# Patient Record
Sex: Female | Born: 1940 | Race: White | Hispanic: No | Marital: Married | Smoking: Former smoker
Health system: Southern US, Community
[De-identification: ages and names within clinical notes are randomized; demographics above are authoritative.]

## PROBLEM LIST (undated history)

## (undated) DIAGNOSIS — R232 Flushing: Secondary | ICD-10-CM

## (undated) DIAGNOSIS — Z96659 Presence of unspecified artificial knee joint: Secondary | ICD-10-CM

## (undated) DIAGNOSIS — Z9889 Other specified postprocedural states: Secondary | ICD-10-CM

## (undated) DIAGNOSIS — J189 Pneumonia, unspecified organism: Secondary | ICD-10-CM

## (undated) DIAGNOSIS — D649 Anemia, unspecified: Secondary | ICD-10-CM

## (undated) DIAGNOSIS — E785 Hyperlipidemia, unspecified: Secondary | ICD-10-CM

## (undated) DIAGNOSIS — C3490 Malignant neoplasm of unspecified part of unspecified bronchus or lung: Secondary | ICD-10-CM

## (undated) DIAGNOSIS — Z96649 Presence of unspecified artificial hip joint: Secondary | ICD-10-CM

## (undated) DIAGNOSIS — F419 Anxiety disorder, unspecified: Secondary | ICD-10-CM

## (undated) DIAGNOSIS — J302 Other seasonal allergic rhinitis: Secondary | ICD-10-CM

## (undated) DIAGNOSIS — M1990 Unspecified osteoarthritis, unspecified site: Secondary | ICD-10-CM

## (undated) DIAGNOSIS — K8020 Calculus of gallbladder without cholecystitis without obstruction: Secondary | ICD-10-CM

## (undated) DIAGNOSIS — H269 Unspecified cataract: Secondary | ICD-10-CM

## (undated) DIAGNOSIS — D7589 Other specified diseases of blood and blood-forming organs: Secondary | ICD-10-CM

## (undated) DIAGNOSIS — F17200 Nicotine dependence, unspecified, uncomplicated: Secondary | ICD-10-CM

## (undated) DIAGNOSIS — M8580 Other specified disorders of bone density and structure, unspecified site: Secondary | ICD-10-CM

## (undated) DIAGNOSIS — Z8669 Personal history of other diseases of the nervous system and sense organs: Secondary | ICD-10-CM

## (undated) DIAGNOSIS — K589 Irritable bowel syndrome without diarrhea: Secondary | ICD-10-CM

## (undated) DIAGNOSIS — F329 Major depressive disorder, single episode, unspecified: Secondary | ICD-10-CM

## (undated) DIAGNOSIS — I10 Essential (primary) hypertension: Secondary | ICD-10-CM

## (undated) DIAGNOSIS — B019 Varicella without complication: Secondary | ICD-10-CM

## (undated) DIAGNOSIS — K7689 Other specified diseases of liver: Secondary | ICD-10-CM

## (undated) DIAGNOSIS — R112 Nausea with vomiting, unspecified: Secondary | ICD-10-CM

## (undated) DIAGNOSIS — J449 Chronic obstructive pulmonary disease, unspecified: Secondary | ICD-10-CM

## (undated) DIAGNOSIS — Z9289 Personal history of other medical treatment: Secondary | ICD-10-CM

## (undated) DIAGNOSIS — F32A Depression, unspecified: Secondary | ICD-10-CM

## (undated) HISTORY — DX: Hyperlipidemia, unspecified: E78.5

## (undated) HISTORY — DX: Nicotine dependence, unspecified, uncomplicated: F17.200

## (undated) HISTORY — DX: Depression, unspecified: F32.A

## (undated) HISTORY — PX: COLONOSCOPY: SHX174

## (undated) HISTORY — PX: DILATION AND CURETTAGE OF UTERUS: SHX78

## (undated) HISTORY — DX: Personal history of other medical treatment: Z92.89

## (undated) HISTORY — DX: Presence of unspecified artificial hip joint: Z96.649

## (undated) HISTORY — DX: Calculus of gallbladder without cholecystitis without obstruction: K80.20

## (undated) HISTORY — DX: Other seasonal allergic rhinitis: J30.2

## (undated) HISTORY — DX: Varicella without complication: B01.9

## (undated) HISTORY — DX: Malignant neoplasm of unspecified part of unspecified bronchus or lung: C34.90

## (undated) HISTORY — DX: Chronic obstructive pulmonary disease, unspecified: J44.9

## (undated) HISTORY — DX: Anxiety disorder, unspecified: F41.9

## (undated) HISTORY — DX: Presence of unspecified artificial knee joint: Z96.659

## (undated) HISTORY — DX: Unspecified osteoarthritis, unspecified site: M19.90

## (undated) HISTORY — PX: CARPAL TUNNEL RELEASE: SHX101

## (undated) HISTORY — PX: OTHER SURGICAL HISTORY: SHX169

## (undated) HISTORY — DX: Major depressive disorder, single episode, unspecified: F32.9

## (undated) HISTORY — DX: Anemia, unspecified: D64.9

## (undated) HISTORY — PX: TONSILLECTOMY: SUR1361

## (undated) HISTORY — DX: Other specified diseases of blood and blood-forming organs: D75.89

## (undated) HISTORY — DX: Essential (primary) hypertension: I10

---

## 1997-10-18 ENCOUNTER — Ambulatory Visit (HOSPITAL_COMMUNITY): Admission: RE | Admit: 1997-10-18 | Discharge: 1997-10-18 | Payer: Self-pay | Admitting: Family Medicine

## 1998-08-09 HISTORY — PX: INGUINAL HERNIA REPAIR: SHX194

## 1999-08-10 HISTORY — PX: ROTATOR CUFF REPAIR: SHX139

## 2001-01-19 ENCOUNTER — Encounter: Payer: Self-pay | Admitting: Geriatric Medicine

## 2001-01-19 ENCOUNTER — Encounter: Admission: RE | Admit: 2001-01-19 | Discharge: 2001-01-19 | Payer: Self-pay | Admitting: Geriatric Medicine

## 2001-07-03 ENCOUNTER — Encounter: Payer: Self-pay | Admitting: Obstetrics and Gynecology

## 2001-07-03 ENCOUNTER — Encounter: Admission: RE | Admit: 2001-07-03 | Discharge: 2001-07-03 | Payer: Self-pay | Admitting: Obstetrics and Gynecology

## 2001-07-05 ENCOUNTER — Encounter: Admission: RE | Admit: 2001-07-05 | Discharge: 2001-07-05 | Payer: Self-pay | Admitting: Obstetrics and Gynecology

## 2001-07-05 ENCOUNTER — Encounter: Payer: Self-pay | Admitting: Obstetrics and Gynecology

## 2001-08-09 HISTORY — PX: TOTAL ABDOMINAL HYSTERECTOMY: SHX209

## 2001-08-30 ENCOUNTER — Ambulatory Visit (HOSPITAL_COMMUNITY): Admission: RE | Admit: 2001-08-30 | Discharge: 2001-08-30 | Payer: Self-pay | Admitting: Obstetrics and Gynecology

## 2002-02-13 ENCOUNTER — Encounter: Admission: RE | Admit: 2002-02-13 | Discharge: 2002-02-13 | Payer: Self-pay | Admitting: Obstetrics and Gynecology

## 2002-02-13 ENCOUNTER — Encounter: Payer: Self-pay | Admitting: Obstetrics and Gynecology

## 2002-07-19 ENCOUNTER — Inpatient Hospital Stay (HOSPITAL_COMMUNITY): Admission: RE | Admit: 2002-07-19 | Discharge: 2002-07-21 | Payer: Self-pay | Admitting: Obstetrics and Gynecology

## 2002-07-19 ENCOUNTER — Encounter (INDEPENDENT_AMBULATORY_CARE_PROVIDER_SITE_OTHER): Payer: Self-pay | Admitting: Specialist

## 2003-04-04 ENCOUNTER — Ambulatory Visit (HOSPITAL_COMMUNITY): Admission: RE | Admit: 2003-04-04 | Discharge: 2003-04-04 | Payer: Self-pay | Admitting: Obstetrics and Gynecology

## 2003-04-04 ENCOUNTER — Encounter: Payer: Self-pay | Admitting: Obstetrics and Gynecology

## 2008-05-13 ENCOUNTER — Encounter: Admission: RE | Admit: 2008-05-13 | Discharge: 2008-05-13 | Payer: Self-pay | Admitting: Family Medicine

## 2008-05-30 ENCOUNTER — Ambulatory Visit (HOSPITAL_COMMUNITY): Admission: RE | Admit: 2008-05-30 | Discharge: 2008-05-30 | Payer: Self-pay | Admitting: Family Medicine

## 2009-01-09 ENCOUNTER — Encounter: Admission: RE | Admit: 2009-01-09 | Discharge: 2009-01-09 | Payer: Self-pay | Admitting: Gastroenterology

## 2009-02-13 ENCOUNTER — Encounter: Admission: RE | Admit: 2009-02-13 | Discharge: 2009-03-04 | Payer: Self-pay | Admitting: Sports Medicine

## 2009-06-09 ENCOUNTER — Encounter: Admission: RE | Admit: 2009-06-09 | Discharge: 2009-06-09 | Payer: Self-pay | Admitting: Surgery

## 2009-06-17 ENCOUNTER — Encounter: Admission: RE | Admit: 2009-06-17 | Discharge: 2009-06-17 | Payer: Self-pay | Admitting: Surgery

## 2009-07-29 ENCOUNTER — Encounter: Admission: RE | Admit: 2009-07-29 | Discharge: 2009-07-29 | Payer: Self-pay | Admitting: Sports Medicine

## 2009-08-09 HISTORY — PX: TOTAL HIP ARTHROPLASTY: SHX124

## 2009-09-09 ENCOUNTER — Inpatient Hospital Stay (HOSPITAL_COMMUNITY): Admission: RE | Admit: 2009-09-09 | Discharge: 2009-09-13 | Payer: Self-pay | Admitting: Orthopaedic Surgery

## 2009-09-22 ENCOUNTER — Ambulatory Visit: Payer: Self-pay | Admitting: Vascular Surgery

## 2009-09-22 ENCOUNTER — Ambulatory Visit: Admission: RE | Admit: 2009-09-22 | Discharge: 2009-09-22 | Payer: Self-pay | Admitting: Orthopaedic Surgery

## 2009-09-22 ENCOUNTER — Encounter (INDEPENDENT_AMBULATORY_CARE_PROVIDER_SITE_OTHER): Payer: Self-pay | Admitting: Orthopaedic Surgery

## 2009-10-13 ENCOUNTER — Encounter: Admission: RE | Admit: 2009-10-13 | Discharge: 2009-10-23 | Payer: Self-pay | Admitting: Orthopaedic Surgery

## 2010-02-06 ENCOUNTER — Encounter: Admission: RE | Admit: 2010-02-06 | Discharge: 2010-02-06 | Payer: Self-pay | Admitting: Surgery

## 2010-06-05 ENCOUNTER — Ambulatory Visit (HOSPITAL_COMMUNITY): Admission: RE | Admit: 2010-06-05 | Discharge: 2010-06-05 | Payer: Self-pay | Admitting: Family Medicine

## 2010-06-22 ENCOUNTER — Encounter: Admission: RE | Admit: 2010-06-22 | Discharge: 2010-06-22 | Payer: Self-pay | Admitting: Family Medicine

## 2010-08-30 ENCOUNTER — Encounter: Payer: Self-pay | Admitting: Surgery

## 2010-10-25 LAB — CROSSMATCH
ABO/RH(D): A POS
Antibody Screen: NEGATIVE

## 2010-10-25 LAB — DIFFERENTIAL
Basophils Relative: 1 % (ref 0–1)
Eosinophils Absolute: 0 10*3/uL (ref 0.0–0.7)
Lymphs Abs: 1.6 10*3/uL (ref 0.7–4.0)
Monocytes Relative: 7 % (ref 3–12)
Neutro Abs: 4.9 10*3/uL (ref 1.7–7.7)
Neutrophils Relative %: 69 % (ref 43–77)

## 2010-10-25 LAB — ABO/RH: ABO/RH(D): A POS

## 2010-10-25 LAB — URINALYSIS, ROUTINE W REFLEX MICROSCOPIC
Hgb urine dipstick: NEGATIVE
Nitrite: NEGATIVE
Protein, ur: 30 mg/dL — AB
Urobilinogen, UA: 1 mg/dL (ref 0.0–1.0)

## 2010-10-25 LAB — APTT: aPTT: 27 seconds (ref 24–37)

## 2010-10-25 LAB — COMPREHENSIVE METABOLIC PANEL
ALT: 39 U/L — ABNORMAL HIGH (ref 0–35)
BUN: 6 mg/dL (ref 6–23)
CO2: 25 mEq/L (ref 19–32)
Calcium: 8.8 mg/dL (ref 8.4–10.5)
GFR calc non Af Amer: >60 mL/min (ref >60)
Glucose, Bld: 107 mg/dL — ABNORMAL HIGH (ref 70–99)
Sodium: 131 mEq/L — ABNORMAL LOW (ref 135–145)
Total Protein: 6.8 g/dL (ref 6.0–8.3)

## 2010-10-25 LAB — CBC
HCT: 42.8 % (ref 36.0–46.0)
Hemoglobin: 15 g/dL (ref 12.0–15.0)
MCHC: 35.1 g/dL (ref 30.0–36.0)
MCV: 96.9 fL (ref 78.0–100.0)
RBC: 4.42 MIL/uL (ref 3.87–5.11)
RDW: 13.1 % (ref 11.5–15.5)

## 2010-10-25 LAB — PROTIME-INR
INR: 0.99 (ref 0.00–1.49)
Prothrombin Time: 13 seconds (ref 11.6–15.2)

## 2010-10-28 LAB — BASIC METABOLIC PANEL
BUN: 4 mg/dL — ABNORMAL LOW (ref 6–23)
CO2: 27 mEq/L (ref 19–32)
CO2: 27 mEq/L (ref 19–32)
Calcium: 7.8 mg/dL — ABNORMAL LOW (ref 8.4–10.5)
Chloride: 100 mEq/L (ref 96–112)
Chloride: 103 mEq/L (ref 96–112)
Chloride: 98 mEq/L (ref 96–112)
GFR calc Af Amer: >60 mL/min (ref >60)
GFR calc non Af Amer: >60 mL/min (ref >60)
Glucose, Bld: 100 mg/dL — ABNORMAL HIGH (ref 70–99)
Potassium: 3.8 mEq/L (ref 3.5–5.1)
Potassium: 4.1 mEq/L (ref 3.5–5.1)
Sodium: 131 mEq/L — ABNORMAL LOW (ref 135–145)
Sodium: 132 mEq/L — ABNORMAL LOW (ref 135–145)
Sodium: 134 mEq/L — ABNORMAL LOW (ref 135–145)

## 2010-10-28 LAB — CBC
HCT: 29.5 % — ABNORMAL LOW (ref 36.0–46.0)
HCT: 32.3 % — ABNORMAL LOW (ref 36.0–46.0)
Hemoglobin: 10.1 g/dL — ABNORMAL LOW (ref 12.0–15.0)
Hemoglobin: 10.7 g/dL — ABNORMAL LOW (ref 12.0–15.0)
Hemoglobin: 11.1 g/dL — ABNORMAL LOW (ref 12.0–15.0)
MCHC: 34.4 g/dL (ref 30.0–36.0)
MCHC: 34.5 g/dL (ref 30.0–36.0)
MCV: 97.2 fL (ref 78.0–100.0)
MCV: 97.8 fL (ref 78.0–100.0)
Platelets: 183 10*3/uL (ref 150–400)
RBC: 3.19 MIL/uL — ABNORMAL LOW (ref 3.87–5.11)
RDW: 12.8 % (ref 11.5–15.5)
WBC: 10.1 10*3/uL (ref 4.0–10.5)
WBC: 12.4 10*3/uL — ABNORMAL HIGH (ref 4.0–10.5)

## 2010-10-28 LAB — PROTIME-INR
INR: 1.54 — ABNORMAL HIGH (ref 0.00–1.49)
INR: 1.75 — ABNORMAL HIGH (ref 0.00–1.49)
Prothrombin Time: 20.3 seconds — ABNORMAL HIGH (ref 11.6–15.2)

## 2010-10-28 LAB — GLUCOSE, CAPILLARY

## 2010-12-24 ENCOUNTER — Ambulatory Visit
Admission: RE | Admit: 2010-12-24 | Discharge: 2010-12-24 | Disposition: A | Discharge status: Home / Self Care | Payer: Medicare Other | Source: Ambulatory Visit | Attending: Ophthalmology | Admitting: Ophthalmology

## 2010-12-24 ENCOUNTER — Other Ambulatory Visit: Payer: Self-pay | Admitting: Ophthalmology

## 2010-12-24 DIAGNOSIS — D869 Sarcoidosis, unspecified: Secondary | ICD-10-CM

## 2010-12-25 NOTE — Discharge Summary (Signed)
NAME:  [name], [name]                         ACCOUNT NO.:  [id number]   MEDICAL RECORD NO.:  [id number]                   PATIENT TYPE:  INP   LOCATION:  0457                                 FACILITY:  [facility]   PHYSICIAN:  Artist Pais, M.D.                 DATE OF BIRTH:  1940-10-05   DATE OF ADMISSION:  07/19/2002  DATE OF DISCHARGE:  07/21/2002                                 DISCHARGE SUMMARY   HISTORY OF PRESENT ILLNESS:  The patient is a 70 year old Caucasian female  who was admitted for total abdominal hysterectomy and bilateral salpingo-  oophorectomy for a known fibroid approximately 6 cm in diameter.  She  continued to have irregular bleeding which is post-menopausal bleeding which  has been worked up with a D&C hysteroscopy, and she was found to have an  endometrial polyp with subsequent resolution of her bleeding.  However,  subsequently she began to have post-menopausal bleeding again, and she  desired total abdominal hysterectomy and bilateral salpingo-oophorectomy due  to the large fibroid.  She was concerned that her ovary could not be  definitively identified, and she had been desiring removal of the fibroid  uterus for quite some time, but has been unable to undergo the surgery due  to constraints of her job.  For further details, please see the dictated  history and physical.   HOSPITAL COURSE:  The patient was admitted and subsequently underwent a  total abdominal hysterectomy and bilateral salpingo-oophorectomy.  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  She also was found to have a small liver nodule as well.  Postoperatively,  the patient was receiving excellent analgesia and had excellent urine  output.  She continued to do well, and subsequently was discharged home on  postoperative day #2.  She was found to have no CVA tenderness and no calf  tenderness.  Incision was noted to be clean, dry, and intact with some  slight bruising.  She was given extensive discharge instructions, and did  have return of her bowel function with positive flatus, and she was able to  tolerate a regular diet.  She was instructed that we would work up the liver  nodule later.  She was given a prescription for Tylox one or two q.3-4h.  p.r.n. pain, to take ibuprofen 400 to 600 mg q.6h. p.r.n. pain.  She was  urged to hold her Norvasc for now, and to call her blood pressures to [place] in  two days at the office to restart her antihypertensive agent.  She was also  urged to continue her Vivelle Dot 0.05 mg twice weekly, and was given a one  month supply with four refills.  She did well with her nicotine patch in the  hospital, and was given this.  She was urged to  return in a week for staple  removal, and to call for any problems.                                               Artist Pais, M.D.    DC/MEDQ  D:  08/12/2002  T:  08/12/2002  Job:  [id number]   cc:   Hal T. Stoneking, M.D.  [address] [place], [place] [address]  Fax: [phone]

## 2010-12-25 NOTE — Op Note (Signed)
NAMEMarland Kitchen  [name], [name]                         ACCOUNT NO.:  [id number]   MEDICAL RECORD NO.:  [id number]                   PATIENT TYPE:  INP   LOCATION:  X003                                 FACILITY:  [facility]   PHYSICIAN:  Artist Pais, M.D.                 DATE OF BIRTH:  1941-07-21   DATE OF PROCEDURE:  07/19/2002  DATE OF DISCHARGE:                                 OPERATIVE REPORT   PREOPERATIVE DIAGNOSES:  1. Left adnexal mass thought to be uterine fibroid.  2. Postmenopausal bleeding.   POSTOPERATIVE DIAGNOSES:  1. Postmenopausal bleeding.  2. 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   PROCEDURE:  Total abdominal hysterectomy, bilateral salpingo-oophorectomy.   SURGEON:  Artist Pais, M.D.   ASSISTANT:  Georgina Peer, M.D.   ANESTHESIA:  General endotracheal.   FLUIDS REPLACED:  1700 cc crystalloids.   ANTIBIOTICS:  Ciprofloxacin preoperative antibiotics.   DRAINS:  Foley.   COMPLICATIONS:  None.   ESTIMATED BLOOD LOSS:  150 cc.   FINDINGS:  No fibroid noted.  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   DESCRIPTION OF PROCEDURE:  The patient was brought to the operating room,  identified on the operating room table.  After induction of adequate general  endotracheal anesthesia, the patient was placed in the supine position and  prepped and draped in the usual sterile fashion.  A Foley catheter was  placed.  A small Pfannenstiel incision was made and carried down to the  fascia.  The fascia was scored in the midline, extended bilaterally using  the Mayo scissors.  This was then separated free from the underlying  muscles.  The muscles were separated in the midline down to the symphysis.  The patient was placed in Trendelenburg and the perineum was entered sharply  and carefully, taking care to avoid bowel or other abdominal contents.  The  washings were taken, and the abdomen was explored.  There  was noted to be a  solitary nodule over the right anterior surface of the liver, otherwise  totally negative.  There were no enlarged pelvic or para-aortic nodes.  There was noted to be a small uterus, and the adnexal mass was found to be a  firm, large ovarian mass.  The O'Connor-O'Sullivan retractor was placed, the  bladder blade was placed, and the bowel was packed out of the operative  field using three wet packs, and an abdominal wall retractor was placed.  Kelly clamps were placed at either angle of the uterus, and the left round  ligament was ligated with a figure-of-eight suture of 0 Vicryl, divided, and  the anterior leaf of the broad ligament was divided to the area overlying  the internal os.  A clear space was developed and the infundibulopelvic  ligament was clamped with a curved Heaney clamp, cut, tied with a  free tie,  then a suture ligature of 0 Vicryl.  A Kelly clamp was placed across the  proximal portion of the fallopian tube and the utero-ovarian ligament, which  was then cut, removed, and sent to pathology for frozen section, and this  was tied with a free tie of 0 Vicryl.  In a similar fashion the right round  ligament was ligated with a figure-of-eight suture of 0 Vicryl, divided with  cautery, and then taken down with Metzenbaum scissors.  The anterior leaf of  the broad ligament was taken down to the area overlying the internal os.  The clear space was developed and the infundibulopelvic ligament on the  right was clamped, cut, and tied with a free tie, then a suture ligature of  0 Vicryl.  The ureters were easily identified and noted to be well away from  all pedicles.  Subsequently the vessels were skeletonized on the right.  The  bladder was taken down with both sharp and blunt dissection.  A curved  Heaney was placed across the uterine vessels on the right.  An additional  clamp was placed for back bleeding.  This pedicle was then cut and suture  ligated with a  figure-of-eight suture of 0 Vicryl.  This was accomplished on  the left in a similar fashion.  Subsequent cardinal ligament-uterosacral  ligament bites were taken using straight Heaneys with the pedicles clamped,  cut, and suture ligated using sutures of 0 Vicryl.  The uterosacral ligament  on the left was clamped with a curved Heaney, cut, and the vagina was then  entered.  A figure-of-eight suture was placed of 0 Vicryl.  The cardinal  ligament complex was clamped on the right with a straight Heaney, cut, and  suture ligated using a suture of 0 Vicryl.  Subsequently a curved Heaney was  placed across the uterosacral ligament, which was then cut and suture  ligated using a figure-of-eight suture of 0 Vicryl as the vagina was then  entered.  Using Satinsky scissors, the cervix was separated from the vagina  and the entire specimen was sent to pathology for examination.  At that  point Dr. Almyra Free called to notify us that this was a Darnelle Bos cell tumor,  which is, fortunately, a benign tumor.  Subsequently figure-of-eight sutures  were placed of 0 Vicryl to close the vaginal cuff.  Using an Asepto syringe,  the pedicles were all irrigated with warm lactated Ringer's.  There was  noted to be an oozing area at the cardinal ligament complex on the right.  This was suture ligated with a superficial suture of 2-0 Vicryl on a GI  needle with excellent hemostasis noted.  The pelvis was then copiously  irrigated with warm lactated Ringer's and excellent hemostasis was again  noted at all cut surfaces and all pedicles.  At that point the patient was  taken out of Trendelenburg, the packs were removed, the O'Connor-O'Sullivan  retractor was removed as well as the bladder retractor and the abdominal  wall retractor, and it also should be noted that the appendix was noted to  be normal as well.  Kelly clamps were placed on the peritoneum.  The  peritoneum was closed using simple running suture of 2-0  Vicryl.  The subfascial areas were examined for evidence of hemostasis, excellent  hemostasis achieved using cautery.  The fascia was closed using two sutures  of 0 PDS with each suture anchored at either end of the incision and run to  the midline, and tied.  The subcutaneous tissue was then irrigated copiously  with warm  lactated Ringer's and excellent hemostasis achieved using cautery.  The skin  was closed with staples, and Steri-Strips were applied.  The patient  tolerated the procedure well without apparent complications, was transferred  to the recovery room in stable condition after all instrument, sponge, and  needle counts were correct.                                                 Artist Pais, M.D.    DC/MEDQ  D:  07/19/2002  T:  07/19/2002  Job:  [id number]   cc:   Hal T. Stoneking, M.D.  [address]  [place], [place] [address]  Fax: [phone]   Georgina Peer, M.D.  [phone]  [place]  [place] [address]  Fax: [phone]

## 2010-12-25 NOTE — H&P (Signed)
NAME:  [name], [name]                         ACCOUNT NO.:  [id number]   MEDICAL RECORD NO.:  [id number]                   PATIENT TYPE:  INP   LOCATION:  NA                                   [facility]ACILITY[facility]   PHYSICIAN:  Artist Pais, M.D.                 DATE OF BIRTH:  [birth year]   DATE OF ADMISSION:  07/19/2002  DATE OF DISCHARGE:                                HISTORY & PHYSICAL   CURRENT HISTORY:  The patient is a 70 year old Caucasian female initially  referred July 04, 2001 by Dr. Ann Maki T. Stoneking for postmenopausal  bleeding.  She became menopausal at age 35.  She is a gravida 2, para 1.  She had a uterine polyp removed in November of 2000 in [place] and began  having vaginal bleeding, June 11, 1999 to June 20, 1999.  She  subsequently underwent a D&C/hysteroscopy for an endometrial polyp with  subsequent resolution of her bleeding.  However, the patient has a known  fibroid, approximately 6 cm in diameter, with some continued irregular  bleeding.  It is thought that the irregular bleeding is likely due to the  fibroid uterus, as her pathology was benign, deemed to hysteroscopy.  She  desires total abdominal hysterectomy and bilateral salpingo-oophorectomy due  to the large fibroid.  She is concerned that her ovary cannot be  definitively identified and has been desiring removal of this fibroid uterus  for quite some time but has been unable to undergo the surgery due to the  constraints of her job.  She recently has been diagnosed with lactose  intolerance and found to have irritable bowel syndrome.  Risks of surgery  including anesthetic complication, hemorrhage or infection, damage to  adjacent structures including bladder, bowel, blood vessels or ureter were  discussed with the patient.  She was made aware that with her large fibroid,  damage to adjacent structures was much more likely and for that reason, she  has undergone a bowel prep.  Postoperative  risks were discussed with the  patient to include wound infection, urinary tract infection, atelectasis,  pneumonia, DVT which could result in stroke or pulmonary embolism.  The  patient is admitted for a TAH/BSO due to the patient's concern that the  large fibroid interferes with evaluation of her adnexa as this has been  found to be a left adnexal mass that is a probable uterine fibroid but  ovaries cannot be palpated.  In addition, she continues to have some  episodes of postmenopausal bleeding thought to be due to the fibroid.   PAST MEDICAL HISTORY:  Left retinal hemorrhage, history of endometrial  polyps, depression, vasomotor symptoms, hypertension, arthritis,  fibromyalgia, seasonal allergies and history of pneumonia, December of 1999,  hospitalized in [place], most recently diagnosed with irritable bowel  syndrome.   ALLERGIES:  PENICILLIN and SULFA cause rash or hives; the patient is also  allergic to  RED DYE.   CURRENT MEDICATIONS:  Norvasc, Tylenol, Allegra, Prozac, a muscle relaxant  and Lactaid.   PAST SURGICAL HISTORY:  Rotator cuff surgery, April of 2001, [facility], November  of 2000, hernia repair, May of 2000, and carpal tunnel release in the mid-  1990s.   FAMILY HISTORY:  There is no family history of colon, breast, ovarian or  prostate cancer.  The patient's mother died at 74.  Father died at 33 of  hypertension and heart disease.  One brother, age 78, with hypertension.  No  children.  There is a family history of Crohn's disease in a paternal cousin  and also a family history of diverticular disease.  The patient's father  also manifested diverticular disease.   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  She is married.  She smokes 1 pack of cigarettes per day for  the last 46 years, uses occasionally wine.  She is a native of [place]   REVIEW OF SYSTEMS:  Review of systems noncontributory except as noted  above.  Denies headaches, visual changes, chest pain, shortness of breath, abdominal  pain, unintentional weight loss, dysuria, urgency, frequency, vaginal  __________ or discharge, pain or bleeding with intercourse.  Recently, the  patient has had chronic diarrhea which has been completely worked up.   PHYSICAL EXAMINATION:  GENERAL APPEARANCE:  Well-developed Caucasian female.  VITAL SIGNS:  Blood pressure 110/70.  Weight 134.  HEENT:  Normal.  NECK:  Neck supple without thyromegaly, adenopathy or nodules.  LUNGS:  Lungs clear to auscultation.  CARDIAC:  Regular rate and rhythm.  ABDOMEN:  Abdomen soft, nontender.  No hepatosplenomegaly or masses.  EXTREMITIES:  No cyanosis, clubbing or edema.  NEUROLOGIC:  Oriented x3.  Grossly normal.  PELVIC:  Normal external female genitalia.  No vulvovaginal or cervical  lesions.  Pap smear performed a year ago showed benign reactive changes.  Bimanual examination reveals the uterus to be mobile and nontender with a  large fibroid palpated.  RECTAL:  Excellent sphincter tone.  Confirms pelvic exam.  No masses  palpated.   IMPRESSION AND PLAN:  The patient is a 70 year old Caucasian female with  episodes of postmenopausal bleeding completely worked up with benign  pathology, with a 6-cm left adnexal mass which is likely to be a uterine  fibroid.  This has not changed in measurements, but the patient desires to  have this performed as she is concerned that it interferes with evaluation  of the left adnexa.  In addition, this is thought to be submucosal, as she  continues to have episodes of postmenopausal bleeding.  Risks of the surgery  have been explained to the patient and she expresses understanding of and  acceptance of these risks and desires to proceed with surgery.                                               Artist Pais, M.D.   DC/MEDQ  D:  07/18/2002  T:  07/19/2002  Job:  [id number]

## 2011-04-09 ENCOUNTER — Other Ambulatory Visit: Payer: Self-pay | Admitting: Family Medicine

## 2011-04-09 DIAGNOSIS — K7689 Other specified diseases of liver: Secondary | ICD-10-CM

## 2011-04-14 ENCOUNTER — Ambulatory Visit
Admission: RE | Admit: 2011-04-14 | Discharge: 2011-04-14 | Disposition: A | Discharge status: Home / Self Care | Payer: Medicare Other | Source: Ambulatory Visit | Attending: Family Medicine | Admitting: Family Medicine

## 2011-04-14 DIAGNOSIS — K7689 Other specified diseases of liver: Secondary | ICD-10-CM

## 2011-05-25 ENCOUNTER — Other Ambulatory Visit (HOSPITAL_COMMUNITY): Payer: Self-pay | Admitting: Family Medicine

## 2011-05-25 DIAGNOSIS — Z1231 Encounter for screening mammogram for malignant neoplasm of breast: Secondary | ICD-10-CM

## 2011-06-03 ENCOUNTER — Other Ambulatory Visit (HOSPITAL_COMMUNITY): Payer: Self-pay | Admitting: Family Medicine

## 2011-06-10 ENCOUNTER — Ambulatory Visit (HOSPITAL_COMMUNITY)
Admission: RE | Admit: 2011-06-10 | Discharge: 2011-06-10 | Disposition: A | Discharge status: Home / Self Care | Payer: Medicare Other | Source: Ambulatory Visit | Attending: Family Medicine | Admitting: Family Medicine

## 2011-06-10 DIAGNOSIS — Z1231 Encounter for screening mammogram for malignant neoplasm of breast: Secondary | ICD-10-CM

## 2011-07-21 ENCOUNTER — Telehealth: Payer: Self-pay | Admitting: Internal Medicine

## 2011-07-21 NOTE — Telephone Encounter (Signed)
Called pt , left message to call us to confirm appt, waiting for a call back

## 2011-07-23 ENCOUNTER — Telehealth: Payer: Self-pay | Admitting: Internal Medicine

## 2011-07-23 NOTE — Telephone Encounter (Signed)
Talked to pt's husband, gave the address and telephone number, per pt rqst

## 2011-07-30 DIAGNOSIS — H3090 Unspecified chorioretinal inflammation, unspecified eye: Secondary | ICD-10-CM | POA: Insufficient documentation

## 2011-07-30 DIAGNOSIS — H44119 Panuveitis, unspecified eye: Secondary | ICD-10-CM | POA: Insufficient documentation

## 2011-08-04 ENCOUNTER — Ambulatory Visit: Payer: Medicare Other | Admitting: Internal Medicine

## 2011-08-10 ENCOUNTER — Other Ambulatory Visit: Payer: Self-pay | Admitting: Internal Medicine

## 2011-08-10 DIAGNOSIS — D649 Anemia, unspecified: Secondary | ICD-10-CM

## 2011-08-11 ENCOUNTER — Encounter: Payer: Self-pay | Admitting: Internal Medicine

## 2011-08-11 ENCOUNTER — Ambulatory Visit: Payer: Medicare Other

## 2011-08-11 ENCOUNTER — Ambulatory Visit (HOSPITAL_BASED_OUTPATIENT_CLINIC_OR_DEPARTMENT_OTHER): Payer: Medicare Other | Admitting: Internal Medicine

## 2011-08-11 ENCOUNTER — Other Ambulatory Visit (HOSPITAL_BASED_OUTPATIENT_CLINIC_OR_DEPARTMENT_OTHER): Payer: Medicare Other

## 2011-08-11 VITALS — BP 132/84 | HR 86 | Temp 97.0°F | Ht 62.0 in | Wt 129.2 lb

## 2011-08-11 DIAGNOSIS — K589 Irritable bowel syndrome without diarrhea: Secondary | ICD-10-CM

## 2011-08-11 DIAGNOSIS — I10 Essential (primary) hypertension: Secondary | ICD-10-CM

## 2011-08-11 DIAGNOSIS — D7589 Other specified diseases of blood and blood-forming organs: Secondary | ICD-10-CM

## 2011-08-11 DIAGNOSIS — J449 Chronic obstructive pulmonary disease, unspecified: Secondary | ICD-10-CM

## 2011-08-11 DIAGNOSIS — F17200 Nicotine dependence, unspecified, uncomplicated: Secondary | ICD-10-CM

## 2011-08-11 DIAGNOSIS — D649 Anemia, unspecified: Secondary | ICD-10-CM

## 2011-08-11 LAB — CBC WITH DIFFERENTIAL/PLATELET
Basophils Absolute: 0 10*3/uL (ref 0.0–0.1)
EOS%: 1.4 % (ref 0.0–7.0)
Eosinophils Absolute: 0.1 10*3/uL (ref 0.0–0.5)
HCT: 42.6 % (ref 34.8–46.6)
HGB: 14.8 g/dL (ref 11.6–15.9)
MONO#: 0.6 10*3/uL (ref 0.1–0.9)
NEUT#: 3.6 10*3/uL (ref 1.5–6.5)
NEUT%: 62.1 % (ref 38.4–76.8)
RDW: 12.3 % (ref 11.2–14.5)
WBC: 5.8 10*3/uL (ref 3.9–10.3)
lymph#: 1.5 10*3/uL (ref 0.9–3.3)

## 2011-08-11 LAB — COMPREHENSIVE METABOLIC PANEL
Albumin: 4.8 g/dL (ref 3.5–5.2)
Alkaline Phosphatase: 50 U/L (ref 39–117)
BUN: 7 mg/dL (ref 6–23)
Calcium: 10 mg/dL (ref 8.4–10.5)
Chloride: 99 mEq/L (ref 96–112)
Glucose, Bld: 90 mg/dL (ref 70–99)
Potassium: 4 mEq/L (ref 3.5–5.3)
Sodium: 137 mEq/L (ref 135–145)
Total Protein: 6.6 g/dL (ref 6.0–8.3)

## 2011-08-11 NOTE — Progress Notes (Signed)
REASON FOR CONSULTATION:  71 years old white female with macrocytosis.  HPI [name] is a 71 years old female past medical history significant for hypertension, degenerative arthritis, COPD, and irritable bowel syndrome. The patient was seen recently by her primary care physician Dr. Jackalyn Lombard. 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 Dr. Ludwig Clarks in ordered serum B12 as well as folic acid levels in addition to TSH. These were within in the normal range. She referred the patient to me today for further evaluation and recommendation regarding her macrocytosis. The patient is feeling fine she denied having any specific complaints. 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 The patient was also treated recently with Z-Pak for cold symptoms and bronchitis. Otherwise she is feeling fine. She continues to have mild cough and shortness of breath with exertion secondary to her bronchitis. No significant chest pain, no weight loss or night sweats. No bruises or bleeding issues, no palpable lymphadenopathy. She has repeat CBC earlier today at the cancer Center that showed normocytic erythrocytes was no evidence of anemia or other abnormalities. @SFHPI @  Past Medical History  Diagnosis Date  . Arthritis   . COPD (chronic obstructive pulmonary disease)   . Depression   . Anxiety   . Hypertension   . Gout   . Hepatic cyst   . Migraine   . OCD (obsessive compulsive disorder)   . Osteoporosis   . Tinea corporis     History reviewed. No pertinent past surgical history.  History reviewed. No pertinent family history.  Social History History  Substance Use Topics  . Smoking status: Current Everyday Smoker -- 0.5 packs/day for 50 years    Types: Cigarettes  . Smokeless tobacco: Not on file  . Alcohol Use: 1.8 oz/week    3 Cans of beer per week    Allergies  Allergen Reactions  . Clindamycin/Lincomycin Nausea And  Vomiting  . Penicillins   . Sulfa Antibiotics Rash    Current Outpatient Prescriptions  Medication Sig Dispense Refill  . acetaminophen (TYLENOL) 650 MG CR tablet Take 1,300 mg by mouth 2 (two) times daily.        . Albuterol Sulfate (PROAIR HFA IN) Inhale into the lungs as needed.        Marland Kitchen amLODipine (NORVASC) 5 MG tablet Take 5 mg by mouth daily.        . Calcium Carbonate-Vitamin D (CALCIUM + D PO) Take by mouth daily. 500+400       . diphenhydrAMINE (BENADRYL) 50 MG tablet Take 50 mg by mouth as needed.        . fish oil-omega-3 fatty acids 1000 MG capsule Take 1,200 mg by mouth daily.        Marland Kitchen FLUoxetine (PROZAC) 20 MG tablet Take 20 mg by mouth daily.        . Glucosamine 500 MG CAPS Take 2 capsules by mouth 2 (two) times daily.        . Lactobacillus (ACIDOPHILUS PO) Take by mouth as needed.        . Oxymetazoline HCl (NASAL SPRAY NA) Place into the nose. fluticazone       . triamcinolone cream (KENALOG) 0.1 % Apply topically as needed.          Review of Systems  A comprehensive review of systems was negative.  Physical Exam  NWG:NFAOZ, healthy, no distress, well nourished, well developed and anxious SKIN: skin color,  texture, turgor are normal HEAD: Normocephalic EYES: normal EARS: External ears normal OROPHARYNX:no exudate, no erythema and lips, buccal mucosa, and tongue normal  NECK: supple, no adenopathy LYMPH:  no palpable lymphadenopathy, no hepatosplenomegaly BREAST:not examined LUNGS: clear to auscultation  HEART: regular rate & rhythm, no murmurs and no gallops ABDOMEN:abdomen soft, non-tender, obese, normal bowel sounds and no masses or organomegaly EXTREMITIES:no joint deformities, effusion, or inflammation, no edema, no skin discoloration, no clubbing, no cyanosis  NEURO: alert & oriented x 3 with fluent speech, no focal motor/sensory deficits, gait normal   ASSESSMENT: This is a very pleasant 71 years old white female who presented today for evaluation  of macrocytosis, currently resolved. This was most likely secondary to walk nutritional deficiency associated with the patient is a history of irritable bowel syndrome versus viral or drug-induced especially with her recent treatment with Valtrex. Her CBC today was unremarkable for any significant abnormalities. I discussed the lab result with the patient today.  PLAN: I recommended for her continuous observation with her primary care physician. I don't see a need for further investigation for her condition but I would be happy to see her in the future if there is any other significant abnormalities. I gave the patient the time to ask questions and I answered them completely to her satisfaction.  The patient knows to call the clinic with any problems, questions or concerns. We can certainly see the patient much sooner if necessary.  Thank you so much for allowing me to participate in the care of [name]. I will continue to follow up the patient with you and assist in her care.  I spent 25 minutes counseling the patient face to face. The total time spent in the appointment was 50 minutes.   Cory Rama K. 08/11/2011, 11:10 AM

## 2011-09-08 ENCOUNTER — Encounter: Payer: Self-pay | Admitting: Internal Medicine

## 2011-09-09 ENCOUNTER — Other Ambulatory Visit (INDEPENDENT_AMBULATORY_CARE_PROVIDER_SITE_OTHER): Payer: Medicare Other

## 2011-09-09 ENCOUNTER — Ambulatory Visit (INDEPENDENT_AMBULATORY_CARE_PROVIDER_SITE_OTHER): Payer: Medicare Other | Admitting: Internal Medicine

## 2011-09-09 ENCOUNTER — Encounter: Payer: Self-pay | Admitting: Internal Medicine

## 2011-09-09 VITALS — BP 122/82 | HR 70 | Temp 98.1°F | Ht 62.0 in | Wt 131.6 lb

## 2011-09-09 DIAGNOSIS — J449 Chronic obstructive pulmonary disease, unspecified: Secondary | ICD-10-CM

## 2011-09-09 DIAGNOSIS — R911 Solitary pulmonary nodule: Secondary | ICD-10-CM

## 2011-09-09 DIAGNOSIS — F17200 Nicotine dependence, unspecified, uncomplicated: Secondary | ICD-10-CM

## 2011-09-09 LAB — BASIC METABOLIC PANEL
CO2: 31 mEq/L (ref 19–32)
Chloride: 101 mEq/L (ref 96–112)
Potassium: 4.2 mEq/L (ref 3.5–5.1)
Sodium: 139 mEq/L (ref 135–145)

## 2011-09-09 NOTE — Patient Instructions (Signed)
#  Rt lung nodule   - please have CT chest with contrast ASAP - I will call you with results #COPD  - this can explain your shortness of breath  - nurse will walk you for oxygen levels  - please have breathing test called full PFT #Smoking  - think about life without smoking and imagine if that is a life you might like #Followup  - after pft (need to have CT chest well before and asap and independent of pft date)

## 2011-09-09 NOTE — Progress Notes (Signed)
Subjective:    Patient ID: [name], female    DOB: 11-Nov-1940, 71 y.o.   MRN: [id number]  HPI IOV 09/09/2011  PMD is Dr Toula Moos. Known spring allergies, IBS, OA - knees and hip and does regular water exercises. Drinks 2 coors lite each night (CAGE -negative).  Reports that she has been smoking Cigarettes.  She has a 25 pack-year smoking history. She does not have any smokeless tobacco history on file. Says that between 1999 - 2001 and 2004-2009 traveled several countries with husband - [place], [place], [place], [place], [place].  But now back in [place] since 2009. At that time  prior PMD Dr Ursula Beath told her that she has COPD. Now PMD is Dr Ludwig Clarks since Jan 2012 and a month ago advised her to come to Pulmonary following an episode of bronchitis.   She herself feels well other than mild dyspnea with undue exertion that is relieved by rest. Walks god, yard work and cleaning home does not make her dyspneic. Walking uphill makes her dyspneic. Denies associated cough, weight loss, hemoptysis  Or CAD. However, in the office walking - she desaturated  Of note, there is SPN on imaging below but she denies knowledge of this     CT chest 09/11/2009 IMPRESSION:  1. No evidence of acute pulmonary embolus. 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  2. Emphysema. Right upper lobe pulmonary nodule is confirmed  measuring 7 x 10 mm. Recommend follow-up (outpatient) PET-CT  versus follow-up chest CTs at 3, 9and 24 months. (Guidelines for  Management of Small Pulmonary Nodules Detected on CT Scans: A  Statement from the Fleischner Society as published in Radiology  2005; 237:395-400.).  3. Stable low density lesions in the liver, favor benign etiology  such as a combination of cyst and hemangiomas. Attention directed  on follow-up.  CXR 12/24/10: 1.5cm RUL nodule   Past Medical History  Diagnosis Date  . Arthritis   . COPD (chronic  obstructive pulmonary disease)   . Depression   . Anxiety   . Hypertension   . Gout   . Hepatic cyst   . Migraine   . OCD (obsessive compulsive disorder)   . Osteoporosis   . Tinea corporis   . Acute bronchitis   . Allergic rhinitis   . Chronic diarrhea of unknown origin   . Gout   . Macrocytosis   . Skin rash   . Flushing   . Urinary system symptoms, other   . Need for prophylactic vaccination and inoculation against influenza   . Anemia   . Benign paroxysmal positional vertigo   . Hyperlipidemia      Family History  Problem Relation Age of Onset  . Alcohol abuse    . Depression    . Hearing loss    . Heart disease    . Hypertension       History   Social History  . Marital Status: Married    Spouse Name: N/A    Number of Children: N/A  . Years of Education: N/A   Occupational History  . retired    Social History Main Topics  . Smoking status: Current Everyday Smoker -- 0.5 packs/day for 50 years    Types: Cigarettes  . Smokeless tobacco: Not on file  . Alcohol Use: 1.8 oz/week    3 Cans of beer per week     social  . Drug Use: No  . Sexually Active:  Not on file   Other Topics Concern  . Not on file   Social History Narrative  . No narrative on file     Allergies  Allergen Reactions  . Clindamycin/Lincomycin Nausea And Vomiting  . Penicillins   . Sulfa Antibiotics Rash     Outpatient Prescriptions Prior to Visit  Medication Sig Dispense Refill  . acetaminophen (TYLENOL) 650 MG CR tablet Take 1,300 mg by mouth 2 (two) times daily.        . Albuterol Sulfate (PROAIR HFA IN) Inhale into the lungs as needed.        Marland Kitchen amLODipine (NORVASC) 5 MG tablet Take 5 mg by mouth daily.        . Calcium Carbonate-Vitamin D (CALCIUM + D PO) Take by mouth daily. 500+400       . diphenhydrAMINE (BENADRYL) 50 MG tablet Take 50 mg by mouth as needed.        . fish oil-omega-3 fatty acids 1000 MG capsule Take 1,200 mg by mouth daily.        Marland Kitchen FLUoxetine (PROZAC)  20 MG tablet Take 20 mg by mouth daily.        . Glucosamine 500 MG CAPS Take 2 capsules by mouth 2 (two) times daily.        . Lactobacillus (ACIDOPHILUS PO) Take by mouth as needed.        . Oxymetazoline HCl (NASAL SPRAY NA) Place into the nose. fluticazone       . triamcinolone cream (KENALOG) 0.1 % Apply topically as needed.            Review of Systems  Constitutional: Negative for fever and unexpected weight change.  HENT: Positive for congestion. Negative for ear pain, nosebleeds, sore throat, rhinorrhea, sneezing, trouble swallowing, dental problem, postnasal drip and sinus pressure.   Eyes: Negative for redness and itching.  Respiratory: Positive for shortness of breath. Negative for cough, chest tightness and wheezing.   Cardiovascular: Negative for palpitations and leg swelling.  Gastrointestinal: Negative for nausea and vomiting.  Genitourinary: Negative for dysuria.  Musculoskeletal: Negative for joint swelling.  Skin: Positive for rash.  Neurological: Negative for headaches.  Hematological: Does not bruise/bleed easily.  Psychiatric/Behavioral: Negative for dysphoric mood. The patient is not nervous/anxious.        Objective:   Physical Exam  Vitals reviewed. Constitutional: She is oriented to person, place, and time. She appears well-developed and well-nourished. No distress.  HENT:  Head: Normocephalic and atraumatic.  Right Ear: External ear normal.  Left Ear: External ear normal.  Mouth/Throat: Oropharynx is clear and moist. No oropharyngeal exudate.  Eyes: Conjunctivae and EOM are normal. Pupils are equal, round, and reactive to light. Right eye exhibits no discharge. Left eye exhibits no discharge. No scleral icterus.  Neck: Normal range of motion. Neck supple. No JVD present. No tracheal deviation present. No thyromegaly present.  Cardiovascular: Normal rate, regular rhythm, normal heart sounds and intact distal pulses.  Exam reveals no gallop and no friction  rub.   No murmur heard. Pulmonary/Chest: Effort normal and breath sounds normal. No respiratory distress. She has no wheezes. She has no rales. She exhibits no tenderness.  Abdominal: Soft. Bowel sounds are normal. She exhibits no distension and no mass. There is no tenderness. There is no rebound and no guarding.  Musculoskeletal: Normal range of motion. She exhibits no edema and no tenderness.  Lymphadenopathy:    She has no cervical adenopathy.  Neurological: She is alert and oriented  to person, place, and time. She has normal reflexes. No cranial nerve deficit. She exhibits normal muscle tone. Coordination normal.  Skin: Skin is warm and dry. No rash noted. She is not diaphoretic. No erythema. No pallor.  Psychiatric: She has a normal mood and affect. Her behavior is normal. Judgment and thought content normal.          Assessment & Plan:

## 2011-09-12 ENCOUNTER — Encounter: Payer: Self-pay | Admitting: Internal Medicine

## 2011-09-12 DIAGNOSIS — IMO0001 Reserved for inherently not codable concepts without codable children: Secondary | ICD-10-CM

## 2011-09-12 DIAGNOSIS — J449 Chronic obstructive pulmonary disease, unspecified: Secondary | ICD-10-CM | POA: Insufficient documentation

## 2011-09-12 DIAGNOSIS — F17200 Nicotine dependence, unspecified, uncomplicated: Secondary | ICD-10-CM

## 2011-09-12 HISTORY — DX: Nicotine dependence, unspecified, uncomplicated: F17.200

## 2011-09-12 HISTORY — DX: Reserved for inherently not codable concepts without codable children: IMO0001

## 2011-09-12 NOTE — Assessment & Plan Note (Signed)
She is unaware of lung nodule. I explained the risk of lung cancer. Will get CT chest and reassess

## 2011-09-12 NOTE — Assessment & Plan Note (Signed)
Clearly has smoking related emphysema on CT. Dyspnea is only class 1-2 but I would expect her lung function to be severe due to exertional desaturations. She has kepr herself fit so far and so able to adapt with less dyspnea perception. Will get PFTs and discuss Rx at followup

## 2011-09-12 NOTE — Assessment & Plan Note (Signed)
5 min counseling to quit. She says she loves her cigarettes and will not be able to quit though she knows she has to and understands the reason to quit

## 2011-09-13 ENCOUNTER — Ambulatory Visit (INDEPENDENT_AMBULATORY_CARE_PROVIDER_SITE_OTHER)
Admission: RE | Admit: 2011-09-13 | Discharge: 2011-09-13 | Disposition: A | Discharge status: Home / Self Care | Payer: Medicare Other | Source: Ambulatory Visit | Attending: Internal Medicine | Admitting: Internal Medicine

## 2011-09-13 ENCOUNTER — Telehealth: Payer: Self-pay | Admitting: *Deleted

## 2011-09-13 DIAGNOSIS — R911 Solitary pulmonary nodule: Secondary | ICD-10-CM

## 2011-09-13 MED ORDER — IOHEXOL 300 MG/ML  SOLN
80.0000 mL | Freq: Once | INTRAMUSCULAR | Status: AC | PRN
  Administered 2011-09-13: 80 mL via INTRAVENOUS

## 2011-09-13 NOTE — Telephone Encounter (Signed)
Addended by: [name] on: 09/13/2011 03:17 PM   Modules accepted: Orders

## 2011-09-13 NOTE — Telephone Encounter (Signed)
PFT and OV set for 09-21-11. [name], CMA

## 2011-09-13 NOTE — Telephone Encounter (Signed)
sppoke to patient. Gave Ct chest report showing enlarging mass and suspicion of lung cancer. Pleas schedule PET scan (ordered) but i need to see her sooner than 10/04/11 with full pft as well (not just spirometry). If needed, please do PFT at [facility] (PFT not ordered)

## 2011-09-13 NOTE — Telephone Encounter (Signed)
MR received call report for CT, same should be in your results box. Thanks!

## 2011-09-21 ENCOUNTER — Ambulatory Visit (INDEPENDENT_AMBULATORY_CARE_PROVIDER_SITE_OTHER): Payer: Medicare Other | Admitting: Internal Medicine

## 2011-09-21 ENCOUNTER — Encounter: Payer: Self-pay | Admitting: Internal Medicine

## 2011-09-21 ENCOUNTER — Telehealth: Payer: Self-pay | Admitting: Internal Medicine

## 2011-09-21 ENCOUNTER — Encounter (HOSPITAL_COMMUNITY)
Admission: RE | Admit: 2011-09-21 | Discharge: 2011-09-21 | Disposition: A | Discharge status: Home / Self Care | Payer: Medicare Other | Source: Ambulatory Visit | Attending: Internal Medicine | Admitting: Internal Medicine

## 2011-09-21 VITALS — BP 128/82 | HR 83 | Temp 98.0°F | Ht 62.0 in | Wt 131.0 lb

## 2011-09-21 DIAGNOSIS — F17200 Nicotine dependence, unspecified, uncomplicated: Secondary | ICD-10-CM

## 2011-09-21 DIAGNOSIS — R911 Solitary pulmonary nodule: Secondary | ICD-10-CM | POA: Insufficient documentation

## 2011-09-21 DIAGNOSIS — Q446 Cystic disease of liver: Secondary | ICD-10-CM | POA: Insufficient documentation

## 2011-09-21 DIAGNOSIS — K8020 Calculus of gallbladder without cholecystitis without obstruction: Secondary | ICD-10-CM | POA: Insufficient documentation

## 2011-09-21 DIAGNOSIS — J449 Chronic obstructive pulmonary disease, unspecified: Secondary | ICD-10-CM

## 2011-09-21 DIAGNOSIS — Z23 Encounter for immunization: Secondary | ICD-10-CM

## 2011-09-21 LAB — PULMONARY FUNCTION TEST

## 2011-09-21 LAB — GLUCOSE, CAPILLARY: Glucose-Capillary: 103 mg/dL — ABNORMAL HIGH (ref 70–99)

## 2011-09-21 MED ORDER — FLUDEOXYGLUCOSE F - 18 (FDG) INJECTION
17.3000 | Freq: Once | INTRAVENOUS | Status: AC | PRN
  Administered 2011-09-21: 17.3 via INTRAVENOUS

## 2011-09-21 NOTE — Telephone Encounter (Signed)
Per JC, okay to go ahead and send order to [facility], this should have been done today before the pt left.  Order was sent to [facility] and spoke with pt and notified that this was done. She verbalized understanding and states nothing further needed.

## 2011-09-21 NOTE — Progress Notes (Signed)
Subjective:    Patient ID: [name], female    DOB: August 31, 1940, 71 y.o.   MRN: [id number]  HPI  IOV 09/09/11  PMD is Dr Toula Moos. Known spring allergies, IBS, OA - knees and hip and does regular water exercises. Drinks 2 coors lite each night (CAGE -negative).  Reports that she has been smoking Cigarettes.  She has a 25 pack-year smoking history. She does not have any smokeless tobacco history on file. Says that between 1999 - 2001 and 2004-2009 traveled several countries with husband - [place], [place], [place], [place], [place].  But now back in [place] since 2009. At that time  prior PMD Dr Ursula Beath told her that she has COPD. Now PMD is Dr Ludwig Clarks since Jan 2012 and a month ago advised her to come to Pulmonary following an episode of bronchitis.   She herself feels well other than mild dyspnea with undue exertion that is relieved by rest. Walks god, yard work and cleaning home does not make her dyspneic. Walking uphill makes her dyspneic. Denies associated cough, weight loss, hemoptysis  Or CAD. However, in the office walking - she desaturated  Of note, there is SPN on imaging below but she denies knowledge of this   OV 09/21/11 Follwoup to discuss results  Of PFT and and CT chest. No new complaints  Nm Pet Image Initial (pi) Skull Base To Thigh  09/21/2011  *RADIOLOGY REPORT*  Clinical Data:  Initial treatment strategy for right lung nodule.  NUCLEAR MEDICINE PET CT INITIAL (PI) SKULL BASE TO THIGH  Technique:  17.3 mCi F-18 FDG was injected intravenously via the right antecubital fossa.  Full-ring PET imaging was performed from the skull base through the mid-thighs 60   minutes after injection.  CT data was obtained and used for attenuation correction and anatomic localization only.  (This was not acquired as a diagnostic CT examination.)  Fasting Blood Glucose:  103  Patient Weight:  130 pounds.  Comparison:  None.  Findings: No enlarged or hypermetabolic supraclavicular  lymph nodes.  There are no hypermetabolic axillary lymph nodes.  No enlarged mediastinal or hilar lymph nodes.  There are calcifications within the LAD, RCA, and left circumflex coronary arteries.  Lungs are emphysematous.  The pulmonary nodule within the right upper lobe measures 2 x 1.6 x 1.8 cm.  There is intense FDG uptake associated with this nodule with an SUV max equal to 12.6.  There are no additional hypermetabolic pulmonary parenchymal nodules or masses identified.  There are multiple stones within the lumen of the gallbladder.  No secondary signs of acute cholecystitis.  There is no biliary dilatation.  Changes of polycystic liver disease noted.  The pancreas appears within normal limits.  The spleen appears within normal limits.  No enlarged or hypermetabolic upper abdominal lymph nodes.  There is no pelvic or inguinal adenopathy.  IMPRESSION:  1.  Pulmonary nodule within the right upper lobe exhibits malignant range FDG uptake and is worrisome for primary lung neoplasm. 2.  There are no specific features to suggest metastatic disease. If this is biopsied and is a non small cell lung cancer then this would be considered a stagedT1aN0M0 3.  Gallstones.  Original Report Authenticated By: [name], M.D.   PFTs  fev1 1.1L/60% , DLCO 9.6/56%, TLC 5.6/128% (not on Rx),       Past Medical History  Diagnosis Date  . Arthritis   . COPD (chronic obstructive pulmonary disease)   . Depression   .  Anxiety   . Hypertension   . Gout   . Hepatic cyst   . Migraine   . OCD (obsessive compulsive disorder)   . Osteoporosis   . Tinea corporis   . Acute bronchitis   . Allergic rhinitis   . Chronic diarrhea of unknown origin   . Gout   . Macrocytosis   . Skin rash   . Flushing   . Urinary system symptoms, other   . Need for prophylactic vaccination and inoculation against influenza   . Anemia   . Benign paroxysmal positional vertigo   . Hyperlipidemia      Family History  Problem  Relation Age of Onset  . Alcohol abuse    . Depression    . Hearing loss    . Heart disease    . Hypertension       History   Social History  . Marital Status: Married    Spouse Name: N/A    Number of Children: N/A  . Years of Education: N/A   Occupational History  . retired    Social History Main Topics  . Smoking status: Current Everyday Smoker -- 0.5 packs/day for 50 years    Types: Cigarettes  . Smokeless tobacco: Not on file  . Alcohol Use: 1.8 oz/week    3 Cans of beer per week     social  . Drug Use: No  . Sexually Active: Not on file   Other Topics Concern  . Not on file   Social History Narrative  . No narrative on file     Allergies  Allergen Reactions  . Clindamycin/Lincomycin Nausea And Vomiting  . Penicillins   . Sulfa Antibiotics Rash     Outpatient Prescriptions Prior to Visit  Medication Sig Dispense Refill  . acetaminophen (TYLENOL) 650 MG CR tablet Take 1,300 mg by mouth 2 (two) times daily.        . Albuterol Sulfate (PROAIR HFA IN) Inhale into the lungs as needed.        Marland Kitchen amLODipine (NORVASC) 5 MG tablet Take 5 mg by mouth daily.        . Calcium Carbonate-Vitamin D (CALCIUM + D PO) Take by mouth daily. 500+400       . diphenhydrAMINE (BENADRYL) 50 MG tablet Take 50 mg by mouth as needed.        . fish oil-omega-3 fatty acids 1000 MG capsule Take 1,200 mg by mouth daily.        Marland Kitchen FLAXSEED, LINSEED, PO Take 1 tablet by mouth daily.      Marland Kitchen FLUoxetine (PROZAC) 20 MG tablet Take 20 mg by mouth daily.        Marland Kitchen FLUTICASONE PROPIONATE, NASAL, NA Place 2 sprays into the nose as needed.      . Glucosamine 500 MG CAPS Take 2 capsules by mouth 2 (two) times daily.        . vitamin C (ASCORBIC ACID) 500 MG tablet Take 500 mg by mouth daily.       Facility-Administered Medications Prior to Visit  Medication Dose Route Frequency Provider Last Rate Last Dose  . fludeoxyglucose F - 18 (FDG) injection 17.3 milli Curie  17.3 milli Curie Intravenous Once  PRN Medication Radiologist, MD   17.3 milli Curie at 09/21/11 0745      Review of Systems  Constitutional: Negative for fever and unexpected weight change.  HENT: Positive for congestion. Negative for ear pain, nosebleeds, sore throat, rhinorrhea, sneezing, trouble swallowing, dental  problem, postnasal drip and sinus pressure.   Eyes: Negative for redness and itching.  Respiratory: Positive for shortness of breath. Negative for cough, chest tightness and wheezing.   Cardiovascular: Negative for palpitations and leg swelling.  Gastrointestinal: Negative for nausea and vomiting.  Genitourinary: Negative for dysuria.  Musculoskeletal: Negative for joint swelling.  Skin: Positive for rash.  Neurological: Negative for headaches.  Hematological: Does not bruise/bleed easily.  Psychiatric/Behavioral: Negative for dysphoric mood. The patient is not nervous/anxious.        Objective:   Physical Exam  Vitals reviewed. Constitutional: She is oriented to person, place, and time. She appears well-developed and well-nourished. No distress.  HENT:  Head: Normocephalic and atraumatic.  Right Ear: External ear normal.  Left Ear: External ear normal.  Mouth/Throat: Oropharynx is clear and moist. No oropharyngeal exudate.  Eyes: Conjunctivae and EOM are normal. Pupils are equal, round, and reactive to light. Right eye exhibits no discharge. Left eye exhibits no discharge. No scleral icterus.  Neck: Normal range of motion. Neck supple. No JVD present. No tracheal deviation present. No thyromegaly present.  Cardiovascular: Normal rate, regular rhythm, normal heart sounds and intact distal pulses.  Exam reveals no gallop and no friction rub.   No murmur heard. Pulmonary/Chest: Effort normal and breath sounds normal. No respiratory distress. She has no wheezes. She has no rales. She exhibits no tenderness.  Abdominal: Soft. Bowel sounds are normal. She exhibits no distension and no mass. There is no  tenderness. There is no rebound and no guarding.  Musculoskeletal: Normal range of motion. She exhibits no edema and no tenderness.  Lymphadenopathy:    She has no cervical adenopathy.  Neurological: She is alert and oriented to person, place, and time. She has normal reflexes. No cranial nerve deficit. She exhibits normal muscle tone. Coordination normal.  Skin: Skin is warm and dry. No rash noted. She is not diaphoretic. No erythema. No pallor.  Psychiatric: She has a normal mood and affect. Her behavior is normal. Judgment and thought content normal.          Assessment & Plan:

## 2011-09-21 NOTE — Patient Instructions (Addendum)
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

## 2011-09-21 NOTE — Progress Notes (Signed)
PFT done today. 

## 2011-09-22 ENCOUNTER — Telehealth: Payer: Self-pay | Admitting: Internal Medicine

## 2011-09-22 ENCOUNTER — Encounter: Payer: Self-pay | Admitting: Internal Medicine

## 2011-09-22 NOTE — Assessment & Plan Note (Signed)
Advised to quit especially for surgical risk amelioration

## 2011-09-22 NOTE — Telephone Encounter (Signed)
forg ot to tell her but is important that she knows  Quitting smoking atleast through surgery will help risk for lung surgery. Please let her know

## 2011-09-22 NOTE — Assessment & Plan Note (Addendum)
Gold stage 2 copd  Plan Start spiriva and symbicort - will give sample and mdi technique taugh Pneumovax today Check alpha 1 next visit  Note: we discussed rationale for triple therapy. She is not particularly interested in spiriva due to lack of subjective improvement in past but have advised her to take both mdi's with hope to help improve lung function and asses as possible lobectomy candidate

## 2011-09-22 NOTE — Assessment & Plan Note (Addendum)
High clinical pretest for Stage 1A T0NOMO 2cm RUL lesion - PET HOT grown over 2 years. Dobut alternative etiology. She is borderline surgical candidate. Predicted postoperative Fev1 and dlco both work out to 0.88L/48% and 44.2% respectively with a multople ofr 2121. This should make her an accetopable lobectomy candidate v borderline. She is functional but desaturated with exertion on office. Have urged her to take mdi's to see if lung function can be optimized. Will refer to CVTS for lobectomy assessment. Can consider CPST if they feel so. IF not a surgical candidate, then needs bx to refer to radiation Rx  > 50% of this > 25 min visit spent in face to face counseling (15 min visit was extended)

## 2011-09-23 ENCOUNTER — Telehealth: Payer: Self-pay | Admitting: Internal Medicine

## 2011-09-23 ENCOUNTER — Encounter: Payer: Self-pay | Admitting: *Deleted

## 2011-09-23 ENCOUNTER — Telehealth: Payer: Self-pay | Admitting: *Deleted

## 2011-09-23 MED ORDER — BUDESONIDE-FORMOTEROL FUMARATE 160-4.5 MCG/ACT IN AERO: 2.0000 | INHALATION_SPRAY | Freq: Two times a day (BID) | RESPIRATORY_TRACT | Status: DC

## 2011-09-23 MED ORDER — TIOTROPIUM BROMIDE MONOHYDRATE 18 MCG IN CAPS: 18.0000 ug | ORAL_CAPSULE | Freq: Every day | RESPIRATORY_TRACT | Status: DC

## 2011-09-23 NOTE — Progress Notes (Signed)
Pt called.  Spoke to him regarding appt next Thurday 09/30/11 at 4:00.  Questions and concerns answered

## 2011-09-23 NOTE — Telephone Encounter (Signed)
I spoke with pt and advised her rx has been sent to [facility] per pt request. She voiced her understanding and needed nothing further

## 2011-09-23 NOTE — Telephone Encounter (Signed)
I informed pt of MR's findings and recommendations. Pt verbalized understanding. And pt stated "done".

## 2011-09-23 NOTE — Telephone Encounter (Signed)
Left message for pt. 09/30/11 at 4:00 MTOC

## 2011-09-28 ENCOUNTER — Encounter: Payer: Medicare Other | Admitting: Thoracic Surgery (Cardiothoracic Vascular Surgery)

## 2011-09-30 ENCOUNTER — Institutional Professional Consult (permissible substitution) (INDEPENDENT_AMBULATORY_CARE_PROVIDER_SITE_OTHER): Payer: Medicare Other | Admitting: Cardiothoracic Surgery

## 2011-09-30 ENCOUNTER — Ambulatory Visit: Payer: Medicare Other | Admitting: Radiation Oncology

## 2011-09-30 VITALS — BP 130/79 | HR 60 | Resp 20 | Ht 62.0 in | Wt 130.0 lb

## 2011-09-30 DIAGNOSIS — D7589 Other specified diseases of blood and blood-forming organs: Secondary | ICD-10-CM

## 2011-09-30 DIAGNOSIS — R911 Solitary pulmonary nodule: Secondary | ICD-10-CM

## 2011-10-01 ENCOUNTER — Other Ambulatory Visit: Payer: Self-pay | Admitting: Cardiothoracic Surgery

## 2011-10-01 DIAGNOSIS — D381 Neoplasm of uncertain behavior of trachea, bronchus and lung: Secondary | ICD-10-CM

## 2011-10-01 DIAGNOSIS — C7931 Secondary malignant neoplasm of brain: Secondary | ICD-10-CM

## 2011-10-04 ENCOUNTER — Ambulatory Visit: Payer: Medicare Other | Admitting: Internal Medicine

## 2011-10-05 ENCOUNTER — Encounter: Payer: Self-pay | Admitting: Internal Medicine

## 2011-10-05 NOTE — Patient Instructions (Signed)
Smoking Cessation This document explains the best ways for you to quit smoking and new treatments to help. It lists new medicines that can double or triple your chances of quitting and quitting for good. It also considers ways to avoid relapses and concerns you may have about quitting, including weight gain. NICOTINE: A POWERFUL ADDICTION If you have tried to quit smoking, you know how hard it can be. It is hard because nicotine is a very addictive drug. For some people, it can be as addictive as heroin or cocaine. Usually, people make 2 or 3 tries, or more, before finally being able to quit. Each time you try to quit, you can learn about what helps and what hurts. Quitting takes hard work and a lot of effort, but you can quit smoking. QUITTING SMOKING IS ONE OF THE MOST IMPORTANT THINGS YOU WILL EVER DO.  You will live longer, feel better, and live better.   The impact on your body of quitting smoking is felt almost immediately:   Within 20 minutes, blood pressure decreases. Pulse returns to its normal level.   After 8 hours, carbon monoxide levels in the blood return to normal. Oxygen level increases.   After 24 hours, chance of heart attack starts to decrease. Breath, hair, and body stop smelling like smoke.   After 48 hours, damaged nerve endings begin to recover. Sense of taste and smell improve.   After 72 hours, the body is virtually free of nicotine. Bronchial tubes relax and breathing becomes easier.   After 2 to 12 weeks, lungs can hold more air. Exercise becomes easier and circulation improves.   Quitting will reduce your risk of having a heart attack, stroke, cancer, or lung disease:   After 1 year, the risk of coronary heart disease is cut in half.   After 5 years, the risk of stroke falls to the same as a nonsmoker.   After 10 years, the risk of lung cancer is cut in half and the risk of other cancers decreases significantly.   After 15 years, the risk of coronary heart  disease drops, usually to the level of a nonsmoker.   If you are pregnant, quitting smoking will improve your chances of having a healthy baby.   The people you live with, especially your children, will be healthier.   You will have extra money to spend on things other than cigarettes.  FIVE KEYS TO QUITTING Studies have shown that these 5 steps will help you quit smoking and quit for good. You have the best chances of quitting if you use them together: 1. Get ready.  2. Get support and encouragement.  3. Learn new skills and behaviors.  4. Get medicine to reduce your nicotine addiction and use it correctly.  5. Be prepared for relapse or difficult situations. Be determined to continue trying to quit, even if you do not succeed at first.  1. GET READY  Set a quit date.   Change your environment.   Get rid of ALL cigarettes, ashtrays, matches, and lighters in your home, car, and place of work.   Do not let people smoke in your home.   Review your past attempts to quit. Think about what worked and what did not.   Once you quit, do not smoke. NOT EVEN A PUFF!  2. GET SUPPORT AND ENCOURAGEMENT Studies have shown that you have a better chance of being successful if you have help. You can get support in many ways.  Tell   your family, friends, and coworkers that you are going to quit and need their support. Ask them not to smoke around you.   Talk to your caregivers (doctor, dentist, nurse, pharmacist, psychologist, and/or smoking counselor).   Get individual, group, or telephone counseling and support. The more counseling you have, the better your chances are of quitting. Programs are available at local hospitals and health centers. Call your local health department for information about programs in your area.   Spiritual beliefs and practices may help some smokers quit.   Quit meters are small computer programs online or downloadable that keep track of quit statistics, such as amount  of "quit-time," cigarettes not smoked, and money saved.   Many smokers find one or more of the many self-help books available useful in helping them quit and stay off tobacco.  3. LEARN NEW SKILLS AND BEHAVIORS  Try to distract yourself from urges to smoke. Talk to someone, go for a walk, or occupy your time with a task.   When you first try to quit, change your routine. Take a different route to work. Drink tea instead of coffee. Eat breakfast in a different place.   Do something to reduce your stress. Take a hot bath, exercise, or read a book.   Plan something enjoyable to do every day. Reward yourself for not smoking.   Explore interactive web-based programs that specialize in helping you quit.  4. GET MEDICINE AND USE IT CORRECTLY Medicines can help you stop smoking and decrease the urge to smoke. Combining medicine with the above behavioral methods and support can quadruple your chances of successfully quitting smoking. The [place] Food and Drug Administration (FDA) has approved 7 medicines to help you quit smoking. These medicines fall into 3 categories.  Nicotine replacement therapy (delivers nicotine to your body without the negative effects and risks of smoking):   Nicotine gum: Available over-the-counter.   Nicotine lozenges: Available over-the-counter.   Nicotine inhaler: Available by prescription.   Nicotine nasal spray: Available by prescription.   Nicotine skin patches (transdermal): Available by prescription and over-the-counter.   Antidepressant medicine (helps people abstain from smoking, but how this works is unknown):   Bupropion sustained-release (SR) tablets: Available by prescription.   Nicotinic receptor partial agonist (simulates the effect of nicotine in your brain):   Varenicline tartrate tablets: Available by prescription.   Ask your caregiver for advice about which medicines to use and how to use them. Carefully read the information on the package.    Everyone who is trying to quit may benefit from using a medicine. If you are pregnant or trying to become pregnant, nursing an infant, you are under age 18, or you smoke fewer than 10 cigarettes per day, talk to your caregiver before taking any nicotine replacement medicines.   You should stop using a nicotine replacement product and call your caregiver if you experience nausea, dizziness, weakness, vomiting, fast or irregular heartbeat, mouth problems with the lozenge or gum, or redness or swelling of the skin around the patch that does not go away.   Do not use any other product containing nicotine while using a nicotine replacement product.   Talk to your caregiver before using these products if you have diabetes, heart disease, asthma, stomach ulcers, you had a recent heart attack, you have high blood pressure that is not controlled with medicine, a history of irregular heartbeat, or you have been prescribed medicine to help you quit smoking.  5. BE PREPARED FOR RELAPSE OR   DIFFICULT SITUATIONS  Most relapses occur within the first 3 months after quitting. Do not be discouraged if you start smoking again. Remember, most people try several times before they finally quit.   You may have symptoms of withdrawal because your body is used to nicotine. You may crave cigarettes, be irritable, feel very hungry, cough often, get headaches, or have difficulty concentrating.   The withdrawal symptoms are only temporary. They are strongest when you first quit, but they will go away within 10 to 14 days.  Here are some difficult situations to watch for:  Alcohol. Avoid drinking alcohol. Drinking lowers your chances of successfully quitting.   Caffeine. Try to reduce the amount of caffeine you consume. It also lowers your chances of successfully quitting.   Other smokers. Being around smoking can make you want to smoke. Avoid smokers.   Weight gain. Many smokers will gain weight when they quit, usually  less than 10 pounds. Eat a healthy diet and stay active. Do not let weight gain distract you from your main goal, quitting smoking. Some medicines that help you quit smoking may also help delay weight gain. You can always lose the weight gained after you quit.   Bad mood or depression. There are a lot of ways to improve your mood other than smoking.  If you are having problems with any of these situations, talk to your caregiver. SPECIAL SITUATIONS AND CONDITIONS Studies suggest that everyone can quit smoking. Your situation or condition can give you a special reason to quit.  Pregnant women/new mothers: By quitting, you protect your baby's health and your own.   Hospitalized patients: By quitting, you reduce health problems and help healing.   Heart attack patients: By quitting, you reduce your risk of a second heart attack.   Lung, head, and neck cancer patients: By quitting, you reduce your chance of a second cancer.   Parents of children and adolescents: By quitting, you protect your children from illnesses caused by secondhand smoke.  QUESTIONS TO THINK ABOUT Think about the following questions before you try to stop smoking. You may want to talk about your answers with your caregiver.  Why do you want to quit?   If you tried to quit in the past, what helped and what did not?   What will be the most difficult situations for you after you quit? How will you plan to handle them?   Who can help you through the tough times? Your family? Friends? Caregiver?   What pleasures do you get from smoking? What ways can you still get pleasure if you quit?  Here are some questions to ask your caregiver:  How can you help me to be successful at quitting?   What medicine do you think would be best for me and how should I take it?   What should I do if I need more help?   What is smoking withdrawal like? How can I get information on withdrawal?  Quitting takes hard work and a lot of effort,  but you can quit smoking. FOR MORE INFORMATION  Smokefree.gov (http://www.davis-sullivan.com/) provides free, accurate, evidence-based information and professional assistance to help support the immediate and long-term needs of people trying to quit smoking. Document Released: 07/20/2001 Document Revised: 04/07/2011 Document Reviewed: 05/12/2009 [facility] Patient Information 2012 [place], [place].Pulmonary Nodule A pulmonary (lung) nodule is small, round growth in the lung. The size of a pulmonary nodule can be as small as a pencil eraser (1/5 inch or 4 millmeters)  to a little bigger than your biggest toenail (1 inch or 25 millimeters). A pulmonary nodule is usually an unplanned finding. It may be found on a chest X-ray or a computed tomography (CT) scan when you have imaging tests of your lungs done. When a pulmonary nodule is found, tests will be done to determine if the nodule is benign (not cancerous) or malignant (cancerous). Follow-up treatment or testing is based on the size of the pulmonary nodule and your risk of getting lung cancer.  CAUSES Causes of pulmonary nodules can vary.  Benign pulmonary nodules  can be caused from different things. Some of these things include:  Infection. This can be a common cause of a benign pulmonary nodule. The infection may be active (a current infection) or an old infection that is no longer active. Three types of infections can cause a pulmonary nodule. These are:   Bacterial Infection.   Fungal infection.   Viral Infections.   Hematoma. This is a bruise in the lung. A hematoma can happen from an injury to your chest.   Some common diseases can lead to benign pulmonary nodules. For example, rheumatoid arthritis can be a cause of a pulmonary nodule.   Other unusual things can cause a benign pulmonary nodule. These can include:   Having had tuberculosis.   Rare diseases, such as a lung cyst.  Malignant pulmonary nodules.  These are cancerous growths. The  cancer may have:  Started in the lung. Some lung cancers first detected as a pulmonary nodule.   Spread to the lung from cancer somewhere else in the body. This is called metastatic cancer.   Certain risk factors make a cancerous pulmonary nodule more likely. They include:   Age. As people get older, a pulmonary nodule is more likely to be cancerous.   Cancer history. If one of your immediate family members has had cancer, you have a higher risk of developing cancer.   Smoking. This includes people who currently smoke and those who have quit.  DIAGNOSIS To diagnose whether a pulmonary nodule is benign or malignant, a variety of tests will be done. This includes things such as:  Health history. Questions regarding your current health, past health, and family health will be asked.   Blood tests. Results of blood work can show:   Tumor markers for cancer.   Any type of infection.   A skin test called a tuberculin (TB) test may be done. This test can tell if you have been exposed to the germ that causes tuberculosis.   Imaging tests. These take pictures of your lungs. Types of imaging tests include:   Chest X-ray. This can help in several ways. An X-ray gives a close-up look at the pulmonary nodule. A new X-ray can be compared with any X-rays you have had in the past.    Computed tomography  (CT) scan. This test shows smaller pulmonary nodules more clearly than an X-ray.   Positron emission tomography  (PET) scan. This is a test that uses a radioactive substance to identify a pulmonary nodule. A safe amount of radioactive substance is injected into the blood stream. Then, the scan takes a picture of the pulmonary nodule. A malignant pulmonary nodule will absorb the substance faster than a benign pulmonary nodule. The radioactive substance is eliminated from your body in your urine.   Biopsy.  This removes a tiny piece of the pulmonary nodule so it can be checked under a microscope.  Medicine will be given  to help keep you relaxed and pain free when a biopsy is done. Types of biopsies include:   Bronchoscopy . This is a surgical procedure. It can be used for pulmonary nodules that are close to the airways in the lung. It uses a scope (a thin tube) with a tiny camera and light on the end. The scope is put in the windpipe. Your caregiver can then see inside the lung. A tiny tool put through the scope is used to take a small sample of the pulmonary nodule tissue.   Transthoracic needle aspiration . This method is used if the pulmonary nodule is far away from the air passages in the lung. A long, thin needle is put through the chest into the lung nodule. A CT scan is done at the same time which can make it easier to locate the pulmonary nodule.   Surgical lung biopsy . This is a surgical procedure in which the pulmonary nodule is removed. This is usually recommended when the pulmonary nodule is most likely malignant or a biopsy cannot be obtained by either bronchoscopy or transthoracic needle aspiration.  PULMONARY NODULE FOLLOW-UP RECOMMENDATIONS The frequency of pulmonary nodule follow-up is based on your risk factors and size of the pulmonary nodule. If your caregiver suspects the pulmonary nodule is cancerous or the pulmonary nodule changes during any of the follow-up CT scans, additional testing or biopsies will be done.   If you have no or low risk of getting lung cancer (non-smoker, no personal cancer history), recommended follow-up is based on the following pulmonary nodule size:   A pulmonary nodule that is < 4 mm does not require any follow-up.   A pulmonary nodule that is 4 to 6 mm should be re-imaged by CT scan in 12 months.   A pulmonary nodule that is 6 to 8 mm should be re-imaged by CT scan at 6 to 12 months and then again at 18 to 24 months if no change in size.   A pulmonary nodule > 8 mm in size should be followed closely and re-imaged by CT scan at 3, 9, and 24  months.    If you are at risk of getting lung cancer (current or former smoker, family history of cancer), recommended follow-up is based on the following pulmonary nodule size:   A pulmonary nodule that is < 4 mm in size should be re-imaged by CT scan in 12 months.   A pulmonary nodule that is 4 to 6 mm in size should be re-imaged by CT scan at 6 to 12 months and again at 18 to 24 months.   A pulmonary nodule that is 6 to 8 mm in size should be re-imaged by CT scan at 3, 9, and 24 months.   A pulmonary nodule > 8 mm in size should be followed closely and re-imaged by CT scan at 3, 9, and 24 months.  SEEK MEDICAL CARE IF: While waiting for test results to determine what type of pulmonary nodule you have, be sure to contact your caregiver if you:  Have trouble breathing when you are active.   Feel sick or unusually tired.   Do not feel like eating.   Lose weight without trying to.   Develop chills or night sweats.   Mild or moderate fevers generally have no long-term effects and often do not require treatment. There are a few exceptions (see below).  SEEK IMMEDIATE MEDICAL CARE IF:  You cannot catch your breath or you begin  wheezing.   You cannot stop coughing.   You cough up blood.   You feel like you are going to pass out or become dizzy.   You have sudden chest pain.   You have a fever or persistent symptoms for more than 72 hours.   You have a fever and your symptoms suddenly get worse.  MAKE SURE YOU   Understand these instructions.   Will watch your condition.   Will get help right away if you are not doing well or get worse.  Document Released: 05/23/2009 Document Revised: 04/07/2011 Document Reviewed: 05/23/2009 [facility] Patient Information 2012 [place], [place].

## 2011-10-05 NOTE — Progress Notes (Signed)
[address]            [place] [address]          [phone]      [name] [facility] Medical Record #[id number] Date of Birth: February 27, 1941 Seen: 09-30-2011 Referring: [name], MD Primary Care: Henri Medal, MD, MD  Chief Complaint:    Chief Complaint  Patient presents with  . Lung Lesion    MTOC/ Referral from Dr Marchelle Gearing for eval on lung nodule seen on Chest CT 09/09/11, PET SCAN 09/13/11    History of Present Illness:    Patient with history of COPD referred to pulmonary consultation for COPD. She denies hemoptysis2, does have SOB with exertion but is functional and able to do yard work and climb stairs. On review of previous CT in 2011 after hip replacement  by  Dr Marchelle Gearing on visit 09/09/2011 demonstrated 7x 10 mm rt upper lung nodule. Patient ws unaware of this. Repeat CT scan has been done, and patient referred to Thoracic surgery    Current Activity/ Functional Status: Patient is independent with mobility/ambulation, transfers, ADL's, IADL's. independent with mobility/ambulation, transfers, ADL's, IADL's.   Past Medical History  Diagnosis Date  . Arthritis   . COPD (chronic obstructive pulmonary disease)   . Depression   . Anxiety   . Hypertension   . Gout   . Hepatic cyst   . Migraine   . OCD (obsessive compulsive disorder)   . Osteoporosis   . Tinea corporis   . Acute bronchitis   . Allergic rhinitis   . Chronic diarrhea of unknown origin   . Gout   . Macrocytosis   . Skin rash   . Flushing   . Urinary system symptoms, other   . Need for prophylactic vaccination and inoculation against influenza   . Anemia   . Benign paroxysmal positional vertigo   . Hyperlipidemia     Past Surgical History  Procedure Date  . Total abdominal hysterectomy   . Inguinal hernia repair   . Neuroplasty decompression median nerve at carpal tunnel   . Rotator cuff repair   . Tonsillectomy     Family History    Problem Relation Age of Onset  . Alcohol abuse    . Depression    . Hearing loss    . Heart disease    . Hypertension      History   Social History  . Marital Status: Married    Spouse Name: N/A    Number of Children: N/A  . Years of Education: N/A   Occupational History  . retired    Social History Main Topics  . Smoking status: Former Smoker -- 0.5 packs/day for 50 years    Types: Cigarettes    Quit date: 09/23/2011  . Smokeless tobacco: Never Used  . Alcohol Use: 1.8 oz/week    3 Cans of beer per week     social  . Drug Use: No  . Sexually Active: Not on file      History  Smoking status  . Former Smoker -- 0.5 packs/day for 50 years  . Types: Cigarettes  . Quit date: 09/23/2011  Smokeless tobacco  . Never Used    History  Alcohol Use  . 1.8 oz/week  . 3 Cans of beer per week    social     Allergies  Allergen Reactions  .  Clindamycin/Lincomycin Nausea And Vomiting  . Penicillins   . Sulfa Antibiotics Rash    Current Outpatient Prescriptions  Medication Sig Dispense Refill  . acetaminophen (TYLENOL) 650 MG CR tablet Take 1,300 mg by mouth 2 (two) times daily.        . Albuterol Sulfate (PROAIR HFA IN) Inhale into the lungs as needed.        Marland Kitchen amLODipine (NORVASC) 5 MG tablet Take 5 mg by mouth daily.        . budesonide-formoterol (SYMBICORT) 160-4.5 MCG/ACT inhaler Inhale 2 puffs into the lungs 2 (two) times daily.  1 Inhaler  12  . Calcium Carbonate-Vitamin D (CALCIUM + D PO) Take by mouth daily. 500+400       . diphenhydrAMINE (BENADRYL) 50 MG tablet Take 50 mg by mouth as needed.        . fish oil-omega-3 fatty acids 1000 MG capsule Take 1,200 mg by mouth daily.        Marland Kitchen FLAXSEED, LINSEED, PO Take 1 tablet by mouth daily.      Marland Kitchen FLUoxetine (PROZAC) 20 MG tablet Take 20 mg by mouth 2 (two) times daily.       Marland Kitchen FLUTICASONE PROPIONATE, NASAL, NA Place 2 sprays into the nose as needed.      . Glucosamine 500 MG CAPS Take 2 capsules by mouth 2  (two) times daily.        Marland Kitchen tiotropium (SPIRIVA HANDIHALER) 18 MCG inhalation capsule Place 1 capsule (18 mcg total) into inhaler and inhale daily.  30 capsule  12       Review of Systems:     Cardiac Review of Systems: Y or N  Chest Pain [  n  ]  Resting SOB [n   ] Exertional SOB  Cove.Etienne  ]  Orthopnea Milo.Brash  ]   Pedal Edema Milo.Brash   ]    Palpitations Milo.Brash  ] Syncope  Milo.Brash  ]   Presyncope Milo.Brash   ]  General Review of Systems: [Y] = yes [  ]=no Constitional: recent weight change [  ]; anorexia [  ]; fatigue [  ]; nausea [  ]; night sweats [ n ]; fever [n  ]; or chills [ n ];                                                                                                                                          Dental: poor dentition[n  ];   Eye : blurred vision [ y ]; diplopia Milo.Brash   ]; vision changes [ y ];  Amaurosis fugax[n  ]; Resp: cough [  ];  wheezing[  ];  hemoptysis[  ]; shortness of breath[  ]; paroxysmal nocturnal dyspnea[  ]; dyspnea on exertion[  ]; or orthopnea[  ];  GI:  gallstones[  ], vomiting[  ];  dysphagia[  ]; melena[  ];  hematochezia [  ]; heartburn[  ];   Hx of  Colonoscopy[  ]; GU: kidney stones [  ]; hematuria[n  ];   dysuria [  ];  nocturia[  ];  history of     obstruction [  ];             Skin: rash, swelling[  ];, hair loss[  ];  peripheral edema[n  ];  or itching[  ]; Musculosketetal: myalgias[n  ];  joint swelling[  ];  joint erythema[  ];  joint pain[  ];  back pain[  ];  Heme/Lymph: bruising[  ];  bleeding[  ];  anemia[  ];  Neuro: TIA[  ];  headaches[  ];  stroke[  ];  vertigo[n  ];  seizures[ n ];   paresthesias[  ];  difficulty walking[  ];  Psych:depression[  ]; anxiety[  ];  Endocrine: diabetes[  ];  thyroid dysfunction[n  ];  Immunizations: Flu Cove.Etienne  ]; Pneumococcal[ y ];  Other:  Physical Exam: BP 130/79  Pulse 60  Resp 20  Ht 5\' 2"  (1.575 m)  Wt 130 lb (58.968 kg)  BMI 23.78 kg/m2  SpO2 93% at rest on room air  General appearance: alert, cooperative, appears  older than stated age and no distress Neurologic: intact Heart: regular rate and rhythm, S1, S2 normal, no murmur, click, rub or gallop Lungs: clear to auscultation bilaterally Abdomen: soft, non-tender; bowel sounds normal; no masses,  no organomegaly Extremities: extremities normal, atraumatic, no cyanosis or edema No cervical or axillary lymphonapathy  Diagnostic Studies & Laboratory data:     Recent Radiology Findings:  Ct Chest W Contrast  09/13/2011  *RADIOLOGY REPORT*  Clinical Data: Follow-up lung nodule from February 2011.  No current chest complaints.  History of COPD.  CT CHEST WITH CONTRAST  Technique:  Multidetector CT imaging of the chest was performed following the standard protocol during bolus administration of intravenous contrast.  Contrast: 80mL OMNIPAQUE IOHEXOL 300 MG/ML IV SOLN  Comparison: Chest CT 09/11/2009.  Findings:  Mediastinum: Heart size is normal. There is atherosclerosis of the thoracic aorta, the great vessels of the mediastinum and the coronary arteries, including calcified atherosclerotic plaque in the left main, left anterior descending, left circumflex and right coronary arteries. There is no significant pericardial fluid, thickening or pericardial calcification. No pathologically enlarged mediastinal or hilar lymph nodes. Esophagus is unremarkable in appearance.  Lungs/Pleura: When compared to prior examination from 09/11/2009, the previously noted right upper lobe nodule has significantly increased in size, currently measuring 2.3 x 1.8 cm (image 23 of series 3), now demonstrating macrolobulated margins.  A background of mild diffuse bronchial wall thickening and moderate-severe centrilobular emphysema (most pronounced at the lung apices) is again noted.  Scarring in the inferior segment lingula and medial aspects of the lung bases bilaterally.  No consolidative airspace disease.  No pleural effusions.  Upper Abdomen: Again noted are numerous low attenuation  lesions scattered throughout the hepatic parenchyma which appears similar in size, number and distribution.  The largest of these is incompletely visualized within segment four and a (measuring at least 3.8 x 3.2 cm).  More cephalad within segment four there is a rim calcified lesion that also appears unchanged.  Remaining visualized portions of the upper abdomen are otherwise unremarkable.  Musculoskeletal: There are no aggressive appearing lytic or blastic lesions noted in the visualized portions of the skeleton.  IMPRESSION:  1.  Significant interval enlargement of a macrolobulated nodule in the right upper  lobe when compared to remote prior study.  This lesion currently measures 2.3 x 1.8 cm.  At this time, no definite mediastinal or hilar adenopathy is appreciated, and there are no other definite nodules identified.  This is suspicious for a primary bronchogenic neoplasm, and at this point appears to represent T1b, N0, Mx disease. Further evaluation with PET CT or CT- guided percutaneous needle biopsy is recommended. 2. Atherosclerosis, including left main and three-vessel coronary artery disease. Please note that although the presence of coronary artery calcium documents the presence of coronary artery disease, the severity of this disease and any potential stenosis cannot be assessed on this non-gated CT examination.  Assessment for potential risk factor modification, dietary therapy or pharmacologic therapy may be warranted, if clinically indicated. 3.  Moderate-severe central lobular emphysema redemonstrated. 4.  Multiple low-attenuation hepatic lesions appears similar to the prior examination.  The larger of these lesions appear consistent with cysts, while the smaller lesions are too small to definitively characterize (however, their stability over time would suggest benign lesions such as cysts or small biliary hamartomas).  These results will be called to the ordering clinician or representative by the  Radiologist Assistant, and communication documented in the PACS Dashboard.  Original Report Authenticated By: Florencia Reasons, M.D.   Nm Pet Image Initial (pi) Skull Base To Thigh  09/21/2011  *RADIOLOGY REPORT*  Clinical Data:  Initial treatment strategy for right lung nodule.  NUCLEAR MEDICINE PET CT INITIAL (PI) SKULL BASE TO THIGH  Technique:  17.3 mCi F-18 FDG was injected intravenously via the right antecubital fossa.  Full-ring PET imaging was performed from the skull base through the mid-thighs 60   minutes after injection.  CT data was obtained and used for attenuation correction and anatomic localization only.  (This was not acquired as a diagnostic CT examination.)  Fasting Blood Glucose:  103  Patient Weight:  130 pounds.  Comparison:  None.  Findings: No enlarged or hypermetabolic supraclavicular lymph nodes.  There are no hypermetabolic axillary lymph nodes.  No enlarged mediastinal or hilar lymph nodes.  There are calcifications within the LAD, RCA, and left circumflex coronary arteries.  Lungs are emphysematous.  The pulmonary nodule within the right upper lobe measures 2 x 1.6 x 1.8 cm.  There is intense FDG uptake associated with this nodule with an SUV max equal to 12.6.  There are no additional hypermetabolic pulmonary parenchymal nodules or masses identified.  There are multiple stones within the lumen of the gallbladder.  No secondary signs of acute cholecystitis.  There is no biliary dilatation.  Changes of polycystic liver disease noted.  The pancreas appears within normal limits.  The spleen appears within normal limits.  No enlarged or hypermetabolic upper abdominal lymph nodes.  There is no pelvic or inguinal adenopathy.  IMPRESSION:  1.  Pulmonary nodule within the right upper lobe exhibits malignant range FDG uptake and is worrisome for primary lung neoplasm. 2.  There are no specific features to suggest metastatic disease. If this is biopsied and is a non small cell lung cancer  then this would be considered a stagedT1aN0M0 3.  Gallstones.  Original Report Authenticated By: [name], M.D.    Recent Lab Findings: Lab Results  Component Value Date   WBC 5.8 08/11/2011   HGB 14.8 08/11/2011   HCT 42.6 08/11/2011   PLT 159 08/11/2011   GLUCOSE 93 09/09/2011   ALT 13 08/11/2011   AST 18 08/11/2011   NA 139 09/09/2011  K 4.2 09/09/2011   CL 101 09/09/2011   CREATININE 0.7 09/09/2011   BUN 8 09/09/2011   CO2 31 09/09/2011   INR 1.75* 09/13/2009   PFTS Done in [facility] [facility]: FVC 2.58 98% FEV1 1.13 60% DLCO  56%    Assessment / Plan:   1. Enlarging rt Upper lobe lung Nodule, elevated SUV on PET clinical T1N0  Lung cancer 2. Severe Obstructive Lung Disease with decrease diffusing capacity 3. Without symptoms or history CAD, but significant calcification of left main and Rt coronary artery   I have discussed likely diagnosis of carcinoma of lung with patient and husband, will further stage with brain MRI and have patient seen by cardiology in anticipation of surgical resection. Patient does have limited pulmonary reserve but remains functional. She has stopped smoking. After the above test are completed I will further discuss surgical resection.     [name] MD  Beeper [phone] Office [phone]

## 2011-10-06 ENCOUNTER — Ambulatory Visit
Admission: RE | Admit: 2011-10-06 | Discharge: 2011-10-06 | Disposition: A | Discharge status: Home / Self Care | Payer: Medicare Other | Source: Ambulatory Visit | Attending: Cardiothoracic Surgery | Admitting: Cardiothoracic Surgery

## 2011-10-06 DIAGNOSIS — C7931 Secondary malignant neoplasm of brain: Secondary | ICD-10-CM

## 2011-10-06 DIAGNOSIS — D381 Neoplasm of uncertain behavior of trachea, bronchus and lung: Secondary | ICD-10-CM

## 2011-10-06 MED ORDER — GADOBENATE DIMEGLUMINE 529 MG/ML IV SOLN
12.0000 mL | Freq: Once | INTRAVENOUS | Status: AC | PRN
  Administered 2011-10-06: 12 mL via INTRAVENOUS

## 2011-10-15 ENCOUNTER — Other Ambulatory Visit: Payer: Self-pay

## 2011-10-15 ENCOUNTER — Encounter: Payer: Self-pay | Admitting: Cardiothoracic Surgery

## 2011-10-15 ENCOUNTER — Ambulatory Visit (INDEPENDENT_AMBULATORY_CARE_PROVIDER_SITE_OTHER): Payer: Medicare Other | Admitting: Cardiothoracic Surgery

## 2011-10-15 ENCOUNTER — Encounter (HOSPITAL_COMMUNITY): Payer: Self-pay | Admitting: Pharmacy Technician

## 2011-10-15 VITALS — BP 110/76 | HR 77 | Resp 16 | Ht 64.0 in | Wt 130.0 lb

## 2011-10-15 DIAGNOSIS — R911 Solitary pulmonary nodule: Secondary | ICD-10-CM

## 2011-10-15 DIAGNOSIS — D7589 Other specified diseases of blood and blood-forming organs: Secondary | ICD-10-CM

## 2011-10-15 DIAGNOSIS — D381 Neoplasm of uncertain behavior of trachea, bronchus and lung: Secondary | ICD-10-CM

## 2011-10-18 ENCOUNTER — Encounter (HOSPITAL_COMMUNITY)
Admission: RE | Admit: 2011-10-18 | Discharge: 2011-10-18 | Disposition: A | Discharge status: Home / Self Care | Payer: Medicare Other | Source: Ambulatory Visit | Attending: Cardiothoracic Surgery | Admitting: Cardiothoracic Surgery

## 2011-10-18 ENCOUNTER — Encounter: Payer: Self-pay | Admitting: Cardiothoracic Surgery

## 2011-10-18 ENCOUNTER — Encounter (HOSPITAL_COMMUNITY): Payer: Self-pay

## 2011-10-18 ENCOUNTER — Encounter (HOSPITAL_COMMUNITY): Payer: Self-pay | Admitting: Pharmacy Technician

## 2011-10-18 ENCOUNTER — Ambulatory Visit (HOSPITAL_COMMUNITY)
Admission: RE | Admit: 2011-10-18 | Discharge: 2011-10-18 | Disposition: A | Discharge status: Home / Self Care | Payer: Medicare Other | Source: Ambulatory Visit | Attending: Cardiothoracic Surgery | Admitting: Cardiothoracic Surgery

## 2011-10-18 DIAGNOSIS — D381 Neoplasm of uncertain behavior of trachea, bronchus and lung: Secondary | ICD-10-CM

## 2011-10-18 DIAGNOSIS — J984 Other disorders of lung: Secondary | ICD-10-CM | POA: Insufficient documentation

## 2011-10-18 DIAGNOSIS — Z01818 Encounter for other preprocedural examination: Secondary | ICD-10-CM | POA: Insufficient documentation

## 2011-10-18 DIAGNOSIS — Z01810 Encounter for preprocedural cardiovascular examination: Secondary | ICD-10-CM | POA: Insufficient documentation

## 2011-10-18 DIAGNOSIS — Z01812 Encounter for preprocedural laboratory examination: Secondary | ICD-10-CM | POA: Insufficient documentation

## 2011-10-18 HISTORY — DX: Flushing: R23.2

## 2011-10-18 HISTORY — DX: Pneumonia, unspecified organism: J18.9

## 2011-10-18 HISTORY — DX: Personal history of other diseases of the nervous system and sense organs: Z86.69

## 2011-10-18 HISTORY — DX: Irritable bowel syndrome, unspecified: K58.9

## 2011-10-18 HISTORY — DX: Other specified disorders of bone density and structure, unspecified site: M85.80

## 2011-10-18 HISTORY — DX: Other specified postprocedural states: Z98.890

## 2011-10-18 HISTORY — DX: Nausea with vomiting, unspecified: R11.2

## 2011-10-18 HISTORY — DX: Unspecified cataract: H26.9

## 2011-10-18 HISTORY — DX: Other specified diseases of liver: K76.89

## 2011-10-18 LAB — COMPREHENSIVE METABOLIC PANEL
ALT: 13 U/L (ref 0–35)
AST: 16 U/L (ref 0–37)
Albumin: 3.9 g/dL (ref 3.5–5.2)
Alkaline Phosphatase: 52 U/L (ref 39–117)
BUN: 14 mg/dL (ref 6–23)
CO2: 22 mEq/L (ref 19–32)
Calcium: 9.6 mg/dL (ref 8.4–10.5)
Chloride: 100 mEq/L (ref 96–112)
Creatinine, Ser: 0.71 mg/dL (ref 0.50–1.10)
GFR calc Af Amer: >90 mL/min (ref >90)
GFR calc non Af Amer: 87 mL/min — ABNORMAL LOW (ref >90)
Glucose, Bld: 94 mg/dL (ref 70–99)
Potassium: 4.1 mEq/L (ref 3.5–5.1)
Sodium: 134 mEq/L — ABNORMAL LOW (ref 135–145)
Total Bilirubin: 0.5 mg/dL (ref 0.3–1.2)
Total Protein: 6.4 g/dL (ref 6.0–8.3)

## 2011-10-18 LAB — BLOOD GAS, ARTERIAL
Acid-Base Excess: 0.2 mmol/L (ref 0.0–2.0)
Bicarbonate: 23.6 mEq/L (ref 20.0–24.0)
Drawn by: 344381
FIO2: 0.21 %
O2 Saturation: 96.2 %
Patient temperature: 98.6
TCO2: 24.6 mmol/L (ref 0–100)
pCO2 arterial: 33.1 mmHg — ABNORMAL LOW (ref 35.0–45.0)
pH, Arterial: 7.466 — ABNORMAL HIGH (ref 7.350–7.400)
pO2, Arterial: 73 mmHg — ABNORMAL LOW (ref 80.0–100.0)

## 2011-10-18 LAB — URINALYSIS, ROUTINE W REFLEX MICROSCOPIC
Bilirubin Urine: NEGATIVE
Glucose, UA: NEGATIVE mg/dL
Hgb urine dipstick: NEGATIVE
Ketones, ur: NEGATIVE mg/dL
Leukocytes, UA: NEGATIVE
Nitrite: NEGATIVE
Protein, ur: NEGATIVE mg/dL
Specific Gravity, Urine: 1.026 (ref 1.005–1.030)
Urobilinogen, UA: 0.2 mg/dL (ref 0.0–1.0)
pH: 6 (ref 5.0–8.0)

## 2011-10-18 LAB — SURGICAL PCR SCREEN
MRSA, PCR: NEGATIVE
Staphylococcus aureus: NEGATIVE

## 2011-10-18 LAB — CBC
HCT: 38 % (ref 36.0–46.0)
Hemoglobin: 13.6 g/dL (ref 12.0–15.0)
MCH: 33.3 pg (ref 26.0–34.0)
MCHC: 35.8 g/dL (ref 30.0–36.0)
MCV: 92.9 fL (ref 78.0–100.0)
Platelets: 164 10*3/uL (ref 150–400)
RBC: 4.09 MIL/uL (ref 3.87–5.11)
RDW: 11.6 % (ref 11.5–15.5)
WBC: 7.3 10*3/uL (ref 4.0–10.5)

## 2011-10-18 LAB — TYPE AND SCREEN
ABO/RH(D): A POS
Antibody Screen: NEGATIVE

## 2011-10-18 LAB — PROTIME-INR
INR: 0.98 (ref 0.00–1.49)
Prothrombin Time: 13.2 seconds (ref 11.6–15.2)

## 2011-10-18 LAB — APTT: aPTT: 31 seconds (ref 24–37)

## 2011-10-18 NOTE — Pre-Procedure Instructions (Signed)
20 [name]  10/18/2011   Your procedure is scheduled on:  Wed, Mar 13 @ 0830  Report to [facility] at 0630 AM.  Call this number if you have problems the morning of surgery: [phone]   Remember:   Do not eat food:After Midnight.  May have clear liquids: up to 4 Hours before arrival.(until 2:30 am)  Clear liquids include soda, tea, black coffee, apple or grape juice, broth,water  Take these medicines the morning of surgery with A SIP OF WATER: Spiriva,Symbicort,Albuterol(if needed<Bring your inhaler with you> Amlodipine,Prozac,Flonase(if needed)   Do not wear jewelry, make-up or nail polish.  Do not wear lotions, powders, or perfumes.   Do not shave 48 hours prior to surgery.  Do not bring valuables to the hospital.  Contacts, dentures or bridgework may not be worn into surgery.  Leave suitcase in the car. After surgery it may be brought to your room.  For patients admitted to the hospital, checkout time is 11:00 AM the day of discharge.     Special Instructions: CHG Shower Use Special Wash: 1/2 bottle night before surgery and 1/2 bottle morning of surgery.   Please read over the following fact sheets that you were given: Pain Booklet, Coughing and Deep Breathing, Blood Transfusion Information, MRSA Information and Surgical Site Infection Prevention

## 2011-10-18 NOTE — Progress Notes (Signed)
Cardiologist is Dr.Berry and last office visit a week ago as well as stress test-to request records  Denies echo or heart cath

## 2011-10-18 NOTE — Progress Notes (Signed)
[address]            [place] [address]          [phone]        [name] [facility] Medical Record #[id number] Date of Birth: 1941-06-15 Seen: 09-30-2011 Referring: [name], MD Primary Care: Henri Medal, MD, MD  Chief Complaint:    Chief Complaint  Patient presents with  . Follow-up    to further discuss thoracic surgery after cardiac clearance from Dr. Nanetta Batty    History of Present Illness:    Patient with history of COPD referred to pulmonary consultation for COPD. She denies hemoptysis2, does have SOB with exertion but is functional and able to do yard work and climb stairs. On review of previous CT in 2011 after hip replacement  by  Dr Marchelle Gearing on visit 09/09/2011 demonstrated 7x 10 mm rt upper lung nodule. Patient ws unaware of this. Repeat CT scan has been done, and patient referred to Thoracic surgery I last saw her to discuss resection of the lung nodule. She is without cardiac symptoms but did have extensive coranary calcification on ct scan so was referred for cardiac clearence.   Current Activity/ Functional Status: Patient is independent with mobility/ambulation, transfers, ADL's, IADL's. independent with mobility/ambulation, transfers, ADL's, IADL's.   Past Medical History  Diagnosis Date  . COPD (chronic obstructive pulmonary disease)   . Depression   . Anxiety   . Gout     last time 6-42yrs ago   . Hepatic cyst   . Migraine   . OCD (obsessive compulsive disorder)   . Osteoporosis   . Tinea corporis   . Acute bronchitis   . Allergic rhinitis   . Chronic diarrhea of unknown origin   . Gout   . Macrocytosis   . Skin rash   . Flushing   . Need for prophylactic vaccination and inoculation against influenza   . Anemia   . Benign paroxysmal positional vertigo   . PONV (postoperative nausea and vomiting)     hard to wake  . Hypertension     takes Amlodipine nightly    . Hyperlipidemia     taking Flax Seed Oil and Fish Oil  . Shortness of breath     with exertion  . Emphysema   . Pneumonia     x 2 ;last time back in 1999  . Hx of migraines 90's    after menopause HA stopped  . Arthritis     all over  . Joint pain   . Joint swelling   . Osteopenia     takes Calcium and Vit D bid  . Back pain   . Dry skin   . Liver cyst   . IBS (irritable bowel syndrome)   . Diarrhea   . Blood transfusion     at age 29  . Bilateral cataracts   . Hot flashes     takes Prozac daily  . Lung nodule     right    Past Surgical History  Procedure Date  . Inguinal hernia repair 2000  . Neuroplasty decompression median nerve at carpal tunnel   . Rotator cuff repair 2001    right  . Tonsillectomy   . Tonsillectomy as a child  and adenoids  . Dilation and curettage of uterus     at age 65  . Cyst removed >60yrs ago    from left leg  . Carpal tunnel release 80's    right   . Total abdominal hysterectomy 2003  . Total hip arthroplasty 2011  . Colonoscopy     Family History  Problem Relation Age of Onset  . Alcohol abuse    . Depression    . Hearing loss    . Heart disease    . Hypertension    . Anesthesia problems Neg Hx   . Hypotension Neg Hx   . Malignant hyperthermia Neg Hx   . Pseudochol deficiency Neg Hx     History   Social History  . Marital Status: Married    Spouse Name: N/A    Number of Children: N/A  . Years of Education: N/A   Occupational History  . retired    Social History Main Topics  . Smoking status: Former Smoker -- 0.5 packs/day for 50 years    Types: Cigarettes    Quit date: 09/23/2011  . Smokeless tobacco: Never Used  . Alcohol Use: 1.8 oz/week    3 Cans of beer per week     social  . Drug Use: No  . Sexually Active: Not on file      History  Smoking status  . Former Smoker -- 0.5 packs/day for 50 years  . Types: Cigarettes  . Quit date: 09/23/2011  Smokeless tobacco  . Never Used  Comment:  quit smoking about a month ago    History  Alcohol Use  . 0.0 oz/week    2 beers every night     Allergies  Allergen Reactions  . Clindamycin/Lincomycin Nausea And Vomiting  . Penicillins Other (See Comments)    Unknown   . Sulfa Antibiotics Rash    Current Outpatient Prescriptions  Medication Sig Dispense Refill  . acetaminophen (TYLENOL) 650 MG CR tablet Take 1,300 mg by mouth 2 (two) times daily.        Marland Kitchen amLODipine (NORVASC) 5 MG tablet Take 5 mg by mouth daily.        . budesonide-formoterol (SYMBICORT) 160-4.5 MCG/ACT inhaler Inhale 2 puffs into the lungs 2 (two) times daily.  1 Inhaler  12  . FLUoxetine (PROZAC) 20 MG tablet Take 20 mg by mouth 2 (two) times daily.       . Glucosamine 500 MG CAPS Take 1 capsule by mouth 2 (two) times daily.       Marland Kitchen tiotropium (SPIRIVA HANDIHALER) 18 MCG inhalation capsule Place 1 capsule (18 mcg total) into inhaler and inhale daily.  30 capsule  12  . albuterol (PROVENTIL HFA;VENTOLIN HFA) 108 (90 BASE) MCG/ACT inhaler Inhale 2 puffs into the lungs every 6 (six) hours as needed. For wheezing      . calcium-vitamin D (OSCAL WITH D) 500-200 MG-UNIT per tablet Take 1 tablet by mouth 2 (two) times daily.      . diphenhydrAMINE (SOMINEX) 25 MG tablet Take 25 mg by mouth daily as needed. For allergies      . fluticasone (FLONASE) 50 MCG/ACT nasal spray Place 2 sprays into the nose daily as needed. For stuffy nose      . Multiple Vitamin (MULITIVITAMIN WITH MINERALS) TABS Take 1 tablet by mouth daily.           Review of Systems:     Cardiac Review of Systems: Y or N  Chest Pain [  n  ]  Resting SOB [n   ] Exertional SOB  Cove.Etienne  ]  Orthopnea Milo.Brash  ]   Pedal Edema Milo.Brash   ]    Palpitations Milo.Brash  ] Syncope  Milo.Brash  ]   Presyncope Milo.Brash   ]  General Review of Systems: [Y] = yes [  ]=no Constitional: recent weight change [  ]; anorexia [  ]; fatigue [  ]; nausea [  ]; night sweats [ n ]; fever [n  ]; or chills [ n ];                                                                                                                                           Dental: poor dentition[n  ];   Eye : blurred vision [ y ]; diplopia Milo.Brash   ]; vision changes [ y ];  Amaurosis fugax[n  ]; Resp: cough [  ];  wheezing[  ];  hemoptysis[  ]; shortness of breath[  ]; paroxysmal nocturnal dyspnea[  ]; dyspnea on exertion[  ]; or orthopnea[  ];  GI:  gallstones[  ], vomiting[  ];  dysphagia[  ]; melena[  ];  hematochezia [  ]; heartburn[  ];   Hx of  Colonoscopy[  ]; GU: kidney stones [  ]; hematuria[n  ];   dysuria [  ];  nocturia[  ];  history of     obstruction [  ];             Skin: rash, swelling[  ];, hair loss[  ];  peripheral edema[n  ];  or itching[  ]; Musculosketetal: myalgias[n  ];  joint swelling[  ];  joint erythema[  ];  joint pain[  ];  back pain[  ];  Heme/Lymph: bruising[  ];  bleeding[  ];  anemia[  ];  Neuro: TIA[  ];  headaches[  ];  stroke[  ];  vertigo[n  ];  seizures[ n ];   paresthesias[  ];  difficulty walking[  ];  Psych:depression[  ]; anxiety[  ];  Endocrine: diabetes[  ];  thyroid dysfunction[n  ];  Immunizations: Flu Cove.Etienne  ]; Pneumococcal[ y ];  Other:  Physical Exam: BP 110/76  Pulse 77  Resp 16  Ht 5\' 4"  (1.626 m)  Wt 130 lb (58.968 kg)  BMI 22.31 kg/m2  SpO2 96% at rest on room air  General appearance: alert, cooperative, appears older than stated age and no distress Neurologic: intact Heart: regular rate and rhythm, S1, S2 normal, no murmur, click, rub or gallop Lungs: clear to auscultation bilaterally Abdomen: soft, non-tender; bowel sounds normal; no masses,  no organomegaly Extremities: extremities normal, atraumatic, no cyanosis or edema No cervical or axillary lymphonapathy  Diagnostic Studies & Laboratory data:     Recent Radiology Findings:  Ct Chest W Contrast  09/13/2011  *RADIOLOGY REPORT*  Clinical Data: Follow-up  lung nodule from February 2011.  No current chest complaints.  History of COPD.  CT CHEST WITH CONTRAST   Technique:  Multidetector CT imaging of the chest was performed following the standard protocol during bolus administration of intravenous contrast.  Contrast: 80mL OMNIPAQUE IOHEXOL 300 MG/ML IV SOLN  Comparison: Chest CT 09/11/2009.  Findings:  Mediastinum: Heart size is normal. There is atherosclerosis of the thoracic aorta, the great vessels of the mediastinum and the coronary arteries, including calcified atherosclerotic plaque in the left main, left anterior descending, left circumflex and right coronary arteries. There is no significant pericardial fluid, thickening or pericardial calcification. No pathologically enlarged mediastinal or hilar lymph nodes. Esophagus is unremarkable in appearance.  Lungs/Pleura: When compared to prior examination from 09/11/2009, the previously noted right upper lobe nodule has significantly increased in size, currently measuring 2.3 x 1.8 cm (image 23 of series 3), now demonstrating macrolobulated margins.  A background of mild diffuse bronchial wall thickening and moderate-severe centrilobular emphysema (most pronounced at the lung apices) is again noted.  Scarring in the inferior segment lingula and medial aspects of the lung bases bilaterally.  No consolidative airspace disease.  No pleural effusions.  Upper Abdomen: Again noted are numerous low attenuation lesions scattered throughout the hepatic parenchyma which appears similar in size, number and distribution.  The largest of these is incompletely visualized within segment four and a (measuring at least 3.8 x 3.2 cm).  More cephalad within segment four there is a rim calcified lesion that also appears unchanged.  Remaining visualized portions of the upper abdomen are otherwise unremarkable.  Musculoskeletal: There are no aggressive appearing lytic or blastic lesions noted in the visualized portions of the skeleton.  IMPRESSION:  1.  Significant interval enlargement of a macrolobulated nodule in the right upper lobe when  compared to remote prior study.  This lesion currently measures 2.3 x 1.8 cm.  At this time, no definite mediastinal or hilar adenopathy is appreciated, and there are no other definite nodules identified.  This is suspicious for a primary bronchogenic neoplasm, and at this point appears to represent T1b, N0, Mx disease. Further evaluation with PET CT or CT- guided percutaneous needle biopsy is recommended. 2. Atherosclerosis, including left main and three-vessel coronary artery disease. Please note that although the presence of coronary artery calcium documents the presence of coronary artery disease, the severity of this disease and any potential stenosis cannot be assessed on this non-gated CT examination.  Assessment for potential risk factor modification, dietary therapy or pharmacologic therapy may be warranted, if clinically indicated. 3.  Moderate-severe central lobular emphysema redemonstrated. 4.  Multiple low-attenuation hepatic lesions appears similar to the prior examination.  The larger of these lesions appear consistent with cysts, while the smaller lesions are too small to definitively characterize (however, their stability over time would suggest benign lesions such as cysts or small biliary hamartomas).  These results will be called to the ordering clinician or representative by the Radiologist Assistant, and communication documented in the PACS Dashboard.  Original Report Authenticated By: Florencia Reasons, M.D.   Nm Pet Image Initial (pi) Skull Base To Thigh  09/21/2011  *RADIOLOGY REPORT*  Clinical Data:  Initial treatment strategy for right lung nodule.  NUCLEAR MEDICINE PET CT INITIAL (PI) SKULL BASE TO THIGH  Technique:  17.3 mCi F-18 FDG was injected intravenously via the right antecubital fossa.  Full-ring PET imaging was performed from the skull base through the mid-thighs 60   minutes after injection.  CT  data was obtained and used for attenuation correction and anatomic localization  only.  (This was not acquired as a diagnostic CT examination.)  Fasting Blood Glucose:  103  Patient Weight:  130 pounds.  Comparison:  None.  Findings: No enlarged or hypermetabolic supraclavicular lymph nodes.  There are no hypermetabolic axillary lymph nodes.  No enlarged mediastinal or hilar lymph nodes.  There are calcifications within the LAD, RCA, and left circumflex coronary arteries.  Lungs are emphysematous.  The pulmonary nodule within the right upper lobe measures 2 x 1.6 x 1.8 cm.  There is intense FDG uptake associated with this nodule with an SUV max equal to 12.6.  There are no additional hypermetabolic pulmonary parenchymal nodules or masses identified.  There are multiple stones within the lumen of the gallbladder.  No secondary signs of acute cholecystitis.  There is no biliary dilatation.  Changes of polycystic liver disease noted.  The pancreas appears within normal limits.  The spleen appears within normal limits.  No enlarged or hypermetabolic upper abdominal lymph nodes.  There is no pelvic or inguinal adenopathy.  IMPRESSION:  1.  Pulmonary nodule within the right upper lobe exhibits malignant range FDG uptake and is worrisome for primary lung neoplasm. 2.  There are no specific features to suggest metastatic disease. If this is biopsied and is a non small cell lung cancer then this would be considered a stagedT1aN0M0 3.  Gallstones.  Original Report Authenticated By: [name], M.D.    Dg Chest 2 View  10/18/2011  *RADIOLOGY REPORT*  Clinical Data: Preop for VATS, smoking history  CHEST - 2 VIEW  Comparison: CT chest of 09/13/2011  Findings: The oval nodule within the right upper lobe is again well visualized and most consistent with primary lung carcinoma.  The lungs are hyperaerated consistent with COPD.  Mediastinal contours are stable.  The heart is mildly enlarged.  There are degenerative changes in the mid to lower thoracic spine.  IMPRESSION:  Oval nodule in the inferior  right upper lobe is again noted worrisome for primary lung carcinoma.  COPD.  Original Report Authenticated By: Juline Patch, M.D.   Mr Laqueta Jean Wo Contrast  10/06/2011  *RADIOLOGY REPORT*  Clinical Data: New diagnosis of lung cancer.  Evaluate for metastatic disease.  MRI HEAD WITHOUT AND WITH CONTRAST  Technique:  Multiplanar, multiecho pulse sequences of the brain and surrounding structures were obtained according to standard protocol without and with intravenous contrast  Contrast: 12mL MULTIHANCE GADOBENATE DIMEGLUMINE 529 MG/ML IV SOLN  Comparison: None.  Findings: No intracranial enhancing mass or bony destructive lesion to suggest presence of intracranial metastatic disease.  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  Global atrophy.  Ventricular prominence felt to be related to atrophy rather than hydrocephalus.  No acute infarct.  Mild small vessel disease type changes.  Exophthalmos.  Mild mucosal thickening ethmoid sinus air cells.  IMPRESSION: No evidence of intracranial metastatic disease.  Please see above.  Original Report Authenticated By: Fuller Canada, M.D.   NRecent Lab Findings: Lab Results  Component Value Date   WBC 7.3 10/18/2011   HGB 13.6 10/18/2011   HCT 38.0 10/18/2011   PLT 164 10/18/2011   GLUCOSE 94 10/18/2011   ALT 13 10/18/2011   AST 16 10/18/2011   NA 134* 10/18/2011   K 4.1 10/18/2011   CL 100 10/18/2011   CREATININE 0.71 10/18/2011  BUN 14 10/18/2011   CO2 22 10/18/2011   INR 0.98 10/18/2011   PFTS Done in [place] Office: FVC 2.58 98% FEV1 1.13 60% DLCO  56%    Assessment / Plan:   1. Enlarging rt Upper lobe lung Nodule, elevated SUV on PET clinical T1N0  Lung cancer 2. Severe Obstructive Lung Disease with decrease diffusing capacity, but very functional  3. Low risk stress test by Dr Allyson Sabal done today  Plan to proceed with Bronchscopy, Rt Vats  and lung resection.  Risks and options discussed , patient knows she is at some increased risk secondary to pulmonary disease.She has stopped smoking. The goals risks and alternatives of the planned surgical procedure Lung resection on the right and bronchoscopy  have been discussed with the patient in detail. The risks of the procedure including death, infection, stroke, myocardial infarction, bleeding, blood transfusion, prolonged air leak have all been discussed specifically.  I have quoted [name] a 5 % of perioperative mortality and a complication rate as high as 20%. The patient's questions have been answered.[name] is willing  to proceed with the planned procedure.  Plan to Proceed 10/20/2011 Wednesday   [name] MD  Beeper [phone] Office [phone]

## 2011-10-19 ENCOUNTER — Other Ambulatory Visit (HOSPITAL_COMMUNITY): Payer: Medicare Other

## 2011-10-19 ENCOUNTER — Ambulatory Visit: Payer: Medicare Other | Admitting: Cardiovascular Disease

## 2011-10-19 MED ORDER — VANCOMYCIN HCL IN DEXTROSE 1-5 GM/200ML-% IV SOLN
1000.0000 mg | INTRAVENOUS | Status: AC
  Administered 2011-10-20: 1000 mg via INTRAVENOUS
  Filled 2011-10-19: qty 200

## 2011-10-20 ENCOUNTER — Encounter (HOSPITAL_COMMUNITY)
Admission: RE | Disposition: A | Discharge status: Home / Self Care | Payer: Self-pay | Source: Ambulatory Visit | Attending: Cardiothoracic Surgery

## 2011-10-20 ENCOUNTER — Ambulatory Visit: Payer: Medicare Other | Admitting: Internal Medicine

## 2011-10-20 ENCOUNTER — Encounter (HOSPITAL_COMMUNITY): Payer: Self-pay | Admitting: Anesthesiology

## 2011-10-20 ENCOUNTER — Ambulatory Visit (HOSPITAL_COMMUNITY): Payer: Medicare Other

## 2011-10-20 ENCOUNTER — Ambulatory Visit (HOSPITAL_COMMUNITY): Payer: Medicare Other | Admitting: Anesthesiology

## 2011-10-20 ENCOUNTER — Encounter (HOSPITAL_COMMUNITY): Payer: Self-pay | Admitting: Surgery

## 2011-10-20 ENCOUNTER — Inpatient Hospital Stay (HOSPITAL_COMMUNITY)
Admission: RE | Admit: 2011-10-20 | Discharge: 2011-10-24 | DRG: 164 | Disposition: A | Discharge status: Home / Self Care | Payer: Medicare Other | Source: Ambulatory Visit | Attending: Cardiothoracic Surgery | Admitting: Cardiothoracic Surgery

## 2011-10-20 ENCOUNTER — Other Ambulatory Visit: Payer: Self-pay

## 2011-10-20 DIAGNOSIS — T8182XA Emphysema (subcutaneous) resulting from a procedure, initial encounter: Secondary | ICD-10-CM | POA: Diagnosis not present

## 2011-10-20 DIAGNOSIS — E785 Hyperlipidemia, unspecified: Secondary | ICD-10-CM | POA: Diagnosis present

## 2011-10-20 DIAGNOSIS — J438 Other emphysema: Secondary | ICD-10-CM | POA: Diagnosis present

## 2011-10-20 DIAGNOSIS — Y9210 Unspecified residential institution as the place of occurrence of the external cause: Secondary | ICD-10-CM | POA: Diagnosis not present

## 2011-10-20 DIAGNOSIS — F341 Dysthymic disorder: Secondary | ICD-10-CM | POA: Diagnosis present

## 2011-10-20 DIAGNOSIS — D381 Neoplasm of uncertain behavior of trachea, bronchus and lung: Secondary | ICD-10-CM

## 2011-10-20 DIAGNOSIS — I4729 Other ventricular tachycardia: Secondary | ICD-10-CM | POA: Diagnosis not present

## 2011-10-20 DIAGNOSIS — I10 Essential (primary) hypertension: Secondary | ICD-10-CM | POA: Diagnosis present

## 2011-10-20 DIAGNOSIS — C3410 Malignant neoplasm of upper lobe, unspecified bronchus or lung: Principal | ICD-10-CM | POA: Diagnosis present

## 2011-10-20 DIAGNOSIS — F429 Obsessive-compulsive disorder, unspecified: Secondary | ICD-10-CM | POA: Diagnosis present

## 2011-10-20 DIAGNOSIS — M129 Arthropathy, unspecified: Secondary | ICD-10-CM | POA: Diagnosis present

## 2011-10-20 DIAGNOSIS — K589 Irritable bowel syndrome without diarrhea: Secondary | ICD-10-CM | POA: Diagnosis present

## 2011-10-20 DIAGNOSIS — E871 Hypo-osmolality and hyponatremia: Secondary | ICD-10-CM | POA: Diagnosis present

## 2011-10-20 DIAGNOSIS — Z87891 Personal history of nicotine dependence: Secondary | ICD-10-CM

## 2011-10-20 DIAGNOSIS — Z96649 Presence of unspecified artificial hip joint: Secondary | ICD-10-CM

## 2011-10-20 DIAGNOSIS — J309 Allergic rhinitis, unspecified: Secondary | ICD-10-CM | POA: Diagnosis present

## 2011-10-20 DIAGNOSIS — D7589 Other specified diseases of blood and blood-forming organs: Secondary | ICD-10-CM

## 2011-10-20 DIAGNOSIS — I472 Ventricular tachycardia, unspecified: Secondary | ICD-10-CM | POA: Diagnosis not present

## 2011-10-20 DIAGNOSIS — M810 Age-related osteoporosis without current pathological fracture: Secondary | ICD-10-CM | POA: Diagnosis present

## 2011-10-20 DIAGNOSIS — Y836 Removal of other organ (partial) (total) as the cause of abnormal reaction of the patient, or of later complication, without mention of misadventure at the time of the procedure: Secondary | ICD-10-CM | POA: Diagnosis not present

## 2011-10-20 DIAGNOSIS — Z79899 Other long term (current) drug therapy: Secondary | ICD-10-CM

## 2011-10-20 HISTORY — PX: VIDEO BRONCHOSCOPY: SHX5072

## 2011-10-20 HISTORY — PX: WEDGE RESECTION: SHX5070

## 2011-10-20 SURGERY — VIDEO ASSISTED THORACOSCOPY (VATS)/WEDGE RESECTION
Anesthesia: General | Site: Chest | Laterality: Right | Wound class: Clean Contaminated

## 2011-10-20 MED ORDER — TIOTROPIUM BROMIDE MONOHYDRATE 18 MCG IN CAPS
18.0000 ug | ORAL_CAPSULE | Freq: Every day | RESPIRATORY_TRACT | Status: DC
  Administered 2011-10-21 – 2011-10-24 (×4): 18 ug via RESPIRATORY_TRACT
  Filled 2011-10-20: qty 5

## 2011-10-20 MED ORDER — OXYCODONE HCL 5 MG PO TABS: 5.0000 mg | ORAL_TABLET | ORAL | Status: AC | PRN

## 2011-10-20 MED ORDER — HYDROMORPHONE HCL PF 1 MG/ML IJ SOLN
0.2500 mg | INTRAMUSCULAR | Status: DC | PRN
  Administered 2011-10-20 (×4): 0.5 mg via INTRAVENOUS

## 2011-10-20 MED ORDER — FLUOXETINE HCL 20 MG PO TABS: 20.0000 mg | ORAL_TABLET | Freq: Two times a day (BID) | ORAL | Status: DC

## 2011-10-20 MED ORDER — TRAMADOL HCL 50 MG PO TABS
50.0000 mg | ORAL_TABLET | Freq: Four times a day (QID) | ORAL | Status: DC | PRN
  Administered 2011-10-22 (×2): 100 mg via ORAL
  Administered 2011-10-23: 50 mg via ORAL
  Administered 2011-10-23 – 2011-10-24 (×4): 100 mg via ORAL
  Filled 2011-10-20 (×3): qty 2
  Filled 2011-10-20: qty 1
  Filled 2011-10-20 (×3): qty 2

## 2011-10-20 MED ORDER — NALOXONE HCL 0.4 MG/ML IJ SOLN: 0.4000 mg | INTRAMUSCULAR | Status: DC | PRN

## 2011-10-20 MED ORDER — BIOTENE DRY MOUTH MT LIQD
15.0000 mL | Freq: Two times a day (BID) | OROMUCOSAL | Status: DC
  Administered 2011-10-20 – 2011-10-22 (×4): 15 mL via OROMUCOSAL

## 2011-10-20 MED ORDER — BUPIVACAINE 0.5 % ON-Q PUMP SINGLE CATH 400 ML
400.0000 mL | INJECTION | Status: DC
  Filled 2011-10-20: qty 400

## 2011-10-20 MED ORDER — ALBUTEROL SULFATE HFA 108 (90 BASE) MCG/ACT IN AERS
2.0000 | INHALATION_SPRAY | Freq: Four times a day (QID) | RESPIRATORY_TRACT | Status: DC | PRN
  Filled 2011-10-20: qty 6.7

## 2011-10-20 MED ORDER — SODIUM CHLORIDE 0.9 % IJ SOLN
INTRAMUSCULAR | Status: AC
  Filled 2011-10-20: qty 10

## 2011-10-20 MED ORDER — FLUOXETINE HCL 20 MG PO CAPS
20.0000 mg | ORAL_CAPSULE | Freq: Two times a day (BID) | ORAL | Status: DC
  Administered 2011-10-20 – 2011-10-24 (×8): 20 mg via ORAL
  Filled 2011-10-20 (×9): qty 1

## 2011-10-20 MED ORDER — FENTANYL 10 MCG/ML IV SOLN
INTRAVENOUS | Status: DC
  Administered 2011-10-20: 30 ug via INTRAVENOUS
  Administered 2011-10-21: 20 ug via INTRAVENOUS
  Administered 2011-10-21: 10 ug via INTRAVENOUS
  Administered 2011-10-21: 20 ug via INTRAVENOUS
  Administered 2011-10-21: 30 ug via INTRAVENOUS
  Administered 2011-10-22: 10 ug via INTRAVENOUS
  Administered 2011-10-22: 30 ug via INTRAVENOUS
  Filled 2011-10-20: qty 50

## 2011-10-20 MED ORDER — SODIUM CHLORIDE 0.9 % IJ SOLN: 9.0000 mL | INTRAMUSCULAR | Status: DC | PRN

## 2011-10-20 MED ORDER — HYDROMORPHONE HCL PF 1 MG/ML IJ SOLN
INTRAMUSCULAR | Status: AC
  Filled 2011-10-20: qty 1

## 2011-10-20 MED ORDER — DIPHENHYDRAMINE HCL (SLEEP) 25 MG PO TABS: 25.0000 mg | ORAL_TABLET | Freq: Every day | ORAL | Status: DC | PRN

## 2011-10-20 MED ORDER — DIPHENHYDRAMINE HCL 25 MG PO CAPS: 25.0000 mg | ORAL_CAPSULE | Freq: Every day | ORAL | Status: DC | PRN

## 2011-10-20 MED ORDER — POTASSIUM CHLORIDE 10 MEQ/50ML IV SOLN
10.0000 meq | Freq: Every day | INTRAVENOUS | Status: DC | PRN
  Administered 2011-10-21 – 2011-10-22 (×4): 10 meq via INTRAVENOUS
  Filled 2011-10-20 (×4): qty 50

## 2011-10-20 MED ORDER — ONDANSETRON HCL 4 MG/2ML IJ SOLN: 4.0000 mg | Freq: Four times a day (QID) | INTRAMUSCULAR | Status: DC | PRN

## 2011-10-20 MED ORDER — KCL IN DEXTROSE-NACL 20-5-0.45 MEQ/L-%-% IV SOLN
INTRAVENOUS | Status: DC
  Administered 2011-10-20 (×2): via INTRAVENOUS
  Filled 2011-10-20 (×4): qty 1000

## 2011-10-20 MED ORDER — AMLODIPINE BESYLATE 5 MG PO TABS
5.0000 mg | ORAL_TABLET | Freq: Every day | ORAL | Status: DC
  Administered 2011-10-21 – 2011-10-24 (×4): 5 mg via ORAL
  Filled 2011-10-20 (×5): qty 1

## 2011-10-20 MED ORDER — DIPHENHYDRAMINE HCL 50 MG/ML IJ SOLN: 12.5000 mg | Freq: Four times a day (QID) | INTRAMUSCULAR | Status: DC | PRN

## 2011-10-20 MED ORDER — OXYCODONE-ACETAMINOPHEN 5-325 MG PO TABS: 1.0000 | ORAL_TABLET | ORAL | Status: DC | PRN

## 2011-10-20 MED ORDER — PROPOFOL 10 MG/ML IV EMUL
INTRAVENOUS | Status: DC | PRN
  Administered 2011-10-20: 200 mg via INTRAVENOUS

## 2011-10-20 MED ORDER — ONDANSETRON HCL 4 MG/2ML IJ SOLN: 4.0000 mg | Freq: Once | INTRAMUSCULAR | Status: DC | PRN

## 2011-10-20 MED ORDER — FENTANYL 10 MCG/ML IV SOLN
INTRAVENOUS | Status: DC
  Administered 2011-10-20: 12:00:00 via INTRAVENOUS
  Filled 2011-10-20 (×2): qty 50

## 2011-10-20 MED ORDER — LACTATED RINGERS IV SOLN
INTRAVENOUS | Status: DC | PRN
  Administered 2011-10-20: 08:00:00 via INTRAVENOUS

## 2011-10-20 MED ORDER — EPHEDRINE SULFATE 50 MG/ML IJ SOLN
INTRAMUSCULAR | Status: DC | PRN
  Administered 2011-10-20: 10 mg via INTRAVENOUS

## 2011-10-20 MED ORDER — LIDOCAINE HCL 4 % MT SOLN
OROMUCOSAL | Status: DC | PRN
  Administered 2011-10-20: 4 mL via TOPICAL

## 2011-10-20 MED ORDER — HYDROMORPHONE HCL PF 1 MG/ML IJ SOLN: 0.2500 mg | INTRAMUSCULAR | Status: DC | PRN

## 2011-10-20 MED ORDER — SENNOSIDES-DOCUSATE SODIUM 8.6-50 MG PO TABS
1.0000 | ORAL_TABLET | Freq: Every evening | ORAL | Status: DC | PRN
  Filled 2011-10-20: qty 1

## 2011-10-20 MED ORDER — DIPHENHYDRAMINE HCL 12.5 MG/5ML PO ELIX
12.5000 mg | ORAL_SOLUTION | Freq: Four times a day (QID) | ORAL | Status: DC | PRN
  Filled 2011-10-20: qty 5

## 2011-10-20 MED ORDER — BUDESONIDE-FORMOTEROL FUMARATE 160-4.5 MCG/ACT IN AERO
2.0000 | INHALATION_SPRAY | Freq: Two times a day (BID) | RESPIRATORY_TRACT | Status: DC
  Administered 2011-10-21 – 2011-10-24 (×7): 2 via RESPIRATORY_TRACT
  Filled 2011-10-20: qty 6

## 2011-10-20 MED ORDER — FLUTICASONE PROPIONATE 50 MCG/ACT NA SUSP
2.0000 | Freq: Every day | NASAL | Status: DC | PRN
  Filled 2011-10-20: qty 16

## 2011-10-20 MED ORDER — VANCOMYCIN HCL IN DEXTROSE 1-5 GM/200ML-% IV SOLN
1000.0000 mg | Freq: Two times a day (BID) | INTRAVENOUS | Status: AC
  Administered 2011-10-20: 1000 mg via INTRAVENOUS
  Filled 2011-10-20: qty 200

## 2011-10-20 MED ORDER — ROCURONIUM BROMIDE 100 MG/10ML IV SOLN
INTRAVENOUS | Status: DC | PRN
  Administered 2011-10-20 (×2): 25 mg via INTRAVENOUS

## 2011-10-20 MED ORDER — CALCIUM CARBONATE-VITAMIN D 500-200 MG-UNIT PO TABS
1.0000 | ORAL_TABLET | Freq: Two times a day (BID) | ORAL | Status: DC
  Administered 2011-10-20 – 2011-10-24 (×8): 1 via ORAL
  Filled 2011-10-20 (×10): qty 1

## 2011-10-20 MED ORDER — ACETAMINOPHEN 10 MG/ML IV SOLN
1000.0000 mg | Freq: Four times a day (QID) | INTRAVENOUS | Status: AC
  Administered 2011-10-20 – 2011-10-21 (×4): 1000 mg via INTRAVENOUS
  Filled 2011-10-20 (×4): qty 100

## 2011-10-20 MED ORDER — VECURONIUM BROMIDE 10 MG IV SOLR
INTRAVENOUS | Status: DC | PRN
  Administered 2011-10-20: 2 mg via INTRAVENOUS

## 2011-10-20 MED ORDER — BISACODYL 5 MG PO TBEC
10.0000 mg | DELAYED_RELEASE_TABLET | Freq: Every day | ORAL | Status: DC
  Administered 2011-10-22 – 2011-10-23 (×2): 10 mg via ORAL
  Filled 2011-10-20 (×4): qty 2

## 2011-10-20 MED ORDER — FENTANYL CITRATE 0.05 MG/ML IJ SOLN
INTRAMUSCULAR | Status: DC | PRN
  Administered 2011-10-20 (×5): 50 ug via INTRAVENOUS
  Administered 2011-10-20: 100 ug via INTRAVENOUS
  Administered 2011-10-20: 50 ug via INTRAVENOUS

## 2011-10-20 MED ORDER — NEOSTIGMINE METHYLSULFATE 1 MG/ML IJ SOLN
INTRAMUSCULAR | Status: DC | PRN
  Administered 2011-10-20: 3 mg via INTRAVENOUS

## 2011-10-20 MED ORDER — ADULT MULTIVITAMIN W/MINERALS CH
1.0000 | ORAL_TABLET | Freq: Every day | ORAL | Status: DC
  Administered 2011-10-21 – 2011-10-24 (×4): 1 via ORAL
  Filled 2011-10-20 (×5): qty 1

## 2011-10-20 MED ORDER — KCL IN DEXTROSE-NACL 20-5-0.45 MEQ/L-%-% IV SOLN
INTRAVENOUS | Status: AC
  Filled 2011-10-20: qty 1000

## 2011-10-20 MED ORDER — GLYCOPYRROLATE 0.2 MG/ML IJ SOLN
INTRAMUSCULAR | Status: DC | PRN
  Administered 2011-10-20: 0.4 mg via INTRAVENOUS

## 2011-10-20 MED ORDER — BUPIVACAINE ON-Q PAIN PUMP (FOR ORDER SET NO CHG)
INJECTION | Status: DC
  Filled 2011-10-20: qty 1

## 2011-10-20 MED ORDER — ONDANSETRON HCL 4 MG/2ML IJ SOLN
INTRAMUSCULAR | Status: DC | PRN
  Administered 2011-10-20: 4 mg via INTRAVENOUS

## 2011-10-20 SURGICAL SUPPLY — 79 items
APPLICATOR TIP COSEAL (VASCULAR PRODUCTS) IMPLANT
APPLICATOR TIP EXT COSEAL (VASCULAR PRODUCTS) IMPLANT
BLADE SURG 11 STRL SS (BLADE) ×3 IMPLANT
BRUSH CYTOL CELLEBRITY 1.5X140 (MISCELLANEOUS) IMPLANT
CANISTER SUCTION 2500CC (MISCELLANEOUS) ×3 IMPLANT
CATH KIT ON Q 5IN SLV (PAIN MANAGEMENT) ×3 IMPLANT
CATH THORACIC 28FR (CATHETERS) IMPLANT
CATH THORACIC 36FR (CATHETERS) IMPLANT
CATH THORACIC 36FR RT ANG (CATHETERS) IMPLANT
CLIP TI MEDIUM 6 (CLIP) ×3 IMPLANT
CLOTH BEACON ORANGE TIMEOUT ST (SAFETY) ×3 IMPLANT
CONT SPEC 4OZ CLIKSEAL STRL BL (MISCELLANEOUS) ×9 IMPLANT
COVER TABLE BACK 60X90 (DRAPES) IMPLANT
DERMABOND ADVANCED (GAUZE/BANDAGES/DRESSINGS) ×1
DERMABOND ADVANCED .7 DNX12 (GAUZE/BANDAGES/DRESSINGS) ×2 IMPLANT
DRAPE LAPAROSCOPIC ABDOMINAL (DRAPES) ×3 IMPLANT
DRAPE PROXIMA HALF (DRAPES) ×3 IMPLANT
DRAPE SLUSH MACHINE 52X66 (DRAPES) IMPLANT
DRAPE SLUSH/WARMER DISC (DRAPES) ×3 IMPLANT
DRILL BIT 7/64X5 (BIT) ×3 IMPLANT
ELECT REM PT RETURN 9FT ADLT (ELECTROSURGICAL) ×3
ELECTRODE REM PT RTRN 9FT ADLT (ELECTROSURGICAL) ×2 IMPLANT
FORCEPS BIOP RJ4 1.8 (CUTTING FORCEPS) IMPLANT
GLOVE BIO SURGEON STRL SZ 6 (GLOVE) ×6 IMPLANT
GLOVE BIO SURGEON STRL SZ 6.5 (GLOVE) ×15 IMPLANT
GLOVE BIOGEL PI IND STRL 7.0 (GLOVE) ×8 IMPLANT
GLOVE BIOGEL PI INDICATOR 7.0 (GLOVE) ×4
GLOVE SURG SIGNA 7.5 PF LTX (GLOVE) ×3 IMPLANT
GLOVE SURG SS PI 6.5 STRL IVOR (GLOVE) ×6 IMPLANT
GOWN BRE IMP PREV XXLGXLNG (GOWN DISPOSABLE) ×3 IMPLANT
GOWN STRL NON-REIN LRG LVL3 (GOWN DISPOSABLE) ×18 IMPLANT
HANDLE STAPLE ENDO GIA SHORT (STAPLE) ×1
KIT BASIN OR (CUSTOM PROCEDURE TRAY) ×3 IMPLANT
KIT ROOM TURNOVER OR (KITS) ×3 IMPLANT
KIT SUCTION CATH 14FR (SUCTIONS) ×3 IMPLANT
MARKER SKIN DUAL TIP RULER LAB (MISCELLANEOUS) IMPLANT
NEEDLE BIOPSY TRANSBRONCH 21G (NEEDLE) IMPLANT
NS IRRIG 1000ML POUR BTL (IV SOLUTION) ×9 IMPLANT
OIL SILICONE PENTAX (PARTS (SERVICE/REPAIRS)) IMPLANT
PACK CHEST (CUSTOM PROCEDURE TRAY) ×3 IMPLANT
PAD ARMBOARD 7.5X6 YLW CONV (MISCELLANEOUS) ×6 IMPLANT
RELOAD EGIA 45 MED/THCK PURPLE (STAPLE) ×15 IMPLANT
RELOAD EGIA 45 TAN VASC (STAPLE) ×3 IMPLANT
SEALANT PROGEL (MISCELLANEOUS) IMPLANT
SEALANT SURG COSEAL 4ML (VASCULAR PRODUCTS) IMPLANT
SEALANT SURG COSEAL 8ML (VASCULAR PRODUCTS) IMPLANT
SOLUTION ANTI FOG 6CC (MISCELLANEOUS) ×3 IMPLANT
SPONGE GAUZE 4X4 12PLY (GAUZE/BANDAGES/DRESSINGS) ×3 IMPLANT
STAPLER ENDO GIA 12MM SHORT (STAPLE) ×2 IMPLANT
STRIP PERI DRY VERITAS 45 (STAPLE) ×18 IMPLANT
SUT PROLENE 3 0 SH DA (SUTURE) IMPLANT
SUT PROLENE 4 0 RB 1 (SUTURE)
SUT PROLENE 4-0 RB1 .5 CRCL 36 (SUTURE) IMPLANT
SUT SILK  1 MH (SUTURE) ×2
SUT SILK 1 MH (SUTURE) ×4 IMPLANT
SUT SILK 2 0SH CR/8 30 (SUTURE) ×3 IMPLANT
SUT SILK 3 0 SH CR/8 (SUTURE) ×3 IMPLANT
SUT SILK 3 0SH CR/8 30 (SUTURE) IMPLANT
SUT VIC AB 1 CTX 18 (SUTURE) ×6 IMPLANT
SUT VIC AB 1 CTX 36 (SUTURE) ×1
SUT VIC AB 1 CTX36XBRD ANBCTR (SUTURE) ×2 IMPLANT
SUT VIC AB 2-0 CTX 36 (SUTURE) IMPLANT
SUT VIC AB 2-0 UR6 27 (SUTURE) IMPLANT
SUT VIC AB 3-0 SH 8-18 (SUTURE) ×3 IMPLANT
SUT VIC AB 3-0 X1 27 (SUTURE) ×6 IMPLANT
SUT VICRYL 2 TP 1 (SUTURE) ×3 IMPLANT
SWAB COLLECTION DEVICE MRSA (MISCELLANEOUS) IMPLANT
SYR 20ML ECCENTRIC (SYRINGE) ×3 IMPLANT
SYSTEM SAHARA CHEST DRAIN ATS (WOUND CARE) ×3 IMPLANT
TAPE CLOTH SURG 4X10 WHT LF (GAUZE/BANDAGES/DRESSINGS) ×3 IMPLANT
TIP APPLICATOR SPRAY EXTEND 16 (VASCULAR PRODUCTS) IMPLANT
TOWEL OR 17X24 6PK STRL BLUE (TOWEL DISPOSABLE) ×3 IMPLANT
TOWEL OR 17X26 10 PK STRL BLUE (TOWEL DISPOSABLE) ×9 IMPLANT
TRAP SPECIMEN MUCOUS 40CC (MISCELLANEOUS) ×3 IMPLANT
TRAY FOLEY CATH 14FRSI W/METER (CATHETERS) ×3 IMPLANT
TUBE ANAEROBIC SPECIMEN COL (MISCELLANEOUS) IMPLANT
TUBE CONNECTING 12X1/4 (SUCTIONS) ×3 IMPLANT
TUNNELER SHEATH ON-Q 11GX8 (MISCELLANEOUS) ×3 IMPLANT
WATER STERILE IRR 1000ML POUR (IV SOLUTION) ×6 IMPLANT

## 2011-10-20 NOTE — Transfer of Care (Signed)
Immediate Anesthesia Transfer of Care Note  Patient: [name]  Procedure(s) Performed: Procedure(s) (LRB): VIDEO ASSISTED THORACOSCOPY (VATS)/WEDGE RESECTION (Right) VIDEO BRONCHOSCOPY (N/A)  Patient Location: PACU  Anesthesia Type: General  Level of Consciousness: awake, alert  and oriented  Airway & Oxygen Therapy: Patient Spontanous Breathing and Patient connected to nasal cannula oxygen  Post-op Assessment: Report given to PACU RN, Post -op Vital signs reviewed and stable and Patient moving all extremities  Post vital signs: Reviewed and stable  Complications: No apparent anesthesia complications

## 2011-10-20 NOTE — Anesthesia Preprocedure Evaluation (Addendum)
Anesthesia Evaluation  Patient identified by MRN, date of birth, ID band Patient awake    Reviewed: Allergy & Precautions, H&P , NPO status , Patient's Chart, lab work & pertinent test results  Airway Mallampati: I TM Distance: >3 FB Neck ROM: full    Dental   Pulmonary shortness of breath, pneumonia , COPDCurrent Smoker,          Cardiovascular hypertension, Rhythm:regular Rate:Normal     Neuro/Psych  Headaches, PSYCHIATRIC DISORDERS    GI/Hepatic   Endo/Other    Renal/GU      Musculoskeletal   Abdominal   Peds  Hematology   Anesthesia Other Findings   Reproductive/Obstetrics                          Anesthesia Physical Anesthesia Plan  ASA: II  Anesthesia Plan: General ETT   Post-op Pain Management:    Induction: Intravenous  Airway Management Planned: Double Lumen EBT  Additional Equipment: Arterial line and CVP  Intra-op Plan:   Post-operative Plan:   Informed Consent: I have reviewed the patients History and Physical, chart, labs and discussed the procedure including the risks, benefits and alternatives for the proposed anesthesia with the patient or authorized representative who has indicated his/her understanding and acceptance.     Plan Discussed with: Anesthesiologist, CRNA and Surgeon  Anesthesia Plan Comments:         Anesthesia Quick Evaluation

## 2011-10-20 NOTE — H&P (Signed)
[address]            [place] [address]          [phone]       [name] [facility] Medical Record #[id number] Date of Birth: [birth year] Seen: 09-30-2011 Referring: Dr Lavinia Sharps Primary Care: Henri Medal, MD, MD  Chief Complaint:    Lung Nodule rt lung   History of Present Illness:    Patient with history of COPD referred to pulmonary consultation for COPD. She denies hemoptysis2, does have SOB with exertion but is functional and able to do yard work and climb stairs. On review of previous CT in 2011 after hip replacement  by  Dr Marchelle Gearing on visit 09/09/2011 demonstrated 7x 10 mm rt upper lung nodule. Patient ws unaware of this. Repeat CT scan has been done, and patient referred to Thoracic surgery I last saw her to discuss resection of the lung nodule. She is without cardiac symptoms but did have extensive coranary calcification on ct scan so was referred for cardiac clearence.   Current Activity/ Functional Status: Patient is independent with mobility/ambulation, transfers, ADL's, IADL's. independent with mobility/ambulation, transfers, ADL's, IADL's.   Past Medical History  Diagnosis Date  . COPD (chronic obstructive pulmonary disease)   . Depression   . Anxiety   . Gout     last time 6-73yrs ago   . Hepatic cyst   . Migraine   . OCD (obsessive compulsive disorder)   . Osteoporosis   . Tinea corporis   . Acute bronchitis   . Allergic rhinitis   . Chronic diarrhea of unknown origin   . Gout   . Macrocytosis   . Skin rash   . Flushing   . Need for prophylactic vaccination and inoculation against influenza   . Anemia   . Benign paroxysmal positional vertigo   . PONV (postoperative nausea and vomiting)     hard to wake  . Hypertension     takes Amlodipine nightly  . Hyperlipidemia     taking Flax Seed Oil and Fish Oil  . Shortness of breath     with exertion  . Emphysema   . Pneumonia     x 2  ;last time back in 1999  . Hx of migraines 90's    after menopause HA stopped  . Arthritis     all over  . Joint pain   . Joint swelling   . Osteopenia     takes Calcium and Vit D bid  . Back pain   . Dry skin   . Liver cyst   . IBS (irritable bowel syndrome)   . Diarrhea   . Blood transfusion     at age 71  . Bilateral cataracts   . Hot flashes     takes Prozac daily  . Lung nodule     right    Past Surgical History  Procedure Date  . Inguinal hernia repair 2000  . Neuroplasty decompression median nerve at carpal tunnel   . Rotator cuff repair 2001    right  . Tonsillectomy   . Tonsillectomy as a child    and adenoids  . Dilation and curettage of uterus     at age 71  . Cyst removed >70yrs ago    from  left leg  . Carpal tunnel release 80's    right   . Total abdominal hysterectomy 2003  . Total hip arthroplasty 2011  . Colonoscopy     Family History  Problem Relation Age of Onset  . Alcohol abuse    . Depression    . Hearing loss    . Heart disease    . Hypertension    . Anesthesia problems Neg Hx   . Hypotension Neg Hx   . Malignant hyperthermia Neg Hx   . Pseudochol deficiency Neg Hx     History   Social History  . Marital Status: Married    Spouse Name: N/A    Number of Children: N/A  . Years of Education: N/A   Occupational History  . retired    Social History Main Topics  . Smoking status: Former Smoker -- 0.5 packs/day for 50 years    Types: Cigarettes    Quit date: 09/23/2011  . Smokeless tobacco: Never Used  . Alcohol Use: 1.8 oz/week    3 Cans of beer per week     social  . Drug Use: No  . Sexually Active: Not on file      History  Smoking status  . Former Smoker -- 0.5 packs/day for 50 years  . Types: Cigarettes  . Quit date: 09/23/2011  Smokeless tobacco  . Never Used  Comment: quit smoking about a month ago    History  Alcohol Use  . 0.0 oz/week    2 beers every night     Allergies  Allergen Reactions  .  Clindamycin/Lincomycin Nausea And Vomiting  . Penicillins Other (See Comments)    Unknown   . Sulfa Antibiotics Rash    Current Facility-Administered Medications  Medication Dose Route Frequency Provider Last Rate Last Dose  . vancomycin (VANCOCIN) IVPB 1000 mg/200 mL premix  1,000 mg Intravenous 60 min Pre-Op [name], MD       Facility-Administered Medications Ordered in Other Encounters  Medication Dose Route Frequency Provider Last Rate Last Dose  . lactated ringers infusion    Continuous PRN Fuller Canada, CRNA       Prescriptions prior to admission  Medication Sig Dispense Refill  . acetaminophen (TYLENOL) 650 MG CR tablet Take 1,300 mg by mouth 2 (two) times daily.        Marland Kitchen albuterol (PROVENTIL HFA;VENTOLIN HFA) 108 (90 BASE) MCG/ACT inhaler Inhale 2 puffs into the lungs every 6 (six) hours as needed. For wheezing      . amLODipine (NORVASC) 5 MG tablet Take 5 mg by mouth daily.        . budesonide-formoterol (SYMBICORT) 160-4.5 MCG/ACT inhaler Inhale 2 puffs into the lungs 2 (two) times daily.  1 Inhaler  12  . calcium-vitamin D (OSCAL WITH D) 500-200 MG-UNIT per tablet Take 1 tablet by mouth 2 (two) times daily.      . diphenhydrAMINE (SOMINEX) 25 MG tablet Take 25 mg by mouth daily as needed. For allergies      . FLUoxetine (PROZAC) 20 MG tablet Take 20 mg by mouth 2 (two) times daily.       . fluticasone (FLONASE) 50 MCG/ACT nasal spray Place 2 sprays into the nose daily as needed. For stuffy nose      . Glucosamine 500 MG CAPS Take 1 capsule by mouth 2 (two) times daily.       . Multiple Vitamin (MULITIVITAMIN WITH MINERALS) TABS Take 1 tablet by mouth daily.      Marland Kitchen  tiotropium (SPIRIVA HANDIHALER) 18 MCG inhalation capsule Place 1 capsule (18 mcg total) into inhaler and inhale daily.  30 capsule  12      Review of Systems:     Cardiac Review of Systems: Y or N  Chest Pain [  n  ]  Resting SOB [n   ] Exertional SOB  Cove.Etienne  ]  Orthopnea Milo.Brash  ]   Pedal Edema Milo.Brash    ]    Palpitations Milo.Brash  ] Syncope  Milo.Brash  ]   Presyncope Milo.Brash   ]  General Review of Systems: [Y] = yes [  ]=no Constitional: recent weight change [  ]; anorexia [  ]; fatigue [  ]; nausea [  ]; night sweats [ n ]; fever [n  ]; or chills [ n ];                                                                                                                                          Dental: poor dentition[n  ];   Eye : blurred vision [ y ]; diplopia Milo.Brash   ]; vision changes [ y ];  Amaurosis fugax[n  ]; Resp: cough [  ];  wheezing[  ];  hemoptysis[  ]; shortness of breath[  ]; paroxysmal nocturnal dyspnea[  ]; dyspnea on exertion[  ]; or orthopnea[  ];  GI:  gallstones[  ], vomiting[  ];  dysphagia[  ]; melena[  ];  hematochezia [  ]; heartburn[  ];   Hx of  Colonoscopy[  ]; GU: kidney stones [  ]; hematuria[n  ];   dysuria [  ];  nocturia[  ];  history of     obstruction [  ];             Skin: rash, swelling[  ];, hair loss[  ];  peripheral edema[n  ];  or itching[  ]; Musculosketetal: myalgias[n  ];  joint swelling[  ];  joint erythema[  ];  joint pain[  ];  back pain[  ];  Heme/Lymph: bruising[  ];  bleeding[  ];  anemia[  ];  Neuro: TIA[  ];  headaches[  ];  stroke[  ];  vertigo[n  ];  seizures[ n ];   paresthesias[  ];  difficulty walking[  ];  Psych:depression[  ]; anxiety[  ];  Endocrine: diabetes[  ];  thyroid dysfunction[n  ];  Immunizations: Flu Cove.Etienne  ]; Pneumococcal[ y ];  Other:  Physical Exam: BP 128/79  Pulse 60  Temp(Src) 98 F (36.7 C) (Oral)  Resp 18  SpO2 93% at rest on room air  General appearance: alert, cooperative, appears older than stated age and no distress Neurologic: intact Heart: regular rate and rhythm, S1, S2 normal, no murmur, click, rub or gallop Lungs: clear to auscultation bilaterally Abdomen: soft, non-tender; bowel sounds normal; no masses,  no organomegaly Extremities: extremities  normal, atraumatic, no cyanosis or edema No cervical or axillary  lymphonapathy  Diagnostic Studies & Laboratory data:     Recent Radiology Findings:  Ct Chest W Contrast  09/13/2011  *RADIOLOGY REPORT*  Clinical Data: Follow-up lung nodule from February 2011.  No current chest complaints.  History of COPD.  CT CHEST WITH CONTRAST  Technique:  Multidetector CT imaging of the chest was performed following the standard protocol during bolus administration of intravenous contrast.  Contrast: 80mL OMNIPAQUE IOHEXOL 300 MG/ML IV SOLN  Comparison: Chest CT 09/11/2009.  Findings:  Mediastinum: Heart size is normal. There is atherosclerosis of the thoracic aorta, the great vessels of the mediastinum and the coronary arteries, including calcified atherosclerotic plaque in the left main, left anterior descending, left circumflex and right coronary arteries. There is no significant pericardial fluid, thickening or pericardial calcification. No pathologically enlarged mediastinal or hilar lymph nodes. Esophagus is unremarkable in appearance.  Lungs/Pleura: When compared to prior examination from 09/11/2009, the previously noted right upper lobe nodule has significantly increased in size, currently measuring 2.3 x 1.8 cm (image 23 of series 3), now demonstrating macrolobulated margins.  A background of mild diffuse bronchial wall thickening and moderate-severe centrilobular emphysema (most pronounced at the lung apices) is again noted.  Scarring in the inferior segment lingula and medial aspects of the lung bases bilaterally.  No consolidative airspace disease.  No pleural effusions.  Upper Abdomen: Again noted are numerous low attenuation lesions scattered throughout the hepatic parenchyma which appears similar in size, number and distribution.  The largest of these is incompletely visualized within segment four and a (measuring at least 3.8 x 3.2 cm).  More cephalad within segment four there is a rim calcified lesion that also appears unchanged.  Remaining visualized portions of the  upper abdomen are otherwise unremarkable.  Musculoskeletal: There are no aggressive appearing lytic or blastic lesions noted in the visualized portions of the skeleton.  IMPRESSION:  1.  Significant interval enlargement of a macrolobulated nodule in the right upper lobe when compared to remote prior study.  This lesion currently measures 2.3 x 1.8 cm.  At this time, no definite mediastinal or hilar adenopathy is appreciated, and there are no other definite nodules identified.  This is suspicious for a primary bronchogenic neoplasm, and at this point appears to represent T1b, N0, Mx disease. Further evaluation with PET CT or CT- guided percutaneous needle biopsy is recommended. 2. Atherosclerosis, including left main and three-vessel coronary artery disease. Please note that although the presence of coronary artery calcium documents the presence of coronary artery disease, the severity of this disease and any potential stenosis cannot be assessed on this non-gated CT examination.  Assessment for potential risk factor modification, dietary therapy or pharmacologic therapy may be warranted, if clinically indicated. 3.  Moderate-severe central lobular emphysema redemonstrated. 4.  Multiple low-attenuation hepatic lesions appears similar to the prior examination.  The larger of these lesions appear consistent with cysts, while the smaller lesions are too small to definitively characterize (however, their stability over time would suggest benign lesions such as cysts or small biliary hamartomas).  These results will be called to the ordering clinician or representative by the Radiologist Assistant, and communication documented in the PACS Dashboard.  Original Report Authenticated By: Florencia Reasons, M.D.   Nm Pet Image Initial (pi) Skull Base To Thigh  09/21/2011  *RADIOLOGY REPORT*  Clinical Data:  Initial treatment strategy for right lung nodule.  NUCLEAR MEDICINE PET CT INITIAL (PI) SKULL BASE  TO THIGH   Technique:  17.3 mCi F-18 FDG was injected intravenously via the right antecubital fossa.  Full-ring PET imaging was performed from the skull base through the mid-thighs 60   minutes after injection.  CT data was obtained and used for attenuation correction and anatomic localization only.  (This was not acquired as a diagnostic CT examination.)  Fasting Blood Glucose:  103  Patient Weight:  130 pounds.  Comparison:  None.  Findings: No enlarged or hypermetabolic supraclavicular lymph nodes.  There are no hypermetabolic axillary lymph nodes.  No enlarged mediastinal or hilar lymph nodes.  There are calcifications within the LAD, RCA, and left circumflex coronary arteries.  Lungs are emphysematous.  The pulmonary nodule within the right upper lobe measures 2 x 1.6 x 1.8 cm.  There is intense FDG uptake associated with this nodule with an SUV max equal to 12.6.  There are no additional hypermetabolic pulmonary parenchymal nodules or masses identified.  There are multiple stones within the lumen of the gallbladder.  No secondary signs of acute cholecystitis.  There is no biliary dilatation.  Changes of polycystic liver disease noted.  The pancreas appears within normal limits.  The spleen appears within normal limits.  No enlarged or hypermetabolic upper abdominal lymph nodes.  There is no pelvic or inguinal adenopathy.  IMPRESSION:  1.  Pulmonary nodule within the right upper lobe exhibits malignant range FDG uptake and is worrisome for primary lung neoplasm. 2.  There are no specific features to suggest metastatic disease. If this is biopsied and is a non small cell lung cancer then this would be considered a stagedT1aN0M0 3.  Gallstones.  Original Report Authenticated By: [name], M.D.    Dg Chest 2 View  10/18/2011  *RADIOLOGY REPORT*  Clinical Data: Preop for VATS, smoking history  CHEST - 2 VIEW  Comparison: CT chest of 09/13/2011  Findings: The oval nodule within the right upper lobe is again well  visualized and most consistent with primary lung carcinoma.  The lungs are hyperaerated consistent with COPD.  Mediastinal contours are stable.  The heart is mildly enlarged.  There are degenerative changes in the mid to lower thoracic spine.  IMPRESSION:  Oval nodule in the inferior right upper lobe is again noted worrisome for primary lung carcinoma.  COPD.  Original Report Authenticated By: Juline Patch, M.D.   Mr Laqueta Jean Wo Contrast  10/06/2011  *RADIOLOGY REPORT*  Clinical Data: New diagnosis of lung cancer.  Evaluate for metastatic disease.  MRI HEAD WITHOUT AND WITH CONTRAST  Technique:  Multiplanar, multiecho pulse sequences of the brain and surrounding structures were obtained according to standard protocol without and with intravenous contrast  Contrast: 12mL MULTIHANCE GADOBENATE DIMEGLUMINE 529 MG/ML IV SOLN  Comparison: None.  Findings: No intracranial enhancing mass or bony destructive lesion to suggest presence of intracranial metastatic disease.  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  Global atrophy.  Ventricular prominence felt to be related to atrophy rather than hydrocephalus.  No acute infarct.  Mild small vessel disease type changes.  Exophthalmos.  Mild mucosal thickening ethmoid sinus air cells.  IMPRESSION: No evidence of intracranial metastatic disease.  Please see above.  Original Report Authenticated By: Fuller Canada, M.D.   NRecent Lab Findings: Lab Results  Component Value Date   WBC 7.3 10/18/2011   HGB 13.6 10/18/2011   HCT 38.0 10/18/2011   PLT 164  10/18/2011   GLUCOSE 94 10/18/2011   ALT 13 10/18/2011   AST 16 10/18/2011   NA 134* 10/18/2011   K 4.1 10/18/2011   CL 100 10/18/2011   CREATININE 0.71 10/18/2011   BUN 14 10/18/2011   CO2 22 10/18/2011   INR 0.98 10/18/2011   PFTS Done in [facility]: FVC 2.58 98% FEV1 1.13 60% DLCO  56%    Assessment  / Plan:   1. Enlarging rt Upper lobe lung Nodule, elevated SUV on PET clinical T1N0  Lung cancer 2. Severe Obstructive Lung Disease with decrease diffusing capacity, but very functional  3. Low risk stress test by Dr Allyson Sabal done today  Plan to proceed with Bronchscopy, Rt Vats and lung resection.  Risks and options discussed , patient knows she is at some increased risk secondary to pulmonary disease.She has stopped smoking. The goals risks and alternatives of the planned surgical procedure Lung resection on the right and bronchoscopy  have been discussed with the patient in detail. The risks of the procedure including death, infection, stroke, myocardial infarction, bleeding, blood transfusion, prolonged air leak have all been discussed specifically.  I have quoted Violeta Gelinas a 5 % of perioperative mortality and a complication rate as high as 20%. The patient's questions have been answered.KAJAL SCALICI is willing  to proceed with the planned procedure.  Plan to Proceed today   [name] MD  Beeper [phone] Office [phone]

## 2011-10-20 NOTE — Brief Op Note (Addendum)
10/20/2011  10:40 AM  PATIENT:  [name]  71 y.o. 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

## 2011-10-20 NOTE — Progress Notes (Signed)
Primary care RN in pacu is Schuyler Amor, RN

## 2011-10-20 NOTE — Anesthesia Procedure Notes (Addendum)
Procedure Name: Intubation Date/Time: 10/20/2011 8:29 AM Performed by: Fuller Canada Pre-anesthesia Checklist: Patient identified, Timeout performed, Emergency Drugs available, Suction available and Patient being monitored Patient Re-evaluated:Patient Re-evaluated prior to inductionOxygen Delivery Method: Circle system utilized Preoxygenation: Pre-oxygenation with 100% oxygen Intubation Type: IV induction Ventilation: Mask ventilation without difficulty Laryngoscope Size: Mac and 3 Grade View: Grade I Tube type: Oral Tube size: 8.0 mm Number of attempts: 1 Airway Equipment and Method: Stylet Placement Confirmation: ETT inserted through vocal cords under direct vision,  breath sounds checked- equal and bilateral and positive ETCO2 Secured at: 21 cm Tube secured with: Tape Dental Injury: Teeth and Oropharynx as per pre-operative assessment    Date/Time: 10/20/2011 8:40 AM Performed by: Arlice Colt BRUNO Laryngoscope Size: Mac and 3 Grade View: Grade I Endobronchial tube: Left and 37 Fr Number of attempts: 1 Airway Equipment and Method: Stylet Placement Confirmation: ETT inserted through vocal cords under direct vision,  positive ETCO2 and breath sounds checked- equal and bilateral Tube secured with: Tape Dental Injury: Teeth and Oropharynx as per pre-operative assessment

## 2011-10-20 NOTE — Anesthesia Postprocedure Evaluation (Signed)
  Anesthesia Post-op Note  Patient: [name]  Procedure(s) Performed: Procedure(s) (LRB): VIDEO ASSISTED THORACOSCOPY (VATS)/WEDGE RESECTION (Right) VIDEO BRONCHOSCOPY (N/A)  Patient Location: PACU  Anesthesia Type: General  Level of Consciousness: awake, oriented and patient cooperative  Airway and Oxygen Therapy: Patient Spontanous Breathing and Patient connected to face mask oxygen  Post-op Pain: moderate  Post-op Assessment: Post-op Vital signs reviewed, Patient's Cardiovascular Status Stable, Respiratory Function Stable, Patent Airway, No signs of Nausea or vomiting and Pain level controlled  Post-op Vital Signs: stable  Complications: No apparent anesthesia complications

## 2011-10-20 NOTE — OR Nursing (Signed)
Bronchoscopy ended at [id number].  Vats incision was made at 0923.

## 2011-10-20 NOTE — Progress Notes (Signed)
UR Completed.  Keyler Hoge Jane [phone] 10/20/2011  

## 2011-10-20 NOTE — Progress Notes (Signed)
Providing lunch relief 

## 2011-10-21 ENCOUNTER — Inpatient Hospital Stay (HOSPITAL_COMMUNITY): Payer: Medicare Other

## 2011-10-21 LAB — BLOOD GAS, ARTERIAL
Drawn by: 31276
O2 Content: 2 L/min
O2 Saturation: 94 %
pO2, Arterial: 63.7 mmHg — ABNORMAL LOW (ref 80.0–100.0)

## 2011-10-21 LAB — CBC
Hemoglobin: 12.2 g/dL (ref 12.0–15.0)
MCHC: 35 g/dL (ref 30.0–36.0)
RBC: 3.67 MIL/uL — ABNORMAL LOW (ref 3.87–5.11)
WBC: 9.7 10*3/uL (ref 4.0–10.5)

## 2011-10-21 LAB — BASIC METABOLIC PANEL
Calcium: 8.4 mg/dL (ref 8.4–10.5)
Chloride: 96 mEq/L (ref 96–112)
Creatinine, Ser: 0.52 mg/dL (ref 0.50–1.10)
GFR calc Af Amer: >90 mL/min (ref >90)

## 2011-10-21 MED ORDER — SODIUM CHLORIDE 0.9 % IJ SOLN
10.0000 mL | INTRAMUSCULAR | Status: DC | PRN
  Administered 2011-10-22: 10 mL via INTRAVENOUS
  Filled 2011-10-21: qty 20
  Filled 2011-10-21: qty 10

## 2011-10-21 MED ORDER — ENOXAPARIN SODIUM 30 MG/0.3ML ~~LOC~~ SOLN
30.0000 mg | SUBCUTANEOUS | Status: DC
  Administered 2011-10-21 – 2011-10-24 (×4): 30 mg via SUBCUTANEOUS
  Filled 2011-10-21 (×5): qty 0.3

## 2011-10-21 MED ORDER — SODIUM CHLORIDE 0.9 % IJ SOLN
INTRAMUSCULAR | Status: AC
  Administered 2011-10-21: 10 mL via INTRAVENOUS
  Filled 2011-10-21: qty 10

## 2011-10-21 MED ORDER — KCL IN DEXTROSE-NACL 20-5-0.45 MEQ/L-%-% IV SOLN
INTRAVENOUS | Status: DC
  Administered 2011-10-21 (×2): 20 mL/h via INTRAVENOUS
  Filled 2011-10-21 (×2): qty 1000

## 2011-10-21 MED ORDER — SODIUM CHLORIDE 0.9 % IJ SOLN
10.0000 mL | Freq: Two times a day (BID) | INTRAMUSCULAR | Status: DC
  Administered 2011-10-21 – 2011-10-22 (×3): 10 mL via INTRAVENOUS
  Filled 2011-10-21: qty 10

## 2011-10-21 NOTE — Progress Notes (Addendum)
Patient ID: [name], female   DOB: 07-20-1941, 71 y.o.   MRN: [id number] TCTS DAILY PROGRESS NOTE                   [address]            [place] [address]          [phone]      1 Day Post-Op Procedure(s) (LRB): VIDEO ASSISTED THORACOSCOPY (VATS)/WEDGE RESECTION (Right) VIDEO BRONCHOSCOPY (N/A)  Total Length of Stay:  LOS: 1 day   Subjective: Good resp effort, no air leak  Objective: Vital signs in last 24 hours: Temp:  [97.2 F (36.2 C)-98.7 F (37.1 C)] 98.5 F (36.9 C) (03/14 0400) Pulse Rate:  [72-96] 76  (03/14 0400) Cardiac Rhythm:  [-] Normal sinus rhythm (03/14 0000) Resp:  [12-22] 13  (03/14 0740) BP: (98-141)/(60-86) 132/60 mmHg (03/14 0400) SpO2:  [96 %-100 %] 99 % (03/14 0740) Arterial Line BP: (143-150)/(62-73) 150/73 mmHg (03/14 0400) Weight:  [145 lb 4.5 oz (65.9 kg)] 145 lb 4.5 oz (65.9 kg) (03/13 1350)  Filed Weights   10/20/11 1350  Weight: 145 lb 4.5 oz (65.9 kg)    Weight change:    .    Intake/Output from previous day: 03/13 0701 - 03/14 0700 In: 2156.7 [I.V.:2156.7] Out: 1486 [Urine:1100; Emesis/NG output:1; Blood:100; Chest Tube:285]     Current Meds: Scheduled Meds:   . acetaminophen  1,000 mg Intravenous Q6H  . amLODipine  5 mg Oral Daily  . antiseptic oral rinse  15 mL Mouth Rinse BID  . bisacodyl  10 mg Oral Daily  . budesonide-formoterol  2 puff Inhalation BID  . calcium-vitamin D  1 tablet Oral BID  . fentaNYL   Intravenous Q4H  . fentaNYL   Intravenous To PACU  . FLUoxetine  20 mg Oral BID  . HYDROmorphone      . HYDROmorphone      . mulitivitamin with minerals  1 tablet Oral Daily  . sodium chloride      . tiotropium  18 mcg Inhalation Daily  . vancomycin  1,000 mg Intravenous 60 min Pre-Op  . vancomycin  1,000 mg Intravenous Q12H  . DISCONTD: bupivacaine 0.5 % ON-Q pump SINGLE CATH 400 mL  400 mL Other To OR  . DISCONTD: FLUoxetine  20 mg Oral BID   Continuous Infusions:   .  bupivacaine ON-Q pain pump    . dextrose 5 % and 0.45 % NaCl with KCl 20 mEq/L 100 mL/hr at 10/21/11 0700   PRN Meds:.albuterol, diphenhydrAMINE, diphenhydrAMINE, diphenhydrAMINE, fluticasone, naloxone, ondansetron (ZOFRAN) IV, oxyCODONE, oxyCODONE-acetaminophen, potassium chloride, senna-docusate, sodium chloride, traMADol, DISCONTD: diphenhydrAMINE, DISCONTD: HYDROmorphone, DISCONTD: HYDROmorphone, DISCONTD: ondansetron (ZOFRAN) IV, DISCONTD: ondansetron (ZOFRAN) IV, DISCONTD: ondansetron (ZOFRAN) IV  General appearance: alert, cooperative and no distress Neurologic: intact Heart: regular rate and rhythm, S1, S2 normal, no murmur, click, rub or gallop Lungs: clear to auscultation bilaterally Abdomen: soft, non-tender; bowel sounds normal; no masses,  no organomegaly Extremities: extremities normal, atraumatic, no cyanosis or edema and Homans sign is negative, no sign of DVT  Lab Results: CBC: Basename 10/21/11 0355 10/18/11 1347  WBC 9.7 7.3  HGB 12.2 13.6  HCT 34.9* 38.0  PLT 147* 164   BMET:  Basename 10/21/11 0355 10/18/11 1347  NA 127* 134*  K 3.7 4.1  CL 96 100  CO2 25 22  GLUCOSE 118* 94  BUN 7 14  CREATININE 0.52 0.71  CALCIUM 8.4 9.6  PT/INR:  Basename 10/18/11 1347  LABPROT 13.2  INR 0.98   Radiology: Dg Chest Port 1 View  10/21/2011  *RADIOLOGY REPORT*  Clinical Data: Postop; chest tube placement  PORTABLE CHEST - 1 VIEW  Comparison: October 20, 2011  Findings: The right chest tube is stable at the apex.  The right IJ central line tip is stable at the cavoatrial junction.  There is no discernible pneumothorax.  Right upper lung atelectasis is unchanged.  The left lung is clear.  The cardiac silhouette, mediastinum, pulmonary vasculature are within normal limits.  IMPRESSION: Stable chest x-ray with no evidence of pneumothorax.  Original Report Authenticated By: Brandon Melnick, M.D.   Dg Chest Portable 1 View  10/20/2011  *RADIOLOGY REPORT*  Clinical Data:  Postoperative studies status post right upper lobe wedge resection.Atherosclerotic calcifications in the arch of the aorta.  PORTABLE CHEST - 1 VIEW  Comparison: Chest x-ray 10/18/2011.  Findings: A right-sided chest tube is in place with tip at the apex and side port in the mid - upper right hemithorax.  No definite pneumothorax identified.  Right internal jugular central venous catheter with tip terminating in the mid superior vena cava.  Status post wedge resection of the right upper lobe pulmonary nodule.  There is a focal area of architectural distortion in the right upper lobe pulmonary parenchyma, likely represents resolving postoperative hemorrhage/edema.  Lungs are otherwise clear.  No definite pleural effusions.  Pulmonary vasculature is within normal limits.  Heart size is borderline enlarged. The patient is rotated to the right on today's exam, resulting in distortion of the mediastinal contours and reduced diagnostic sensitivity and specificity for mediastinal pathology.  IMPRESSION: 1.  Expected postoperative changes and support apparatus related to right upper lobe wedge resection, as above, without acute complicating features.  Original Report Authenticated By: Florencia Reasons, M.D.     Assessment/Plan: S/P Procedure(s) (LRB): VIDEO ASSISTED THORACOSCOPY (VATS)/WEDGE RESECTION (Right) VIDEO BRONCHOSCOPY (N/A) Mobilize chest tube no air leak to water seal Hyponatremia, will decrease Iv fluid and follow up labs    [name] MD  Beeper [phone] Office [phone] 10/21/2011 8:13 AM

## 2011-10-21 NOTE — Op Note (Signed)
NAMENIMRIT, KEHRES NO.:  [id number]  MEDICAL RECORD NO.:  [id number]  LOCATION:  3301                         FACILITY:  [facility]  PHYSICIAN:  [name], MD    DATE OF BIRTH:  1940-10-12  DATE OF PROCEDURE:  10/20/2011 DATE OF DISCHARGE:                              OPERATIVE REPORT   PREOPERATIVE DIAGNOSIS:  Right upper lobe 2-cm lung nodule, suspicious for carcinoma.  POSTOPERATIVE DIAGNOSIS:  Non-small-cell lung cancer.  PROCEDURE PERFORMED:  Video bronchoscopy, right video-assisted thoracoscopy with wedge and mini thoracotomy with wedge resection of right upper lobe lung nodule and mediastinal lymph node dissection.  SURGEON:  [name], MD  FIRST ASSISTANT:  Coral Ceo, PA  BRIEF HISTORY:  The patient is a 71 year old female, who radiographically over 2-year period had had increasing right upper lobe lung nodule.  The patient is a long-term smoker and with limited pulmonary reserve, FEV1 of 1, DLCO of 50%.  She had been referred to Dr. Marchelle Gearing for treatment of exacerbating COPD and his review of her old films, it became apparent that the lung nodule had been present for 2 years radiographically and had slowly increased in size, and she was referred for consideration of surgical resection and report was highly suspicious for lung cancer.  After the patient was counseled to stop smoking and did in mid February.  Because of significant calcification throughout her coronary artery tree, she underwent a preoperative cardiac evaluation.  The risks and options of surgical procedure, specially with her limited cardiopulmonary reserve was discussed with the patient and her husband in detail, and she was willing to proceed and signed informed consent.  DESCRIPTION OF THE PROCEDURE:  Appropriate time-out was performed.  With IV access established, the patient underwent general endotracheal anesthesia.  Through the endotracheal tube, a  fiberoptic bronchoscope was passed to the subsegmental level, both in the right and left bronchial tree, without evidence of endobronchial lesions.  The scope was removed.  The patient was turned in lateral decubitus position.  The right chest was prepped with Betadine and draped in sterile manner.  At approximately the 4th intercostal space in mid axillary line, a small incision was made, and a 30-degree video thoracoscope was passed into the chest and thorough examination of the pleural cavity was performed. There was no evidence of any pleural disease although the suspected lesion was not readily obvious on the surface of the lung.  This port incision was extended slightly and the lung was palpated, the 2-cm nodule was palpated in the inferior portion of the right upper lobe, just above the fissure but not crossing the fissure.  A second port site was placed anteriorly and a nodule was isolated, and with approximate 2- cm margin, the staples with reinforced strips were used to perform a wedge resection of the lung.  This was then submitted to pathology for frozen section, confirming non-small-cell lung cancer with negative margins.  The lung was then retracted anteriorly and 4R and 10R lymph nodes were dissected free and submitted separately to pathology for examination.  An On-Q catheter was placed subpleurally through a separate small site along the posterior axillary line and secured in place.  The anterior port incision was used to place a 28-chest tube which was secured in place.  The utilitarian incision was then closed with interrupted 0 Vicryl in the deep muscle layer, and interrupted 2-0 Vicryl in the subcutaneous tissue, 3-0 subcuticular stitch in skin edges.  Dermabond was applied.  Dry dressings were applied.  Sponge and needle counts were reported as correct at the completion of the procedure.  There was no air leak at the completion of procedure.  The patient had minimal  blood loss.  She was awakened and extubated in the operating room and transferred to the recovery room for further postoperative care.     [name], MD     EG/MEDQ  D:  10/21/2011  T:  10/21/2011  Job:  [id number]  cc:   [name], MD

## 2011-10-21 NOTE — Clinical Documentation Improvement (Signed)
GENERIC DOCUMENTATION CLARIFICATION QUERY  THIS DOCUMENT IS NOT A PERMANENT PART OF THE MEDICAL RECORD  TO RESPOND TO THE THIS QUERY, FOLLOW THE INSTRUCTIONS BELOW:  1. If needed, update documentation for the patient's encounter via the notes activity.  2. Access this query again and click edit on the In [employer].  3. After updating, or not, click F2 to complete all highlighted (required) fields concerning your review. Select "additional documentation in the medical record" OR "no additional documentation provided".  4. Click Sign note button.  5. The deficiency will fall out of your In Basket *Please let us know if you are not able to complete this workflow by phone or e-mail (listed below).  Please update your documentation within the medical record to reflect your response to this query.                                                                                        10/21/11   Dear Dr.Jessen Siegman / Associates,  In a better effort to capture your patient's severity of illness, reflect appropriate length of stay and utilization of resources, a review of the patient medical record has revealed the following indicators.    Based on your clinical judgment, please clarify and document in a progress note and/or discharge summary the clinical condition associated with the following supporting information:  In responding to this query please exercise your independent judgment.  The fact that a query is asked, does not imply that any particular answer is desired or expected.  Possible Clinical Conditions?  _______Hyponatremia  _______Other Condition  _______Cannot Clinically Determine   Supporting Information:  Diagnostics:  3/14:  Sodium=127 3/11:  Sodium=134  Treatment: 3/13: D5 1/2NS w/27meq kcl @ 164ml/hr  You may use possible, probable, or suspect with inpatient documentation. possible, probable, suspected diagnoses MUST be documented at the time of  discharge  Reviewed: additional documentation in the medical record  Thank You,  Marciano Sequin,  Clinical Documentation Specialist:  Pager: [phone]  Health Information Management [place]

## 2011-10-22 ENCOUNTER — Inpatient Hospital Stay (HOSPITAL_COMMUNITY): Payer: Medicare Other

## 2011-10-22 LAB — CBC
HCT: 35.9 % — ABNORMAL LOW (ref 36.0–46.0)
Hemoglobin: 12.5 g/dL (ref 12.0–15.0)
MCV: 94 fL (ref 78.0–100.0)
WBC: 7.6 10*3/uL (ref 4.0–10.5)

## 2011-10-22 LAB — COMPREHENSIVE METABOLIC PANEL
Alkaline Phosphatase: 44 U/L (ref 39–117)
BUN: 6 mg/dL (ref 6–23)
CO2: 29 mEq/L (ref 19–32)
Chloride: 97 mEq/L (ref 96–112)
GFR calc Af Amer: >90 mL/min (ref >90)
GFR calc non Af Amer: >90 mL/min (ref >90)
Glucose, Bld: 103 mg/dL — ABNORMAL HIGH (ref 70–99)
Potassium: 3.6 mEq/L (ref 3.5–5.1)
Total Bilirubin: 0.5 mg/dL (ref 0.3–1.2)

## 2011-10-22 MED ORDER — TRAMADOL HCL 50 MG PO TABS: 50.0000 mg | ORAL_TABLET | Freq: Four times a day (QID) | ORAL | Status: AC | PRN

## 2011-10-22 MED ORDER — SIMETHICONE 80 MG PO CHEW
80.0000 mg | CHEWABLE_TABLET | Freq: Four times a day (QID) | ORAL | Status: DC | PRN
  Administered 2011-10-22: 160 mg via ORAL
  Filled 2011-10-22: qty 2

## 2011-10-22 MED ORDER — POTASSIUM CHLORIDE CRYS ER 20 MEQ PO TBCR: 40.0000 meq | EXTENDED_RELEASE_TABLET | Freq: Once | ORAL | Status: DC

## 2011-10-22 MED ORDER — POTASSIUM CHLORIDE CRYS ER 20 MEQ PO TBCR
30.0000 meq | EXTENDED_RELEASE_TABLET | Freq: Once | ORAL | Status: AC
  Administered 2011-10-22: 30 meq via ORAL
  Filled 2011-10-22: qty 1

## 2011-10-22 NOTE — Progress Notes (Addendum)
2 Days Post-Op Procedure(s) (LRB): VIDEO ASSISTED THORACOSCOPY (VATS)/WEDGE RESECTION (Right) VIDEO BRONCHOSCOPY (N/A)  Subjective: Patient just finished breakfast. She has no complaints.  Objective: Vital signs in last 24 hours: Patient Vitals for the past 24 hrs:  BP Temp Temp src Pulse Resp SpO2  10/22/11 0700 - - - 90  23  90 %  10/22/11 0349 140/89 mmHg - Oral 86  20  95 %  10/21/11 2329 - - - - 16  95 %  10/21/11 2322 129/80 mmHg 98.7 F (37.1 C) Oral 85  16  94 %  10/21/11 2020 - - - - - 93 %  10/21/11 1925 116/73 mmHg - - 88  20  92 %  10/21/11 1919 - 98.4 F (36.9 C) Oral - - -  10/21/11 1914 - - - - 20  82 %  10/21/11 1640 - - - - 25  94 %  10/21/11 1600 136/71 mmHg 98.1 F (36.7 C) Oral 81  20  96 %  10/21/11 1200 140/77 mmHg 98.5 F (36.9 C) Oral 76  20  91 %  10/21/11 1119 - - - - 18  93 %    Current Weight  10/20/11 145 lb 4.5 oz (65.9 kg)      Intake/Output from previous day: 03/14 0701 - 03/15 0700 In: 2875.3 [P.O.:1080; I.V.:1595.3; IV Piggyback:200] Out: 3320 [Urine:3200; Chest Tube:120]   Physical Exam:  Cardiovascular: RRR, no murmurs, gallops, or rubs. Pulmonary:Diminished at bases; no rales, wheezes, or rhonchi. Abdomen: Soft, non tender, bowel sounds present. Extremities: No  lower extremity edema. Wounds: Clean and dry.  No erythema or signs of infection.  Lab Results: CBC: Basename 10/22/11 0335 10/21/11 0355  WBC 7.6 9.7  HGB 12.5 12.2  HCT 35.9* 34.9*  PLT 142* 147*   BMET:  Basename 10/22/11 0335 10/21/11 0355  NA 133* 127*  K 3.6 3.7  CL 97 96  CO2 29 25  GLUCOSE 103* 118*  BUN 6 7  CREATININE 0.61 0.52  CALCIUM 9.2 8.4    PT/INR: No results found for this basename: LABPROT,INR in the last 72 hours ABG:  INR: Will add last result for INR, ABG once components are confirmed Will add last 4 CBG results once components are confirmed  Assessment/Plan: 1.CV-had a brief run of NSVT this am. SR  now.Monitor. 2.Pulmonary-Chest tube had approximately 120 cc of output the last 24 hours.There is tidling in the chest tube with and without cough.CXR this am shows subcutaneous emphsema of the right chest wall, bibasilar atelectasis, questionable trace pneumothorax.Chest tube to remain to water seal for now.Check CXR am.Hope to remove chest tube in am. 3.Supplement Potassium. 4.Remove foley and On Q.  ZIMMERMAN,DONIELLE MPA-C 10/22/2011   Path discussed with patient  pT1b, pN0, M0    Stage 1 Plan dc ct sat poss home Sunday I have seen and examined [name] and agree with the above assessment  and plan.  [name] MD Beeper [phone] Office [phone] 10/22/2011 1:13 PM

## 2011-10-22 NOTE — Discharge Instructions (Signed)
ACTIVITY:  1.Increase activity slowly. 2.Walk daily and increase frequency and duration as tolerates. 3.May walk up steps. 4.No lifting more than ten pounds for two weeks. 5.No driving for two weeks. 6.Avoid straining. 7.STOP any activity that causes chest pain, shortness of breath, dizziness,sweating, or    excessive weakness. 8.Continue with breathing exercises daily.  DIET: Heart healthy  WOUND:  1.May shower. 2.Clean wounds with mild soap and water.  Call the office at [phone] if any problems arise.  Thoracotomy Care After Refer to this sheet in the next few weeks. These instructions provide you with information on caring for yourself after your procedure. Your caregiver may also give you more specific instructions. Your treatment has been planned according to current medical practices, but problems sometimes occur. Call your caregiver if you have any problems or questions after your procedure. HOME CARE INSTRUCTIONS  Remove the bandage (dressing) over your chest tube site as directed by your caregiver.   It is normal to be sore for a couple weeks following surgery. See your caregiver if this seems to be getting worse rather than better.   Only take over-the-counter or prescription medicines for pain, discomfort, or fever as directed by your caregiver. It is very important to take pain medicine when you need it so that you will cough and breathe deeply enough to clear mucus (phlegm) and expand your lungs. This helps prevent a lung infection (pneumonia).   If it hurts to cough, hold a pillow against your chest when you cough. This may help with the discomfort. In spite of the discomfort, cough frequently.   Taking deep breaths keeps lungs inflated and protects against pneumonia. Most patients will go home with a device called an incentive spirometer that encourages deep breathing.   You may resume a normal diet and activities as directed.   Use showers for bathing until you  see your caregiver, or as instructed.   Change dressings if necessary or as directed.   Avoid lifting or driving until you are instructed otherwise.   Make an appointment to see your caregiver for stitch (suture) or staple removal when instructed.   Do not travel by airplane for 2 weeks after the chest tube is removed.  SEEK MEDICAL CARE IF:  You are bleeding from your wounds.   Your heartbeat seems irregular.   You have redness, swelling, or increasing pain in the wounds.   There is pus coming from your wounds.   There is a bad smell coming from the wound or dressing.  SEEK IMMEDIATE MEDICAL CARE IF:  You have a fever.   You develop a rash.   You have difficulty breathing.   You develop any reaction or side effects to medicines given.   You develop lightheadedness or feel faint.   You develop shortness of breath or chest pain.  MAKE SURE YOU:  Understand these instructions.   Will watch your condition.   Will get help right away if you are not doing well or get worse.  Document Released: 01/08/2011 Document Revised: 07/15/2011 Document Reviewed: 01/08/2011 [facility] Patient Information 2012 [place], [place].

## 2011-10-22 NOTE — Progress Notes (Signed)
Pt PCA d/c per MD order. Witnessed by Rhae Hammock, RN

## 2011-10-22 NOTE — Discharge Summary (Signed)
Physician Discharge Summary  Patient ID: [name] MRN: [id number] DOB/AGE: 16-Aug-1940 71 y.o.  Admit date: 10/20/2011 Discharge date: 10/24/2011  Admission Diagnoses: 1.RUL lung nodule (suspcious for carcinoma) 2.History of COPD 3.History of hypertension 4.History of hyperlipidemia  Discharge Diagnoses:  1.NSCC of RUL 2.History of COPD 3.History of hypertension 4.History of hyperlipidemia  Procedure (s): Video bronchoscopy, right video-assisted  thoracoscopy with wedge and mini thoracotomy with wedge resection of  right upper lobe lung nodule and mediastinal lymph node dissection by Dr. Tyrone Sage on 10/21/2011.    History of Presenting Illness: This is a 71 year old Caucasian female with history of COPD who was found to have a right upper lobe nodule. She denies hemoptysis.She does have SOB with exertion, but is functional and able to do yard work and climb stairs. A review of her previous CT on visit 09/09/2011 demonstrated 7x 10 mm right upper lung nodule. Apparently, the patient was unaware of this. She had a repeat CT scan of the chest done on 09/13/2011 as well as a PET scan done 09/21/2011.CT scan of the chest showed significant interval enlargement of a macrlobulated nodule in the right upper lobe. 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 Tyrone Sage on 10/05/2011 regarding the right upper lobe nodule. An MRI of the head was obtained 10/06/11. Results showed no evidence of metastatic disease. She was then seen by Dr. Allyson Sabal from cardiology. He stress test was done and she was felt low risk. Dr. Tyrone Sage then discussed the necessitation for right vats and probable wedge resection of the right upper  lobe. Potential risks, benefits, and complications of the surgery discussed with the patient she agreed to proceed. He is admitted to [facility] on 10/21/2011 in order to undergo a video bronchoscope, right vats, right minithoracotomy, and wedge resection right upper lobe lung nodule, and  lymph node dissection by Dr. Tyrone Sage.    Brief Hospital Course:  Patient has remained afebrile her vital signs stable. She was initially not  found to have an air leak from her chest tube. As a result, her chest tube was placed to water seal.. Daily chest x-rays were obtained did some subcutaneous emphysema on the right lateral chest wall. There was a questionable trace basilar and possible apical pneumothorax. Her A-line was removed on postoperative #1. She had a small air leak on this day as well. As a result, her chest tube remained to water seal. Provided her chest x-ray remains stable and there is no air leak, her chest tube will be removed on 10/23/2011. [place] to remove removed on postoperative day #2. Her PCA will be removed following her chest tube removal. He has are then tolerating a diet passing flatus. Provided her chest x-ray remains stable and  she remains afebrile, she will be surgical stable for discharge on Sunday, 10/24/2011.  Filed Vitals:   10/22/11 1204  BP:   Pulse:   Temp: 98.4 F (36.9 C)  Resp:      Latest Vital Signs: Blood pressure 128/75, pulse 90, temperature 98.4 F (36.9 C), temperature source Oral, resp. rate 23, height 5\' 2"  (1.575 m), weight 145 lb 4.5 oz (65.9 kg), SpO2 90.00%.  Physical Exam: Cardiovascular: RRR, no murmurs, gallops, or rubs.  Pulmonary:Diminished at bases; no rales, wheezes, or rhonchi.  Abdomen: Soft, non tender, bowel sounds present.  Extremities: No lower extremity edema.  Wounds: Clean and dry. No erythema or signs of infection.   Discharge Condition:Stable  Recent laboratory studies:  Lab Results  Component Value Date   WBC 7.6  10/22/2011   HGB 12.5 10/22/2011   HCT 35.9* 10/22/2011   MCV 94.0 10/22/2011   PLT 142* 10/22/2011   Lab Results  Component Value Date   NA 133* 10/22/2011   K 3.6 10/22/2011   CL 97 10/22/2011   CO2 29 10/22/2011   CREATININE 0.61 10/22/2011   GLUCOSE 103* 10/22/2011      Diagnostic Studies: Dg Chest 2 View  10/22/2011  *RADIOLOGY REPORT*  Clinical Data: Evaluate chest tubes.  PORTABLE CHEST - 1 VIEW  Comparison: 1 day prior  Findings: Right IJ central line unchanged.  The right-sided chest tube is unchanged in position.  May be a small amount of pleural air identified lateral to the chest tube.  The right hemidiaphragm is also well visualized, a small amount of subpulmonic air cannot be excluded.  Similar moderate right-sided subcutaneous emphysema.  Normal heart size.  No pleural fluid.  Patchy areas of right greater than left lower lobe predominant atelectasis are slightly improved.  IMPRESSION:  1.  Right chest tube remaining in place.  Suspect minimal lateral and inferior pleural air. 2.  Otherwise improved patchy bilateral atelectasis.  Original Report Authenticated By: Consuello Bossier, M.D.    Discharge Orders    Future Appointments: Provider: Department: Dept Phone: Center:   10/29/2011 9:30 AM Jesus Genera Nurse [facility] [phone] [facility]   11/08/2011 2:00 PM [facility] [phone] [facility]      Discharge Medications: Medication List  As of 10/22/2011 12:28 PM   TAKE these medications         acetaminophen 650 MG CR tablet   Commonly known as: TYLENOL   Take 1,300 mg by mouth 2 (two) times daily.      albuterol 108 (90 BASE) MCG/ACT inhaler   Commonly known as: PROVENTIL HFA;VENTOLIN HFA   Inhale 2 puffs into the lungs every 6 (six) hours as needed. For wheezing      amLODipine 5 MG tablet   Commonly known as: NORVASC   Take 5 mg by mouth daily.      budesonide-formoterol 160-4.5 MCG/ACT inhaler   Commonly known as: SYMBICORT   Inhale 2 puffs into the  lungs 2 (two) times daily.      calcium-vitamin D 500-200 MG-UNIT per tablet   Commonly known as: OSCAL WITH D   Take 1 tablet by mouth 2 (two) times daily.      diphenhydrAMINE 25 MG tablet   Commonly known as: SOMINEX   Take 25 mg by mouth daily as needed. For allergies      FLUoxetine 20 MG tablet   Commonly known as: PROZAC   Take 20 mg by mouth 2 (two) times daily.      fluticasone 50 MCG/ACT nasal spray   Commonly known as: FLONASE   Place 2 sprays into the nose daily as needed. For stuffy nose      Glucosamine 500 MG Caps   Take 1 capsule by mouth 2 (two) times daily.      mulitivitamin with minerals Tabs   Take 1 tablet by mouth daily.      tiotropium 18 MCG inhalation capsule   Commonly known as: SPIRIVA  Place 1 capsule (18 mcg total) into inhaler and inhale daily.      traMADol 50 MG tablet   Commonly known as: ULTRAM   Take 1-2 tablets (50-100 mg total) by mouth every 6 (six) hours as needed.            Follow Up Appointments: Follow-up Information    Follow up with GERHARDT,EDWARD B, MD. (PA/LAT CXR to be taken on 11/08/2011 at 12:00 pm ;Appointment with physician assistant is on 11/08/2011  at 2:00 pm)    Contact information:   [address] [place] [address] [phone]       Follow up with Nurse at Dr. Dennie Maizes office. (Appointment to have chest tube suture removed on 10/29/2011 at 9:30 am)          Signed: Nhat Hearne MPA-C 10/22/2011, 12:28 PM

## 2011-10-23 ENCOUNTER — Inpatient Hospital Stay (HOSPITAL_COMMUNITY): Payer: Medicare Other

## 2011-10-23 LAB — CULTURE, RESPIRATORY W GRAM STAIN: Gram Stain: NONE SEEN

## 2011-10-23 MED ORDER — GUAIFENESIN ER 600 MG PO TB12
600.0000 mg | ORAL_TABLET | Freq: Two times a day (BID) | ORAL | Status: DC
  Administered 2011-10-23 – 2011-10-24 (×3): 600 mg via ORAL
  Filled 2011-10-23 (×4): qty 1

## 2011-10-23 NOTE — Progress Notes (Signed)
Pt premedicated with tramadol.  Chest tube d/c'd per order/per protocol.  Pt tolerated well.  PCXR ordered for post removal.    Roselie Awkward, RN

## 2011-10-23 NOTE — Progress Notes (Addendum)
3 Days Post-Op Procedure(s) (LRB): VIDEO ASSISTED THORACOSCOPY (VATS)/WEDGE RESECTION (Right) VIDEO BRONCHOSCOPY (N/A)  Subjective: Patient without complaints except cough.  Objective: Vital signs in last 24 hours: Patient Vitals for the past 24 hrs:  BP Temp Temp src Pulse Resp SpO2  10/23/11 0915 - - - 91  19  93 %  10/23/11 0800 108/69 mmHg 97.8 F (36.6 C) Oral 85  15  94 %  10/23/11 0315 123/84 mmHg 97.9 F (36.6 C) Oral 88  15  91 %  10/22/11 2335 139/94 mmHg 99 F (37.2 C) Oral 91  19  95 %  10/22/11 1925 123/82 mmHg 98.6 F (37 C) Oral 78  16  91 %  10/22/11 1204 126/90 mmHg 98.4 F (36.9 C) Oral 86  18  90 %    Current Weight  10/20/11 145 lb 4.5 oz (65.9 kg)      Intake/Output from previous day: 03/15 0701 - 03/16 0700 In: 480 [P.O.:480] Out: 2150 [Urine:2150]   Physical Exam:  Cardiovascular: RRR, no murmurs, gallops, or rubs. Pulmonary:Diminished at bases; no rales, wheezes, or rhonchi. Abdomen: Soft, non tender, bowel sounds present. Extremities: No lower extremity edema. Wounds: Clean and dry.  No erythema or signs of infection.  Lab Results: CBC:  Basename 10/22/11 0335 10/21/11 0355  WBC 7.6 9.7  HGB 12.5 12.2  HCT 35.9* 34.9*  PLT 142* 147*   BMET:   Basename 10/22/11 0335 10/21/11 0355  NA 133* 127*  K 3.6 3.7  CL 97 96  CO2 29 25  GLUCOSE 103* 118*  BUN 6 7  CREATININE 0.61 0.52  CALCIUM 9.2 8.4    PT/INR: No results found for this basename: LABPROT,INR in the last 72 hours ABG:  INR: Will add last result for INR, ABG once components are confirmed Will add last 4 CBG results once components are confirmed  Assessment/Plan: 1.CV-had a brief run of NSVT yesterday. SR since then. Monitor. 2.Pulmonary-Chest tube had scant output the last 24 hours.Chest tube still with some" tidling" and has an air leak with cough. CXR this am shows decreased subcutaneous emphsema of the right chest wall, bibasilar atelectasis, no obvious  pneumothroax, and improved aeration.Chest tube to remain to water seal for now.  3.Mucinex for cough.  ZIMMERMAN,DONIELLE MPA-C 10/23/2011    10/23/2011 9:22 AM    RN changed dressing this AM, there is NO air leak at present Will d/c CT

## 2011-10-24 ENCOUNTER — Inpatient Hospital Stay (HOSPITAL_COMMUNITY): Payer: Medicare Other

## 2011-10-24 MED ORDER — GUAIFENESIN ER 600 MG PO TB12: 600.0000 mg | ORAL_TABLET | Freq: Two times a day (BID) | ORAL | Status: DC

## 2011-10-24 NOTE — Progress Notes (Signed)
Discharge instructions given to patient and husband.  Both verbalized understanding with all questions answered.  Pt discharged home with husband.    Roselie Awkward, RN

## 2011-10-24 NOTE — Discharge Summary (Signed)
Her chest tube was removed on 10/23/2015. Her chest x-rays have remained stable. She's felt surgically stable for discharge today. The only changes to her previously dictated medications with the addition of Mucinex 600 mg by mouth 2 times daily for cough.

## 2011-10-24 NOTE — Progress Notes (Addendum)
4 Days Post-Op Procedure(s) (LRB): VIDEO ASSISTED THORACOSCOPY (VATS)/WEDGE RESECTION (Right) VIDEO BRONCHOSCOPY (N/A)  Subjective: Patient wants to go home today.  Objective: Vital signs in last 24 hours: Patient Vitals for the past 24 hrs:  BP Temp Temp src Pulse Resp SpO2  10/24/11 0300 129/83 mmHg 98.2 F (36.8 C) Oral 89  20  96 %  10/23/11 2300 134/80 mmHg 98.1 F (36.7 C) Oral 83  16  96 %  10/23/11 2002 - - - - - 94 %  10/23/11 1900 130/82 mmHg 98.5 F (36.9 C) Oral 93  25  94 %  10/23/11 1600 102/64 mmHg 98.3 F (36.8 C) Oral 96  21  92 %  10/23/11 1200 118/73 mmHg 98.2 F (36.8 C) Oral 89  19  96 %  10/23/11 0915 - - - 91  19  93 %  10/23/11 0800 108/69 mmHg 97.8 F (36.6 C) Oral 85  15  94 %    Current Weight  10/20/11 145 lb 4.5 oz (65.9 kg)      Intake/Output from previous day: 03/16 0701 - 03/17 0700 In: 240 [P.O.:240] Out: 350 [Urine:350]   Physical Exam:  Cardiovascular: RRR, no murmurs, gallops, or rubs. Pulmonary:Slightly diminished at bases; no rales, wheezes, or rhonchi. Abdomen: Soft, non tender, bowel sounds present. Extremities: No lower extremity edema. Wounds: Clean and dry.  No erythema or signs of infection.  Lab Results: CBC:  Basename 10/22/11 0335  WBC 7.6  HGB 12.5  HCT 35.9*  PLT 142*   BMET:   Basename 10/22/11 0335  NA 133*  K 3.6  CL 97  CO2 29  GLUCOSE 103*  BUN 6  CREATININE 0.61  CALCIUM 9.2    PT/INR: No results found for this basename: LABPROT,INR in the last 72 hours ABG:  INR: Will add last result for INR, ABG once components are confirmed Will add last 4 CBG results once components are confirmed  Assessment/Plan: Pulmonary-chest xray shows patient rotated to the right, stable subcutaneous emphysema right lateral chest wall.Will likely discharge home this am.  ZIMMERMAN,DONIELLE MPA-C 10/24/2011    10/24/2011 7:29 AM   Patient seen and examined. Agree with above. D/C home today

## 2011-10-25 ENCOUNTER — Encounter (HOSPITAL_COMMUNITY): Payer: Self-pay | Admitting: Cardiothoracic Surgery

## 2011-10-27 NOTE — Progress Notes (Signed)
UR Completed.  [name] [phone] 10/27/2011  

## 2011-10-29 ENCOUNTER — Ambulatory Visit (INDEPENDENT_AMBULATORY_CARE_PROVIDER_SITE_OTHER): Payer: Self-pay | Admitting: *Deleted

## 2011-10-29 DIAGNOSIS — C3410 Malignant neoplasm of upper lobe, unspecified bronchus or lung: Secondary | ICD-10-CM

## 2011-10-29 DIAGNOSIS — Z4802 Encounter for removal of sutures: Secondary | ICD-10-CM

## 2011-10-29 NOTE — Progress Notes (Unsigned)
Mrs. Moro returns for suture removal of 2 sutures from a previous ct tube site. This site as well as her right thoracotomy site is very well healed.  Diet and bowels normal.  She c/o trouble sleeping.  She feels as if she may have had a reaction to the Tramadol....numbness to one side of her tongue with sore throat.  She has been taking Aleve with sufficient relief.  She doesn't like Oxycodone because it makes her feel "crazy".  Lungs are CTA all fields.  She will rtc on 11/08/11 with cxr tor f/u.

## 2011-11-01 ENCOUNTER — Other Ambulatory Visit: Payer: Self-pay | Admitting: Cardiothoracic Surgery

## 2011-11-01 DIAGNOSIS — D381 Neoplasm of uncertain behavior of trachea, bronchus and lung: Secondary | ICD-10-CM

## 2011-11-04 ENCOUNTER — Other Ambulatory Visit: Payer: Self-pay | Admitting: Physician Assistant

## 2011-11-05 ENCOUNTER — Other Ambulatory Visit: Payer: Self-pay | Admitting: Physician Assistant

## 2011-11-08 ENCOUNTER — Telehealth: Payer: Self-pay | Admitting: Internal Medicine

## 2011-11-08 ENCOUNTER — Ambulatory Visit
Admission: RE | Admit: 2011-11-08 | Discharge: 2011-11-08 | Disposition: A | Discharge status: Home / Self Care | Payer: Medicare Other | Source: Ambulatory Visit | Attending: Cardiothoracic Surgery | Admitting: Cardiothoracic Surgery

## 2011-11-08 ENCOUNTER — Ambulatory Visit (INDEPENDENT_AMBULATORY_CARE_PROVIDER_SITE_OTHER): Payer: Self-pay | Admitting: Physician Assistant

## 2011-11-08 VITALS — BP 108/68 | HR 88 | Resp 20 | Ht 64.0 in | Wt 131.0 lb

## 2011-11-08 DIAGNOSIS — C3490 Malignant neoplasm of unspecified part of unspecified bronchus or lung: Secondary | ICD-10-CM

## 2011-11-08 DIAGNOSIS — D381 Neoplasm of uncertain behavior of trachea, bronchus and lung: Secondary | ICD-10-CM

## 2011-11-08 DIAGNOSIS — Z09 Encounter for follow-up examination after completed treatment for conditions other than malignant neoplasm: Secondary | ICD-10-CM

## 2011-11-08 MED ORDER — TRAMADOL HCL 50 MG PO TABS: 50.0000 mg | ORAL_TABLET | Freq: Four times a day (QID) | ORAL | Status: DC | PRN

## 2011-11-08 NOTE — Telephone Encounter (Signed)
pt there for f/u,appt made for f/u here post surg   aom

## 2011-11-08 NOTE — Progress Notes (Signed)
HPI:  [name] is a 71 yo female with known history of COPD.  She has been routinely followed by Dr. Garnet Koyanagi who found the patient to have a RUL nodule.  The patient had underwent PET scans which were hypermetabolic with increasing SUV from previous scans.  Based on these findings it was felt that this was most likely Lung Cancer.  The patient was then referred to Cardiothoracic surgery for possible surgical resection.  She was evaluated by Dr. Tyrone Sage on 09/30/2011 at which time Dr. Tyrone Sage reviewed the patient's films and agreed these findings were most likely cancer.  The risks and benefits of possible lobectomy were discussed with the patient and she wished to proceed with surgery.  On 10/20/2011 the patient underwent R VATS, with Thoracotomy and RUL wedge resection.  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  She presents today for follow-up.  Overall the patient is doing very well.  She denies pain and shortness of breath.  She does complain of some fatigue and was questioning if that is normal for this type of procedure.  I re-assured the patient that it is possible with the surgery and can be related to her lung cancer.   She has not been evaluated by Hematology Oncology.   Current Outpatient Prescriptions  Medication Sig Dispense Refill  . acetaminophen (TYLENOL) 650 MG CR tablet Take 1,300 mg by mouth 2 (two) times daily.        Marland Kitchen albuterol (PROVENTIL HFA;VENTOLIN HFA) 108 (90 BASE) MCG/ACT inhaler Inhale 2 puffs into the lungs every 6 (six) hours as needed. For wheezing      . amLODipine (NORVASC) 5 MG tablet Take 5 mg by mouth daily.        . budesonide-formoterol (SYMBICORT) 160-4.5 MCG/ACT inhaler Inhale 2 puffs into the lungs 2 (two) times daily.  1 Inhaler  12  . calcium-vitamin D (OSCAL WITH D) 500-200 MG-UNIT per tablet Take 1 tablet by mouth 2 (two) times daily.      . diphenhydrAMINE (SOMINEX) 25 MG  tablet Take 25 mg by mouth daily as needed. For allergies      . FLUoxetine (PROZAC) 20 MG tablet Take 20 mg by mouth 2 (two) times daily.       . fluticasone (FLONASE) 50 MCG/ACT nasal spray Place 2 sprays into the nose daily as needed. For stuffy nose      . Glucosamine 500 MG CAPS Take 1 capsule by mouth 2 (two) times daily.       . Multiple Vitamin (MULITIVITAMIN WITH MINERALS) TABS Take 1 tablet by mouth daily.      Marland Kitchen tiotropium (SPIRIVA HANDIHALER) 18 MCG inhalation capsule Place 1 capsule (18 mcg total) into inhaler and inhale daily.  30 capsule  12  . traMADol (ULTRAM) 50 MG tablet Take 1 tablet (50 mg total) by mouth every 6 (six) hours as needed for pain.  40 tablet  1  . DISCONTD: Calcium Carbonate-Vitamin D (CALCIUM + D PO) Take by mouth daily. 500+400       . DISCONTD: diphenhydrAMINE (BENADRYL) 50 MG tablet Take 50 mg by mouth as needed.          Physical Exam:  Gen:  No apparent distress, patient appears healthy Lungs:  CTA on left, diminished on right Heart: RRR Skin: incision well healed Neuro: grossly intact  Diagnostic Tests:  CXR: resolution of bilateral pleural effusions, no pneumothorax appreciated  Impression:  Patient  with squamous cell carcinoma of right lung, S/P R VATS, Thoracotomy with RUL Wedge Resection doing well   Plan:  1. Refer to Dr. Arbutus Ped with Heme/Onc to discuss further medical management 2. Refill Ultram 50mg  every 6 hours as needed for pain , disp 40 tablets 3. RTC in 4 weeks with CXR

## 2011-11-10 ENCOUNTER — Telehealth: Payer: Self-pay | Admitting: *Deleted

## 2011-11-10 NOTE — Telephone Encounter (Signed)
Spoke with pt regarding appt for mtoc 11/11/11.  She verbalized understanding of time and place

## 2011-11-11 ENCOUNTER — Ambulatory Visit (HOSPITAL_BASED_OUTPATIENT_CLINIC_OR_DEPARTMENT_OTHER): Payer: Medicare Other | Admitting: Internal Medicine

## 2011-11-11 ENCOUNTER — Encounter: Payer: Self-pay | Admitting: Internal Medicine

## 2011-11-11 ENCOUNTER — Ambulatory Visit: Payer: Medicare Other | Admitting: Internal Medicine

## 2011-11-11 VITALS — BP 127/85 | HR 92 | Temp 97.6°F | Resp 18 | Ht 62.0 in | Wt 130.0 lb

## 2011-11-11 DIAGNOSIS — C3410 Malignant neoplasm of upper lobe, unspecified bronchus or lung: Secondary | ICD-10-CM

## 2011-11-11 DIAGNOSIS — C3490 Malignant neoplasm of unspecified part of unspecified bronchus or lung: Secondary | ICD-10-CM

## 2011-11-13 NOTE — Progress Notes (Signed)
[facility] [phone]   [phone]  CONSULT NOTE  REASON FOR CONSULTATION:  71 years old white female diagnosed with lung cancer.  HPI [name] is a 71 y.o. female with past medical history significant for COPD, dyslipidemia, gout, hypertension, anemia and osteoporosis. She also has a long history of smoking. The patient was seen recently for evaluation of COPD by Dr. Marchelle Gearing and repeat CT scan of the chest on 09/13/2011 and it showed Significant interval enlargement of a macrolobulated nodule in the right upper lobe when compared to remote prior study. This lesion currently measures 2.3 x 1.8 cm. At this time, no definite mediastinal or hilar adenopathy is appreciated, and there are no other definite nodules identified. This is suspicious for a primary bronchogenic neoplasm, and at this point appears to represent T1b, N0, Mx disease. Further evaluation with PET CT or CT-guided percutaneous needle biopsy is recommended. A PET scan on 09/21/2011 showed pulmonary nodule within the right upper lobe exhibits malignant range FDG uptake and is worrisome for primary lung neoplasm. MRI of the brain on 10/06/2011 was negative for metastatic disease to the brain. On 10/20/2011 the patient underwent video bronchoscopy, right video-assisted thoracoscopy with wedge and mini thoracotomy with wedge resection of right upper lobe lung nodule. 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 Dr. Tyrone Sage kindly referred the patient to me today for evaluation and recommendation regarding adjuvant therapy. The patient was seen at the multidisciplinary thoracic oncology clinic [facility]). She is feeling fine today and recovering from her surgery fairly well. No new complaints she has soreness in the right side of the chest at the surgical scar. She takes tramadol  twice a day. No other significant complaints.   the patient is married and was accompanied by her husband [name]. She is currently retired and used to work at the lab at [employer]. She has a history of smoking 2 packs per day for around 50 years, quit recently.   @SFHPI @  Past Medical History  Diagnosis Date  . COPD (chronic obstructive pulmonary disease)   . Depression   . Anxiety   . Gout     last time 6-56yrs ago   . Hepatic cyst   . Migraine   . OCD (obsessive compulsive disorder)   . Osteoporosis   . Tinea corporis   . Acute bronchitis   . Allergic rhinitis   . Chronic diarrhea of unknown origin   . Gout   . Macrocytosis   . Skin rash   . Flushing   . Need for prophylactic vaccination and inoculation against influenza   . Anemia   . Benign paroxysmal positional vertigo   . PONV (postoperative nausea and vomiting)     hard to wake  . Hypertension     takes Amlodipine nightly  . Hyperlipidemia     taking Flax Seed Oil and Fish Oil  . Shortness of breath     with exertion  . Emphysema   . Pneumonia     x 2 ;last time back in 1999  . Hx of migraines 90's    after menopause HA stopped  . Arthritis     all over  . Joint pain   . Joint swelling   . Osteopenia     takes Calcium and Vit D bid  . Back pain   . Dry skin   . Liver  cyst   . IBS (irritable bowel syndrome)   . Diarrhea   . Blood transfusion     at age 48  . Bilateral cataracts   . Hot flashes     takes Prozac daily  . Lung nodule     right    Past Surgical History  Procedure Date  . Inguinal hernia repair 2000  . Neuroplasty decompression median nerve at carpal tunnel   . Rotator cuff repair 2001    right  . Tonsillectomy   . Tonsillectomy as a child    and adenoids  . Dilation and curettage of uterus     at age 60  . Cyst removed >54yrs ago    from left leg  . Carpal tunnel release 80's    right   . Total abdominal hysterectomy 2003  . Total hip arthroplasty 2011  . Colonoscopy     . Video bronchoscopy 10/20/2011    Procedure: VIDEO BRONCHOSCOPY;  Surgeon: [name], MD;  Location[facility];  Service: Thoracic;  Laterality: N/A;    Family History  Problem Relation Age of Onset  . Alcohol abuse    . Depression    . Hearing loss    . Heart disease    . Hypertension    . Anesthesia problems Neg Hx   . Hypotension Neg Hx   . Malignant hyperthermia Neg Hx   . Pseudochol deficiency Neg Hx     Social History History  Substance Use Topics  . Smoking status: Former Smoker -- 0.5 packs/day for 50 years    Types: Cigarettes    Quit date: 09/23/2011  . Smokeless tobacco: Never Used   Comment: quit smoking about a month ago  . Alcohol Use: 0.0 oz/week     2 beers every night    Allergies  Allergen Reactions  . Clindamycin/Lincomycin Nausea And Vomiting  . Penicillins Other (See Comments)    Unknown   . Sulfa Antibiotics Rash    Current Outpatient Prescriptions  Medication Sig Dispense Refill  . acetaminophen (TYLENOL) 650 MG CR tablet Take 1,300 mg by mouth 2 (two) times daily.        Marland Kitchen albuterol (PROVENTIL HFA;VENTOLIN HFA) 108 (90 BASE) MCG/ACT inhaler Inhale 2 puffs into the lungs every 6 (six) hours as needed. For wheezing      . amLODipine (NORVASC) 5 MG tablet Take 5 mg by mouth daily.        . budesonide-formoterol (SYMBICORT) 160-4.5 MCG/ACT inhaler Inhale 2 puffs into the lungs 2 (two) times daily.  1 Inhaler  12  . calcium-vitamin D (OSCAL WITH D) 500-200 MG-UNIT per tablet Take 1 tablet by mouth 2 (two) times daily.      . cetirizine (ZYRTEC) 10 MG tablet Take 10 mg by mouth daily.      Marland Kitchen FLUoxetine (PROZAC) 20 MG tablet Take 20 mg by mouth 2 (two) times daily.       . fluticasone (FLONASE) 50 MCG/ACT nasal spray Place 2 sprays into the nose daily as needed. For stuffy nose      . Glucosamine 500 MG CAPS Take 1 capsule by mouth 2 (two) times daily.       . Multiple Vitamin (MULITIVITAMIN WITH MINERALS) TABS Take 1 tablet by mouth daily.       Marland Kitchen tiotropium (SPIRIVA HANDIHALER) 18 MCG inhalation capsule Place 1 capsule (18 mcg total) into inhaler and inhale daily.  30 capsule  12  . traMADol (ULTRAM) 50 MG tablet Take  1 tablet (50 mg total) by mouth every 6 (six) hours as needed for pain.  40 tablet  1  . DISCONTD: Calcium Carbonate-Vitamin D (CALCIUM + D PO) Take by mouth daily. 500+400       . DISCONTD: diphenhydrAMINE (BENADRYL) 50 MG tablet Take 50 mg by mouth as needed.          Review of Systems  A comprehensive review of systems was negative except for: Respiratory: positive for pleurisy/chest pain  Physical Exam  WUJ:WJXBJ, healthy, no distress, well nourished and well developed SKIN: skin color, texture, turgor are normal HEAD: Normocephalic, No masses, lesions, tenderness or abnormalities EYES: normal EARS: External ears normal OROPHARYNX:no exudate and no erythema  NECK: supple, no adenopathy LYMPH:  no palpable lymphadenopathy, no hepatosplenomegaly BREAST:not examined LUNGS: clear to auscultation  HEART: regular rate & rhythm, no murmurs and no gallops ABDOMEN:abdomen soft, non-tender, normal bowel sounds and no masses or organomegaly EXTREMITIES:no joint deformities, effusion, or inflammation, no edema, no skin discoloration, no clubbing, no cyanosis  NEURO: alert & oriented x 3 with fluent speech, no focal motor/sensory deficits  PERFORMANCE STATUS: ECOG 1  Studies/Results: Dg Chest 2 View  11/08/2011  *RADIOLOGY REPORT*  Clinical Data: 71 year old female status post right lung surgery on 10/20/2011.  CHEST - 2 VIEW  Comparison: 10/24/2011 and earlier.  Findings: Subcutaneous gas along the right chest wall has resolved. Small bilateral pleural effusions have decreased or resolved. Postoperative changes in the right peripheral lung radiating to the hilum with mildly decreased opacity.  No pneumothorax or pulmonary edema.  No new pulmonary opacity.  Cardiac size and mediastinal contours are within normal limits.   Visualized tracheal air column is within normal limits.  Stable visualized osseous structures.  IMPRESSION: Decreased or resolved small pleural effusions.  Postoperative changes on the right.  No new cardiopulmonary abnormality.  Original Report Authenticated By: Harley Hallmark, M.D.   Dg Chest 2 View  10/24/2011  *RADIOLOGY REPORT*  Clinical Data: Chest pain, recent right chest tube removal  CHEST - 2 VIEW  Comparison: 10/23/2011  Findings: Stable postoperative changes in the peripheral right upper lobe where sutures are visible.  Trace right pleural effusion noted.  No significant pneumothorax.  Stable heart size and vascularity.  Left lung remains clear.  Right chest subcutaneous air evident.  IMPRESSION: Stable postoperative appearance of the right hemithorax.  No significant or enlarging pneumothorax.  Trace right pleural effusion  Original Report Authenticated By: Judie Petit. Ruel Favors, M.D.   Dg Chest 2 View  10/18/2011  *RADIOLOGY REPORT*  Clinical Data: Preop for VATS, smoking history  CHEST - 2 VIEW  Comparison: CT chest of 09/13/2011  Findings: The oval nodule within the right upper lobe is again well visualized and most consistent with primary lung carcinoma.  The lungs are hyperaerated consistent with COPD.  Mediastinal contours are stable.  The heart is mildly enlarged.  There are degenerative changes in the mid to lower thoracic spine.  IMPRESSION:  Oval nodule in the inferior right upper lobe is again noted worrisome for primary lung carcinoma.  COPD.  Original Report Authenticated By: Juline Patch, M.D.   Dg Chest Port 1 View  10/23/2011  *RADIOLOGY REPORT*  Clinical Data: Chest tube removal  PORTABLE CHEST - 1 VIEW  Comparison: 10/23/2011  Findings: Interval right chest tube removal.  Stable postop findings in the right upper lobe peripherally.  No enlarging pneumothorax or effusion evident.  Stable heart size and vascularity.  Increased basilar atelectasis.  Trachea is midline.  IMPRESSION:  Right chest tube removal.  No significant pneumothorax.  Basilar atelectasis  Original Report Authenticated By: Judie Petit. Ruel Favors, M.D.   Dg Chest Port 1 View  10/23/2011  *RADIOLOGY REPORT*  Clinical Data: Right that is procedure.  Right pneumothorax.  PORTABLE CHEST - 1 VIEW  Comparison: 10/22/2011  Findings: The small focal pneumothorax laterally in the right midzone has almost completely resolved.  Small amount of subcutaneous emphysema has diminished.  Chest tube appears in good position.  Central line has been removed.  Heart size and vascularity are normal.  Minimal atelectasis of the left base has resolved.  IMPRESSION: Improving aeration bilaterally.  Very tiny remnant of the right lateral pneumothorax.  Original Report Authenticated By: Gwynn Burly, M.D.   Dg Chest Port 1 View  10/22/2011  *RADIOLOGY REPORT*  Clinical Data: Evaluate chest tubes.  PORTABLE CHEST - 1 VIEW  Comparison: 1 day prior  Findings: Right IJ central line unchanged.  The right-sided chest tube is unchanged in position.  May be a small amount of pleural air identified lateral to the chest tube.  The right hemidiaphragm is also well visualized, a small amount of subpulmonic air cannot be excluded.  Similar moderate right-sided subcutaneous emphysema.  Normal heart size.  No pleural fluid.  Patchy areas of right greater than left lower lobe predominant atelectasis are slightly improved.  IMPRESSION:  1.  Right chest tube remaining in place.  Suspect minimal lateral and inferior pleural air. 2.  Otherwise improved patchy bilateral atelectasis.  Original Report Authenticated By: Consuello Bossier, M.D.   Dg Chest Port 1 View  10/21/2011  *RADIOLOGY REPORT*  Clinical Data: Postop; chest tube placement  PORTABLE CHEST - 1 VIEW  Comparison: October 20, 2011  Findings: The right chest tube is stable at the apex.  The right IJ central line tip is stable at the cavoatrial junction.  There is no discernible pneumothorax.  Right upper lung  atelectasis is unchanged.  The left lung is clear.  The cardiac silhouette, mediastinum, pulmonary vasculature are within normal limits.  IMPRESSION: Stable chest x-ray with no evidence of pneumothorax.  Original Report Authenticated By: Brandon Melnick, M.D.   Dg Chest Portable 1 View  10/20/2011  *RADIOLOGY REPORT*  Clinical Data: Postoperative studies status post right upper lobe wedge resection.Atherosclerotic calcifications in the arch of the aorta.  PORTABLE CHEST - 1 VIEW  Comparison: Chest x-ray 10/18/2011.  Findings: A right-sided chest tube is in place with tip at the apex and side port in the mid - upper right hemithorax.  No definite pneumothorax identified.  Right internal jugular central venous catheter with tip terminating in the mid superior vena cava.  Status post wedge resection of the right upper lobe pulmonary nodule.  There is a focal area of architectural distortion in the right upper lobe pulmonary parenchyma, likely represents resolving postoperative hemorrhage/edema.  Lungs are otherwise clear.  No definite pleural effusions.  Pulmonary vasculature is within normal limits.  Heart size is borderline enlarged. The patient is rotated to the right on today's exam, resulting in distortion of the mediastinal contours and reduced diagnostic sensitivity and specificity for mediastinal pathology.  IMPRESSION: 1.  Expected postoperative changes and support apparatus related to right upper lobe wedge resection, as above, without acute complicating features.  Original Report Authenticated By: Florencia Reasons, M.D.     ASSESSMENT: This is a very pleasant 71 years old white female recently diagnosed with a stage IA (T1b,  N0, M0) non-small cell lung cancer consistent with invasive squamous cell carcinoma. She is status post wedge resection of the right upper lobe.   PLAN: I have a lengthy discussion with the patient and her husband today about her disease stage, prognosis and treatment options.  I indicated to the patient that there is no survival benefit for adjuvant chemotherapy for a stage IA non-small cell lung cancer and the current standard of care is observation. I would see the patient back for followup visit in 6 months with repeat CT scan of the chest. She was advised to call me immediately if she has any concerning symptoms in the interval. I gave the patient and her husband the time to ask questions and I answered them completely to their satisfactions.  All questions were answered. The patient knows to call the clinic with any problems, questions or concerns. We can certainly see the patient much sooner if necessary.  Thank you so much for allowing me to participate in the care of [name]. I will continue to follow up the patient with you and assist in her care.  I spent 25 minutes counseling the patient face to face. The total time spent in the appointment was 50 minutes.   Avanna Sowder K. 11/13/2011, 4:08 PM

## 2011-11-15 ENCOUNTER — Telehealth: Payer: Self-pay | Admitting: Internal Medicine

## 2011-11-15 NOTE — Telephone Encounter (Signed)
[name] with appts  for october lab ct and md visit,advised pt to call if this sch will not work for him  aom

## 2011-11-16 LAB — FUNGUS CULTURE W SMEAR: Fungal Smear: NONE SEEN

## 2011-11-24 ENCOUNTER — Encounter: Payer: Self-pay | Admitting: *Deleted

## 2011-12-04 LAB — AFB CULTURE WITH SMEAR (NOT AT ARMC): Acid Fast Smear: NONE SEEN

## 2011-12-07 ENCOUNTER — Other Ambulatory Visit: Payer: Self-pay | Admitting: Cardiothoracic Surgery

## 2011-12-07 DIAGNOSIS — C3490 Malignant neoplasm of unspecified part of unspecified bronchus or lung: Secondary | ICD-10-CM

## 2011-12-09 ENCOUNTER — Ambulatory Visit
Admission: RE | Admit: 2011-12-09 | Discharge: 2011-12-09 | Disposition: A | Discharge status: Home / Self Care | Payer: Medicare Other | Source: Ambulatory Visit | Attending: Cardiothoracic Surgery | Admitting: Cardiothoracic Surgery

## 2011-12-09 ENCOUNTER — Ambulatory Visit (INDEPENDENT_AMBULATORY_CARE_PROVIDER_SITE_OTHER): Payer: Self-pay | Admitting: Cardiothoracic Surgery

## 2011-12-09 ENCOUNTER — Encounter: Payer: Self-pay | Admitting: Cardiothoracic Surgery

## 2011-12-09 VITALS — BP 125/84 | HR 72 | Resp 16 | Ht 62.0 in | Wt 130.0 lb

## 2011-12-09 DIAGNOSIS — C3490 Malignant neoplasm of unspecified part of unspecified bronchus or lung: Secondary | ICD-10-CM

## 2011-12-09 DIAGNOSIS — C3410 Malignant neoplasm of upper lobe, unspecified bronchus or lung: Secondary | ICD-10-CM

## 2011-12-09 DIAGNOSIS — Z09 Encounter for follow-up examination after completed treatment for conditions other than malignant neoplasm: Secondary | ICD-10-CM

## 2011-12-09 NOTE — Assessment & Plan Note (Signed)
2.8 well differentiated Squamous Cell CA of Lung pT1b pN0 resected 10/20/2011

## 2011-12-09 NOTE — Patient Instructions (Signed)
Doing well post op Chest xray looks good Return in 6 months after follow up ct of chest

## 2011-12-09 NOTE — Progress Notes (Signed)
[address]            [place] [address]          [phone]       [name] [facility] Medical Record #[id number] Date of Birth: 1941-07-15  Angela Shan, MD Henri Medal, MD, MD  Chief Complaint:   PostOp Follow Up Visit PROCEDURE PERFORMED: Video bronchoscopy, right video-assisted  thoracoscopy with wedge and mini thoracotomy with wedge resection of  right upper lobe lung nodule and mediastinal lymph node dissection.    History of Present Illness:     Patient doing well postoperatively, she has not noticed much change in her pulmonary reserve. Some minor cough early in the morning. She is returned to near normal activities          History  Smoking status  . Former Smoker -- 0.5 packs/day for 50 years  . Types: Cigarettes  . Quit date: 09/23/2011  Smokeless tobacco  . Never Used  Comment: quit smoking about a month ago       Allergies  Allergen Reactions  . Clindamycin/Lincomycin Nausea And Vomiting  . Penicillins Other (See Comments)    Unknown   . Sulfa Antibiotics Rash    Current Outpatient Prescriptions  Medication Sig Dispense Refill  . diphenhydrAMINE (SOMINEX) 25 MG tablet Take 25 mg by mouth as needed.      Marland Kitchen acetaminophen (TYLENOL) 650 MG CR tablet Take 1,300 mg by mouth 2 (two) times daily.        Marland Kitchen albuterol (PROVENTIL HFA;VENTOLIN HFA) 108 (90 BASE) MCG/ACT inhaler Inhale 2 puffs into the lungs every 6 (six) hours as needed. For wheezing      . amLODipine (NORVASC) 5 MG tablet Take 5 mg by mouth daily.        . budesonide-formoterol (SYMBICORT) 160-4.5 MCG/ACT inhaler Inhale 2 puffs into the lungs 2 (two) times daily.  1 Inhaler  12  . calcium-vitamin D (OSCAL WITH D) 500-200 MG-UNIT per tablet Take 1 tablet by mouth 2 (two) times daily.      . cetirizine (ZYRTEC) 10 MG tablet Take 10 mg by mouth daily.      Marland Kitchen FLUoxetine (PROZAC) 20 MG tablet Take 20 mg by mouth 2 (two) times daily.        . fluticasone (FLONASE) 50 MCG/ACT nasal spray Place 2 sprays into the nose daily as needed. For stuffy nose      . Glucosamine 500 MG CAPS Take 1 capsule by mouth 2 (two) times daily.       . Multiple Vitamin (MULITIVITAMIN WITH MINERALS) TABS Take 1 tablet by mouth daily.      Marland Kitchen tiotropium (SPIRIVA HANDIHALER) 18 MCG inhalation capsule Place 1 capsule (18 mcg total) into inhaler and inhale daily.  30 capsule  12  . traMADol (ULTRAM) 50 MG tablet Take 1 tablet (50 mg total) by mouth every 6 (six) hours as needed for pain.  40 tablet  1  . DISCONTD: Calcium Carbonate-Vitamin D (CALCIUM + D PO) Take by mouth daily. 500+400       . DISCONTD: diphenhydrAMINE (BENADRYL) 50 MG tablet Take 50 mg by mouth as needed.             Physical Exam: BP 125/84  Pulse 72  Resp 16  Ht 5\' 2"  (1.575 m)  Wt 130 lb (58.968 kg)  BMI 23.78 kg/m2  SpO2 92%  General appearance: alert and cooperative Heart: regular rate and rhythm, S1, S2 normal, no murmur, click, rub or gallop and normal apical impulse Lungs: clear to auscultation bilaterally and normal percussion bilaterally Wound: The chest tube site and small access incision on the right are well-healed  Diagnostic Studies & Laboratory data:         Recent Radiology Findings: Dg Chest 2 View  12/09/2011  *RADIOLOGY REPORT*  Clinical Data: Right lung surgery in March 2013, follow-up  CHEST - 2 VIEW  Comparison: Chest x-ray of 11/08/2011  Findings: Postoperative opacity in the periphery of the right upper lung field is stable.  No active infiltrate or effusion is seen. The heart is stable in size.  No bony abnormality is noted.  IMPRESSION: Stable postoperative change on the right.  Original Report Authenticated By: Juline Patch, M.D.      Recent Labs: Lab Results  Component Value Date   WBC 7.6 10/22/2011   HGB 12.5 10/22/2011   HCT 35.9* 10/22/2011   PLT 142* 10/22/2011   GLUCOSE 103* 10/22/2011   ALT 14 10/22/2011   AST 20 10/22/2011   NA 133*  10/22/2011   K 3.6 10/22/2011   CL 97 10/22/2011   CREATININE 0.61 10/22/2011   BUN 6 10/22/2011   CO2 29 10/22/2011   INR 0.98 10/18/2011      Assessment / Plan:      Patient returns today after resection of a 2.8 cm well differentiated squamous cell carcinoma of the right lung pT1b, pN0  Stage 1 She's made good progress postoperatively She will return in 6 months after a followup CT scan is performed part he ordered by Dr. Arbutus Ped.      [name] MD 12/09/2011 1:39 PM

## 2011-12-27 ENCOUNTER — Encounter: Payer: Self-pay | Admitting: Internal Medicine

## 2011-12-27 ENCOUNTER — Ambulatory Visit (INDEPENDENT_AMBULATORY_CARE_PROVIDER_SITE_OTHER): Payer: Medicare Other | Admitting: Internal Medicine

## 2011-12-27 VITALS — BP 120/82 | HR 71 | Temp 98.0°F | Ht 62.5 in | Wt 129.6 lb

## 2011-12-27 DIAGNOSIS — G8918 Other acute postprocedural pain: Secondary | ICD-10-CM

## 2011-12-27 DIAGNOSIS — G8912 Acute post-thoracotomy pain: Secondary | ICD-10-CM

## 2011-12-27 DIAGNOSIS — C3490 Malignant neoplasm of unspecified part of unspecified bronchus or lung: Secondary | ICD-10-CM

## 2011-12-27 DIAGNOSIS — R0602 Shortness of breath: Secondary | ICD-10-CM

## 2011-12-27 DIAGNOSIS — J449 Chronic obstructive pulmonary disease, unspecified: Secondary | ICD-10-CM

## 2011-12-27 DIAGNOSIS — F17200 Nicotine dependence, unspecified, uncomplicated: Secondary | ICD-10-CM

## 2011-12-27 DIAGNOSIS — IMO0001 Reserved for inherently not codable concepts without codable children: Secondary | ICD-10-CM

## 2011-12-27 NOTE — Progress Notes (Signed)
Subjective:    Patient ID: [name], female    DOB: 1940-10-29, 71 y.o.   MRN: [id number]  HPI IOV 09/09/11  PMD is Dr Toula Moos. Known spring allergies, IBS, OA - knees and hip and does regular water exercises. Drinks 2 coors lite each night (CAGE -negative).  Reports that she has been smoking Cigarettes.  She has a 25 pack-year smoking history. She does not have any smokeless tobacco history on file. Says that between 1999 - 2001 and 2004-2009 traveled several countries with husband - [place], [place], [place], [place], [place].  But now back in [place] since 2009. At that time  prior PMD Dr Ursula Beath told her that she has COPD. Now PMD is Dr Ludwig Clarks since Jan 2012 and a month ago advised her to come to Pulmonary following an episode of bronchitis.   She herself feels well other than mild dyspnea with undue exertion that is relieved by rest. Walks god, yard work and cleaning home does not make her dyspneic. Walking uphill makes her dyspneic. Denies associated cough, weight loss, hemoptysis  Or CAD. However, in the office walking - she desaturated  Of note, there is SPN on imaging below but she denies knowledge of this   OV 09/21/11 [place] to discuss results  Of PFT and and CT chest. No new complaints  Nm Pet Image Initial (pi) Skull Base To Thigh  09/21/2011  *RADIOLOGY REPORT*  Clinical Data:  Initial treatment strategy for right lung nodule.  NUCLEAR MEDICINE PET CT INITIAL (PI) SKULL BASE TO THIGH  Technique:  17.3 mCi F-18 FDG was injected intravenously via the right antecubital fossa.  Full-ring PET imaging was performed from the skull base through the mid-thighs 60   minutes after injection.  CT data was obtained and used for attenuation correction and anatomic localization only.  (This was not acquired as a diagnostic CT examination.)  Fasting Blood Glucose:  103  Patient Weight:  130 pounds.  Comparison:  None.  Findings: No enlarged or hypermetabolic supraclavicular  lymph nodes.  There are no hypermetabolic axillary lymph nodes.  No enlarged mediastinal or hilar lymph nodes.  There are calcifications within the LAD, RCA, and left circumflex coronary arteries.  Lungs are emphysematous.  The pulmonary nodule within the right upper lobe measures 2 x 1.6 x 1.8 cm.  There is intense FDG uptake associated with this nodule with an SUV max equal to 12.6.  There are no additional hypermetabolic pulmonary parenchymal nodules or masses identified.  There are multiple stones within the lumen of the gallbladder.  No secondary signs of acute cholecystitis.  There is no biliary dilatation.  Changes of polycystic liver disease noted.  The pancreas appears within normal limits.  The spleen appears within normal limits.  No enlarged or hypermetabolic upper abdominal lymph nodes.  There is no pelvic or inguinal adenopathy.  IMPRESSION:  1.  Pulmonary nodule within the right upper lobe exhibits malignant range FDG uptake and is worrisome for primary lung neoplasm. 2.  There are no specific features to suggest metastatic disease. If this is biopsied and is a non small cell lung cancer then this would be considered a stagedT1aN0M0 3.  Gallstones.  Original Report Authenticated By: [name], M.D.   PFTs  fev1 1.1L/60% , DLCO 9.6/56%, TLC 5.6/128% (not on Rx),      #COPD  - Please start spiriva 1 puff daily - take sample, script and show technique  - Please start symbicort 160/4.5 2 puff  twice daily - take sample, script and show technique  - nurse will ensure flu shot and pneumonia shot in place  - will check alpha 1 next visit  #Lung nodule  - this looks like early stage lung cancer in right lung  - please see Dr Dorris Fetch, or Dr Tyrone Sage or Dr Morton Peters for surgical option  #Smoking  - advised to quit smoking to mimize surgical risk  #Followup  - 3 weeks with spirometry and walk test in office  - can continue with arthritis in classes in water but be gentle  - we can  decide on walking or cycle exercises at rehab at followup    OV 12/27/2011 Followuo  - STage 1 lung cancer  - COPD  - smoking  Today Spirometry - fev1: 1.28L/60%, ratio 49 - post lobectomy. Dong well. ECOG 0. ABle to do ADLs.  Does not feel a difference. Did not tolerate spiriva. Doing symbicort only. REfused rehab. Doing water aerobics. Walk test today 185 feet x 3 laps: did not desaturate. Quit smoking but still has cravings but feels she is strong willed enough.   She has a new problem: Has some post lobectomy RT infra-axillay area chest soreness; taking tylenol prn. It is mild, constant, non-radiating, improving significantly and no aggravating factors   Past, Family, Social reviewed: s/p lobectomy. Husband had retinal detachment    Current outpatient prescriptions:acetaminophen (TYLENOL) 650 MG CR tablet, Take 1,300 mg by mouth 2 (two) times daily.  , Disp: , Rfl: ;  albuterol (PROVENTIL HFA;VENTOLIN HFA) 108 (90 BASE) MCG/ACT inhaler, Inhale 2 puffs into the lungs every 6 (six) hours as needed. For wheezing, Disp: , Rfl: ;  amLODipine (NORVASC) 5 MG tablet, Take 5 mg by mouth daily.  , Disp: , Rfl:  budesonide-formoterol (SYMBICORT) 160-4.5 MCG/ACT inhaler, Inhale 2 puffs into the lungs 2 (two) times daily., Disp: 1 Inhaler, Rfl: 12;  calcium-vitamin D (OSCAL WITH D) 500-200 MG-UNIT per tablet, Take 1 tablet by mouth 2 (two) times daily., Disp: , Rfl: ;  diphenhydrAMINE (SOMINEX) 25 MG tablet, Take 25 mg by mouth as needed., Disp: , Rfl: ;  FLUoxetine (PROZAC) 20 MG tablet, Take 20 mg by mouth 2 (two) times daily. , Disp: , Rfl:  fluticasone (FLONASE) 50 MCG/ACT nasal spray, Place 2 sprays into the nose daily as needed. For stuffy nose, Disp: , Rfl: ;  Glucosamine 500 MG CAPS, Take 1 capsule by mouth 2 (two) times daily. , Disp: , Rfl: ;  Multiple Vitamin (MULITIVITAMIN WITH MINERALS) TABS, Take 1 tablet by mouth daily., Disp: , Rfl: ;  DISCONTD: Calcium Carbonate-Vitamin D (CALCIUM + D  PO), Take by mouth daily. 500+400 , Disp: , Rfl:  DISCONTD: diphenhydrAMINE (BENADRYL) 50 MG tablet, Take 50 mg by mouth as needed.  , Disp: , Rfl:    Review of Systems  Constitutional: Negative for fever and unexpected weight change.  HENT: Negative for ear pain, nosebleeds, congestion, sore throat, rhinorrhea, sneezing, trouble swallowing, dental problem, postnasal drip and sinus pressure.   Eyes: Negative for redness and itching.  Respiratory: Negative for cough, chest tightness, shortness of breath and wheezing.   Cardiovascular: Negative for palpitations and leg swelling.  Gastrointestinal: Negative for nausea and vomiting.  Genitourinary: Positive for dysuria.  Musculoskeletal: Negative for joint swelling.  Skin: Negative for rash.  Neurological: Negative for headaches.  Hematological: Does not bruise/bleed easily.  Psychiatric/Behavioral: Negative for dysphoric mood. The patient is not nervous/anxious.        Objective:  Physical Exam Vitals reviewed. Constitutional: She is oriented to person, place, and time. She appears well-developed and well-nourished. No distress.  HENT:  Head: Normocephalic and atraumatic.  Right Ear: External ear normal.  Left Ear: External ear normal.  Mouth/Throat: Oropharynx is clear and moist. No oropharyngeal exudate.  Eyes: Conjunctivae and EOM are normal. Pupils are equal, round, and reactive to light. Right eye exhibits no discharge. Left eye exhibits no discharge. No scleral icterus.  Neck: Normal range of motion. Neck supple. No JVD present. No tracheal deviation present. No thyromegaly present.  Cardiovascular: Normal rate, regular rhythm, normal heart sounds and intact distal pulses.  Exam reveals no gallop and no friction rub.   No murmur heard. Pulmonary/Chest: Effort normal and breath sounds normal. No respiratory distress. She has no wheezes. She has no rales. She exhibits no tenderness.  - Rt chest droop +  - RT chest scar  + Abdominal: Soft. Bowel sounds are normal. She exhibits no distension and no mass. There is no tenderness. There is no rebound and no guarding.  Musculoskeletal: Normal range of motion. She exhibits no edema and no tenderness.  Lymphadenopathy:    She has no cervical adenopathy.  Neurological: She is alert and oriented to person, place, and time. She has normal reflexes. No cranial nerve deficit. She exhibits normal muscle tone. Coordination normal.  Skin: Skin is warm and dry. No rash noted. She is not diaphoretic. No erythema. No pallor.  Psychiatric: She has a normal mood and affect. Her behavior is normal. Judgment and thought content normal.            Assessment & Plan:

## 2011-12-27 NOTE — Patient Instructions (Addendum)
#  COPD - ok not to use spiriva due to intolerance  - Please continue symbicort 160/4.5 2 puff twice daily - nurse will ensure refills -  will check alpha 1 at a future visit  #Lung cancer  - I will check with DR Arbutus Ped if he is planning chemo and if not if cancer surveillance can be done at my office  - for now keep appt with Dr Arbutus Ped  -  #Smoking  -  Congratulations on quitting smoking  #Chest wall pain  - this is post surgical  - glad is better   - continue tylenol as needed  #Followup  -6 months with CAT score at followup  - hae flu shot in fall

## 2012-01-03 NOTE — Assessment & Plan Note (Signed)
Congratulated on quitting. Still has cravings but refusing chanttix; thinks she can hold off

## 2012-01-03 NOTE — Assessment & Plan Note (Signed)
Post lobectomy 12/27/11 - FEv1 1.28L/60%. Walk test 185 feet x 3 laps: no desaturation. Did not tolerate spiriva. Maintained on symbicort. STable disease. Advised to cointinue symbicort. Will check alpha 1 sometime infuture

## 2012-01-03 NOTE — Assessment & Plan Note (Signed)
This is new and related to lobectomy. She feels this is improving and prefers to watch this as opposed to take medications. Will monitor

## 2012-01-03 NOTE — Assessment & Plan Note (Signed)
Stage 1 A NSCLC. Post lobectomy. Will check with DR Arbutus Ped who will do post lobectomys surveillance; for now advised to keep appt with him

## 2012-02-28 ENCOUNTER — Encounter (HOSPITAL_COMMUNITY): Payer: Self-pay | Admitting: Pharmacy Technician

## 2012-02-28 NOTE — Patient Instructions (Addendum)
20 [name]  02/28/2012   Your procedure is scheduled on:  03-07-12 AT 8:45 AM  Report to SHORT STAY DEPT  at 6:15 AM.  Call this number if you have problems the morning of surgery: [phone]   Remember:   NO FOOD OF LIQUIDS AFTER MIDNIGHT    Take these medicines the morning of surgery with A SIP OF WATER: PROZAC / ZYRTEC / TRAMADOL IF NEEDED / USE SYMBICORT INHALER    Do not wear jewelry, make-up or nail polish.  Do not wear lotions, powders, or perfumes.   Do not shave legs or underarms 48 hrs. before surgery (men may shave face)  Do not bring valuables to the hospital.  Contacts, dentures or bridgework may not be worn into surgery.  Leave suitcase in the car. After surgery it may be brought to your room.  For patients admitted to the hospital, checkout time is 11:00 AM the day of discharge.   Patients discharged the day of surgery will not be allowed to drive home.    Special Instructions:   Please read over the following fact sheets that you were given: MRSA  Information / Incentive Spirometer               SHOWER WITH BETASEPT THE NIGHT BEFORE SURGERY AND THE MORNING OF SURGERY

## 2012-02-29 ENCOUNTER — Encounter (HOSPITAL_COMMUNITY): Payer: Self-pay

## 2012-02-29 ENCOUNTER — Encounter (HOSPITAL_COMMUNITY)
Admission: RE | Admit: 2012-02-29 | Discharge: 2012-02-29 | Disposition: A | Discharge status: Home / Self Care | Payer: Medicare Other | Source: Ambulatory Visit | Attending: Orthopedic Surgery | Admitting: Orthopedic Surgery

## 2012-02-29 HISTORY — DX: Malignant neoplasm of unspecified part of unspecified bronchus or lung: C34.90

## 2012-02-29 LAB — BASIC METABOLIC PANEL
BUN: 13 mg/dL (ref 6–23)
Calcium: 9.5 mg/dL (ref 8.4–10.5)
Creatinine, Ser: 0.68 mg/dL (ref 0.50–1.10)
GFR calc Af Amer: >90 mL/min (ref >90)
GFR calc non Af Amer: 88 mL/min — ABNORMAL LOW (ref >90)

## 2012-02-29 LAB — CBC
MCHC: 35 g/dL (ref 30.0–36.0)
Platelets: 186 10*3/uL (ref 150–400)
RDW: 12.8 % (ref 11.5–15.5)
WBC: 6.2 10*3/uL (ref 4.0–10.5)

## 2012-02-29 LAB — URINALYSIS, ROUTINE W REFLEX MICROSCOPIC
Bilirubin Urine: NEGATIVE
Ketones, ur: NEGATIVE mg/dL
Nitrite: NEGATIVE
Protein, ur: NEGATIVE mg/dL
Urobilinogen, UA: 0.2 mg/dL (ref 0.0–1.0)
pH: 5.5 (ref 5.0–8.0)

## 2012-02-29 LAB — APTT: aPTT: 33 seconds (ref 24–37)

## 2012-02-29 LAB — SURGICAL PCR SCREEN
MRSA, PCR: POSITIVE — AB
Staphylococcus aureus: POSITIVE — AB

## 2012-02-29 LAB — PROTIME-INR
INR: 0.96 (ref 0.00–1.49)
Prothrombin Time: 13 seconds (ref 11.6–15.2)

## 2012-02-29 MED ORDER — CHLORHEXIDINE GLUCONATE 4 % EX LIQD
60.0000 mL | Freq: Once | CUTANEOUS | Status: DC
  Filled 2012-02-29: qty 60

## 2012-03-01 NOTE — Pre-Procedure Instructions (Signed)
UA faxed to Dr. Olin - confirmation recieved 

## 2012-03-01 NOTE — Progress Notes (Signed)
H&P performed 03/01/12 Dictation # [id number]

## 2012-03-02 NOTE — H&P (Signed)
[name], [name]               ACCOUNT NO.:  [id number]  MEDICAL RECORD NO.:  [id number]  LOCATION:  [facility]                         FACILITY:  [facility]  PHYSICIAN:  Jaquelyn Bitter. Lizzeth Meder, P.A.DATE OF BIRTH:  07-Mar-1941  DATE OF ADMISSION:  02/29/2012 DATE OF DISCHARGE:  02/29/2012                             HISTORY & PHYSICAL   DATE OF SURGERY:  March 07, 2012.  ADMITTING DIAGNOSIS:  End-stage osteoarthritis, right hip.  HISTORY OF PRESENT ILLNESS:  This is a 71 year old lady with a history of end-stage osteoarthritis of her right hip that has failed conservative treatment.  After discussion of treatments, benefits, risks, and options, the patient is now scheduled for total hip arthroplasty by anterior approach.  The patient's medical doctor is Dr. Allen Norris, her cardiologist is Dr. Allyson Sabal, and her thoracic surgeon Dr. Tyrone Sage.  Note that she is not a candidate for trans-Cinnamic acid or dexamethasone, and we will not receive either at surgery.  She is planning on going home after surgery.  She is given her home medicines of aspirin, Robaxin, iron, MiraLAX, and Colace to take postoperatively.  PAST MEDICAL HISTORY:  Drug allergy to SULFA with a rash, PENICILLIN with a rash, and CLINDAMYCIN with nausea.  CURRENT MEDICATIONS:  Zyrtec 1 twice a day, Prozac 20 mg twice a day, Norvasc 5 mg once a day, Symbicort 2 puffs twice a day, and tramadol one every 8-12 hours p.r.n. for pain.  PREVIOUS SURGERIES:  Herniorrhaphy, rotator cuff repair, hysterectomy, left total hip arthroplasty, partial resection of right lung secondary to non-small cell lung cancer, tonsils, adenoids, and carpal tunnel release.  SERIOUS MEDICAL ILLNESSES:  Allergies, depression, hypertension, COPD, irritable bowel syndrome, history of lung cancer, gout, and arthritis.  FAMILY HISTORY:  Positive for arthritis.  SOCIAL HISTORY:  The patient is married.  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  She used to smoke, but does not any further and has 2 drinks per day, and again plans on going home after surgery.  REVIEW OF SYSTEMS:  CENTRAL NERVOUS SYSTEM:  Positive for history of migraine, headaches, but no more since menopause, positive for insomnia and tinnitus.  PULMONARY:  Positive for shortness of breath on exertion with history of lung cancer with partial resection of right lung. CARDIOVASCULAR:  Negative for chest pain or palpitation.  GI:  Positive for diarrhea with irritable bowel syndrome, negative for ulcers or hepatitis.  GU:  Negative for urinary tract difficulty. MUSCULOSKELETAL:  Positive as in HPI.  PHYSICAL EXAMINATION:  VITAL SIGNS:  BP 135/88, pulse 72 and regular, respirations 14. HEENT:  Head normocephalic.  Nose patent.  Ears patent.  Pupils equal, round, and reactive to light.  Throat without injection. NECK:  Supple without adenopathy.  Carotids 2+ without bruit. CHEST:  Clear to auscultation.  No rales or rhonchi.  Respirations are 14.  Note that there are slightly decreased sounds in the mid posterior right chest. HEART:  Regular rate and rhythm at 72 beats per minute without murmur. ABDOMEN:  Soft with active bowel sounds.  No masses or organomegaly. NEUROLOGIC:  The patient is alert and oriented to time, place, and person.  Cranial nerves II through XII grossly intact. EXTREMITIES:  Shows the right hip with 3 degrees from full extension, further flexion to 110, external rotation of 35, internal rotation of 15.  Neurovascular status intact.  IMPRESSION:  End-stage osteoarthritis, right hip.  PLAN:  Total hip arthroplasty, right hip.     Jaquelyn Bitter. Ernestene Kiel.     SJC/MEDQ  D:  03/01/2012  T:  03/02/2012  Job:  [id number]

## 2012-03-03 NOTE — Pre-Procedure Instructions (Signed)
Notified patient of time change for surgery- instructed to be at Short Stay at 0515 and surgery is 0715. Instructed nothing to eat or drink after midnight

## 2012-03-07 ENCOUNTER — Inpatient Hospital Stay (HOSPITAL_COMMUNITY)
Admission: RE | Admit: 2012-03-07 | Discharge: 2012-03-09 | DRG: 470 | Disposition: A | Discharge status: Home Health | Payer: Medicare Other | Source: Ambulatory Visit | Attending: Orthopedic Surgery | Admitting: Orthopedic Surgery

## 2012-03-07 ENCOUNTER — Encounter (HOSPITAL_COMMUNITY): Payer: Self-pay | Admitting: Anesthesiology

## 2012-03-07 ENCOUNTER — Encounter (HOSPITAL_COMMUNITY): Payer: Self-pay | Admitting: *Deleted

## 2012-03-07 ENCOUNTER — Ambulatory Visit (HOSPITAL_COMMUNITY): Payer: Medicare Other | Admitting: Anesthesiology

## 2012-03-07 ENCOUNTER — Ambulatory Visit (HOSPITAL_COMMUNITY): Payer: Medicare Other

## 2012-03-07 ENCOUNTER — Encounter (HOSPITAL_COMMUNITY): Payer: Self-pay | Admitting: Orthopedic Surgery

## 2012-03-07 ENCOUNTER — Encounter (HOSPITAL_COMMUNITY)
Admission: RE | Disposition: A | Discharge status: Home Health | Payer: Self-pay | Source: Ambulatory Visit | Attending: Orthopedic Surgery

## 2012-03-07 DIAGNOSIS — J449 Chronic obstructive pulmonary disease, unspecified: Secondary | ICD-10-CM | POA: Diagnosis present

## 2012-03-07 DIAGNOSIS — J4489 Other specified chronic obstructive pulmonary disease: Secondary | ICD-10-CM | POA: Diagnosis present

## 2012-03-07 DIAGNOSIS — M1610 Unilateral primary osteoarthritis, unspecified hip: Principal | ICD-10-CM | POA: Diagnosis present

## 2012-03-07 DIAGNOSIS — Z96649 Presence of unspecified artificial hip joint: Secondary | ICD-10-CM

## 2012-03-07 DIAGNOSIS — F3289 Other specified depressive episodes: Secondary | ICD-10-CM | POA: Diagnosis present

## 2012-03-07 DIAGNOSIS — Z85118 Personal history of other malignant neoplasm of bronchus and lung: Secondary | ICD-10-CM

## 2012-03-07 DIAGNOSIS — D7589 Other specified diseases of blood and blood-forming organs: Secondary | ICD-10-CM

## 2012-03-07 DIAGNOSIS — I10 Essential (primary) hypertension: Secondary | ICD-10-CM | POA: Diagnosis present

## 2012-03-07 DIAGNOSIS — M169 Osteoarthritis of hip, unspecified: Principal | ICD-10-CM | POA: Diagnosis present

## 2012-03-07 DIAGNOSIS — F329 Major depressive disorder, single episode, unspecified: Secondary | ICD-10-CM | POA: Diagnosis present

## 2012-03-07 DIAGNOSIS — Z01812 Encounter for preprocedural laboratory examination: Secondary | ICD-10-CM

## 2012-03-07 HISTORY — DX: Presence of unspecified artificial hip joint: Z96.649

## 2012-03-07 HISTORY — PX: TOTAL HIP ARTHROPLASTY: SHX124

## 2012-03-07 LAB — TYPE AND SCREEN
ABO/RH(D): A POS
Antibody Screen: NEGATIVE

## 2012-03-07 LAB — ABO/RH: ABO/RH(D): A POS

## 2012-03-07 SURGERY — ARTHROPLASTY, HIP, TOTAL, ANTERIOR APPROACH
Anesthesia: Spinal | Site: Hip | Laterality: Right | Wound class: Clean

## 2012-03-07 MED ORDER — MIDAZOLAM HCL 5 MG/5ML IJ SOLN
INTRAMUSCULAR | Status: DC | PRN
  Administered 2012-03-07: 2 mg via INTRAVENOUS

## 2012-03-07 MED ORDER — PHENOL 1.4 % MT LIQD: 1.0000 | OROMUCOSAL | Status: DC | PRN

## 2012-03-07 MED ORDER — DIPHENHYDRAMINE HCL 12.5 MG/5ML PO ELIX: 25.0000 mg | ORAL_SOLUTION | Freq: Four times a day (QID) | ORAL | Status: DC | PRN

## 2012-03-07 MED ORDER — ALBUTEROL SULFATE HFA 108 (90 BASE) MCG/ACT IN AERS
2.0000 | INHALATION_SPRAY | Freq: Four times a day (QID) | RESPIRATORY_TRACT | Status: DC | PRN
  Filled 2012-03-07: qty 6.7

## 2012-03-07 MED ORDER — ZOLPIDEM TARTRATE 5 MG PO TABS
5.0000 mg | ORAL_TABLET | Freq: Every evening | ORAL | Status: DC | PRN
  Administered 2012-03-08 (×2): 5 mg via ORAL
  Filled 2012-03-07 (×2): qty 1

## 2012-03-07 MED ORDER — DEXTROSE 5 % IV SOLN
500.0000 mg | Freq: Four times a day (QID) | INTRAVENOUS | Status: DC | PRN
  Administered 2012-03-07 (×2): 500 mg via INTRAVENOUS
  Filled 2012-03-07 (×2): qty 5

## 2012-03-07 MED ORDER — RIVAROXABAN 10 MG PO TABS
10.0000 mg | ORAL_TABLET | Freq: Every day | ORAL | Status: DC
  Administered 2012-03-08 – 2012-03-09 (×2): 10 mg via ORAL
  Filled 2012-03-07 (×3): qty 1

## 2012-03-07 MED ORDER — MEPERIDINE HCL 50 MG/ML IJ SOLN: 6.2500 mg | INTRAMUSCULAR | Status: DC | PRN

## 2012-03-07 MED ORDER — PROMETHAZINE HCL 25 MG/ML IJ SOLN: 6.2500 mg | INTRAMUSCULAR | Status: DC | PRN

## 2012-03-07 MED ORDER — PROPOFOL 10 MG/ML IV EMUL
INTRAVENOUS | Status: DC | PRN
  Administered 2012-03-07: 50 ug/kg/min via INTRAVENOUS

## 2012-03-07 MED ORDER — CETIRIZINE HCL 10 MG PO TABS
10.0000 mg | ORAL_TABLET | Freq: Every day | ORAL | Status: DC
  Filled 2012-03-07: qty 1

## 2012-03-07 MED ORDER — HYDROCODONE-ACETAMINOPHEN 7.5-325 MG PO TABS
1.0000 | ORAL_TABLET | ORAL | Status: DC
  Administered 2012-03-07: 2 via ORAL
  Administered 2012-03-07 (×2): 1 via ORAL
  Administered 2012-03-08 – 2012-03-09 (×7): 2 via ORAL
  Filled 2012-03-07 (×10): qty 2
  Filled 2012-03-07: qty 1

## 2012-03-07 MED ORDER — BUPIVACAINE HCL (PF) 0.5 % IJ SOLN
INTRAMUSCULAR | Status: AC
  Filled 2012-03-07: qty 30

## 2012-03-07 MED ORDER — PHENYLEPHRINE HCL 10 MG/ML IJ SOLN
INTRAMUSCULAR | Status: DC | PRN
  Administered 2012-03-07: 40 ug via INTRAVENOUS
  Administered 2012-03-07: 80 ug via INTRAVENOUS
  Administered 2012-03-07: 40 ug via INTRAVENOUS

## 2012-03-07 MED ORDER — MENTHOL 3 MG MT LOZG: 1.0000 | LOZENGE | OROMUCOSAL | Status: DC | PRN

## 2012-03-07 MED ORDER — FLUOXETINE HCL 20 MG PO TABS
20.0000 mg | ORAL_TABLET | Freq: Two times a day (BID) | ORAL | Status: DC
  Administered 2012-03-07 – 2012-03-09 (×4): 20 mg via ORAL
  Filled 2012-03-07 (×6): qty 1

## 2012-03-07 MED ORDER — HYDROMORPHONE HCL PF 1 MG/ML IJ SOLN
0.2000 mg | INTRAMUSCULAR | Status: DC | PRN
  Administered 2012-03-07: 0.6 mg via INTRAVENOUS
  Filled 2012-03-07: qty 1

## 2012-03-07 MED ORDER — EPHEDRINE SULFATE 50 MG/ML IJ SOLN
INTRAMUSCULAR | Status: DC | PRN
  Administered 2012-03-07 (×2): 5 mg via INTRAVENOUS

## 2012-03-07 MED ORDER — LACTATED RINGERS IV SOLN: INTRAVENOUS | Status: DC

## 2012-03-07 MED ORDER — ONDANSETRON HCL 4 MG/2ML IJ SOLN
INTRAMUSCULAR | Status: DC | PRN
  Administered 2012-03-07: 4 mg via INTRAVENOUS

## 2012-03-07 MED ORDER — ALUM & MAG HYDROXIDE-SIMETH 200-200-20 MG/5ML PO SUSP: 30.0000 mL | ORAL | Status: DC | PRN

## 2012-03-07 MED ORDER — ONDANSETRON HCL 4 MG PO TABS
4.0000 mg | ORAL_TABLET | Freq: Four times a day (QID) | ORAL | Status: DC | PRN
  Administered 2012-03-09: 4 mg via ORAL
  Filled 2012-03-07: qty 1

## 2012-03-07 MED ORDER — ACETAMINOPHEN 10 MG/ML IV SOLN
INTRAVENOUS | Status: AC
  Filled 2012-03-07: qty 100

## 2012-03-07 MED ORDER — METHOCARBAMOL 500 MG PO TABS
500.0000 mg | ORAL_TABLET | Freq: Four times a day (QID) | ORAL | Status: DC | PRN
  Administered 2012-03-08 – 2012-03-09 (×3): 500 mg via ORAL
  Filled 2012-03-07 (×3): qty 1

## 2012-03-07 MED ORDER — METOCLOPRAMIDE HCL 5 MG/ML IJ SOLN: 5.0000 mg | Freq: Three times a day (TID) | INTRAMUSCULAR | Status: DC | PRN

## 2012-03-07 MED ORDER — HYDROMORPHONE HCL PF 1 MG/ML IJ SOLN: 0.2500 mg | INTRAMUSCULAR | Status: DC | PRN

## 2012-03-07 MED ORDER — DEXAMETHASONE SODIUM PHOSPHATE 10 MG/ML IJ SOLN
INTRAMUSCULAR | Status: DC | PRN
  Administered 2012-03-07: 10 mg via INTRAVENOUS

## 2012-03-07 MED ORDER — BUDESONIDE-FORMOTEROL FUMARATE 160-4.5 MCG/ACT IN AERO
2.0000 | INHALATION_SPRAY | Freq: Two times a day (BID) | RESPIRATORY_TRACT | Status: DC
  Administered 2012-03-07 – 2012-03-09 (×4): 2 via RESPIRATORY_TRACT
  Filled 2012-03-07: qty 6

## 2012-03-07 MED ORDER — FERROUS SULFATE 325 (65 FE) MG PO TABS
325.0000 mg | ORAL_TABLET | Freq: Three times a day (TID) | ORAL | Status: DC
  Administered 2012-03-07 – 2012-03-09 (×6): 325 mg via ORAL
  Filled 2012-03-07 (×9): qty 1

## 2012-03-07 MED ORDER — CLINDAMYCIN PHOSPHATE 600 MG/50ML IV SOLN
600.0000 mg | Freq: Four times a day (QID) | INTRAVENOUS | Status: AC
  Administered 2012-03-07 (×2): 600 mg via INTRAVENOUS
  Filled 2012-03-07 (×2): qty 50

## 2012-03-07 MED ORDER — ACETAMINOPHEN 10 MG/ML IV SOLN
INTRAVENOUS | Status: DC | PRN
  Administered 2012-03-07: 1000 mg via INTRAVENOUS

## 2012-03-07 MED ORDER — 0.9 % SODIUM CHLORIDE (POUR BTL) OPTIME
TOPICAL | Status: DC | PRN
  Administered 2012-03-07: 1000 mL

## 2012-03-07 MED ORDER — CLINDAMYCIN PHOSPHATE 900 MG/50ML IV SOLN
INTRAVENOUS | Status: AC
  Filled 2012-03-07: qty 50

## 2012-03-07 MED ORDER — NON FORMULARY: 10.0000 mg | Freq: Every morning | Status: DC

## 2012-03-07 MED ORDER — CETIRIZINE HCL 10 MG PO TABS
10.0000 mg | ORAL_TABLET | Freq: Every day | ORAL | Status: DC
  Administered 2012-03-08 – 2012-03-09 (×2): 10 mg via ORAL
  Filled 2012-03-07 (×2): qty 1

## 2012-03-07 MED ORDER — AMLODIPINE BESYLATE 5 MG PO TABS
5.0000 mg | ORAL_TABLET | Freq: Every day | ORAL | Status: DC
  Administered 2012-03-08: 5 mg via ORAL
  Filled 2012-03-07 (×3): qty 1

## 2012-03-07 MED ORDER — METOCLOPRAMIDE HCL 10 MG PO TABS: 5.0000 mg | ORAL_TABLET | Freq: Three times a day (TID) | ORAL | Status: DC | PRN

## 2012-03-07 MED ORDER — SODIUM CHLORIDE 0.9 % IV SOLN
INTRAVENOUS | Status: AC
  Filled 2012-03-07 (×9): qty 1000

## 2012-03-07 MED ORDER — ONDANSETRON HCL 4 MG/2ML IJ SOLN: 4.0000 mg | Freq: Four times a day (QID) | INTRAMUSCULAR | Status: DC | PRN

## 2012-03-07 MED ORDER — LACTATED RINGERS IV SOLN
INTRAVENOUS | Status: DC | PRN
  Administered 2012-03-07 (×3): via INTRAVENOUS

## 2012-03-07 MED ORDER — FENTANYL CITRATE 0.05 MG/ML IJ SOLN
INTRAMUSCULAR | Status: DC | PRN
  Administered 2012-03-07 (×2): 50 ug via INTRAVENOUS

## 2012-03-07 MED ORDER — CLINDAMYCIN PHOSPHATE 900 MG/50ML IV SOLN
900.0000 mg | INTRAVENOUS | Status: AC
  Administered 2012-03-07: 900 mg via INTRAVENOUS

## 2012-03-07 MED ORDER — FLUTICASONE PROPIONATE 50 MCG/ACT NA SUSP
2.0000 | Freq: Every day | NASAL | Status: DC | PRN
  Filled 2012-03-07: qty 16

## 2012-03-07 MED ORDER — BUPIVACAINE HCL (PF) 0.5 % IJ SOLN
INTRAMUSCULAR | Status: DC | PRN
  Administered 2012-03-07: 15 mg

## 2012-03-07 SURGICAL SUPPLY — 41 items
BAG ZIPLOCK 12X15 (MISCELLANEOUS) ×4 IMPLANT
BLADE SAW SGTL 18X1.27X75 (BLADE) ×2 IMPLANT
CLOTH BEACON ORANGE TIMEOUT ST (SAFETY) ×2 IMPLANT
DERMABOND ADVANCED (GAUZE/BANDAGES/DRESSINGS) ×1
DERMABOND ADVANCED .7 DNX12 (GAUZE/BANDAGES/DRESSINGS) ×1 IMPLANT
DRAPE C-ARM 42X72 X-RAY (DRAPES) ×2 IMPLANT
DRAPE STERI IOBAN 125X83 (DRAPES) ×2 IMPLANT
DRAPE U-SHAPE 47X51 STRL (DRAPES) ×6 IMPLANT
DRSG AQUACEL AG ADV 3.5X10 (GAUZE/BANDAGES/DRESSINGS) ×2 IMPLANT
DRSG TEGADERM 4X4.75 (GAUZE/BANDAGES/DRESSINGS) ×2 IMPLANT
DURAPREP 26ML APPLICATOR (WOUND CARE) ×2 IMPLANT
ELECT BLADE TIP CTD 4 INCH (ELECTRODE) ×2 IMPLANT
ELECT REM PT RETURN 9FT ADLT (ELECTROSURGICAL) ×2
ELECTRODE REM PT RTRN 9FT ADLT (ELECTROSURGICAL) ×1 IMPLANT
EVACUATOR 1/8 PVC DRAIN (DRAIN) IMPLANT
FACESHIELD LNG OPTICON STERILE (SAFETY) ×8 IMPLANT
GAUZE SPONGE 2X2 8PLY STRL LF (GAUZE/BANDAGES/DRESSINGS) ×1 IMPLANT
GLOVE BIOGEL PI IND STRL 6.5 (GLOVE) ×1 IMPLANT
GLOVE BIOGEL PI IND STRL 7.5 (GLOVE) ×1 IMPLANT
GLOVE BIOGEL PI IND STRL 8 (GLOVE) ×1 IMPLANT
GLOVE BIOGEL PI INDICATOR 6.5 (GLOVE) ×1
GLOVE BIOGEL PI INDICATOR 7.5 (GLOVE) ×1
GLOVE BIOGEL PI INDICATOR 8 (GLOVE) ×1
GLOVE ECLIPSE 8.0 STRL XLNG CF (GLOVE) ×2 IMPLANT
GLOVE ORTHO TXT STRL SZ7.5 (GLOVE) ×6 IMPLANT
GLOVE SURG SS PI 6.5 STRL IVOR (GLOVE) ×2 IMPLANT
GLOVE SURG SS PI 7.5 STRL IVOR (GLOVE) ×8 IMPLANT
GOWN BRE IMP PREV XXLGXLNG (GOWN DISPOSABLE) ×4 IMPLANT
GOWN STRL NON-REIN LRG LVL3 (GOWN DISPOSABLE) ×4 IMPLANT
KIT BASIN OR (CUSTOM PROCEDURE TRAY) ×2 IMPLANT
PACK TOTAL JOINT (CUSTOM PROCEDURE TRAY) ×2 IMPLANT
PADDING CAST COTTON 6X4 STRL (CAST SUPPLIES) ×2 IMPLANT
SPONGE GAUZE 2X2 STER 10/PKG (GAUZE/BANDAGES/DRESSINGS) ×1
SUCTION FRAZIER 12FR DISP (SUCTIONS) ×2 IMPLANT
SUT MNCRL AB 4-0 PS2 18 (SUTURE) ×2 IMPLANT
SUT VIC AB 1 CT1 36 (SUTURE) ×8 IMPLANT
SUT VIC AB 2-0 CT1 27 (SUTURE) ×2
SUT VIC AB 2-0 CT1 TAPERPNT 27 (SUTURE) ×2 IMPLANT
SUT VLOC 180 0 24IN GS25 (SUTURE) ×2 IMPLANT
TOWEL OR 17X26 10 PK STRL BLUE (TOWEL DISPOSABLE) ×4 IMPLANT
TRAY FOLEY CATH 14FRSI W/METER (CATHETERS) ×2 IMPLANT

## 2012-03-07 NOTE — Anesthesia Procedure Notes (Signed)
Spinal  Patient location during procedure: OR End time: 03/07/2012 7:49 AM Staffing CRNA/Resident: [name] Performed by: resident/CRNA  Preanesthetic Checklist Completed: patient identified, site marked, surgical consent, pre-op evaluation, timeout performed, IV checked, risks and benefits discussed and monitors and equipment checked Spinal Block Patient position: sitting Prep: Betadine Patient monitoring: heart rate, continuous pulse ox and blood pressure Approach: midline Location: L2-3 Injection technique: single-shot Needle Needle type: Sprotte  Needle gauge: 24 G Needle length: 5 cm Assessment Sensory level: T4 Additional Notes Expiration date of kit checked and confirmed. Patient tolerated procedure well, without complications.

## 2012-03-07 NOTE — Interval H&P Note (Signed)
History and Physical Interval Note:  03/07/2012 7:34 AM  [name]  has presented today for surgery, with the diagnosis of right hip osteoarthritis  The various methods of treatment have been discussed with the patient and family. After consideration of risks, benefits and other options for treatment, the patient has consented to  Procedure(s) (LRB): RIGHT TOTAL HIP ARTHROPLASTY ANTERIOR APPROACH (Right) as a surgical intervention .  The patient's history has been reviewed, patient examined, no change in status, stable for surgery.  I have reviewed the patient's chart and labs.  Questions were answered to the patient's satisfaction.     [name]

## 2012-03-07 NOTE — OR Nursing (Signed)
Late entries to operative record per Milana Kidney, RN

## 2012-03-07 NOTE — Anesthesia Preprocedure Evaluation (Addendum)
Anesthesia Evaluation    History of Anesthesia Complications (+) PONV  Airway Mallampati: I TM Distance: >3 FB Neck ROM: Full    Dental No notable dental hx. (+) Teeth Intact and Dental Advisory Given   Pulmonary neg pneumonia -, COPDformer smoker,  breath sounds clear to auscultation  Pulmonary exam normal       Cardiovascular hypertension, Pt. on medications Rhythm:Regular Rate:Normal     Neuro/Psych Anxiety Depression OCD   GI/Hepatic   Endo/Other    Renal/GU      Musculoskeletal   Abdominal   Peds  Hematology   Anesthesia Other Findings   Reproductive/Obstetrics                         Anesthesia Physical Anesthesia Plan  ASA: III  Anesthesia Plan: Spinal   Post-op Pain Management:    Induction:   Airway Management Planned: Simple Face Mask  Additional Equipment:   Intra-op Plan:   Post-operative Plan:   Informed Consent: I have reviewed the patients History and Physical, chart, labs and discussed the procedure including the risks, benefits and alternatives for the proposed anesthesia with the patient or authorized representative who has indicated his/her understanding and acceptance.   Dental advisory given  Plan Discussed with: CRNA  Anesthesia Plan Comments:        Anesthesia Quick Evaluation                                   Anesthesia Evaluation  Patient identified by MRN, date of birth, ID band Patient awake    Reviewed: Allergy & Precautions, H&P , NPO status , Patient's Chart, lab work & pertinent test results  Airway Mallampati: I TM Distance: >3 FB Neck ROM: full    Dental   Pulmonary shortness of breath, pneumonia , COPDCurrent Smoker,          Cardiovascular hypertension, Rhythm:regular Rate:Normal     Neuro/Psych  Headaches, PSYCHIATRIC DISORDERS    GI/Hepatic   Endo/Other    Renal/GU      Musculoskeletal   Abdominal     Peds  Hematology   Anesthesia Other Findings   Reproductive/Obstetrics                          Anesthesia Physical Anesthesia Plan  ASA: II  Anesthesia Plan: General ETT   Post-op Pain Management:    Induction: Intravenous  Airway Management Planned: Double Lumen EBT  Additional Equipment: Arterial line and CVP  Intra-op Plan:   Post-operative Plan:   Informed Consent: I have reviewed the patients History and Physical, chart, labs and discussed the procedure including the risks, benefits and alternatives for the proposed anesthesia with the patient or authorized representative who has indicated his/her understanding and acceptance.     Plan Discussed with: Anesthesiologist, CRNA and Surgeon  Anesthesia Plan Comments:         Anesthesia Quick Evaluation                                   Anesthesia Evaluation  Patient identified by MRN, date of birth, ID band Patient awake    Reviewed: Allergy & Precautions, H&P , NPO status , Patient's Chart, lab work & pertinent test results  Airway Mallampati: I TM Distance: >3 FB Neck ROM: full  Dental   Pulmonary shortness of breath, pneumonia , COPDCurrent Smoker,          Cardiovascular hypertension, Rhythm:regular Rate:Normal     Neuro/Psych  Headaches, PSYCHIATRIC DISORDERS    GI/Hepatic   Endo/Other    Renal/GU      Musculoskeletal   Abdominal   Peds  Hematology   Anesthesia Other Findings   Reproductive/Obstetrics                          Anesthesia Physical Anesthesia Plan  ASA: II  Anesthesia Plan: General ETT   Post-op Pain Management:    Induction: Intravenous  Airway Management Planned: Double Lumen EBT  Additional Equipment: Arterial line and CVP  Intra-op Plan:   Post-operative Plan:   Informed Consent: I have reviewed the patients History and Physical, chart, labs and discussed the procedure including the  risks, benefits and alternatives for the proposed anesthesia with the patient or authorized representative who has indicated his/her understanding and acceptance.     Plan Discussed with: Anesthesiologist, CRNA and Surgeon  Anesthesia Plan Comments:         Anesthesia Quick Evaluation

## 2012-03-07 NOTE — Progress Notes (Signed)
Utilization review completed.  

## 2012-03-07 NOTE — Op Note (Signed)
NAME:  [name]                ACCOUNT NO.: [id number]      MEDICAL RECORD NO.: [id number]      FACILITY:  [facility]      PHYSICIAN:  Durene Romans D  DATE OF BIRTH:  May 14, 1941     DATE OF PROCEDURE:  03/07/2012                                 OPERATIVE REPORT         PREOPERATIVE DIAGNOSIS: Right  hip osteoarthritis.      POSTOPERATIVE DIAGNOSIS:  Right hip osteoarthritis.      PROCEDURE:  Right total hip replacement through an anterior approach   utilizing DePuy THR system, component size 52mm pinnacle cup, a size 36+4 neutral   Altrex liner, a size 2 standard Tri Lock stem with a 36-15mm metal ball.      SURGEON:  Madlyn Frankel. Charlann Boxer, M.D.      ASSISTANT:  Leilani Able, PA      ANESTHESIA:  Spinal.      SPECIMENS:  None.      COMPLICATIONS:  None.      BLOOD LOSS:  400 cc     DRAINS:  One Hemovac.      INDICATION OF THE PROCEDURE:  [name] is a 71 y.o. female who had   presented to office for evaluation of right hip pain.  Radiographs revealed   progressive degenerative changes with bone-on-bone   articulation to the  hip joint.  The patient had painful limited range of   motion significantly affecting their overall quality of life.  The patient was failing to    respond to conservative measures, and at this point was ready   to proceed with more definitive measures.  The patient has noted progressive   degenerative changes in his hip, progressive problems and dysfunction   with regarding the hip prior to surgery.  Consent was obtained for   benefit of pain relief.  Specific risk of infection, DVT, component   failure, dislocation, need for revision surgery, as well discussion of   the anterior versus posterior approach were reviewed.  Consent was   obtained for benefit of anterior pain relief through an anterior   approach.      PROCEDURE IN DETAIL:  The patient was brought to operative theater.   Once adequate anesthesia,  preoperative antibiotics, 900mg  Clindamycin administered.   The patient was positioned supine on the OSI Hanna table.  Once adequate   padding of boney process was carried out, we had predraped out the hip, and  used fluoroscopy to confirm orientation of the pelvis and position.      The right hip was then prepped and draped from proximal iliac crest to   mid thigh with shower curtain technique.      Time-out was performed identifying the patient, planned procedure, and   extremity.     An incision was then made 2 cm distal and lateral to the   anterior superior iliac spine extending over the orientation of the   tensor fascia lata muscle and sharp dissection was carried down to the   fascia of the muscle and protractor placed in the soft tissues.      The fascia was then incised.  The muscle belly was identified and swept  laterally and retractor placed along the superior neck.  Following   cauterization of the circumflex vessels and removing some pericapsular   fat, a second cobra retractor was placed on the inferior neck.  A third   retractor was placed on the anterior acetabulum after elevating the   anterior rectus.  A L-capsulotomy was along the line of the   superior neck to the trochanteric fossa, then extended proximally and   distally.  Tag sutures were placed and the retractors were then placed   intracapsular.  We then identified the trochanteric fossa and   orientation of my neck cut, confirmed this radiographically   and then made a neck osteotomy with the femur on traction.  The femoral   head was removed without difficulty or complication.  Traction was let   off and retractors were placed posterior and anterior around the   acetabulum.      The labrum and foveal tissue were debrided.  I began reaming with a 45mm   reamer and reamed up to 51mm reamer with good bony bed preparation and a 52   cup was chosen.  The final 52mm Pinnacle cup was then impacted under  fluoroscopy  to confirm the depth of penetration and orientation with respect to   abduction.  A screw was placed followed by the hole eliminator.  The final   36+4 neutral Altrex liner was impacted with good visualized rim fit.  The cup was positioned anatomically within the acetabular portion of the pelvis.      At this point, the femur was rolled at 80 degrees.  Further capsule was   released off the inferior aspect of the femoral neck.  I then   released the superior capsule proximally.  The hook was placed laterally   along the femur and elevated manually and held in position with the bed   hook.  The leg was then extended and adducted with the leg rolled to 100   degrees of external rotation.  Once the proximal femur was fully   exposed, I used a box osteotome to set orientation.  I then began   broaching with the starting chili pepper broach and passed this by hand and then broached up to 2.  With the 2 broach in place I chose a standard neck and did a trial reduction.  The offset was appropriate, leg lengths   appeared to be equal, confirmed radiographically.   Given these findings, I went ahead and dislocated the hip, repositioned all   retractors and positioned the right hip in the extended and abducted position.  The final 2 standard Tri Lock stem was   chosen and it was impacted down to the level of neck cut.  Based on this   and the trial reduction, a 36-2 metal ball was chosen to better match leg length and offset and   impacted onto a clean and dry trunnion, and the hip was reduced.  The   hip had been irrigated throughout the case again at this point.  I did   reapproximate the superior capsular leaflet to the anterior leaflet   using #1 Vicryl, placed a medium Hemovac drain deep.  The fascia of the   tensor fascia lata muscle was then reapproximated using #1 Vicryl.  The   remaining wound was closed with 2-0 Vicryl and running 4-0 Monocryl.   The hip was cleaned, dried, and  dressed sterilely using Dermabond and   Aquacel dressing.  Drain site  dressed separately.  She was then brought   to recovery room in stable condition tolerating the procedure well.    Leilani Able, PA-C was present for the entirety of the case involved from   preoperative positioning, perioperative retractor management, general   facilitation of the case, as well as primary wound closure as assistant.            Madlyn Frankel Charlann Boxer, M.D.            MDO/MEDQ  D:  06/01/2011  T:  06/01/2011  Job:  [id number]      Electronically Signed by Durene Romans M.D. on 06/07/2011 09:15:38 AM

## 2012-03-07 NOTE — Anesthesia Postprocedure Evaluation (Signed)
  Anesthesia Post-op Note  Patient: [name]  Procedure(s) Performed: Procedure(s) (LRB): TOTAL HIP ARTHROPLASTY ANTERIOR APPROACH (Right)  Patient Location: PACU  Anesthesia Type: General  Level of Consciousness: awake and alert   Airway and Oxygen Therapy: Patient Spontanous Breathing  Post-op Pain: mild  Post-op Assessment: Post-op Vital signs reviewed, Patient's Cardiovascular Status Stable, Respiratory Function Stable, Patent Airway and No signs of Nausea or vomiting  Post-op Vital Signs: stable  Complications: No apparent anesthesia complications

## 2012-03-07 NOTE — H&P (View-Only) (Signed)
NAME:  [name], [name]               ACCOUNT NO.:  [id number]  MEDICAL RECORD NO.:  [id number]  LOCATION:  [facility]                         FACILITY:  [facility]  PHYSICIAN:  Laporsche Hoeger J. Jonovan Boedecker, P.A.DATE OF BIRTH:  05/28/1941  DATE OF ADMISSION:  02/29/2012 DATE OF DISCHARGE:  02/29/2012                             HISTORY & PHYSICAL   DATE OF SURGERY:  March 07, 2012.  ADMITTING DIAGNOSIS:  End-stage osteoarthritis, right hip.  HISTORY OF PRESENT ILLNESS:  This is a 70-year-old lady with a history of end-stage osteoarthritis of her right hip that has failed conservative treatment.  After discussion of treatments, benefits, risks, and options, the patient is now scheduled for total hip arthroplasty by anterior approach.  The patient's medical doctor is Dr. Brandt Burnett, her cardiologist is Dr. Berry, and her thoracic surgeon Dr. Gerhardt.  Note that she is not a candidate for trans-Cinnamic acid or dexamethasone, and we will not receive either at surgery.  She is planning on going home after surgery.  She is given her home medicines of aspirin, Robaxin, iron, MiraLAX, and Colace to take postoperatively.  PAST MEDICAL HISTORY:  Drug allergy to SULFA with a rash, PENICILLIN with a rash, and CLINDAMYCIN with nausea.  CURRENT MEDICATIONS:  Zyrtec 1 twice a day, Prozac 20 mg twice a day, Norvasc 5 mg once a day, Symbicort 2 puffs twice a day, and tramadol one every 8-12 hours p.r.n. for pain.  PREVIOUS SURGERIES:  Herniorrhaphy, rotator cuff repair, hysterectomy, left total hip arthroplasty, partial resection of right lung secondary to non-small cell lung cancer, tonsils, adenoids, and carpal tunnel release.  SERIOUS MEDICAL ILLNESSES:  Allergies, depression, hypertension, COPD, irritable bowel syndrome, history of lung cancer, gout, and arthritis.  FAMILY HISTORY:  Positive for arthritis.  SOCIAL HISTORY:  The patient is married.  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  She used to smoke, but does not any further and has 2 drinks per day, and again plans on going home after surgery.  REVIEW OF SYSTEMS:  CENTRAL NERVOUS SYSTEM:  Positive for history of migraine, headaches, but no more since menopause, positive for insomnia and tinnitus.  PULMONARY:  Positive for shortness of breath on exertion with history of lung cancer with partial resection of right lung. CARDIOVASCULAR:  Negative for chest pain or palpitation.  GI:  Positive for diarrhea with irritable bowel syndrome, negative for ulcers or hepatitis.  GU:  Negative for urinary tract difficulty. MUSCULOSKELETAL:  Positive as in HPI.  PHYSICAL EXAMINATION:  VITAL SIGNS:  BP 135/88, pulse 72 and regular, respirations 14. HEENT:  Head normocephalic.  Nose patent.  Ears patent.  Pupils equal, round, and reactive to light.  Throat without injection. NECK:  Supple without adenopathy.  Carotids 2+ without bruit. CHEST:  Clear to auscultation.  No rales or rhonchi.  Respirations are 14.  Note that there are slightly decreased sounds in the mid posterior right chest. HEART:  Regular rate and rhythm at 72 beats per minute without murmur. ABDOMEN:  Soft with active bowel sounds.  No masses or organomegaly. NEUROLOGIC:  The patient is alert and oriented to time, place, and person.    Cranial nerves II through XII grossly intact. EXTREMITIES:  Shows the right hip with 3 degrees from full extension, further flexion to 110, external rotation of 35, internal rotation of 15.  Neurovascular status intact.  IMPRESSION:  End-stage osteoarthritis, right hip.  PLAN:  Total hip arthroplasty, right hip.     Darragh Nay J. Brenn Deziel, P.A.     SJC/MEDQ  D:  03/01/2012  T:  03/02/2012  Job:  [id number] 

## 2012-03-07 NOTE — Transfer of Care (Signed)
Immediate Anesthesia Transfer of Care Note  Patient: [name]  Procedure(s) Performed: Procedure(s) (LRB): TOTAL HIP ARTHROPLASTY ANTERIOR APPROACH (Right)  Patient Location: PACU  Anesthesia Type: Regional  Level of Consciousness: awake, alert  and oriented  Airway & Oxygen Therapy: Patient Spontanous Breathing and Patient connected to face mask oxygen  Post-op Assessment: Report given to PACU RN and Post -op Vital signs reviewed and stable  Post vital signs: Reviewed and stable  Complications: No apparent anesthesia complications

## 2012-03-08 ENCOUNTER — Encounter (HOSPITAL_COMMUNITY): Payer: Self-pay | Admitting: Orthopedic Surgery

## 2012-03-08 LAB — CBC
HCT: 28.7 % — ABNORMAL LOW (ref 36.0–46.0)
Hemoglobin: 10.2 g/dL — ABNORMAL LOW (ref 12.0–15.0)
MCHC: 35.5 g/dL (ref 30.0–36.0)
RBC: 3.15 MIL/uL — ABNORMAL LOW (ref 3.87–5.11)
WBC: 9.1 10*3/uL (ref 4.0–10.5)

## 2012-03-08 LAB — BASIC METABOLIC PANEL
BUN: 10 mg/dL (ref 6–23)
CO2: 25 mEq/L (ref 19–32)
Chloride: 101 mEq/L (ref 96–112)
GFR calc non Af Amer: 90 mL/min — ABNORMAL LOW (ref >90)
Glucose, Bld: 116 mg/dL — ABNORMAL HIGH (ref 70–99)
Potassium: 3.8 mEq/L (ref 3.5–5.1)
Sodium: 133 mEq/L — ABNORMAL LOW (ref 135–145)

## 2012-03-08 MED ORDER — DSS 100 MG PO CAPS: 100.0000 mg | ORAL_CAPSULE | Freq: Two times a day (BID) | ORAL | Status: AC

## 2012-03-08 MED ORDER — FERROUS SULFATE 325 (65 FE) MG PO TABS: 325.0000 mg | ORAL_TABLET | Freq: Three times a day (TID) | ORAL | Status: DC

## 2012-03-08 MED ORDER — TRAMADOL HCL 50 MG PO TABS
50.0000 mg | ORAL_TABLET | Freq: Four times a day (QID) | ORAL | Status: DC
  Administered 2012-03-08 – 2012-03-09 (×3): 100 mg via ORAL
  Filled 2012-03-08 (×7): qty 2

## 2012-03-08 MED ORDER — METHOCARBAMOL 500 MG PO TABS: 500.0000 mg | ORAL_TABLET | Freq: Four times a day (QID) | ORAL | Status: AC | PRN

## 2012-03-08 MED ORDER — ASPIRIN EC 325 MG PO TBEC: 325.0000 mg | DELAYED_RELEASE_TABLET | Freq: Two times a day (BID) | ORAL | Status: AC

## 2012-03-08 MED ORDER — DOCUSATE SODIUM 100 MG PO CAPS
100.0000 mg | ORAL_CAPSULE | Freq: Two times a day (BID) | ORAL | Status: DC
  Administered 2012-03-08 – 2012-03-09 (×3): 100 mg via ORAL

## 2012-03-08 NOTE — Clinical Documentation Improvement (Signed)
Anemia Blood Loss Clarification  THIS DOCUMENT IS NOT A PERMANENT PART OF THE MEDICAL RECORD  RESPOND TO THE THIS QUERY, FOLLOW THE INSTRUCTIONS BELOW:  1. If needed, update documentation for the patient's encounter via the notes activity.  2. Access this query again and click edit on the In [employer].  3. After updating, or not, click F2 to complete all highlighted (required) fields concerning your review. Select "additional documentation in the medical record" OR "no additional documentation provided".  4. Click Sign note button.  5. The deficiency will fall out of your In Basket *Please let us know if you are not able to complete this workflow by phone or e-mail (listed below).        03/08/12  DearStephen Charlette Caffey PA or Associates  In an effort to better capture your patient's severity of illness, reflect appropriate length of stay and utilization of resources, a review of the patient medical record has revealed the following indicators.    Based on your clinical judgment, please clarify and document in a progress note and/or discharge summary the clinical condition associated with the following supporting information:  In responding to this query please exercise your independent judgment.  The fact that a query is asked, does not imply that any particular answer is desired or expected.  Pt with R THR. Marland Kitchen According to lab pt's post op  H/H-10.2/28.7.   Please clarify if post op H/H necessitating treatment of ferrous sulfate can be further specified as one of the diagnoses listed below and document in pn or d/c summary.  Thank you for your exceptional documentation!    Possible Clinical Conditions?   "  XXX Expected Acute Blood Loss Anemia  " Acute Blood Loss Anemia  " Acute on chronic blood loss anemia  " Other Condition________________  " Cannot Clinically Determine    Risk Factors: End stage OA, HTN, COPD, IBS, Lung ca, gout  Supporting  Information:  Signs and Symptoms Abnormal H/H  Diagnostics: EBL:400 Component     Latest Ref Rng 03/08/2012          Hemoglobin     12.0 - 15.0 g/dL 08.6 (L)  HCT     57.8 - 46.0 % 28.7 (L)     Treatments: IV fluids   sodium chloride 0.9 % 1,000 mL with potassium chloride 10 mEq infusion     Lactated ringers Serial H&H monitoring Medications  ferrous sulfate tablet 325 mg    Reviewed:  no additional documentation provided  Thank You,  Enis Slipper RN, BSN, MSN/Inf, CCDS Clinical Documentation Specialist [facility] Dept Pager: [phone] / E-mail: [email or web address]  Health Information Management [place]

## 2012-03-08 NOTE — Care Management Note (Signed)
    Page 1 of 2   03/09/2012     3:13:35 PM   CARE MANAGEMENT NOTE 03/09/2012  Patient:  [name], [name]   Account Number:  [id number]  Date Initiated:  03/08/2012  Documentation initiated by:  Colleen Can  Subjective/Objective Assessment:   dx rt hip osteoarthritis; hip arthroplasty-anterior approach     Action/Plan:   CM spoke with patient. Plans are for her to return to her home in [place] where spouse will be caregiver. States she already has RW, cane and potty chair. Wants HH agency that is in network.   Anticipated DC Date:  03/08/2012   Anticipated DC Plan:  HOME W HOME HEALTH SERVICES  In-house referral  Clinical Social Worker      DC Planning Services  CM consult      [facility] Choice  HOME HEALTH   Choice offered to / List presented to:  C-1 Patient   DME arranged  NA      DME agency  NA     HH arranged  HH-2 PT      [facility] agency  [facility] Care   Status of service:  Completed, signed off Medicare Important Message given?  NA - LOS <3 / Initial given by admissions (If response is "NO", the following Medicare IM given date fields will be blank) Date Medicare IM given:   Date Additional Medicare IM given:    Discharge Disposition:  HOME W HOME HEALTH SERVICES  Per UR Regulation:    If discussed at Long Length of Stay Meetings, dates discussed:    Comments:  03/09/2012 Raynelle Bring BSN CCM [phone] HHpt orders faxed to intake at [facility] with confirmation. HHpt services to start tomorrow 08/0-09/2011 .   03/08/2012 Raynelle Bring BSN CCM [phone] Spoke with Okey Regal at [facility] intake; faxed op note, face sheet, h&P to [phone]. confirmation received. HHPT orders will be faxed when orders written.CM will follow

## 2012-03-08 NOTE — Discharge Summary (Signed)
Physician Discharge Summary  Patient ID: [name] MRN: [id number] DOB/AGE: 05-01-1941 71 y.o.  Admit date: 03/07/2012 Discharge date:  03/09/2012   Procedures:  Procedure(s) (LRB): TOTAL HIP ARTHROPLASTY ANTERIOR APPROACH (Right)  Attending Physician:  Dr. Durene Romans   Admission Diagnoses:  End-stage osteoarthritis, right hip  Discharge Diagnoses:  Principal Problem:  *S/P right THA, AA Allergies Depression Hypertension COPD,  IBS History of lung cancer Gout Arthritis  HPI:  This is a 71 year old lady with a history of end-stage osteoarthritis of her right hip that has failed conservative treatment. After discussion of treatments, benefits, risks, and options, the patient is now scheduled for total hip  arthroplasty by anterior approach. The patient's medical doctor is Dr. Allen Norris, her cardiologist is Dr. Allyson Sabal, and her thoracic surgeon Dr. Tyrone Sage. Note that she is not a candidate for trans-Cinnamic acid or dexamethasone, and we will not receive either at surgery. She is planning on going home after surgery. She is given her home medicines of aspirin, Robaxin, iron, MiraLAX, and Colace to take postoperatively.  PCP: Henri Medal, MD   Discharged Condition: good  Hospital Course:  Patient underwent the above stated procedure on 03/07/2012. Patient tolerated the procedure well and brought to the recovery room in good condition and subsequently to the floor.  POD #1 BP: 121/76 ; Pulse: 86 ; Temp: 98.5 F (36.9 C) ; Resp: 14  Pt's foley was removed, as well as the hemovac drain removed. IV was changed to a saline lock. Patient reports pain as mild, pain well controlled. No events throughout the night. Little dizzy this morning after getting up for the first time, but doing okay after getting up.  Neurovascular intact, dorsiflexion/plantar flexion intact, incision: dressing C/D/I, no cellulitis present and compartment soft.  LABS  Basename  03/08/12 0353     HGB  10.2  HCT  28.7    POD #2  BP: 118/75 ; Pulse: 80 ; Temp: 98.2 F (36.8 C) ; Resp: 20  Patient reports pain as mild, pain well controlled. No events throughout the night. Ready to be discharged home. Neurovascular intact, dorsiflexion/plantar flexion intact, incision: dressing C/D/I, no cellulitis present and compartment soft.   LABS  Basename  03/09/12 0333   HGB  10.1  HCT  29.2     Discharge Exam: General appearance: alert, cooperative and no distress Extremities: Homans sign is negative, no sign of DVT, no edema, redness or tenderness in the calves or thighs and no ulcers, gangrene or trophic changes  Disposition:  Home or Self Care with follow up in 2 weeks   Follow-up Information    Follow up with [name], MD in 2 weeks.   Contact information:   [facility] [address], [address] [place] [address] [phone]          Discharge Orders    Future Appointments: Provider: Department: Dept Phone: Center:   05/10/2012 10:00 AM Windell Hummingbird [facility] [phone] None   05/10/2012 10:30 AM [facility] [phone] [place]   05/15/2012 11:00 AM Si Gaul, MD [facility] [phone] None   05/25/2012 11:00 AM [name], MD [facility] [place] [phone] [facility]     Future Orders Please Complete By Expires   Diet - low sodium heart healthy      Call MD / Call 911      Comments:   If you experience chest pain or shortness of breath, CALL 911 and be transported to the  hospital emergency room.  If you develope a fever above 101 F, pus (white drainage) or increased drainage or redness at the wound, or calf pain, call your surgeon's office.   Discharge instructions      Comments:   Maintain surgical dressing for 8 days, then replace with gauze and tape. Keep the area dry and clean until follow up. Follow up in 2 weeks at [facility]. Call with any questions or concerns.   Constipation  Prevention      Comments:   Drink plenty of fluids.  Prune juice may be helpful.  You may use a stool softener, such as Colace (over the counter) 100 mg twice a day.  Use MiraLax (over the counter) for constipation as needed.   Increase activity slowly as tolerated      Driving restrictions      Comments:   No driving for 4 weeks   Change dressing      Comments:   Maintain surgical dressing for 8 days, then replace with 4x4 guaze and tape. Keep the area dry and clean.   TED hose      Comments:   Use stockings (TED hose) for 2 weeks on both leg(s).  You may remove them at night for sleeping.       Current Discharge Medication List    START taking these medications   Details  aspirin EC 325 MG tablet Take 1 tablet (325 mg total) by mouth 2 (two) times daily. X 4 weeks Qty: 60 tablet, Refills: 0    docusate sodium 100 MG CAPS Take 100 mg by mouth 2 (two) times daily. Qty: 10 capsule    ferrous sulfate 325 (65 FE) MG tablet Take 1 tablet (325 mg total) by mouth 3 (three) times daily after meals.    HYDROcodone-acetaminophen (NORCO) 7.5-325 MG per tablet Take 1-2 tablets by mouth every 4 (four) hours. Qty: 120 tablet, Refills: 0    methocarbamol (ROBAXIN) 500 MG tablet Take 1 tablet (500 mg total) by mouth every 6 (six) hours as needed (muscle spasms).      CONTINUE these medications which have NOT CHANGED   Details  albuterol (PROVENTIL HFA;VENTOLIN HFA) 108 (90 BASE) MCG/ACT inhaler Inhale 2 puffs into the lungs every 6 (six) hours as needed. For wheezing    amLODipine (NORVASC) 5 MG tablet Take 5 mg by mouth at bedtime.    Associated Diagnoses: Macrocytosis without anemia    budesonide-formoterol (SYMBICORT) 160-4.5 MCG/ACT inhaler Inhale 2 puffs into the lungs 2 (two) times daily. Qty: 1 Inhaler, Refills: 12    cetirizine (ZYRTEC) 10 MG tablet Take 10 mg by mouth every morning.    FLUoxetine (PROZAC) 20 MG tablet Take 20 mg by mouth 2 (two) times daily.    Associated  Diagnoses: Macrocytosis without anemia    fluticasone (FLONASE) 50 MCG/ACT nasal spray Place 2 sprays into the nose daily as needed. For stuffy nose    calcium-vitamin D (OSCAL WITH D) 500-200 MG-UNIT per tablet Take 1 tablet by mouth 2 (two) times daily.    Glucosamine 500 MG CAPS Take 1 capsule by mouth 2 (two) times daily.    Associated Diagnoses: Macrocytosis without anemia    Multiple Vitamin (MULITIVITAMIN WITH MINERALS) TABS Take 1 tablet by mouth daily.      STOP taking these medications     traMADol (ULTRAM) 50 MG tablet Comments:  Reason for Stopping:       acetaminophen (TYLENOL) 650 MG CR tablet Comments:  Reason for  Stopping:          Signed:  Anastasio Auerbach. Cherryl Babin   PAC  03/09/2012, 7:40 AM

## 2012-03-08 NOTE — Progress Notes (Addendum)
  Subjective: 1 Day Post-Op Procedure(s) (LRB): TOTAL HIP ARTHROPLASTY ANTERIOR APPROACH (Right)   Patient reports pain as mild, pain well controlled. No events throughout the night. Little dizzy this morning after getting up for the first time, but doing okay after getting up.   Objective:   VITALS:   Filed Vitals:   03/08/12 0449  BP: 121/76  Pulse: 86  Temp: 98.5 F (36.9 C)  Resp: 14    Neurovascular intact Dorsiflexion/Plantar flexion intact Incision: dressing C/D/I No cellulitis present Compartment soft  LABS  Basename 03/08/12 0353  HGB 10.2*  HCT 28.7*  WBC 9.1  PLT 152     Basename 03/08/12 0353  NA 133*  K 3.8  BUN 10  CREATININE 0.64  GLUCOSE 116*     Assessment/Plan: 1 Day Post-Op Procedure(s) (LRB): TOTAL HIP ARTHROPLASTY ANTERIOR APPROACH (Right)   HV drain d/c'ed Foley cath d/c'ed Advance diet Up with therapy D/C IV fluids Would like to try Tramadol for pain management. Will order, but if not able to control her pain, may change back to the Norco. Colace ordered to prevent any potential constipation. Discharge home with home health tomorrow if continues to do well   Anastasio Auerbach. Cono Gebhard   PAC  03/08/2012, 10:48 AM

## 2012-03-08 NOTE — Evaluation (Signed)
Physical Therapy Evaluation Patient Details Name: [name] MRN: [id number] DOB: 1941-07-27 Today's Date: 03/08/2012 Time: 4098-1191 PT Time Calculation (min): 32 min  PT Assessment / Plan / Recommendation Clinical Impression  Pt with R THR presents with decreased R LE strength/ROM and limitations in functional mobililty    PT Assessment  Patient needs continued PT services    Follow Up Recommendations  Home health PT    Barriers to Discharge        Equipment Recommendations  None recommended by PT    Recommendations for Other Services OT consult   Frequency 7X/week    Precautions / Restrictions Precautions Precautions: None Restrictions Weight Bearing Restrictions: No Other Position/Activity Restrictions: WBAT   Pertinent Vitals/Pain 4/10; premedicated; ice pack provided     Mobility  Bed Mobility Bed Mobility: Supine to Sit Supine to Sit: 4: Min guard Details for Bed Mobility Assistance: cues to self assist with L LE Transfers Transfers: Sit to Stand;Stand to Sit Sit to Stand: 4: Min assist Stand to Sit: 4: Min assist Details for Transfer Assistance: cues for LE management and use of UEs to self assist Ambulation/Gait Ambulation/Gait Assistance: 4: Min assist;3: Mod assist Ambulation Distance (Feet): 5 Feet Assistive device: Rolling walker Ambulation/Gait Assistance Details: cues for sequence and position from RW, ltd by pt c/o increasing dizziness Gait Pattern: Step-to pattern    Exercises Total Joint Exercises Ankle Circles/Pumps: AROM;10 reps;Both;Supine Quad Sets: AROM;10 reps;Supine;Both Heel Slides: AAROM;Supine;15 reps;Right Hip ABduction/ADduction: AAROM;15 reps;Right;Supine   PT Diagnosis: Difficulty walking  PT Problem List: Decreased strength;Decreased range of motion;Decreased activity tolerance;Decreased balance;Decreased mobility;Decreased knowledge of use of DME;Pain PT Treatment Interventions: DME instruction;Gait training;Stair  training;Functional mobility training;Therapeutic activities;Therapeutic exercise;Patient/family education   PT Goals Acute Rehab PT Goals PT Goal Formulation: With patient Time For Goal Achievement: 03/14/12 Potential to Achieve Goals: Good Pt will go Supine/Side to Sit: with supervision PT Goal: Supine/Side to Sit - Progress: Goal set today Pt will go Sit to Supine/Side: with supervision PT Goal: Sit to Supine/Side - Progress: Goal set today Pt will go Sit to Stand: with supervision PT Goal: Sit to Stand - Progress: Goal set today Pt will go Stand to Sit: with supervision PT Goal: Stand to Sit - Progress: Goal set today Pt will Ambulate: 51 - 150 feet;with supervision;with rolling walker PT Goal: Ambulate - Progress: Goal set today Pt will Go Up / Down Stairs: 3-5 stairs;with min assist;with least restrictive assistive device PT Goal: Up/Down Stairs - Progress: Goal set today  Visit Information  Last PT Received On: 03/08/12 Assistance Needed: +1    Subjective Data  Subjective: I had my other hip done the other way and it took a long time to get better Patient Stated Goal: Home asap   Prior Functioning  Home Living Lives With: Spouse Available Help at Discharge: Family Type of Home: House Home Access: Stairs to enter Secretary/administrator of Steps: 3 Entrance Stairs-Rails: Left Home Layout: One level Home Adaptive Equipment: Walker - rolling Prior Function Level of Independence: Independent with assistive device(s) Able to Take Stairs?: Yes Driving: Yes Communication Communication: No difficulties    Cognition  Overall Cognitive Status: Appears within functional limits for tasks assessed/performed Arousal/Alertness: Awake/alert Orientation Level: Appears intact for tasks assessed Behavior During Session: [facility] for tasks performed    Extremity/Trunk Assessment Right Upper Extremity Assessment RUE ROM/Strength/Tone: [facility] for tasks assessed Left Upper Extremity  Assessment LUE ROM/Strength/Tone: [facility] for tasks assessed Right Lower Extremity Assessment RLE ROM/Strength/Tone: Deficits RLE ROM/Strength/Tone Deficits:  hip strength 2+/5 with hip flex to 90 and abd to 20 AAROM Left Lower Extremity Assessment LLE ROM/Strength/Tone: [facility] for tasks assessed   Balance    End of Session PT - End of Session Activity Tolerance: Other (comment) (ltd by c/o increasing dizziness) Patient left: in chair;with call bell/phone within reach Nurse Communication: Mobility status  GP     [name] 03/08/2012, 12:26 PM

## 2012-03-08 NOTE — Progress Notes (Signed)
Physical Therapy Treatment Patient Details Name: [name] MRN: [id number] DOB: 1940-08-27 Today's Date: 03/08/2012 Time: 8119-1478 PT Time Calculation (min): 17 min  PT Assessment / Plan / Recommendation Comments on Treatment Session       Follow Up Recommendations  Home health PT    Barriers to Discharge        Equipment Recommendations  None recommended by PT    Recommendations for Other Services OT consult  Frequency 7X/week   Plan Discharge plan remains appropriate    Precautions / Restrictions Precautions Precautions: None Restrictions Weight Bearing Restrictions: No Other Position/Activity Restrictions: WBAT   Pertinent Vitals/Pain     Mobility  Bed Mobility Bed Mobility: Supine to Sit;Sit to Supine Supine to Sit: 4: Min guard Sit to Supine: 4: Min guard Details for Bed Mobility Assistance: cues to self assist with L LE Transfers Transfers: Sit to Stand;Stand to Sit Sit to Stand: 4: Min assist Stand to Sit: 4: Min assist Details for Transfer Assistance: cues for LE management and use of UEs to self assist Ambulation/Gait Ambulation/Gait Assistance: 4: Min assist Ambulation Distance (Feet): 68 Feet Assistive device: Rolling walker Ambulation/Gait Assistance Details: cues for posture and position from RW; ltd by c/o increased dizziness Gait Pattern: Step-to pattern    Exercises     PT Diagnosis:    PT Problem List:   PT Treatment Interventions:     PT Goals Acute Rehab PT Goals PT Goal Formulation: With patient Time For Goal Achievement: 03/14/12 Potential to Achieve Goals: Good Pt will go Supine/Side to Sit: with supervision PT Goal: Supine/Side to Sit - Progress: Progressing toward goal Pt will go Sit to Supine/Side: with supervision PT Goal: Sit to Supine/Side - Progress: Progressing toward goal Pt will go Sit to Stand: with supervision PT Goal: Sit to Stand - Progress: Progressing toward goal Pt will go Stand to Sit: with  supervision PT Goal: Stand to Sit - Progress: Progressing toward goal Pt will Ambulate: 51 - 150 feet;with supervision;with rolling walker PT Goal: Ambulate - Progress: Progressing toward goal Pt will Go Up / Down Stairs: 3-5 stairs;with min assist;with least restrictive assistive device PT Goal: Up/Down Stairs - Progress: Goal set today  Visit Information  Last PT Received On: 03/08/12 Assistance Needed: +1    Subjective Data  Subjective: I'm feeling much better than this morning Patient Stated Goal: Home asap   Cognition  Overall Cognitive Status: Appears within functional limits for tasks assessed/performed Arousal/Alertness: Awake/alert Orientation Level: Appears intact for tasks assessed Behavior During Session: [facility] for tasks performed    Balance     End of Session PT - End of Session Activity Tolerance: Patient tolerated treatment well Patient left: in bed;with call bell/phone within reach Nurse Communication: Mobility status   GP     [name] 03/08/2012, 3:10 PM

## 2012-03-09 LAB — CBC
Hemoglobin: 10.1 g/dL — ABNORMAL LOW (ref 12.0–15.0)
Platelets: 152 10*3/uL (ref 150–400)
RBC: 3.15 MIL/uL — ABNORMAL LOW (ref 3.87–5.11)

## 2012-03-09 LAB — BASIC METABOLIC PANEL
CO2: 28 mEq/L (ref 19–32)
Calcium: 8.6 mg/dL (ref 8.4–10.5)
GFR calc non Af Amer: 88 mL/min — ABNORMAL LOW (ref >90)
Glucose, Bld: 103 mg/dL — ABNORMAL HIGH (ref 70–99)
Potassium: 4 mEq/L (ref 3.5–5.1)
Sodium: 132 mEq/L — ABNORMAL LOW (ref 135–145)

## 2012-03-09 MED ORDER — HYDROCODONE-ACETAMINOPHEN 7.5-325 MG PO TABS
1.0000 | ORAL_TABLET | ORAL | Status: AC
Start: 2012-03-09 — End: 2012-03-19

## 2012-03-09 NOTE — Progress Notes (Signed)
Discharged to family auto via w/c. Assessment unchanged from am. 

## 2012-03-09 NOTE — Evaluation (Signed)
Occupational Therapy Evaluation Patient Details Name: [name] MRN: [id number] DOB: 03/01/1941 Today's Date: 03/09/2012 Time: 4098-1191 OT Time Calculation (min): 21 min  OT Assessment / Plan / Recommendation Clinical Impression  This 71 year old female was admitted for Anterior direct approach R THA.  She has DME at home and all education was completed.  She will not need further OT at this time.      OT Assessment  Patient does not need any further OT services    Follow Up Recommendations       Barriers to Discharge      Equipment Recommendations  None recommended by OT;None recommended by PT. Pt has 3:1 and high commode    Recommendations for Other Services    Frequency       Precautions / Restrictions Precautions Precautions: None Restrictions Weight Bearing Restrictions: No Other Position/Activity Restrictions: WBAT   Pertinent Vitals/Pain 2 in bed; 5 standing    ADL  Grooming: Performed;Supervision/safety;Teeth care Where Assessed - Grooming: Supported standing Upper Body Bathing: Simulated;Set up Where Assessed - Upper Body Bathing: Unsupported sitting Lower Body Bathing: Simulated;Minimal assistance Where Assessed - Lower Body Bathing: Supported sit to stand Upper Body Dressing: Simulated;Set up Where Assessed - Upper Body Dressing: Unsupported sitting Lower Body Dressing: Simulated;Minimal assistance Where Assessed - Lower Body Dressing: Supported sit to stand Toilet Transfer: Performed;Min guard Statistician Method: Sit to Barista: Comfort height toilet Toileting - Architect and Hygiene: Supervision/safety;Performed Where Assessed - Toileting Clothing Manipulation and Hygiene: Sit to stand from 3-in-1 or toilet Tub/Shower Transfer: Performed;Min guard Tub/Shower Transfer Method: Science writer: Walk in shower Equipment Used: Rolling walker Transfers/Ambulation Related to ADLs: min  guard ambulating ADL Comments: has long sponge/brush at home.  Will have help for adls    OT Diagnosis:    OT Problem List:   OT Treatment Interventions:     OT Goals    Visit Information  Last OT Received On: 03/09/12 Assistance Needed: +1    Subjective Data  Subjective: "I'm hot.  I can stand to brush my teeth" Patient Stated Goal: none stated.  Agreeable to OT   Prior Functioning  Vision/Perception  Home Living Lives With: Spouse Available Help at Discharge: Family Bathroom Shower/Tub: Walk-in shower Bathroom Toilet: Handicapped height Home Adaptive Equipment: Walker - rolling;Shower chair without back Prior Function Level of Independence: Independent with assistive device(s) Communication Communication: No difficulties Dominant Hand: Right      Cognition  Overall Cognitive Status: Appears within functional limits for tasks assessed/performed Arousal/Alertness: Awake/alert Behavior During Session: [facility] for tasks performed    Extremity/Trunk Assessment Right Upper Extremity Assessment RUE ROM/Strength/Tone: [facility] for tasks assessed Left Upper Extremity Assessment LUE ROM/Strength/Tone: WFL for tasks assessed   Mobility Bed Mobility Supine to Sit: 5: Supervision Transfers Sit to Stand: 5: Supervision   Exercise    Balance    End of Session OT - End of Session Activity Tolerance: Other (comment) (nausea/dry heaves) Patient left: in chair;with call bell/phone within reach Nurse Communication: Other (comment) (medication)  GO     Lashandra Arauz 03/09/2012, 11:11 AM Marica Otter, OTR/L [phone] 03/09/2012

## 2012-03-09 NOTE — Progress Notes (Signed)
  Subjective: 2 Days Post-Op Procedure(s) (LRB): TOTAL HIP ARTHROPLASTY ANTERIOR APPROACH (Right)   Patient reports pain as mild, pain well controlled. No events throughout the night. Ready to be discharged home.  Objective:   VITALS:   Filed Vitals:   03/09/12 0554  BP: 118/75  Pulse: 80  Temp: 98.2 F (36.8 C)  Resp: 20    Neurovascular intact Dorsiflexion/Plantar flexion intact Incision: dressing C/D/I No cellulitis present Compartment soft  LABS  Basename 03/09/12 0333 03/08/12 0353  HGB 10.1* 10.2*  HCT 29.2* 28.7*  WBC 9.2 9.1  PLT 152 152     Basename 03/09/12 0333 03/08/12 0353  NA 132* 133*  K 4.0 3.8  BUN 8 10  CREATININE 0.67 0.64  GLUCOSE 103* 116*     Assessment/Plan: 2 Days Post-Op Procedure(s) (LRB): TOTAL HIP ARTHROPLASTY ANTERIOR APPROACH (Right)   Up with therapy Discharge home with home health Follow up in 2 weeks at [facility].  Follow-up Information    Follow up with OLIN,Kiyanna Biegler D in 2 weeks.   Contact information:   [facility] [address] [place] [address] [phone]           Anastasio Auerbach. Drexel Ivey   PAC  03/09/2012, 7:38 AM

## 2012-03-09 NOTE — Progress Notes (Signed)
CSW consulted for SNF placement. PN reviewed. Pt plans to return home with [facility] services. RNCM will assist with d/c planning needs.  Cori Razor LCSW [phone]

## 2012-03-09 NOTE — Progress Notes (Signed)
Physical Therapy Treatment Patient Details Name: TAMMALA WEIDER MRN: [id number] DOB: 08/09/1941 Today's Date: 03/09/2012 Time: 1191-4782 PT Time Calculation (min): 34 min  PT Assessment / Plan / Recommendation Comments on Treatment Session       Follow Up Recommendations  Home health PT    Barriers to Discharge        Equipment Recommendations  None recommended by OT;None recommended by PT    Recommendations for Other Services OT consult  Frequency 7X/week   Plan Discharge plan remains appropriate    Precautions / Restrictions Precautions Precautions: None Restrictions Weight Bearing Restrictions: No Other Position/Activity Restrictions: WBAT   Pertinent Vitals/Pain 3/10; premedicated, ice pack provided    Mobility  Bed Mobility Supine to Sit: 5: Supervision Transfers Transfers: Sit to Stand;Stand to Sit Sit to Stand: 5: Supervision Stand to Sit: 4: Min guard Details for Transfer Assistance: cues for LE management and use of UEs to self assist Ambulation/Gait Ambulation/Gait Assistance: 4: Min guard Ambulation Distance (Feet): 150 Feet Assistive device: Rolling walker Ambulation/Gait Assistance Details: cues for posture and position from RW Gait Pattern: Step-to pattern;Step-through pattern Stairs: Yes Stairs Assistance: 4: Min assist Stairs Assistance Details (indicate cue type and reason): cues for sequence and foot placement Stair Management Technique: One rail Left;Step to pattern;Forwards Number of Stairs: 3  (x2)    Exercises Total Joint Exercises Ankle Circles/Pumps: AROM;20 reps;Both;Supine Quad Sets: AROM;15 reps;Supine;Both Heel Slides: AAROM;Supine;15 reps;Right Hip ABduction/ADduction: AAROM;15 reps;Right;Supine   PT Diagnosis:    PT Problem List:   PT Treatment Interventions:     PT Goals Acute Rehab PT Goals PT Goal Formulation: With patient Time For Goal Achievement: 03/14/12 Potential to Achieve Goals: Good Pt will go Supine/Side to  Sit: with supervision PT Goal: Supine/Side to Sit - Progress: Progressing toward goal Pt will go Sit to Supine/Side: with supervision PT Goal: Sit to Supine/Side - Progress: Progressing toward goal Pt will go Sit to Stand: with supervision PT Goal: Sit to Stand - Progress: Progressing toward goal Pt will go Stand to Sit: with supervision PT Goal: Stand to Sit - Progress: Progressing toward goal Pt will Ambulate: 51 - 150 feet;with supervision;with rolling walker PT Goal: Ambulate - Progress: Progressing toward goal Pt will Go Up / Down Stairs: 3-5 stairs;with min assist;with least restrictive assistive device PT Goal: Up/Down Stairs - Progress: Met  Visit Information  Last PT Received On: 03/09/12 Assistance Needed: +1    Subjective Data  Subjective: No new complaints` Patient Stated Goal: Home asap   Cognition  Overall Cognitive Status: Appears within functional limits for tasks assessed/performed Arousal/Alertness: Awake/alert Orientation Level: Appears intact for tasks assessed Behavior During Session: [facility] for tasks performed    Balance     End of Session PT - End of Session Activity Tolerance: Patient tolerated treatment well Patient left: in chair;with call bell/phone within reach Nurse Communication: Mobility status   GP     Mckynlie Vanderslice 03/09/2012, 12:40 PM

## 2012-05-10 ENCOUNTER — Other Ambulatory Visit (HOSPITAL_BASED_OUTPATIENT_CLINIC_OR_DEPARTMENT_OTHER): Payer: Medicare Other | Admitting: Lab

## 2012-05-10 ENCOUNTER — Ambulatory Visit (HOSPITAL_COMMUNITY)
Admission: RE | Admit: 2012-05-10 | Discharge: 2012-05-10 | Disposition: A | Discharge status: Home / Self Care | Payer: Medicare Other | Source: Ambulatory Visit | Attending: Internal Medicine | Admitting: Internal Medicine

## 2012-05-10 DIAGNOSIS — C3490 Malignant neoplasm of unspecified part of unspecified bronchus or lung: Secondary | ICD-10-CM

## 2012-05-10 DIAGNOSIS — I2510 Atherosclerotic heart disease of native coronary artery without angina pectoris: Secondary | ICD-10-CM | POA: Insufficient documentation

## 2012-05-10 DIAGNOSIS — J438 Other emphysema: Secondary | ICD-10-CM | POA: Insufficient documentation

## 2012-05-10 LAB — CBC WITH DIFFERENTIAL/PLATELET
BASO%: 1.9 % (ref 0.0–2.0)
EOS%: 2.3 % (ref 0.0–7.0)
HGB: 14.2 g/dL (ref 11.6–15.9)
MCH: 32.8 pg (ref 25.1–34.0)
MCHC: 34.3 g/dL (ref 31.5–36.0)
MCV: 95.7 fL (ref 79.5–101.0)
MONO%: 7.6 % (ref 0.0–14.0)
RBC: 4.32 10*6/uL (ref 3.70–5.45)
RDW: 13 % (ref 11.2–14.5)
lymph#: 1.3 10*3/uL (ref 0.9–3.3)

## 2012-05-10 LAB — COMPREHENSIVE METABOLIC PANEL (CC13)
AST: 19 U/L (ref 5–34)
Albumin: 4 g/dL (ref 3.5–5.0)
Alkaline Phosphatase: 60 U/L (ref 40–150)
BUN: 10 mg/dL (ref 7.0–26.0)
Calcium: 9.3 mg/dL (ref 8.4–10.4)
Chloride: 102 mEq/L (ref 98–107)
Glucose: 87 mg/dl (ref 70–99)
Potassium: 4.2 mEq/L (ref 3.5–5.1)
Sodium: 137 mEq/L (ref 136–145)
Total Protein: 6.9 g/dL (ref 6.4–8.3)

## 2012-05-10 MED ORDER — IOHEXOL 300 MG/ML  SOLN
80.0000 mL | Freq: Once | INTRAMUSCULAR | Status: AC | PRN
  Administered 2012-05-10: 80 mL via INTRAVENOUS

## 2012-05-15 ENCOUNTER — Telehealth: Payer: Self-pay | Admitting: Internal Medicine

## 2012-05-15 ENCOUNTER — Ambulatory Visit (HOSPITAL_BASED_OUTPATIENT_CLINIC_OR_DEPARTMENT_OTHER): Payer: Medicare Other | Admitting: Internal Medicine

## 2012-05-15 VITALS — BP 138/83 | HR 68 | Temp 96.9°F | Resp 20 | Ht 62.0 in | Wt 136.1 lb

## 2012-05-15 DIAGNOSIS — C3490 Malignant neoplasm of unspecified part of unspecified bronchus or lung: Secondary | ICD-10-CM

## 2012-05-15 DIAGNOSIS — C3410 Malignant neoplasm of upper lobe, unspecified bronchus or lung: Secondary | ICD-10-CM

## 2012-05-15 NOTE — Telephone Encounter (Signed)
appts made and printed for pt aom °

## 2012-05-15 NOTE — Progress Notes (Signed)
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  PRIOR THERAPY: Status post wedge resection of the right upper lobe lung nodule under the care of Dr. Dorris Fetch on 10/20/2011.  CURRENT THERAPY: Observation.  INTERVAL HISTORY: [name] 70 y.o. female returns to the clinic today for routine six-month followup visit. The patient is feeling fine with no specific complaints except for dry cough. She denied having any significant chest pain, shortness breath, or hemoptysis. She has no significant weight loss or night sweats. The patient denied having any significant nausea or vomiting. She has repeat CT scan of the chest performed recently and she is here today for evaluation and discussion of her scan results.  MEDICAL HISTORY: Past Medical History  Diagnosis Date  . COPD (chronic obstructive pulmonary disease)   . Depression   . Anxiety   . Gout     last time 6-72yrs ago   . Migraine   . OCD (obsessive compulsive disorder)   . Tinea corporis   . Acute bronchitis   . Allergic rhinitis   . Gout   . Macrocytosis   . Skin rash   . Anemia   . PONV (postoperative nausea and vomiting)     hard to wake  . Hypertension     takes Amlodipine nightly  . Hyperlipidemia     taking Flax Seed Oil and Fish Oil  . Shortness of breath     with exertion  . Emphysema   . Pneumonia     x 2 ;last time back in 1999  . Hx of migraines 90's    after menopause HA stopped  . Arthritis     all over  . Joint pain   . Joint swelling   . Osteopenia     takes Calcium and Vit D bid  . Back pain   . Dry skin   . Liver cyst   . IBS (irritable bowel syndrome)   . Diarrhea   . Blood transfusion     at age 9  .  Bilateral cataracts   . Hot flashes     takes Prozac daily  . Lung nodule     right  . Lung cancer   . Bruises easily   . Gallstones     ALLERGIES:  is allergic to clindamycin/lincomycin; penicillins; and sulfa antibiotics.  MEDICATIONS:  Current Outpatient Prescriptions  Medication Sig Dispense Refill  . albuterol (PROVENTIL HFA;VENTOLIN HFA) 108 (90 BASE) MCG/ACT inhaler Inhale 2 puffs into the lungs every 6 (six) hours as needed. For wheezing      . amLODipine (NORVASC) 5 MG tablet Take 5 mg by mouth at bedtime.       . budesonide-formoterol (SYMBICORT) 160-4.5 MCG/ACT inhaler Inhale 2 puffs into the lungs 2 (two) times daily.  1 Inhaler  12  . calcium-vitamin D (OSCAL WITH D) 500-200 MG-UNIT per tablet Take 1 tablet by mouth 2 (two) times daily.      . cetirizine (ZYRTEC) 10 MG tablet Take 10 mg by mouth every morning.      . ferrous sulfate 325 (65 FE) MG tablet Take 1 tablet (325 mg total) by mouth 3 (three) times daily after meals.      Marland Kitchen FLUoxetine (PROZAC)  20 MG tablet Take 20 mg by mouth 2 (two) times daily.       . fluticasone (FLONASE) 50 MCG/ACT nasal spray Place 2 sprays into the nose daily as needed. For stuffy nose      . Glucosamine 500 MG CAPS Take 1 capsule by mouth 2 (two) times daily.       . Multiple Vitamin (MULITIVITAMIN WITH MINERALS) TABS Take 1 tablet by mouth daily.      Marland Kitchen DISCONTD: Calcium Carbonate-Vitamin D (CALCIUM + D PO) Take by mouth daily. 500+400       . DISCONTD: diphenhydrAMINE (BENADRYL) 50 MG tablet Take 50 mg by mouth as needed.          SURGICAL HISTORY:  Past Surgical History  Procedure Date  . Inguinal hernia repair 2000  . Neuroplasty decompression median nerve at carpal tunnel   . Rotator cuff repair 2001    right  . Tonsillectomy   . Tonsillectomy as a child    and adenoids  . Dilation and curettage of uterus     at age 55  . Cyst removed >34yrs ago    from left leg  . Carpal tunnel release 80's    right   . Total abdominal  hysterectomy 2003  . Total hip arthroplasty 2011    left  . Colonoscopy   . Video bronchoscopy 10/20/2011    Procedure: VIDEO BRONCHOSCOPY;  Surgeon: [name], MD;  Location: [facility] OR;  Service: Thoracic;  Laterality: N/A;  . Wedge resection 10-20-2011    rt lung  - for lung cancer  . Total hip arthroplasty 03/07/2012    Procedure: TOTAL HIP ARTHROPLASTY ANTERIOR APPROACH;  Surgeon: [name], MD;  Location: [facility] ORS;  Service: Orthopedics;  Laterality: Right;    REVIEW OF SYSTEMS:  A comprehensive review of systems was negative except for: Respiratory: positive for cough and dyspnea on exertion   PHYSICAL EXAMINATION: General appearance: alert, cooperative and no distress Head: Normocephalic, without obvious abnormality, atraumatic Neck: no adenopathy Lymph nodes: Cervical, supraclavicular, and axillary nodes normal. Resp: clear to auscultation bilaterally Cardio: regular rate and rhythm, S1, S2 normal, no murmur, click, rub or gallop GI: soft, non-tender; bowel sounds normal; no masses,  no organomegaly Extremities: extremities normal, atraumatic, no cyanosis or edema  ECOG PERFORMANCE STATUS: 1 - Symptomatic but completely ambulatory  There were no vitals taken for this visit.  LABORATORY DATA: Lab Results  Component Value Date   WBC 5.0 05/10/2012   HGB 14.2 05/10/2012   HCT 41.3 05/10/2012   MCV 95.7 05/10/2012   PLT 178 05/10/2012      Chemistry      Component Value Date/Time   NA 137 05/10/2012 1015   NA 132* 03/09/2012 0333   K 4.2 05/10/2012 1015   K 4.0 03/09/2012 0333   CL 102 05/10/2012 1015   CL 99 03/09/2012 0333   CO2 25 05/10/2012 1015   CO2 28 03/09/2012 0333   BUN 10.0 05/10/2012 1015   BUN 8 03/09/2012 0333   CREATININE 0.8 05/10/2012 1015   CREATININE 0.67 03/09/2012 0333      Component Value Date/Time   CALCIUM 9.3 05/10/2012 1015   CALCIUM 8.6 03/09/2012 0333   ALKPHOS 60 05/10/2012 1015   ALKPHOS 44 10/22/2011 0335   AST 19 05/10/2012 1015   AST 20 10/22/2011  0335   ALT 16 05/10/2012 1015   ALT 14 10/22/2011 0335   BILITOT 0.70 05/10/2012 1015   BILITOT 0.5  10/22/2011 0335       RADIOGRAPHIC STUDIES: Ct Chest W Contrast  05/10/2012  *RADIOLOGY REPORT*  Clinical Data: Lung cancer.  CT CHEST WITH CONTRAST  Technique:  Multidetector CT imaging of the chest was performed following the standard protocol during bolus administration of intravenous contrast.  Contrast: 80mL OMNIPAQUE IOHEXOL 300 MG/ML  SOLN  Comparison: 09/13/2011  Findings:  Mediastinum:  No mediastinal or hilar adenopathy noted.  No pericardial or pleural effusion identified.  Prominent calcifications within the LAD and left circumflex coronary arteries noted. No pericardial effusion identified.  Lungs/Pleura: No pericardial effusion.  Moderate to marked changes of centrilobular emphysema identified.  Postop change within the right upper lobe is identified compatible with a wedge resection of the right upper lobe lung cancer.  No specific features identified to suggest residual or recurrence of tumor.  Upper Abdomen:  Limited imaging through the upper abdomen shows a large stone within the lumen of the gallbladder which measures 2 cm, image 69.  Changes are very polycystic liver are identified. Rim calcified lesion within the segment four of the liver is stable from previous exam.  Musculoskeletal:  Review of the visualized bony structures is significant for thoracic spondylosis.  Prior healed right lateral rib fractures are likely postsurgical, image 23 and 28.  IMPRESSION:  1.  Postop change from wedge resection of the right upper lobe lesion.  There are no specific features to suggest residual or recurrence of tumor. 2.  Centrilobular emphysema 3.  Coronary artery calcifications.   Original Report Authenticated By: [name], M.D.     ASSESSMENT: This is a very pleasant 71 years old white female with history of stage IA non-small cell lung cancer status post wedge resection of the right  upper lobe lung nodule in March of 2013 and has been observation with no evidence for disease recurrence.  PLAN: I discussed the scan results with the patient. I recommended for her to continue on observation for now with repeat CT scan of the chest with contrast in 6 months.  The patient was advised to call me immediately if she has any concerning symptoms in the interval.  All questions were answered. The patient knows to call the clinic with any problems, questions or concerns. We can certainly see the patient much sooner if necessary.

## 2012-05-15 NOTE — Patient Instructions (Signed)
CT scan of the chest showed no evidence for disease recurrence with the following impression: IMPRESSION:  1. Postop change from wedge resection of the right upper lobe  lesion. There are no specific features to suggest residual or  recurrence of tumor.  2. Centrilobular emphysema  3. Coronary artery calcifications.  Followup in 6 months with repeat CT scan of the chest.

## 2012-05-25 ENCOUNTER — Encounter: Payer: Self-pay | Admitting: Cardiothoracic Surgery

## 2012-05-25 ENCOUNTER — Ambulatory Visit (INDEPENDENT_AMBULATORY_CARE_PROVIDER_SITE_OTHER): Payer: Medicare Other | Admitting: Cardiothoracic Surgery

## 2012-05-25 VITALS — BP 128/89 | HR 76 | Resp 16 | Ht 62.0 in | Wt 136.0 lb

## 2012-05-25 DIAGNOSIS — C3410 Malignant neoplasm of upper lobe, unspecified bronchus or lung: Secondary | ICD-10-CM

## 2012-05-25 DIAGNOSIS — Z09 Encounter for follow-up examination after completed treatment for conditions other than malignant neoplasm: Secondary | ICD-10-CM

## 2012-05-25 NOTE — Patient Instructions (Signed)
Doing well post op Return in 6 months

## 2012-05-25 NOTE — Progress Notes (Signed)
[address]            [place] [address]          [phone]        [name] [facility] Medical Record #[id number] Date of Birth: 1940-11-20  Angela Shan, MD [name], MD   Chief Complaint:   PostOp Follow Up Visit 10/20/2011    OPERATIVE REPORT  PREOPERATIVE DIAGNOSIS: Right upper lobe 2-cm lung nodule, suspicious  for carcinoma.  POSTOPERATIVE DIAGNOSIS: Non-small-cell lung cancer.  PROCEDURE PERFORMED: Video bronchoscopy, right video-assisted  thoracoscopy with wedge and mini thoracotomy with wedge resection of  right upper lobe lung nodule and mediastinal lymph node dissection.  SURGEON: [name], MD     History of Present Illness:     Patient doing well postoperatively, she has not noticed much change in her pulmonary reserve. Some minor cough early in the morning. She is returned to near normal activities. Has had some cough, will discuss reflux with primary md  Recovered well from right hip replacement since lung resection         History  Smoking status  . Former Smoker -- 0.5 packs/day for 50 years  . Types: Cigarettes  . Quit date: 09/23/2011  Smokeless tobacco  . Never Used  Comment: quit smoking about a month ago       Allergies  Allergen Reactions  . Clindamycin/Lincomycin Nausea And Vomiting  . Penicillins Rash       . Sulfa Antibiotics Rash    Current Outpatient Prescriptions  Medication Sig Dispense Refill  . acetaminophen (TYLENOL) 325 MG tablet Take 650 mg by mouth every 12 (twelve) hours as needed. Takes 2 tabs every 12 hours      . albuterol (PROVENTIL HFA;VENTOLIN HFA) 108 (90 BASE) MCG/ACT inhaler Inhale 2 puffs into the lungs every 6 (six) hours as needed. For wheezing       . amLODipine (NORVASC) 5 MG tablet Take 2.5 mg by mouth at bedtime.       . budesonide-formoterol (SYMBICORT) 160-4.5 MCG/ACT inhaler Inhale 2 puffs into the lungs 2  (two) times daily.  1 Inhaler  12  . calcium-vitamin D (OSCAL WITH D) 500-200 MG-UNIT per tablet Take 1 tablet by mouth daily.       . diphenhydrAMINE (BENADRYL) 25 MG tablet Take 25 mg by mouth every 6 (six) hours as needed.      Marland Kitchen FLUoxetine (PROZAC) 20 MG tablet Take 20 mg by mouth 2 (two) times daily.       . fluticasone (FLONASE) 50 MCG/ACT nasal spray Place 2 sprays into the nose daily as needed. For stuffy nose       . Multiple Vitamin (MULITIVITAMIN WITH MINERALS) TABS Take 1 tablet by mouth daily.      Marland Kitchen DISCONTD: Calcium Carbonate-Vitamin D (CALCIUM + D PO) Take by mouth daily. 500+400            Physical Exam: BP 128/89  Pulse 76  Resp 16  Ht 5\' 2"  (1.575 m)  Wt 136 lb (61.689 kg)  BMI 24.87 kg/m2  SpO2 97%  General appearance: alert and cooperative Heart: regular rate and rhythm, S1, S2 normal, no murmur, click, rub or gallop and normal apical impulse Lungs: clear to auscultation bilaterally and normal  percussion bilaterally Wound: The chest tube site and small access incision on the right are well-healed  Diagnostic Studies & Laboratory data:         Recent Radiology Findings: Ct Chest W Contrast  05/10/2012  *RADIOLOGY REPORT*  Clinical Data: Lung cancer.  CT CHEST WITH CONTRAST  Technique:  Multidetector CT imaging of the chest was performed following the standard protocol during bolus administration of intravenous contrast.  Contrast: 80mL OMNIPAQUE IOHEXOL 300 MG/ML  SOLN  Comparison: 09/13/2011  Findings:  Mediastinum:  No mediastinal or hilar adenopathy noted.  No pericardial or pleural effusion identified.  Prominent calcifications within the LAD and left circumflex coronary arteries noted. No pericardial effusion identified.  Lungs/Pleura: No pericardial effusion.  Moderate to marked changes of centrilobular emphysema identified.  Postop change within the right upper lobe is identified compatible with a wedge resection of the right upper lobe lung cancer.  No  specific features identified to suggest residual or recurrence of tumor.  Upper Abdomen:  Limited imaging through the upper abdomen shows a large stone within the lumen of the gallbladder which measures 2 cm, image 69.  Changes are very polycystic liver are identified. Rim calcified lesion within the segment four of the liver is stable from previous exam.  Musculoskeletal:  Review of the visualized bony structures is significant for thoracic spondylosis.  Prior healed right lateral rib fractures are likely postsurgical, image 23 and 28.  IMPRESSION:  1.  Postop change from wedge resection of the right upper lobe lesion.  There are no specific features to suggest residual or recurrence of tumor. 2.  Centrilobular emphysema 3.  Coronary artery calcifications.   Original Report Authenticated By: [name], M.D.      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 Arbutus Ped.      [name] MD 05/25/2012 11:56 AM

## 2012-06-09 ENCOUNTER — Other Ambulatory Visit (HOSPITAL_COMMUNITY): Payer: Self-pay | Admitting: Family Medicine

## 2012-06-09 DIAGNOSIS — Z1231 Encounter for screening mammogram for malignant neoplasm of breast: Secondary | ICD-10-CM

## 2012-06-22 ENCOUNTER — Encounter: Payer: Self-pay | Admitting: Family Medicine

## 2012-06-22 ENCOUNTER — Ambulatory Visit (INDEPENDENT_AMBULATORY_CARE_PROVIDER_SITE_OTHER): Payer: Medicare Other | Admitting: Family Medicine

## 2012-06-22 VITALS — BP 124/84 | HR 79 | Temp 98.1°F | Ht 62.0 in | Wt 140.0 lb

## 2012-06-22 DIAGNOSIS — Z23 Encounter for immunization: Secondary | ICD-10-CM

## 2012-06-22 DIAGNOSIS — R7309 Other abnormal glucose: Secondary | ICD-10-CM

## 2012-06-22 DIAGNOSIS — F32A Depression, unspecified: Secondary | ICD-10-CM | POA: Insufficient documentation

## 2012-06-22 DIAGNOSIS — R21 Rash and other nonspecific skin eruption: Secondary | ICD-10-CM

## 2012-06-22 DIAGNOSIS — M899 Disorder of bone, unspecified: Secondary | ICD-10-CM

## 2012-06-22 DIAGNOSIS — F329 Major depressive disorder, single episode, unspecified: Secondary | ICD-10-CM

## 2012-06-22 DIAGNOSIS — M8580 Other specified disorders of bone density and structure, unspecified site: Secondary | ICD-10-CM

## 2012-06-22 DIAGNOSIS — E785 Hyperlipidemia, unspecified: Secondary | ICD-10-CM

## 2012-06-22 DIAGNOSIS — M949 Disorder of cartilage, unspecified: Secondary | ICD-10-CM

## 2012-06-22 DIAGNOSIS — R739 Hyperglycemia, unspecified: Secondary | ICD-10-CM

## 2012-06-22 DIAGNOSIS — I10 Essential (primary) hypertension: Secondary | ICD-10-CM

## 2012-06-22 DIAGNOSIS — F3289 Other specified depressive episodes: Secondary | ICD-10-CM

## 2012-06-22 LAB — LDL CHOLESTEROL, DIRECT: Direct LDL: 126.2 mg/dL

## 2012-06-22 LAB — LIPID PANEL: Cholesterol: 222 mg/dL — ABNORMAL HIGH (ref 0–200)

## 2012-06-22 NOTE — Patient Instructions (Addendum)
-  We have ordered labs or studies at this visit. It can take up to 1-2 weeks for results and processing. We will contact you with instructions IF your results are abnormal. Normal results will be released to your [facility]. If you have not heard from [place] or can not find your results in [facility] in 2 weeks please contact our office.  -on rash on leg - use eucerin or cerave cream daily and use your steroid cream daily for 2 weeks.  -PLEASE SIGN UP FOR MYCHART TODAY   We recommend the following healthy lifestyle measures: - eat a healthy diet consisting of lots of vegetables, fruits, beans, nuts, seeds, healthy meats such as white chicken and fish and whole grains.  - avoid fried foods, fast food, processed foods, sodas, red meet and other fattening foods.  - get a least 150 minutes of aerobic exercise per week.   Follow up in: 1-2 months

## 2012-06-22 NOTE — Progress Notes (Signed)
Chief Complaint  Patient presents with  . Establish Care    Medicare    HPI: [name] is here to establish care. Prior PCP left country. Had physcial exam with last office on Nov 6th, 2013. Getting mammogram. Hx of hysterectomy. Had colonoscopy 4 years ago and told needs next colonoscopy in 10 years. Had bone density 2009 and normal but hx of osteopenia. Told needed another in 2 years. No labs at recent physical. Wants labs today and wants to check thyroid because she has always been cold.  Has the following chronic continuing problems and concerns today:  HTN: -stable on amlodipine -swims twice per week, walks the dog  Rash LLE: -x1 year -itchy -has seen derm twice, given steroid - but did not use due to concern for adverse effects  Hx. Lung cancer s/p resection: -see below  Smoking: -resolved, pt quit smoking -Feb 2013  Depression/Anxiety/OCD/Hot flashes: -stable on prozac -chronic mild hyponatremia  Other Providers: -Dr. Tyrone Sage: Cardiothoracic Surgery - s/p wedge resection RUL nodule 10/2011 -Dr. Arbutus Ped: oncology - Stage 1A (Tb, N0, M0) NSCLC dx in 10/2011, last OV 05/2012, q64m follow up -Dr. Charlann Boxer: ortho, s/p THA 02/2012 -Dr. Marchelle Gearing: pulm, COPD, lung cancer, SOB, tobacco use. Last OV 12/2011, q3m f/u  Vaccines: had flu this year, had pneumovax, had shingles vaccine, had tetanus in 2007. Has not had tdap and wants this.  ROS: See pertinent positives and negatives per HPI.  Past Medical History  Diagnosis Date  . COPD (chronic obstructive pulmonary disease)   . Depression   . Anxiety   . Gout     last time 6-19yrs ago   . Migraine   . OCD (obsessive compulsive disorder)   . Tinea corporis   . Acute bronchitis   . Allergic rhinitis   . Gout   . Macrocytosis   . Skin rash   . PONV (postoperative nausea and vomiting)     hard to wake  . Hypertension     takes Amlodipine nightly  . Hyperlipidemia     taking Flax Seed Oil and Fish Oil  . Shortness of  breath     with exertion  . Emphysema   . Pneumonia     x 2 ;last time back in 1999  . Hx of migraines 90's    after menopause HA stopped  . Arthritis     all over  . Joint pain   . Joint swelling   . Osteopenia     takes Calcium and Vit D bid  . Back pain   . Dry skin   . Liver cyst   . IBS (irritable bowel syndrome)   . Diarrhea   . Blood transfusion     at age 75  . Bilateral cataracts   . Hot flashes     takes Prozac daily  . Lung nodule     right  . Lung cancer   . Bruises easily   . Gallstones   . Anemia     Family History  Problem Relation Age of Onset  . Alcohol abuse    . Depression    . Hearing loss    . Heart disease    . Hypertension    . Anesthesia problems Neg Hx   . Hypotension Neg Hx   . Malignant hyperthermia Neg Hx   . Pseudochol deficiency Neg Hx   . Hypertension Brother     History   Social History  . Marital Status: Married  Spouse Name: N/A    Number of Children: N/A  . Years of Education: N/A   Occupational History  . retired    Social History Main Topics  . Smoking status: Former Smoker -- 0.5 packs/day for 50 years    Types: Cigarettes    Quit date: 09/23/2011  . Smokeless tobacco: Never Used     Comment: quit smoking about a month ago  . Alcohol Use: 0.0 oz/week     Comment: 2 beers every night  . Drug Use: No  . Sexually Active: No   Other Topics Concern  . None   Social History Narrative  . None    Current outpatient prescriptions:acetaminophen (TYLENOL) 325 MG tablet, Take 650 mg by mouth every 12 (twelve) hours as needed. Takes 2 tabs every 12 hours, Disp: , Rfl: ;  albuterol (PROVENTIL HFA;VENTOLIN HFA) 108 (90 BASE) MCG/ACT inhaler, Inhale 2 puffs into the lungs every 6 (six) hours as needed. For wheezing , Disp: , Rfl: ;  amLODipine (NORVASC) 5 MG tablet, Take 5 mg by mouth at bedtime. , Disp: , Rfl:  budesonide-formoterol (SYMBICORT) 160-4.5 MCG/ACT inhaler, Inhale 2 puffs into the lungs 2 (two) times  daily., Disp: 1 Inhaler, Rfl: 12;  calcium-vitamin D (OSCAL WITH D) 500-200 MG-UNIT per tablet, Take 1 tablet by mouth daily. , Disp: , Rfl: ;  diphenhydrAMINE (BENADRYL) 25 MG tablet, Take 25 mg by mouth every 6 (six) hours as needed., Disp: , Rfl: ;  FLUoxetine (PROZAC) 20 MG tablet, Take 20 mg by mouth 2 (two) times daily. , Disp: , Rfl:  fluticasone (FLONASE) 50 MCG/ACT nasal spray, Place 2 sprays into the nose daily as needed. For stuffy nose , Disp: , Rfl: ;  Magnesium 250 MG TABS, Take 1 tablet by mouth daily., Disp: , Rfl: ;  Multiple Vitamin (MULITIVITAMIN WITH MINERALS) TABS, Take 1 tablet by mouth daily., Disp: , Rfl: ;  [DISCONTINUED] Calcium Carbonate-Vitamin D (CALCIUM + D PO), Take by mouth daily. 500+400 , Disp: , Rfl:   EXAM:  Filed Vitals:   06/22/12 1259  BP: 124/84  Pulse: 79  Temp: 98.1 F (36.7 C)    Body mass index is 25.61 kg/(m^2).  GENERAL: vitals reviewed and listed above, alert, oriented, appears well hydrated and in no acute distress  HEENT: atraumatic, conjunttiva clear, no obvious abnormalities on inspection of external nose and ears  NECK: no obvious masses on inspection  LUNGS: clear to auscultation bilaterally, no wheezes, rales or rhonchi, good air movement  CV: HRRR, no peripheral edema  LEGS: dry skin and scaly erythematous rash L LE  MS: moves all extremities without noticeable abnormality  PSYCH: pleasant and cooperative, no obvious depression or anxiety  ASSESSMENT AND PLAN:  Discussed the following assessment and plan:  1. Depression  TSH, will continue prozac  2. Hypertension  Cont amlodipine and recs to increase exercise and eat a healthy diet  3. Hyperglycemia screening Hemoglobin A1c  4. Hyperlipidemia screening Lipid Panel  5. Skin rash, LLE, appears to be eczema Steroid Cr, emollient, follow up 2 months  6. Osteopenia     DEXA ordered, cont calcium and vitamin D -We reviewed the PMH, PSH, FH, SH, Meds and Allergies. -We provided  refills for any medications we will prescribe as needed. -We addressed current concerns per orders and patient instructions. -We have asked for records for pertinent exams, studies, vaccines and notes from previous providers. -We have advised patient to follow up per instructions below. -tdap vaccine given today  -  Patient advised to return or notify a doctor immediately if symptoms worsen or persist or new concerns arise.  Patient Instructions  -We have ordered labs or studies at this visit. It can take up to 1-2 weeks for results and processing. We will contact you with instructions IF your results are abnormal. Normal results will be released to your [facility]. If you have not heard from [place] or can not find your results in [facility] in 2 weeks please contact our office.  -on rash on leg - use eucerin or cerave cream daily and use your steroid cream daily for 2 weeks. If does not resolve please let me know.   -PLEASE SIGN UP FOR MYCHART TODAY   We recommend the following healthy lifestyle measures: - eat a healthy diet consisting of lots of vegetables, fruits, beans, nuts, seeds, healthy meats such as white chicken and fish and whole grains.  - avoid fried foods, fast food, processed foods, sodas, red meet and other fattening foods.  - get a least 150 minutes of aerobic exercise per week.   Follow up in: 1-2 months      Tykera Skates R.

## 2012-06-22 NOTE — Addendum Note (Signed)
Addended by: Raj Janus T on: 06/22/2012 01:47 PM   Modules accepted: Orders

## 2012-06-23 ENCOUNTER — Telehealth: Payer: Self-pay | Admitting: Family Medicine

## 2012-06-23 NOTE — Telephone Encounter (Signed)
Please let patient know,   -cholesterol is a bit high  The best treatment to hopefully reverse these findings and prevent adverse health outcomes is a healthy diet and regular exercise.  Schedule follow up in about 3-4 months if not already scheduled to do so.

## 2012-06-27 NOTE — Telephone Encounter (Signed)
Called and spoke with pt and pt is aware.  

## 2012-06-28 ENCOUNTER — Ambulatory Visit (HOSPITAL_COMMUNITY)
Admission: RE | Admit: 2012-06-28 | Discharge: 2012-06-28 | Disposition: A | Discharge status: Home / Self Care | Payer: Medicare Other | Source: Ambulatory Visit | Attending: Family Medicine | Admitting: Family Medicine

## 2012-06-28 DIAGNOSIS — Z1231 Encounter for screening mammogram for malignant neoplasm of breast: Secondary | ICD-10-CM | POA: Insufficient documentation

## 2012-07-17 ENCOUNTER — Encounter: Payer: Self-pay | Admitting: Internal Medicine

## 2012-07-17 ENCOUNTER — Ambulatory Visit (INDEPENDENT_AMBULATORY_CARE_PROVIDER_SITE_OTHER): Payer: Medicare Other | Admitting: Internal Medicine

## 2012-07-17 VITALS — BP 118/74 | HR 78 | Temp 97.9°F | Ht 62.0 in | Wt 139.0 lb

## 2012-07-17 DIAGNOSIS — J209 Acute bronchitis, unspecified: Secondary | ICD-10-CM

## 2012-07-17 DIAGNOSIS — J449 Chronic obstructive pulmonary disease, unspecified: Secondary | ICD-10-CM

## 2012-07-17 DIAGNOSIS — C3490 Malignant neoplasm of unspecified part of unspecified bronchus or lung: Secondary | ICD-10-CM

## 2012-07-17 DIAGNOSIS — F17200 Nicotine dependence, unspecified, uncomplicated: Secondary | ICD-10-CM

## 2012-07-17 MED ORDER — LEVOFLOXACIN 500 MG PO TABS: 500.0000 mg | ORAL_TABLET | Freq: Every day | ORAL | Status: DC

## 2012-07-17 NOTE — Patient Instructions (Addendum)
#  acute sinus/bronchitis  -  take levaquin 500mg  once daily  X 6 days - if this gets worse, call us and we need to start prednisone  #COPD  - stable. No oxygen drop with walking  - continue symbicort - alpha 1 at followup - will discuss tudorza replacement for symbicort at followup    #tremors  - if gets worse, talk to your PMD  #lung cancer surveillance  - through Dr Arbutus Ped  #followup  - 9 months with CAT score; cancel full PFT with Jerolyn Shin

## 2012-07-17 NOTE — Progress Notes (Signed)
Subjective:    Patient ID: [name], female    DOB: 05-08-1941, 71 y.o.   MRN: [id number]  HPI I  PMD is Dr Toula Moos.   #Known spring allergies,   #IBS, OA - knees and hip and does regular water exercises.   #Drinks 2 coors lite each night   -CAGE -negative early 2013.    #Ex-Smoker  - 25 pack-year smoking history. Quit 2013 after diagnosis of lung cancer.  #COPD - 2013: Pre RUL lobectomy and Pre Rx: fev1 1.1L/60% , DLCO 9.6/56%, TLC 5.6/128%  - 2013 May: Post lobectomy and post Rx: fev1: 1.28L/60%, ratio 49 . No desat walk test - Rx spiriva, symbicort since spring 2013  # STage 1 lung cancer: pT1b, pN0 Stage 08 October 2011  - s/p lobectomy by Dr Tyrone Sage  - surveillance by Dr Shirline Frees   OV 07/17/2012  FU COPD, ex- smoker, lung cancer - s/p lobectomy   -COPD - CAT score 6. OVerall well but does have dyspnea on exertion for 1 flight of stairs that is stable and old.  COmpliant with symbicort. Has had flu shot. Walking desaturation test on 07/17/2012 185 feet x 3 laps:  did NOT desaturate. Rest pulse ox was 95%, final pulse ox was 90%. HR response 77/min at rest to 93/min at peak exertion.    - Smoking: still in remission  - Lung cancer: having surveillance through Dr Arbutus Ped. No new issues   - new problem: last several days to a week: increased sinus congestion and post nasal drip associated with increased cough. Sick contact + (husband). No associated fever.   CAT COPD Symptom & Quality of Life Score (GSK trademark) 0 is no burden. 5 is highest burden 07/17/2012   Never Cough -> Cough all the time 2  No phlegm in chest -> Chest is full of phlegm 0  No chest tightness -> Chest feels very tight 0  No dyspnea for 1 flight stairs/hill -> Very dyspneic for 1 flight of stairs 2  No limitations for ADL at home -> Very limited with ADL at home 0  Confident leaving home -> Not at all confident leaving home 0  Sleep soundly -> Do not sleep soundly because of lung  condition 0  Lots of Energy -> No energy at all 2  TOTAL Score (max 40)  6       Review of Systems  Constitutional: Negative for fever and unexpected weight change.  HENT: Negative for ear pain, nosebleeds, congestion, sore throat, rhinorrhea, sneezing, trouble swallowing, dental problem, postnasal drip and sinus pressure.   Eyes: Negative for redness and itching.  Respiratory: Negative for cough, chest tightness, shortness of breath and wheezing.   Cardiovascular: Negative for palpitations and leg swelling.  Gastrointestinal: Negative for nausea and vomiting.  Genitourinary: Negative for dysuria.  Musculoskeletal: Negative for joint swelling.  Skin: Negative for rash.  Neurological: Negative for headaches.  Hematological: Does not bruise/bleed easily.  Psychiatric/Behavioral: Negative for dysphoric mood. The patient is not nervous/anxious.        Objective:   Physical Exam Vitals reviewed. Constitutional: She is oriented to person, place, and time. She appears well-developed and well-nourished. No distress.  HENT:  Head: Normocephalic and atraumatic. POST NASAL DRIP + Right Ear: External ear normal.  Left Ear: External ear normal.  Mouth/Throat: Oropharynx is clear and moist. No oropharyngeal exudate.  Eyes: Conjunctivae and EOM are normal. Pupils are equal, round, and reactive to light. Right eye exhibits no discharge. Left eye  exhibits no discharge. No scleral icterus.  Neck: Normal range of motion. Neck supple. No JVD present. No tracheal deviation present. No thyromegaly present.  Cardiovascular: Normal rate, regular rhythm, normal heart sounds and intact distal pulses.  Exam reveals no gallop and no friction rub.   No murmur heard. Pulmonary/Chest: Effort normal and breath sounds normal. No respiratory distress. She has no wheezes. She has no rales. She exhibits no tenderness.  - Rt chest droop +  - RT chest scar + Abdominal: Soft. Bowel sounds are normal. She exhibits  no distension and no mass. There is no tenderness. There is no rebound and no guarding.  Musculoskeletal: Normal range of motion. She exhibits no edema and no tenderness.  Lymphadenopathy:    She has no cervical adenopathy.  Neurological: She is alert and oriented to person, place, and time. She has normal reflexes. No cranial nerve deficit. She exhibits normal muscle tone. Coordination normal.  Skin: Skin is warm and dry. No rash noted. She is not diaphoretic. No erythema. No pallor.  Psychiatric: She has a normal mood and affect. Her behavior is normal. Judgment and thought content normal.         Assessment & Plan:

## 2012-07-19 ENCOUNTER — Telehealth: Payer: Self-pay | Admitting: Internal Medicine

## 2012-07-19 NOTE — Assessment & Plan Note (Signed)
Surveillance through DR [facility]. If she is interested in consolidating that here in pulmonary she is  Welcome to do that

## 2012-07-19 NOTE — Assessment & Plan Note (Signed)
Stable disease. Doing well on symbicort. Did not desaturate dec 2013 this visit. Will check if she is interesed in tudorza at next visit (she was intolerant to spiriva). Will check alpha 1 at next visit

## 2012-07-19 NOTE — Assessment & Plan Note (Signed)
Short course levaquin. If worse she is to call and will do pred burst

## 2012-07-19 NOTE — Telephone Encounter (Signed)
Candise Bowens  Let her know I found a spirometry on her post lobectomy. So no need for pft test at return in 9 mponths. I have canceled that order. Instead we will do alpha 1 at fu  Thanks MR

## 2012-07-19 NOTE — Assessment & Plan Note (Signed)
Still in remission. Congratulated

## 2012-07-21 NOTE — Telephone Encounter (Signed)
Called spoke with patient, she is aware of no need for PFT and that MR would like to do an A1AT at next ov.  Pt okay with this and nothing further needed.

## 2012-07-21 NOTE — Telephone Encounter (Signed)
Returning call.  [phone]

## 2012-07-21 NOTE — Telephone Encounter (Signed)
[email or web address] Peeters, CMA  

## 2012-10-04 ENCOUNTER — Other Ambulatory Visit: Payer: Self-pay | Admitting: Internal Medicine

## 2012-11-13 ENCOUNTER — Other Ambulatory Visit (HOSPITAL_BASED_OUTPATIENT_CLINIC_OR_DEPARTMENT_OTHER): Payer: Medicare Other | Admitting: Lab

## 2012-11-13 ENCOUNTER — Ambulatory Visit (HOSPITAL_COMMUNITY)
Admission: RE | Admit: 2012-11-13 | Discharge: 2012-11-13 | Disposition: A | Discharge status: Home / Self Care | Payer: Medicare Other | Source: Ambulatory Visit | Attending: Internal Medicine | Admitting: Internal Medicine

## 2012-11-13 ENCOUNTER — Encounter (HOSPITAL_COMMUNITY): Payer: Self-pay

## 2012-11-13 DIAGNOSIS — K449 Diaphragmatic hernia without obstruction or gangrene: Secondary | ICD-10-CM | POA: Insufficient documentation

## 2012-11-13 DIAGNOSIS — I700 Atherosclerosis of aorta: Secondary | ICD-10-CM | POA: Insufficient documentation

## 2012-11-13 DIAGNOSIS — C3490 Malignant neoplasm of unspecified part of unspecified bronchus or lung: Secondary | ICD-10-CM

## 2012-11-13 DIAGNOSIS — I2510 Atherosclerotic heart disease of native coronary artery without angina pectoris: Secondary | ICD-10-CM | POA: Insufficient documentation

## 2012-11-13 DIAGNOSIS — K7689 Other specified diseases of liver: Secondary | ICD-10-CM | POA: Insufficient documentation

## 2012-11-13 DIAGNOSIS — Z902 Acquired absence of lung [part of]: Secondary | ICD-10-CM | POA: Insufficient documentation

## 2012-11-13 DIAGNOSIS — C3410 Malignant neoplasm of upper lobe, unspecified bronchus or lung: Secondary | ICD-10-CM

## 2012-11-13 LAB — CBC WITH DIFFERENTIAL/PLATELET
Basophils Absolute: 0.1 10*3/uL (ref 0.0–0.1)
EOS%: 2.1 % (ref 0.0–7.0)
HCT: 42.5 % (ref 34.8–46.6)
HGB: 14.7 g/dL (ref 11.6–15.9)
LYMPH%: 26.1 % (ref 14.0–49.7)
MCH: 32.2 pg (ref 25.1–34.0)
MCHC: 34.6 g/dL (ref 31.5–36.0)
MONO#: 0.3 10*3/uL (ref 0.1–0.9)
NEUT%: 62.9 % (ref 38.4–76.8)
Platelets: 197 10*3/uL (ref 145–400)
lymph#: 1.2 10*3/uL (ref 0.9–3.3)

## 2012-11-13 LAB — COMPREHENSIVE METABOLIC PANEL (CC13)
ALT: 15 U/L (ref 0–55)
AST: 16 U/L (ref 5–34)
Alkaline Phosphatase: 78 U/L (ref 40–150)
BUN: 11.3 mg/dL (ref 7.0–26.0)
Calcium: 9.3 mg/dL (ref 8.4–10.4)
Chloride: 103 mEq/L (ref 98–107)
Creatinine: 0.9 mg/dL (ref 0.6–1.1)
Total Bilirubin: 0.83 mg/dL (ref 0.20–1.20)

## 2012-11-13 MED ORDER — IOHEXOL 300 MG/ML  SOLN
80.0000 mL | Freq: Once | INTRAMUSCULAR | Status: AC | PRN
  Administered 2012-11-13: 80 mL via INTRAVENOUS

## 2012-11-15 ENCOUNTER — Ambulatory Visit (HOSPITAL_BASED_OUTPATIENT_CLINIC_OR_DEPARTMENT_OTHER): Payer: Medicare Other | Admitting: Internal Medicine

## 2012-11-15 ENCOUNTER — Telehealth: Payer: Self-pay | Admitting: Internal Medicine

## 2012-11-15 ENCOUNTER — Encounter: Payer: Self-pay | Admitting: Internal Medicine

## 2012-11-15 VITALS — BP 122/81 | HR 71 | Temp 97.2°F | Resp 20 | Ht 62.0 in | Wt 143.6 lb

## 2012-11-15 DIAGNOSIS — C3491 Malignant neoplasm of unspecified part of right bronchus or lung: Secondary | ICD-10-CM

## 2012-11-15 DIAGNOSIS — C3410 Malignant neoplasm of upper lobe, unspecified bronchus or lung: Secondary | ICD-10-CM

## 2012-11-15 NOTE — Progress Notes (Signed)
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   PRIOR THERAPY: Status post wedge resection of the right upper lobe lung nodule under the care of Dr. Dorris Fetch on 10/20/2011.   CURRENT THERAPY: Observation.  INTERVAL HISTORY: [name] 72 y.o. female returns to the clinic today for six-month followup visit. The patient is feeling fine today with no specific complaints. She denied having any significant chest pain, shortness breath, cough or hemoptysis. She denied having any weight loss or night sweats. She had repeat CT scan of the chest performed recently and she is here for evaluation and discussion of her scan results.  MEDICAL HISTORY: Past Medical History  Diagnosis Date  . COPD (chronic obstructive pulmonary disease)   . Depression   . Anxiety   . Gout     last time 6-63yrs ago   . Migraine   . OCD (obsessive compulsive disorder)   . Tinea corporis   . Acute bronchitis   . Allergic rhinitis   . Gout   . Macrocytosis   . Skin rash   . PONV (postoperative nausea and vomiting)     hard to wake  . Hypertension     takes Amlodipine nightly  . Hyperlipidemia     taking Flax Seed Oil and Fish Oil  . Shortness of breath     with exertion  . Emphysema   . Pneumonia     x 2 ;last time back in 1999  . Hx of migraines 90's    after menopause HA stopped  . Arthritis     all over  . Joint pain   . Joint swelling   . Osteopenia     takes Calcium and Vit D bid  . Back pain   . Dry skin   . Liver cyst   . IBS (irritable bowel syndrome)   . Diarrhea   . Blood transfusion     at age 59  . Bilateral cataracts   . Hot flashes     takes Prozac daily  . Lung nodule     right  . Lung cancer   . Bruises easily    . Gallstones   . Anemia   . Arthritis   . Cancer   . Chicken pox   . Headache     frequent  . Seasonal allergies   . Hypertension   . History of blood transfusion     ALLERGIES:  is allergic to clindamycin/lincomycin; penicillins; spiriva; and sulfa antibiotics.  MEDICATIONS:  Current Outpatient Prescriptions  Medication Sig Dispense Refill  . albuterol (PROVENTIL HFA;VENTOLIN HFA) 108 (90 BASE) MCG/ACT inhaler Inhale 2 puffs into the lungs every 6 (six) hours as needed. For wheezing       . amLODipine (NORVASC) 5 MG tablet Take 5 mg by mouth at bedtime.       . diphenhydrAMINE (BENADRYL) 25 MG tablet Take 25 mg by mouth every 6 (six) hours as needed.      Marland Kitchen FLUoxetine (PROZAC) 20 MG tablet Take 20 mg by mouth 2 (two) times daily.       . fluticasone (FLONASE) 50 MCG/ACT nasal spray Place 2 sprays into the nose daily as needed. For stuffy nose       .  SYMBICORT 160-4.5 MCG/ACT inhaler INHALE 2 PUFFS INTO THE LUNGS 2 TIMES DAILY  1 Inhaler  6  . [DISCONTINUED] Calcium Carbonate-Vitamin D (CALCIUM + D PO) Take by mouth daily. 500+400        No current facility-administered medications for this visit.    SURGICAL HISTORY:  Past Surgical History  Procedure Laterality Date  . Inguinal hernia repair  2000  . Neuroplasty decompression median nerve at carpal tunnel    . Rotator cuff repair  2001    right  . Tonsillectomy    . Tonsillectomy  as a child    and adenoids  . Dilation and curettage of uterus      at age 32  . Cyst removed  >37yrs ago    from left leg  . Carpal tunnel release  80's    right   . Total abdominal hysterectomy  2003  . Total hip arthroplasty  2011    left  . Colonoscopy    . Video bronchoscopy  10/20/2011    Procedure: VIDEO BRONCHOSCOPY;  Surgeon: [name], MD;  Location: [facility] OR;  Service: Thoracic;  Laterality: N/A;  . Wedge resection  10-20-2011    rt lung  - for lung cancer  . Total hip arthroplasty  03/07/2012    Procedure: TOTAL HIP  ARTHROPLASTY ANTERIOR APPROACH;  Surgeon: [name], MD;  Location: [facility] ORS;  Service: Orthopedics;  Laterality: Right;    REVIEW OF SYSTEMS:  A comprehensive review of systems was negative.   PHYSICAL EXAMINATION: General appearance: alert, cooperative and no distress Head: Normocephalic, without obvious abnormality, atraumatic Neck: no adenopathy Resp: clear to auscultation bilaterally Cardio: regular rate and rhythm, S1, S2 normal, no murmur, click, rub or gallop GI: soft, non-tender; bowel sounds normal; no masses,  no organomegaly Extremities: extremities normal, atraumatic, no cyanosis or edema  ECOG PERFORMANCE STATUS: 0 - Asymptomatic  Blood pressure 122/81, pulse 71, temperature 97.2 F (36.2 C), temperature source Oral, resp. rate 20, height 5\' 2"  (1.575 m), weight 143 lb 9.6 oz (65.137 kg).  LABORATORY DATA: Lab Results  Component Value Date   WBC 4.7 11/13/2012   HGB 14.7 11/13/2012   HCT 42.5 11/13/2012   MCV 93.2 11/13/2012   PLT 197 11/13/2012      Chemistry      Component Value Date/Time   NA 138 11/13/2012 0939   NA 132* 03/09/2012 0333   K 4.2 11/13/2012 0939   K 4.0 03/09/2012 0333   CL 103 11/13/2012 0939   CL 99 03/09/2012 0333   CO2 26 11/13/2012 0939   CO2 28 03/09/2012 0333   BUN 11.3 11/13/2012 0939   BUN 8 03/09/2012 0333   CREATININE 0.9 11/13/2012 0939   CREATININE 0.67 03/09/2012 0333      Component Value Date/Time   CALCIUM 9.3 11/13/2012 0939   CALCIUM 8.6 03/09/2012 0333   ALKPHOS 78 11/13/2012 0939   ALKPHOS 44 10/22/2011 0335   AST 16 11/13/2012 0939   AST 20 10/22/2011 0335   ALT 15 11/13/2012 0939   ALT 14 10/22/2011 0335   BILITOT 0.83 11/13/2012 0939   BILITOT 0.5 10/22/2011 0335       RADIOGRAPHIC STUDIES: Ct Chest W Contrast  11/13/2012  *RADIOLOGY REPORT*  Clinical Data: Lung cancer diagnosed March 2013, restaging. Previous partial right upper lobectomy.  Asymptomatic.  CT CHEST WITH CONTRAST  Technique:  Multidetector CT imaging of the chest was performed  following the standard protocol during bolus administration of  intravenous contrast.  Contrast: 80mL OMNIPAQUE IOHEXOL 300 MG/ML  SOLN  Comparison: CT 05/10/2012  Findings: Mild to moderate atherosclerotic aortic and coronary arterial calcification.  No pericardial or pleural effusion.  Trace superior pericardial recess fluid is stable.  No lymphadenopathy. Small left apex vascular collaterals are incidentally re- identified.  Evidence of partial right upper lobectomy without mass lesion or other abnormality at the suture site.  Severe centrilobular emphysematous changes are re-identified.  Central airways are patent.  Small bilateral fat containing Bochdalek hernias are re- identified. No new pulmonary nodule, mass, or consolidation.  Incomplete imaging of the upper abdomen demonstrates innumerable low density hepatic cysts.  No lytic or sclerotic osseous lesion. Thoracic spine disc degenerative change again noted.  IMPRESSION: No CT evidence for disease recurrence/local metastasis post right upper lobe wedge resection.   Original Report Authenticated By: Christiana Pellant, M.D.     ASSESSMENT: This is a very pleasant 72 years old white female with history of stage IA non-small cell lung cancer status post wedge resection of the right upper lobe and currently on observation. She has no evidence for disease recurrence on his recent scan.  PLAN: I discussed the scan results with the patient. I recommended for her to continue on observation with repeat CT scan of the chest in 6 months. She was advised to call immediately if she has any concerning symptoms in the interval.  All questions were answered. The patient knows to call the clinic with any problems, questions or concerns. We can certainly see the patient much sooner if necessary.

## 2012-11-15 NOTE — Telephone Encounter (Signed)
gv and printed appt schedule for pt for OCT...pt aware cs will call with d/t of ct

## 2012-11-15 NOTE — Patient Instructions (Signed)
No evidence for disease recurrence on the recent scan.  Followup visit in 6 months with repeat CT scan of the chest. 

## 2012-11-23 ENCOUNTER — Encounter: Payer: Medicare Other | Admitting: Cardiothoracic Surgery

## 2012-12-14 ENCOUNTER — Encounter: Payer: Medicare Other | Admitting: Cardiothoracic Surgery

## 2012-12-15 ENCOUNTER — Encounter (HOSPITAL_COMMUNITY): Payer: Self-pay | Admitting: Pharmacy Technician

## 2012-12-20 ENCOUNTER — Ambulatory Visit (INDEPENDENT_AMBULATORY_CARE_PROVIDER_SITE_OTHER): Payer: Medicare Other | Admitting: Family Medicine

## 2012-12-20 ENCOUNTER — Encounter: Payer: Self-pay | Admitting: Family Medicine

## 2012-12-20 VITALS — BP 124/82 | Temp 98.6°F | Wt 143.0 lb

## 2012-12-20 DIAGNOSIS — R21 Rash and other nonspecific skin eruption: Secondary | ICD-10-CM

## 2012-12-20 DIAGNOSIS — Z01818 Encounter for other preprocedural examination: Secondary | ICD-10-CM

## 2012-12-20 NOTE — Progress Notes (Signed)
Chief Complaint  Patient presents with  . surgical clearance    HPI:  Here for exam to optimize medical care prior to knee surgery: -has COPD, non small cell lung ca s/p wedge resection of upper lobe lung nodule - recently saw her pulmonologist and oncologist for these issues - doing well with no increased SOB, no o2 use, no DOE or orthopnea and has had hip surgery since lung resection and did fine -CV disease: well controlled HTN -denies hx CAD, MI, arrhythmia, stroke, DM or renal disease -exercise capacity: > 4 METs, walks up flight of stairs fine, walks 1-2 miles with no problems, can do heavy housework with no issues -has had multiple prior surgeries and tolerated them well -had labs last month with CBC and CMP good -she is having EKG and labs at the hospital  Skin Rash: -ongoing, better now but has flares from time to time -she is followed by Dr. Terri Piedra in derm  ROS: See pertinent positives and negatives per HPI.  Past Medical History  Diagnosis Date  . COPD (chronic obstructive pulmonary disease)   . Depression   . Anxiety   . Gout     last time 6-40yrs ago   . Migraine   . OCD (obsessive compulsive disorder)   . Tinea corporis   . Acute bronchitis   . Allergic rhinitis   . Gout   . Macrocytosis   . Skin rash   . PONV (postoperative nausea and vomiting)     hard to wake  . Hypertension     takes Amlodipine nightly  . Hyperlipidemia     taking Flax Seed Oil and Fish Oil  . Shortness of breath     with exertion  . Emphysema   . Pneumonia     x 2 ;last time back in 1999  . Hx of migraines 90's    after menopause HA stopped  . Arthritis     all over  . Joint pain   . Joint swelling   . Osteopenia     takes Calcium and Vit D bid  . Back pain   . Dry skin   . Liver cyst   . IBS (irritable bowel syndrome)   . Diarrhea   . Blood transfusion     at age 7  . Bilateral cataracts   . Hot flashes     takes Prozac daily  . Lung nodule     right  . Lung  cancer   . Bruises easily   . Gallstones   . Anemia   . Arthritis   . Cancer   . Chicken pox   . Headache     frequent  . Seasonal allergies   . Hypertension   . History of blood transfusion     Family History  Problem Relation Age of Onset  . Alcohol abuse    . Depression    . Hearing loss    . Heart disease    . Hypertension    . Anesthesia problems Neg Hx   . Hypotension Neg Hx   . Malignant hyperthermia Neg Hx   . Pseudochol deficiency Neg Hx   . Hypertension Brother     History   Social History  . Marital Status: Married    Spouse Name: N/A    Number of Children: N/A  . Years of Education: N/A   Occupational History  . retired    Social History Main Topics  . Smoking status: Former Smoker --  0.50 packs/day for 50 years    Types: Cigarettes    Quit date: 09/23/2011  . Smokeless tobacco: Never Used     Comment: quit smoking about a month ago  . Alcohol Use: 0.0 oz/week     Comment: 2 beers every night  . Drug Use: No  . Sexually Active: No   Other Topics Concern  . None   Social History Narrative  . None    Current outpatient prescriptions:acetaminophen (TYLENOL) 650 MG CR tablet, Take 1,300 mg by mouth 2 (two) times daily., Disp: , Rfl: ;  acidophilus (RISAQUAD) CAPS, Take 1 capsule by mouth daily., Disp: , Rfl: ;  albuterol (PROVENTIL HFA;VENTOLIN HFA) 108 (90 BASE) MCG/ACT inhaler, Inhale 2 puffs into the lungs every 6 (six) hours as needed. For wheezing , Disp: , Rfl:  amLODipine (NORVASC) 5 MG tablet, Take 5 mg by mouth at bedtime. , Disp: , Rfl: ;  budesonide-formoterol (SYMBICORT) 160-4.5 MCG/ACT inhaler, Inhale 2 puffs into the lungs 2 (two) times daily., Disp: , Rfl: ;  cetirizine (ZYRTEC) 10 MG tablet, Take 10 mg by mouth daily., Disp: , Rfl: ;  diphenhydrAMINE (BENADRYL) 25 MG tablet, Take 25 mg by mouth every 6 (six) hours as needed for allergies. , Disp: , Rfl:  FLUoxetine (PROZAC) 20 MG tablet, Take 20 mg by mouth 2 (two) times daily. ,  Disp: , Rfl: ;  fluticasone (FLONASE) 50 MCG/ACT nasal spray, Place 2 sprays into the nose daily as needed. For stuffy nose , Disp: , Rfl: ;  GLUCOSAMINE HCL PO, Take 1,000 mg by mouth 2 (two) times daily., Disp: , Rfl: ;  ibuprofen (ADVIL,MOTRIN) 200 MG tablet, Take 200 mg by mouth every 6 (six) hours as needed for pain., Disp: , Rfl:  Magnesium 250 MG TABS, Take 250 mg by mouth daily., Disp: , Rfl: ;  [DISCONTINUED] Calcium Carbonate-Vitamin D (CALCIUM + D PO), Take by mouth daily. 500+400 , Disp: , Rfl:   EXAM:  Filed Vitals:   12/20/12 1006  BP: 124/82  Temp: 98.6 F (37 C)    Body mass index is 26.15 kg/(m^2).  GENERAL: vitals reviewed and listed above, alert, oriented, appears well hydrated and in no acute distress  HEENT: atraumatic, conjunttiva clear, no obvious abnormalities on inspection of external nose and ears  NECK: no obvious masses on inspection  LUNGS: clear to auscultation bilaterally, no wheezes, rales or rhonchi, good air movement  CV: HRRR, no peripheral edema  MS: moves all extremities without noticeable abnormality  PSYCH: pleasant and cooperative, no obvious depression or anxiety  SKIN: few erythematous papules R shin  ASSESSMENT AND PLAN:  Discussed the following assessment and plan:  Preop examination The following was discussed with the patient and was faxed to her surgeon's office: - pt is low risk for intermediate risk surgery in terms of her cardiovascular risks -reports has labs and EKG scheduled for Monday which I agree with -she has good functional capacity -she has stable lung disease and is doing well with this - would advise she discuss anesthesia related risks with anesthesiology and her pulmonologist -she should discuss risks in terms of surgery with her surgeon -will defer to her surgeon for wound infection prophylaxis and venous thromboembolism prophylaxis -reviewed recent CBC and CMP - no anemia and normal Cr  Skin rash -advised she  schedule appt with dermatologist, use clobetasol sparingly and emmollient such as cerave daily, avoid scratching   -Patient advised to return or notify a doctor immediately if symptoms worsen  or persist or new concerns arise.  There are no Patient Instructions on file for this visit.   Colin Benton R.

## 2012-12-22 ENCOUNTER — Other Ambulatory Visit (HOSPITAL_COMMUNITY): Payer: Self-pay | Admitting: *Deleted

## 2012-12-22 NOTE — Patient Instructions (Addendum)
20 [name]  12/22/2012   Your procedure is scheduled on 01-02-2013:   Report to [facility] at 910 AM.  Call this number if you have problems the morning of surgery [phone]   Remember:   Do not eat food or drink liquids :After Midnight.   o  Take these medicines the morning of surgery with A SIP OF WATER: symbicort, fluoxetine, certrizine, albuterol inhaler if needed, tylenol if needed                                SEE [name] PREPARING FOR SURGERY SHEET   Do not wear jewelry, make-up or nail polish.  Do not wear lotions, powders, or perfumes. You may wear deodorant.   Men may shave face and neck.  Do not bring valuables to the hospital.  Contacts, dentures or bridgework may not be worn into surgery.  Leave suitcase in the car. After surgery it may be brought to your room.  For patients admitted to the hospital, checkout time is 11:00 AM the day of discharge.   Patients discharged the day of surgery will not be allowed to drive home.  Name and phone number of your driver:  Special Instructions: N/A   Please read over the following fact sheets that you were given: MRSA Information, blood fact sheet, incentive spirometer fact sheet  Call Cain Sieve RN pre op nurse if needed [phone]    FAILURE TO FOLLOW THESE INSTRUCTIONS MAY RESULT IN THE CANCELLATION OF YOUR SURGERY. PATIENT SIGNATURE___________________________________________

## 2012-12-25 ENCOUNTER — Encounter (HOSPITAL_COMMUNITY)
Admission: RE | Admit: 2012-12-25 | Discharge: 2012-12-25 | Disposition: A | Discharge status: Home / Self Care | Payer: Medicare Other | Source: Ambulatory Visit | Attending: Orthopedic Surgery | Admitting: Orthopedic Surgery

## 2012-12-25 ENCOUNTER — Encounter (HOSPITAL_COMMUNITY): Payer: Self-pay

## 2012-12-25 DIAGNOSIS — Z01810 Encounter for preprocedural cardiovascular examination: Secondary | ICD-10-CM | POA: Insufficient documentation

## 2012-12-25 DIAGNOSIS — R9431 Abnormal electrocardiogram [ECG] [EKG]: Secondary | ICD-10-CM | POA: Insufficient documentation

## 2012-12-25 DIAGNOSIS — Z01812 Encounter for preprocedural laboratory examination: Secondary | ICD-10-CM | POA: Insufficient documentation

## 2012-12-25 LAB — URINALYSIS, ROUTINE W REFLEX MICROSCOPIC
Glucose, UA: NEGATIVE mg/dL
Protein, ur: NEGATIVE mg/dL
Specific Gravity, Urine: 1.025 (ref 1.005–1.030)

## 2012-12-25 LAB — URINE MICROSCOPIC-ADD ON

## 2012-12-25 LAB — CBC
HCT: 41.6 % (ref 36.0–46.0)
Hemoglobin: 14.4 g/dL (ref 12.0–15.0)
MCV: 92.7 fL (ref 78.0–100.0)
Platelets: 199 10*3/uL (ref 150–400)
RBC: 4.49 MIL/uL (ref 3.87–5.11)
WBC: 5.6 10*3/uL (ref 4.0–10.5)

## 2012-12-25 LAB — APTT: aPTT: 31 seconds (ref 24–37)

## 2012-12-25 LAB — SURGICAL PCR SCREEN
MRSA, PCR: POSITIVE — AB
Staphylococcus aureus: POSITIVE — AB

## 2012-12-25 LAB — BASIC METABOLIC PANEL
CO2: 30 mEq/L (ref 19–32)
Calcium: 9.8 mg/dL (ref 8.4–10.5)
Chloride: 100 mEq/L (ref 96–112)
Glucose, Bld: 98 mg/dL (ref 70–99)
Potassium: 4.1 mEq/L (ref 3.5–5.1)
Sodium: 139 mEq/L (ref 135–145)

## 2012-12-25 NOTE — Progress Notes (Signed)
+   mrsa, [employer] [place] results faxed to dr Charlann Boxer by epic

## 2012-12-25 NOTE — Progress Notes (Signed)
Medical clearance note dr Selena Batten on chart

## 2012-12-26 NOTE — H&P (Signed)
TOTAL KNEE ADMISSION H&P  Patient is being admitted for right total knee arthroplasty.  Subjective:  Chief Complaint:  Right knee OA / pain.  HPI: [name], 72 y.o. female, has a history of pain and functional disability in the right knee due to arthritis and has failed non-surgical conservative treatments for greater than 12 weeks to includeNSAID's and/or analgesics, corticosteriod injections, viscosupplementation injections and activity modification.  Onset of symptoms was gradual, starting >10 years ago with gradually worsening course since that time. The patient noted no past surgery on the right knee(s).  Patient currently rates pain in the right knee(s) at 7 out of 10 with activity. Patient has night pain, worsening of pain with activity and weight bearing, pain that interferes with activities of daily living, pain with passive range of motion, crepitus and joint swelling.  Patient has evidence of periarticular osteophytes and joint space narrowing by imaging studies. There is no active infection. Risks, benefits and expectations were discussed with the patient. Patient understand the risks, benefits and expectations and wishes to proceed with surgery.   D/C Plans:   Home with HHPT  Post-op Meds:   Rx given for ASA, Zanaflex, Iron, Colace and MiraLax  Tranexamic Acid:   To be given  Decadron:    To be given  FYI:    ASA post-op   Patient Active Problem List   Diagnosis Date Noted  . Acute bronchitis 07/19/2012  . Depression 06/22/2012  . Hypertension 06/22/2012  . S/P right THA, AA - Dr. 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 Chest wall pain following surgery 01/03/2012  . Moderate COPD (chronic obstructive pulmonary disease), Sees Dr. Marchelle Gearing, [facility] 09/12/2011  . Bronchogenic lung cancer, sees Dr. Arbutus Ped, Oncology and Dr. Tyrone Sage, Cardiothoracic surgery, and Dr. Marchelle Gearing, [facility] 09/12/2011  . Smoking 09/12/2011  . Macrocytosis 08/23/2011   Past Medical History  Diagnosis Date  .  COPD (chronic obstructive pulmonary disease)   . Depression   . Anxiety   . Gout     last time 6-80yrs ago   . Migraine   . OCD (obsessive compulsive disorder)   . Tinea corporis   . Acute bronchitis   . Allergic rhinitis   . Gout   . Macrocytosis   . Skin rash   . Hypertension     takes Amlodipine nightly  . Hyperlipidemia     taking Flax Seed Oil and Fish Oil  . Emphysema   . Hx of migraines 90's    after menopause HA stopped  . Arthritis     all over  . Joint pain   . Joint swelling   . Osteopenia     takes Calcium and Vit D bid  . Back pain   . Dry skin   . Liver cyst   . IBS (irritable bowel syndrome)   . Diarrhea   . Blood transfusion     at age 89  . Bilateral cataracts   . Hot flashes     takes Prozac daily  . Lung nodule     right  . Bruises easily   . Gallstones   . Arthritis   . Chicken pox   . Headache     frequent  . Seasonal allergies   . Hypertension   . History of blood transfusion   . PONV (postoperative nausea and vomiting)     hard to wake  . Pneumonia     x 2 ;last time back in 1999  . Lung cancer   .  Cancer   . Anemia     Past Surgical History  Procedure Laterality Date  . Inguinal hernia repair  2000  . Neuroplasty decompression median nerve at carpal tunnel    . Rotator cuff repair  2001    right  . Tonsillectomy    . Tonsillectomy  as a child    and adenoids  . Dilation and curettage of uterus      at age 35  . Cyst removed  >90yrs ago    from left leg  . Carpal tunnel release  80's    right   . Total abdominal hysterectomy  2003  . Total hip arthroplasty  2011    left  . Colonoscopy    . Video bronchoscopy  10/20/2011    Procedure: VIDEO BRONCHOSCOPY;  Surgeon: [name], MD;  Location: [facility] OR;  Service: Thoracic;  Laterality: N/A;  . Wedge resection  10-20-2011    rt lung  - for lung cancer  . Total hip arthroplasty  03/07/2012    Procedure: TOTAL HIP ARTHROPLASTY ANTERIOR APPROACH;  Surgeon: [name],  MD;  Location: [facility] ORS;  Service: Orthopedics;  Laterality: Right;    No prescriptions prior to admission   Allergies  Allergen Reactions  . Clindamycin/Lincomycin Nausea And Vomiting  . Penicillins Rash       . Spiriva (Tiotropium Bromide Monohydrate)     General intolerance  . Sulfa Antibiotics Rash    Severe Hives with skin reaction    History  Substance Use Topics  . Smoking status: Former Smoker -- 0.50 packs/day for 50 years    Types: Cigarettes    Quit date: 09/23/2011  . Smokeless tobacco: Never Used     Comment: quit smoking about a month ago  . Alcohol Use: 0.0 oz/week     Comment: 2 beers every night    Family History  Problem Relation Age of Onset  . Alcohol abuse    . Depression    . Hearing loss    . Heart disease    . Hypertension    . Anesthesia problems Neg Hx   . Hypotension Neg Hx   . Malignant hyperthermia Neg Hx   . Pseudochol deficiency Neg Hx   . Hypertension Brother      Review of Systems  Constitutional: Negative.   HENT: Positive for tinnitus.   Eyes: Negative.   Respiratory: Positive for shortness of breath (on exertion).   Cardiovascular: Negative.   Gastrointestinal: Positive for diarrhea.  Genitourinary: Negative.   Musculoskeletal: Positive for joint pain.  Skin: Positive for rash.  Neurological: Positive for headaches.  Endo/Heme/Allergies: Positive for environmental allergies.  Psychiatric/Behavioral: Positive for depression. The patient is nervous/anxious.     Objective:  Physical Exam  Constitutional: She is oriented to person, place, and time. She appears well-developed and well-nourished.  HENT:  Head: Normocephalic and atraumatic.  Mouth/Throat: Oropharynx is clear and moist.  Eyes: Pupils are equal, round, and reactive to light.  Neck: Neck supple. No JVD present. No tracheal deviation present. No thyromegaly present.  Cardiovascular: Normal rate, regular rhythm, normal heart sounds and intact distal pulses.    Respiratory: Effort normal and breath sounds normal. No stridor.  GI: Soft. There is no tenderness. There is no guarding.  Musculoskeletal:       Right knee: She exhibits decreased range of motion, swelling, abnormal alignment (valgus alignment) and bony tenderness. She exhibits no effusion, no ecchymosis, no laceration and no erythema. Tenderness found. Medial  joint line tenderness noted.  Lymphadenopathy:    She has no cervical adenopathy.  Neurological: She is alert and oriented to person, place, and time.  Skin: Skin is warm and dry.  Psychiatric: She has a normal mood and affect.    Labs:  Estimated body mass index is 26.26 kg/(m^2) as calculated from the following:   Height as of 11/15/12: 5\' 2"  (1.575 m).   Weight as of 11/15/12: 65.137 kg (143 lb 9.6 oz).   Imaging Review Plain radiographs demonstrate severe degenerative joint disease of the right knee(s). The overall alignment is  valgus. The bone quality appears to be good for age and reported activity level.  Assessment/Plan:  End stage arthritis, right knee   The patient history, physical examination, clinical judgment of the provider and imaging studies are consistent with end stage degenerative joint disease of the right knee(s) and total knee arthroplasty is deemed medically necessary. The treatment options including medical management, injection therapy arthroscopy and arthroplasty were discussed at length. The risks and benefits of total knee arthroplasty were presented and reviewed. The risks due to aseptic loosening, infection, stiffness, patella tracking problems, thromboembolic complications and other imponderables were discussed. The patient acknowledged the explanation, agreed to proceed with the plan and consent was signed. Patient is being admitted for inpatient treatment for surgery, pain control, PT, OT, prophylactic antibiotics, VTE prophylaxis, progressive ambulation and ADL's and discharge planning. The patient is  planning to be discharged home with home health services.   Anastasio Auerbach Devian Bartolomei   PAC  12/26/2012, 9:29 PM

## 2012-12-28 ENCOUNTER — Ambulatory Visit (INDEPENDENT_AMBULATORY_CARE_PROVIDER_SITE_OTHER): Payer: Medicare Other | Admitting: Cardiothoracic Surgery

## 2012-12-28 ENCOUNTER — Encounter: Payer: Self-pay | Admitting: Cardiothoracic Surgery

## 2012-12-28 DIAGNOSIS — Z9889 Other specified postprocedural states: Secondary | ICD-10-CM

## 2012-12-28 DIAGNOSIS — Z902 Acquired absence of lung [part of]: Secondary | ICD-10-CM

## 2012-12-28 DIAGNOSIS — C3410 Malignant neoplasm of upper lobe, unspecified bronchus or lung: Secondary | ICD-10-CM

## 2012-12-28 NOTE — Progress Notes (Signed)
[address]            [place] [address]          [phone]        [name] [facility] Medical Record #[id number] Date of Birth: 06/26/1941  Wynona Luna., DO   Chief Complaint:   PostOp Follow Up Visit 10/20/2011    OPERATIVE REPORT  PREOPERATIVE DIAGNOSIS: Right upper lobe 2-cm lung nodule, suspicious  for carcinoma.  POSTOPERATIVE DIAGNOSIS: Non-small-cell lung cancer.  PROCEDURE PERFORMED: Video bronchoscopy, right video-assisted  thoracoscopy with wedge and mini thoracotomy with wedge resection of  right upper lobe lung nodule and mediastinal lymph node dissection.  SURGEON: [name], MD  1. Lung, wedge biopsy/resection, Right upper lobe - INVASIVE POORLY DIFFERENTIATED SQUAMOUS CELL CARCINOMA, MEASURING 2.8 CM IN GREATEST DIMENSION. - LYMPH-VASCULAR INVASION IS NOT IDENTIFIED. - PLEURAL INVASION IS NOT IDENTIFIED. - MARGINS ARE NEGATIVE. - SEE ONCOLOGY TEMPLATE. 2. Lymph node, biopsy, 4 R - ONE BENIGN ANTHRACOTIC LYMPH NODE WITH NO TUMOR SEEN (0/1). 3. Lymph node, biopsy, 11 R - ONE BENIGN CAUTERIZED ANTHRACOTIC LYMPH NODE WITH NO TUMOR SEEN (0/1).  TNM code: pT1b, pN0, cM0  History of Present Illness:     Patient doing well following right upper lobectomy for stage I carcinoma the lung one year ago.  she has not noticed much change in her pulmonary reserve. 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 She is planning right knee replacement next week. She's had no pulmonary infection.  No hemoptysis     History  Smoking status  . Former Smoker -- 0.50 packs/day for 50 years  . Types: Cigarettes  . Quit date: 09/23/2011  Smokeless tobacco  . Never Used    Comment: quit smoking about a month ago       Allergies  Allergen Reactions  . Clindamycin/Lincomycin Nausea And Vomiting  . Penicillins Rash       . Spiriva (Tiotropium Bromide Monohydrate)     General intolerance  . Sulfa Antibiotics Rash    Severe Hives with skin reaction    Current Outpatient Prescriptions  Medication Sig Dispense Refill  . acetaminophen (TYLENOL) 650 MG CR tablet Take 1,300 mg by mouth 2 (two) times daily.      Marland Kitchen acidophilus (RISAQUAD) CAPS Take 1 capsule by mouth daily.      Marland Kitchen albuterol (PROVENTIL HFA;VENTOLIN HFA) 108 (90 BASE) MCG/ACT inhaler Inhale 2 puffs into the lungs every 6 (six) hours as needed. For wheezing       . amLODipine (NORVASC) 5 MG tablet Take 5 mg by mouth at bedtime.       . budesonide-formoterol (SYMBICORT) 160-4.5 MCG/ACT inhaler Inhale 2 puffs into the lungs 2 (two) times daily.      . cetirizine (ZYRTEC) 10 MG tablet Take 10 mg by mouth daily.      . ciprofloxacin (CIPRO) 500 MG tablet Take 500 mg by mouth 2 (two) times daily. X 4 days      . diphenhydrAMINE (BENADRYL) 25 MG tablet Take 25 mg by mouth every 6 (six) hours as needed for allergies.       Marland Kitchen FLUoxetine (PROZAC) 20 MG tablet Take 20 mg by  mouth 2 (two) times daily.       . fluticasone (FLONASE) 50 MCG/ACT nasal spray Place 2 sprays into the nose daily as needed. For stuffy nose       . GLUCOSAMINE HCL PO Take 1,000 mg by mouth 2 (two) times daily.      Marland Kitchen ibuprofen (ADVIL,MOTRIN) 200 MG tablet Take 200 mg by mouth every 6 (six) hours as needed for pain.      . Magnesium 250 MG TABS Take 250 mg by mouth daily.      Marland Kitchen tiZANidine (ZANAFLEX) 4 MG tablet 2 mg every 8 (eight) hours as needed.       . [DISCONTINUED] Calcium Carbonate-Vitamin D (CALCIUM + D PO) Take by mouth daily. 500+400        No current facility-administered medications for this visit.       Physical Exam: BP 121/81  Pulse 86  Resp 20  Ht 5\' 2"  (1.575 m)  Wt 140 lb (63.504 kg)  BMI 25.6 kg/m2  SpO2 95%  General appearance: alert and cooperative Heart: regular rate and rhythm, S1, S2 normal, no murmur, click, rub or gallop and normal apical  impulse Lungs: clear to auscultation bilaterally and normal percussion bilaterally Wound: The chest tube site and small access incision on the right are well-healed  Diagnostic Studies & Laboratory data:         Recent Radiology Findings:  *RADIOLOGY REPORT*  Clinical Data: Lung cancer diagnosed March 2013, restaging. Previous partial right upper lobectomy. Asymptomatic.  CT CHEST WITH CONTRAST  Technique: Multidetector CT imaging of the chest was performed following the standard protocol during bolus administration of intravenous contrast.  Contrast: 80mL OMNIPAQUE IOHEXOL 300 MG/ML SOLN  Comparison: CT 05/10/2012  Findings: Mild to moderate atherosclerotic aortic and coronary arterial calcification. No pericardial or pleural effusion. Trace superior pericardial recess fluid is stable. No lymphadenopathy. Small left apex vascular collaterals are incidentally re- identified.  Evidence of partial right upper lobectomy without mass lesion or other abnormality at the suture site. Severe centrilobular emphysematous changes are re-identified. Central airways are patent. Small bilateral fat containing Bochdalek hernias are re- identified. No new pulmonary nodule, mass, or consolidation.  Incomplete imaging of the upper abdomen demonstrates innumerable low density hepatic cysts. No lytic or sclerotic osseous lesion. Thoracic spine disc degenerative change again noted.  IMPRESSION: No CT evidence for disease recurrence/local metastasis post right upper lobe wedge resection.   Original Report Authenticated By: Christiana Pellant, M.D.   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  Mohamed. The patient would like to cut down on the number of medical appointments she has. She is being closely followed with CT scan by Dr. Gwenyth Bouillon every 6 months. She would like to continue followup with him at the cancer Center and come here as needed. When she has her followup scan she will have a copy sent to me.  I will see her back when necessary at her or Dr. Arbutus Ped to requested anytime.   [name] MD 12/28/2012 2:32 PM

## 2012-12-28 NOTE — Progress Notes (Signed)
Fax received and placed on pt chart matt babish called in cipro 500 mg bid x 4 days for pt

## 2013-01-02 ENCOUNTER — Inpatient Hospital Stay (HOSPITAL_COMMUNITY)
Admission: RE | Admit: 2013-01-02 | Discharge: 2013-01-04 | DRG: 470 | Disposition: A | Discharge status: Home Health | Payer: Medicare Other | Source: Ambulatory Visit | Attending: Orthopedic Surgery | Admitting: Orthopedic Surgery

## 2013-01-02 ENCOUNTER — Encounter (HOSPITAL_COMMUNITY): Payer: Self-pay | Admitting: *Deleted

## 2013-01-02 ENCOUNTER — Inpatient Hospital Stay (HOSPITAL_COMMUNITY): Payer: Medicare Other | Admitting: Anesthesiology

## 2013-01-02 ENCOUNTER — Encounter (HOSPITAL_COMMUNITY)
Admission: RE | Disposition: A | Discharge status: Home Health | Payer: Self-pay | Source: Ambulatory Visit | Attending: Orthopedic Surgery

## 2013-01-02 ENCOUNTER — Encounter (HOSPITAL_COMMUNITY): Payer: Self-pay | Admitting: Anesthesiology

## 2013-01-02 DIAGNOSIS — Z87891 Personal history of nicotine dependence: Secondary | ICD-10-CM

## 2013-01-02 DIAGNOSIS — E785 Hyperlipidemia, unspecified: Secondary | ICD-10-CM | POA: Diagnosis present

## 2013-01-02 DIAGNOSIS — I10 Essential (primary) hypertension: Secondary | ICD-10-CM | POA: Diagnosis present

## 2013-01-02 DIAGNOSIS — M899 Disorder of bone, unspecified: Secondary | ICD-10-CM | POA: Diagnosis present

## 2013-01-02 DIAGNOSIS — J449 Chronic obstructive pulmonary disease, unspecified: Secondary | ICD-10-CM | POA: Diagnosis present

## 2013-01-02 DIAGNOSIS — E663 Overweight: Secondary | ICD-10-CM

## 2013-01-02 DIAGNOSIS — D62 Acute posthemorrhagic anemia: Secondary | ICD-10-CM | POA: Diagnosis not present

## 2013-01-02 DIAGNOSIS — Z85118 Personal history of other malignant neoplasm of bronchus and lung: Secondary | ICD-10-CM

## 2013-01-02 DIAGNOSIS — Z96651 Presence of right artificial knee joint: Secondary | ICD-10-CM

## 2013-01-02 DIAGNOSIS — Z96652 Presence of left artificial knee joint: Secondary | ICD-10-CM | POA: Insufficient documentation

## 2013-01-02 DIAGNOSIS — Z01812 Encounter for preprocedural laboratory examination: Secondary | ICD-10-CM

## 2013-01-02 DIAGNOSIS — M949 Disorder of cartilage, unspecified: Secondary | ICD-10-CM | POA: Diagnosis present

## 2013-01-02 DIAGNOSIS — Z96649 Presence of unspecified artificial hip joint: Secondary | ICD-10-CM

## 2013-01-02 DIAGNOSIS — M1710 Unilateral primary osteoarthritis, unspecified knee: Principal | ICD-10-CM | POA: Diagnosis present

## 2013-01-02 DIAGNOSIS — J4489 Other specified chronic obstructive pulmonary disease: Secondary | ICD-10-CM | POA: Diagnosis present

## 2013-01-02 DIAGNOSIS — Z6825 Body mass index (BMI) 25.0-25.9, adult: Secondary | ICD-10-CM

## 2013-01-02 DIAGNOSIS — Z96659 Presence of unspecified artificial knee joint: Secondary | ICD-10-CM

## 2013-01-02 HISTORY — DX: Presence of unspecified artificial knee joint: Z96.659

## 2013-01-02 HISTORY — PX: TOTAL KNEE ARTHROPLASTY: SHX125

## 2013-01-02 SURGERY — ARTHROPLASTY, KNEE, TOTAL
Anesthesia: Spinal | Site: Knee | Laterality: Right | Wound class: Clean

## 2013-01-02 MED ORDER — BUDESONIDE-FORMOTEROL FUMARATE 160-4.5 MCG/ACT IN AERO
2.0000 | INHALATION_SPRAY | Freq: Two times a day (BID) | RESPIRATORY_TRACT | Status: DC
  Administered 2013-01-02 – 2013-01-04 (×4): 2 via RESPIRATORY_TRACT
  Filled 2013-01-02: qty 6

## 2013-01-02 MED ORDER — MENTHOL 3 MG MT LOZG
1.0000 | LOZENGE | OROMUCOSAL | Status: DC | PRN
  Filled 2013-01-02: qty 9

## 2013-01-02 MED ORDER — ASPIRIN EC 325 MG PO TBEC
325.0000 mg | DELAYED_RELEASE_TABLET | Freq: Two times a day (BID) | ORAL | Status: DC
  Administered 2013-01-03 – 2013-01-04 (×3): 325 mg via ORAL
  Filled 2013-01-02 (×5): qty 1

## 2013-01-02 MED ORDER — ZOLPIDEM TARTRATE 5 MG PO TABS: 5.0000 mg | ORAL_TABLET | Freq: Every evening | ORAL | Status: DC | PRN

## 2013-01-02 MED ORDER — FERROUS SULFATE 325 (65 FE) MG PO TABS
325.0000 mg | ORAL_TABLET | Freq: Three times a day (TID) | ORAL | Status: DC
  Administered 2013-01-02 – 2013-01-04 (×5): 325 mg via ORAL
  Filled 2013-01-02 (×8): qty 1

## 2013-01-02 MED ORDER — LORATADINE 10 MG PO TABS
10.0000 mg | ORAL_TABLET | Freq: Every day | ORAL | Status: DC
  Administered 2013-01-02 – 2013-01-04 (×3): 10 mg via ORAL
  Filled 2013-01-02 (×3): qty 1

## 2013-01-02 MED ORDER — BUPIVACAINE IN DEXTROSE 0.75-8.25 % IT SOLN
INTRATHECAL | Status: DC | PRN
  Administered 2013-01-02: 15 mg via INTRATHECAL

## 2013-01-02 MED ORDER — PROPOFOL INFUSION 10 MG/ML OPTIME
INTRAVENOUS | Status: DC | PRN
  Administered 2013-01-02: 50 ug/kg/min via INTRAVENOUS

## 2013-01-02 MED ORDER — BUPIVACAINE HCL (PF) 0.75 % IJ SOLN
INTRAMUSCULAR | Status: DC | PRN
  Administered 2013-01-02: 15 mg

## 2013-01-02 MED ORDER — DEXAMETHASONE SODIUM PHOSPHATE 10 MG/ML IJ SOLN
10.0000 mg | Freq: Once | INTRAMUSCULAR | Status: AC
  Administered 2013-01-03: 10 mg via INTRAVENOUS
  Filled 2013-01-02: qty 1

## 2013-01-02 MED ORDER — METHOCARBAMOL 500 MG PO TABS
500.0000 mg | ORAL_TABLET | Freq: Four times a day (QID) | ORAL | Status: DC | PRN
  Administered 2013-01-02 – 2013-01-03 (×4): 500 mg via ORAL
  Filled 2013-01-02 (×4): qty 1

## 2013-01-02 MED ORDER — BISACODYL 10 MG RE SUPP: 10.0000 mg | Freq: Every day | RECTAL | Status: DC | PRN

## 2013-01-02 MED ORDER — DIPHENHYDRAMINE HCL 25 MG PO CAPS: 25.0000 mg | ORAL_CAPSULE | Freq: Four times a day (QID) | ORAL | Status: DC | PRN

## 2013-01-02 MED ORDER — ALBUTEROL SULFATE HFA 108 (90 BASE) MCG/ACT IN AERS: 2.0000 | INHALATION_SPRAY | Freq: Four times a day (QID) | RESPIRATORY_TRACT | Status: DC | PRN

## 2013-01-02 MED ORDER — HYDROMORPHONE HCL PF 1 MG/ML IJ SOLN
0.5000 mg | INTRAMUSCULAR | Status: DC | PRN
  Administered 2013-01-02: 1 mg via INTRAVENOUS
  Filled 2013-01-02: qty 1

## 2013-01-02 MED ORDER — FLEET ENEMA 7-19 GM/118ML RE ENEM: 1.0000 | ENEMA | Freq: Once | RECTAL | Status: AC | PRN

## 2013-01-02 MED ORDER — MAGNESIUM 250 MG PO TABS: 250.0000 mg | ORAL_TABLET | Freq: Every day | ORAL | Status: DC

## 2013-01-02 MED ORDER — HYDROCODONE-ACETAMINOPHEN 7.5-325 MG PO TABS
1.0000 | ORAL_TABLET | ORAL | Status: DC
  Administered 2013-01-02: 1 via ORAL
  Administered 2013-01-02: 2 via ORAL
  Administered 2013-01-02 – 2013-01-03 (×2): 1 via ORAL
  Administered 2013-01-03 – 2013-01-04 (×7): 2 via ORAL
  Filled 2013-01-02 (×2): qty 2
  Filled 2013-01-02: qty 1
  Filled 2013-01-02 (×4): qty 2
  Filled 2013-01-02: qty 1
  Filled 2013-01-02 (×3): qty 2

## 2013-01-02 MED ORDER — TRANEXAMIC ACID 100 MG/ML IV SOLN
1000.0000 mg | Freq: Once | INTRAVENOUS | Status: AC
  Administered 2013-01-02: 1000 mg via INTRAVENOUS
  Filled 2013-01-02: qty 10

## 2013-01-02 MED ORDER — LACTATED RINGERS IV SOLN: INTRAVENOUS | Status: DC

## 2013-01-02 MED ORDER — POLYETHYLENE GLYCOL 3350 17 G PO PACK
17.0000 g | PACK | Freq: Two times a day (BID) | ORAL | Status: DC
  Administered 2013-01-03 – 2013-01-04 (×2): 17 g via ORAL

## 2013-01-02 MED ORDER — KETOROLAC TROMETHAMINE 30 MG/ML IJ SOLN
INTRAMUSCULAR | Status: DC | PRN
  Administered 2013-01-02: 30 mg

## 2013-01-02 MED ORDER — BUPIVACAINE LIPOSOME 1.3 % IJ SUSP
20.0000 mL | Freq: Once | INTRAMUSCULAR | Status: DC
  Filled 2013-01-02: qty 20

## 2013-01-02 MED ORDER — CEFAZOLIN SODIUM-DEXTROSE 2-3 GM-% IV SOLR
2.0000 g | Freq: Four times a day (QID) | INTRAVENOUS | Status: AC
  Administered 2013-01-02 (×2): 2 g via INTRAVENOUS
  Filled 2013-01-02 (×2): qty 50

## 2013-01-02 MED ORDER — LACTATED RINGERS IV SOLN
INTRAVENOUS | Status: DC
  Administered 2013-01-02: 1000 mL via INTRAVENOUS
  Administered 2013-01-02: 13:00:00 via INTRAVENOUS

## 2013-01-02 MED ORDER — METOCLOPRAMIDE HCL 10 MG PO TABS: 5.0000 mg | ORAL_TABLET | Freq: Three times a day (TID) | ORAL | Status: DC | PRN

## 2013-01-02 MED ORDER — BUPIVACAINE LIPOSOME 1.3 % IJ SUSP
INTRAMUSCULAR | Status: DC | PRN
  Administered 2013-01-02: 20 mL

## 2013-01-02 MED ORDER — DEXAMETHASONE SODIUM PHOSPHATE 10 MG/ML IJ SOLN
10.0000 mg | Freq: Once | INTRAMUSCULAR | Status: AC
  Administered 2013-01-02: 10 mg via INTRAVENOUS

## 2013-01-02 MED ORDER — FLUOXETINE HCL 20 MG PO TABS
20.0000 mg | ORAL_TABLET | Freq: Two times a day (BID) | ORAL | Status: DC
  Administered 2013-01-02 – 2013-01-04 (×4): 20 mg via ORAL
  Filled 2013-01-02 (×5): qty 1

## 2013-01-02 MED ORDER — SODIUM CHLORIDE 0.9 % IJ SOLN
INTRAMUSCULAR | Status: DC | PRN
  Administered 2013-01-02: 50 mL

## 2013-01-02 MED ORDER — MIDAZOLAM HCL 5 MG/5ML IJ SOLN
INTRAMUSCULAR | Status: DC | PRN
  Administered 2013-01-02 (×2): 1 mg via INTRAVENOUS

## 2013-01-02 MED ORDER — METHOCARBAMOL 100 MG/ML IJ SOLN
500.0000 mg | Freq: Four times a day (QID) | INTRAVENOUS | Status: DC | PRN
  Filled 2013-01-02: qty 5

## 2013-01-02 MED ORDER — FLUTICASONE PROPIONATE 50 MCG/ACT NA SUSP
2.0000 | Freq: Every day | NASAL | Status: DC | PRN
  Filled 2013-01-02: qty 16

## 2013-01-02 MED ORDER — HYDROMORPHONE HCL PF 1 MG/ML IJ SOLN: 0.2500 mg | INTRAMUSCULAR | Status: DC | PRN

## 2013-01-02 MED ORDER — AMLODIPINE BESYLATE 5 MG PO TABS
5.0000 mg | ORAL_TABLET | Freq: Every day | ORAL | Status: DC
Start: 2013-01-02 — End: 2013-01-04
  Administered 2013-01-02 – 2013-01-04 (×2): 5 mg via ORAL
  Filled 2013-01-02 (×3): qty 1

## 2013-01-02 MED ORDER — PHENOL 1.4 % MT LIQD
1.0000 | OROMUCOSAL | Status: DC | PRN
  Filled 2013-01-02: qty 177

## 2013-01-02 MED ORDER — CEFAZOLIN SODIUM-DEXTROSE 2-3 GM-% IV SOLR
2.0000 g | INTRAVENOUS | Status: AC
  Administered 2013-01-02: 2 g via INTRAVENOUS

## 2013-01-02 MED ORDER — METOCLOPRAMIDE HCL 5 MG/ML IJ SOLN: 5.0000 mg | Freq: Three times a day (TID) | INTRAMUSCULAR | Status: DC | PRN

## 2013-01-02 MED ORDER — POTASSIUM CHLORIDE 2 MEQ/ML IV SOLN
INTRAVENOUS | Status: DC
  Administered 2013-01-02 – 2013-01-03 (×2): via INTRAVENOUS
  Filled 2013-01-02 (×6): qty 1000

## 2013-01-02 MED ORDER — MAGNESIUM OXIDE 400 (241.3 MG) MG PO TABS
200.0000 mg | ORAL_TABLET | Freq: Every day | ORAL | Status: DC
  Administered 2013-01-03 – 2013-01-04 (×2): 200 mg via ORAL
  Filled 2013-01-02 (×2): qty 0.5

## 2013-01-02 MED ORDER — SODIUM CHLORIDE 0.9 % IR SOLN
Status: DC | PRN
  Administered 2013-01-02: 1000 mL

## 2013-01-02 MED ORDER — BUPIVACAINE-EPINEPHRINE PF 0.25-1:200000 % IJ SOLN
INTRAMUSCULAR | Status: DC | PRN
  Administered 2013-01-02: 30 mL

## 2013-01-02 MED ORDER — DOCUSATE SODIUM 100 MG PO CAPS
100.0000 mg | ORAL_CAPSULE | Freq: Two times a day (BID) | ORAL | Status: DC
  Administered 2013-01-02 – 2013-01-04 (×4): 100 mg via ORAL

## 2013-01-02 MED ORDER — ONDANSETRON HCL 4 MG PO TABS: 4.0000 mg | ORAL_TABLET | Freq: Four times a day (QID) | ORAL | Status: DC | PRN

## 2013-01-02 MED ORDER — ONDANSETRON HCL 4 MG/2ML IJ SOLN: 4.0000 mg | Freq: Four times a day (QID) | INTRAMUSCULAR | Status: DC | PRN

## 2013-01-02 MED ORDER — 0.9 % SODIUM CHLORIDE (POUR BTL) OPTIME
TOPICAL | Status: DC | PRN
  Administered 2013-01-02: 1000 mL

## 2013-01-02 MED ORDER — ACETAMINOPHEN 10 MG/ML IV SOLN
INTRAVENOUS | Status: DC | PRN
  Administered 2013-01-02: 1000 mg via INTRAVENOUS

## 2013-01-02 MED ORDER — ALUM & MAG HYDROXIDE-SIMETH 200-200-20 MG/5ML PO SUSP: 30.0000 mL | ORAL | Status: DC | PRN

## 2013-01-02 SURGICAL SUPPLY — 58 items
BAG ZIPLOCK 12X15 (MISCELLANEOUS) ×2 IMPLANT
BANDAGE ELASTIC 6 VELCRO ST LF (GAUZE/BANDAGES/DRESSINGS) ×2 IMPLANT
BANDAGE ESMARK 6X9 LF (GAUZE/BANDAGES/DRESSINGS) ×1 IMPLANT
BLADE SAW SGTL 13.0X1.19X90.0M (BLADE) ×2 IMPLANT
BNDG ESMARK 6X9 LF (GAUZE/BANDAGES/DRESSINGS) ×2
BOWL SMART MIX CTS (DISPOSABLE) ×2 IMPLANT
CEMENT HV SMART SET (Cement) ×4 IMPLANT
CLOTH BEACON ORANGE TIMEOUT ST (SAFETY) ×2 IMPLANT
CUFF TOURN SGL QUICK 34 (TOURNIQUET CUFF) ×1
CUFF TRNQT CYL 34X4X40X1 (TOURNIQUET CUFF) ×1 IMPLANT
DECANTER SPIKE VIAL GLASS SM (MISCELLANEOUS) ×2 IMPLANT
DERMABOND ADVANCED (GAUZE/BANDAGES/DRESSINGS) ×1
DERMABOND ADVANCED .7 DNX12 (GAUZE/BANDAGES/DRESSINGS) ×1 IMPLANT
DRAPE EXTREMITY T 121X128X90 (DRAPE) ×2 IMPLANT
DRAPE POUCH INSTRU U-SHP 10X18 (DRAPES) ×2 IMPLANT
DRAPE U-SHAPE 47X51 STRL (DRAPES) ×2 IMPLANT
DRSG AQUACEL AG ADV 3.5X10 (GAUZE/BANDAGES/DRESSINGS) ×2 IMPLANT
DRSG TEGADERM 4X4.75 (GAUZE/BANDAGES/DRESSINGS) ×2 IMPLANT
DURAPREP 26ML APPLICATOR (WOUND CARE) ×2 IMPLANT
ELECT REM PT RETURN 9FT ADLT (ELECTROSURGICAL) ×2
ELECTRODE REM PT RTRN 9FT ADLT (ELECTROSURGICAL) ×1 IMPLANT
EVACUATOR 1/8 PVC DRAIN (DRAIN) ×2 IMPLANT
FACESHIELD LNG OPTICON STERILE (SAFETY) ×10 IMPLANT
GAUZE SPONGE 2X2 8PLY STRL LF (GAUZE/BANDAGES/DRESSINGS) ×1 IMPLANT
GLOVE BIO SURGEON STRL SZ 6 (GLOVE) ×2 IMPLANT
GLOVE BIOGEL PI IND STRL 6 (GLOVE) ×1 IMPLANT
GLOVE BIOGEL PI IND STRL 7.5 (GLOVE) ×2 IMPLANT
GLOVE BIOGEL PI IND STRL 8 (GLOVE) ×1 IMPLANT
GLOVE BIOGEL PI INDICATOR 6 (GLOVE) ×1
GLOVE BIOGEL PI INDICATOR 7.5 (GLOVE) ×2
GLOVE BIOGEL PI INDICATOR 8 (GLOVE) ×1
GLOVE ECLIPSE 8.0 STRL XLNG CF (GLOVE) ×2 IMPLANT
GLOVE ORTHO TXT STRL SZ7.5 (GLOVE) ×4 IMPLANT
GLOVE SURG SS PI 7.5 STRL IVOR (GLOVE) ×2 IMPLANT
GOWN BRE IMP PREV XXLGXLNG (GOWN DISPOSABLE) ×4 IMPLANT
GOWN STRL NON-REIN LRG LVL3 (GOWN DISPOSABLE) ×4 IMPLANT
HANDPIECE INTERPULSE COAX TIP (DISPOSABLE) ×1
KIT BASIN OR (CUSTOM PROCEDURE TRAY) ×2 IMPLANT
MANIFOLD NEPTUNE II (INSTRUMENTS) ×2 IMPLANT
NDL SAFETY ECLIPSE 18X1.5 (NEEDLE) ×1 IMPLANT
NEEDLE HYPO 18GX1.5 SHARP (NEEDLE) ×1
NS IRRIG 1000ML POUR BTL (IV SOLUTION) ×4 IMPLANT
PACK TOTAL JOINT (CUSTOM PROCEDURE TRAY) ×2 IMPLANT
POSITIONER SURGICAL ARM (MISCELLANEOUS) ×2 IMPLANT
SET HNDPC FAN SPRY TIP SCT (DISPOSABLE) ×1 IMPLANT
SET PAD KNEE POSITIONER (MISCELLANEOUS) ×2 IMPLANT
SPONGE GAUZE 2X2 STER 10/PKG (GAUZE/BANDAGES/DRESSINGS) ×1
SUCTION FRAZIER 12FR DISP (SUCTIONS) ×2 IMPLANT
SUT MNCRL AB 4-0 PS2 18 (SUTURE) ×2 IMPLANT
SUT VIC AB 1 CT1 36 (SUTURE) ×2 IMPLANT
SUT VIC AB 2-0 CT1 27 (SUTURE) ×3
SUT VIC AB 2-0 CT1 TAPERPNT 27 (SUTURE) ×3 IMPLANT
SUT VLOC 180 0 24IN GS25 (SUTURE) ×2 IMPLANT
SYR 50ML LL SCALE MARK (SYRINGE) ×2 IMPLANT
TOWEL OR 17X26 10 PK STRL BLUE (TOWEL DISPOSABLE) ×4 IMPLANT
TRAY FOLEY CATH 14FRSI W/METER (CATHETERS) ×2 IMPLANT
WATER STERILE IRR 1500ML POUR (IV SOLUTION) ×4 IMPLANT
WRAP KNEE MAXI GEL POST OP (GAUZE/BANDAGES/DRESSINGS) ×2 IMPLANT

## 2013-01-02 NOTE — Anesthesia Preprocedure Evaluation (Signed)
Anesthesia Evaluation  Patient identified by MRN, date of birth, ID band Patient awake    Reviewed: Allergy & Precautions, H&P , NPO status , Patient's Chart, lab work & pertinent test results, reviewed documented beta blocker date and time   History of Anesthesia Complications (+) PONV  Airway Mallampati: II TM Distance: >3 FB Neck ROM: full    Dental no notable dental hx.    Pulmonary pneumonia -, resolved, COPD COPD inhaler, Current Smoker,  History lung cancer.  Moderate COPD. Acute bronchitis breath sounds clear to auscultation  Pulmonary exam normal       Cardiovascular hypertension, Pt. on medications and Pt. on home beta blockers Rhythm:regular Rate:Normal     Neuro/Psych negative neurological ROS  negative psych ROS   GI/Hepatic negative GI ROS, Neg liver ROS,   Endo/Other  negative endocrine ROS  Renal/GU negative Renal ROS  negative genitourinary   Musculoskeletal   Abdominal   Peds  Hematology negative hematology ROS (+)   Anesthesia Other Findings   Reproductive/Obstetrics negative OB ROS                           Anesthesia Physical Anesthesia Plan  ASA: III  Anesthesia Plan: Spinal   Post-op Pain Management:    Induction:   Airway Management Planned: Simple Face Mask  Additional Equipment:   Intra-op Plan:   Post-operative Plan:   Informed Consent: I have reviewed the patients History and Physical, chart, labs and discussed the procedure including the risks, benefits and alternatives for the proposed anesthesia with the patient or authorized representative who has indicated his/her understanding and acceptance.   Dental Advisory Given  Plan Discussed with: CRNA and Surgeon  Anesthesia Plan Comments:         Anesthesia Quick Evaluation

## 2013-01-02 NOTE — Preoperative (Signed)
Beta Blockers   Reason not to administer Beta Blockers:Not Applicable 

## 2013-01-02 NOTE — Plan of Care (Signed)
Problem: Consults Goal: Diagnosis- Total Joint Replacement Primary Total Knee     

## 2013-01-02 NOTE — Interval H&P Note (Signed)
History and Physical Interval Note:  01/02/2013 10:04 AM  [name]  has presented today for surgery, with the diagnosis of RIGHT KNEE OA   The various methods of treatment have been discussed with the patient and family. After consideration of risks, benefits and other options for treatment, the patient has consented to  Procedure(s): RIGHT TOTAL KNEE ARTHROPLASTY (Right) as a surgical intervention .  The patient's history has been reviewed, patient examined, no change in status, stable for surgery.  I have reviewed the patient's chart and labs.  Questions were answered to the patient's satisfaction.     [name]

## 2013-01-02 NOTE — Progress Notes (Signed)
Utilization review completed.  

## 2013-01-02 NOTE — Anesthesia Procedure Notes (Signed)
Spinal  Patient location during procedure: OR Start time: 01/02/2013 11:40 AM End time: 01/02/2013 11:45 AM Staffing Anesthesiologist: Ronelle Nigh L Performed by: anesthesiologist  Preanesthetic Checklist Completed: patient identified, site marked, surgical consent, pre-op evaluation, timeout performed, IV checked, risks and benefits discussed and monitors and equipment checked Spinal Block Patient position: right lateral decubitus Prep: Betadine Patient monitoring: heart rate, continuous pulse ox and blood pressure Approach: right paramedian Location: L3-4 Injection technique: single-shot Needle Needle type: Spinocan  Needle gauge: 22 G Needle length: 9 cm Assessment Sensory level: T6 Additional Notes Expiration date of kit checked and confirmed. Patient tolerated procedure well, without complications.  Did midline spinal with 22 ga needle and got great fluid return and aspiration.  Seemed superficial though.  Injected 15 mg bupivicaine.  After 15 minutes the spinal was not working, so I presumed that I was in the pocket of lidocaine I had injected for the local rather than the subarachnoid space.  I turned the patient lateral and repeated the spinal as per above procedure note.  This time the subarachnoid space was much deeper.  I used a paramedian technique so I could gauge the depth by walking off the lamina.

## 2013-01-02 NOTE — Op Note (Signed)
NAME:  [name]                      MEDICAL RECORD NO.:  [id number]                             FACILITY:  [facility]      PHYSICIAN:  Madlyn Frankel. Charlann Boxer, M.D.  DATE OF BIRTH:  11-May-1941      DATE OF PROCEDURE:  01/02/2013                                     OPERATIVE REPORT         PREOPERATIVE DIAGNOSIS:  Right knee osteoarthritis.      POSTOPERATIVE DIAGNOSIS:  Right knee osteoarthritis.      FINDINGS:  The patient was noted to have complete loss of cartilage and   bone-on-bone arthritis with associated osteophytes in all three compartments of   the knee with a significant synovitis and associated effusion.      PROCEDURE:  Right total knee replacement.      COMPONENTS USED:  DePuy rotating platform posterior stabilized knee   system, a size 3 femur, 2.5 tibia, 10 mm PS insert, and 35 patellar   button.      SURGEON:  Madlyn Frankel. Charlann Boxer, M.D.      ASSISTANT:  Lanney Gins, PA-C.      ANESTHESIA:  Spinal.      SPECIMENS:  None.      COMPLICATION:  None.      DRAINS:  One Hemovac.  EBL: <150cc      TOURNIQUET TIME:   Total Tourniquet Time Documented: Thigh (Right) - 30 minutes Total: Thigh (Right) - 30 minutes  .      The patient was stable to the recovery room.      INDICATION FOR PROCEDURE:  [name] is a 72 y.o. female patient of   mine.  The patient had been seen, evaluated, and treated conservatively in the   office with medication, activity modification, and injections.  The patient had   radiographic changes of bone-on-bone arthritis with endplate sclerosis and osteophytes noted.      The patient failed conservative measures including medication, injections, and activity modification, and at this point was ready for more definitive measures.   Based on the radiographic changes and failed conservative measures, the patient   decided to proceed with total knee replacement.  Risks of infection,   DVT, component failure, need for revision surgery,  postop course, and   expectations were all   discussed and reviewed.  Consent was obtained for benefit of pain   relief.      PROCEDURE IN DETAIL:  The patient was brought to the operative theater.   Once adequate anesthesia, preoperative antibiotics, 2 gm of Ancef administered, the patient was positioned supine with the right thigh tourniquet placed.  The  right lower extremity was prepped and draped in sterile fashion.  A time-   out was performed identifying the patient, planned procedure, and   extremity.      The right lower extremity was placed in the [facility] leg holder.  The leg was   exsanguinated, tourniquet elevated to 250 mmHg.  A midline incision was   made followed by median parapatellar arthrotomy.  Following initial   exposure, attention was first directed  to the patella.  Precut   measurement was noted to be 21 mm.  I resected down to 13 mm and used a   35 patellar button to restore patellar height to 22-76mm as well as cover the cut   surface.      The lug holes were drilled and a metal shim was placed to protect the   patella from retractors and saw blades.      At this point, attention was now directed to the femur.  The femoral   canal was opened with a drill, irrigated to try to prevent fat emboli.  An   intramedullary rod was passed at 5 degrees valgus, 10 mm of bone was   resected off the distal femur.  Following this resection, the tibia was   subluxated anteriorly.  Using the extramedullary guide, 4 mm of bone was resected off   the proximal lateral tibia.  We confirmed the gap would be   stable medially and laterally with a 10 mm insert as well as confirmed   the cut was perpendicular in the coronal plane, checking with an alignment rod.      Once this was done, I sized the femur to be a size 3 in the anterior-   posterior dimension, chose a standard component based on medial and   lateral dimension.  The size 3 rotation block was then pinned in   position  anterior referenced using the C-clamp to set rotation.  The   anterior, posterior, and  chamfer cuts were made without difficulty nor   notching making certain that I was along the anterior cortex to help   with flexion gap stability.      The final box cut was made off the lateral aspect of distal femur.      At this point, the tibia was sized to be a size 2.5, the size 2.5 tray was   then pinned in position through the medial third of the tubercle,   drilled, and keel punched.  Trial reduction was now carried with a 3 femur,  2.5 tibia, a 10 mm insert, and the 35 patella botton.  The knee was brought to   extension, full extension with good flexion stability with the patella   tracking through the trochlea without application of pressure.  Given   all these findings, the trial components removed.  Final components were   opened and cement was mixed.  The knee was irrigated with normal saline   solution and pulse lavage.  The synovial lining was   then injected with 0.25% Marcaine with epinephrine and 1 cc of Toradol,   total of 61 cc.      The knee was irrigated.  Final implants were then cemented onto clean and   dried cut surfaces of bone with the knee brought to extension with a 10mm trial insert.      Once the cement had fully cured, the excess cement was removed   throughout the knee.  I confirmed I was satisfied with the range of   motion and stability, and the final 10 mm PS insert was chosen.  It was   placed into the knee.      The tourniquet had been let down at 30 minutes.  No significant   hemostasis required.  The medium Hemovac drain was placed deep.  The   extensor mechanism was then reapproximated using #1 Vicryl with the knee   in flexion.  The  remaining wound was closed with 2-0 Vicryl and running 4-0 Monocryl.   The knee was cleaned, dried, dressed sterilely using Dermabond and   Aquacel dressing.  Drain site dressed separately.  The patient was then   brought  to recovery room in stable condition, tolerating the procedure   well.   Please note that Physician Assistant, Lanney Gins, was present for the entirety of the case, and was utilized for pre-operative positioning, peri-operative retractor management, general facilitation of the procedure.  He was also utilized for primary wound closure at the end of the case.              Madlyn Frankel Charlann Boxer, M.D.

## 2013-01-02 NOTE — Transfer of Care (Signed)
Immediate Anesthesia Transfer of Care Note  Patient: [name]  Procedure(s) Performed: Procedure(s): RIGHT TOTAL KNEE ARTHROPLASTY (Right)  Patient Location: PACU  Anesthesia Type:Regional  Level of Consciousness: awake, alert  and oriented  Airway & Oxygen Therapy: Patient Spontanous Breathing and Patient connected to face mask oxygen  Post-op Assessment: Report given to PACU RN and Post -op Vital signs reviewed and stable  Post vital signs: Reviewed and stable  Complications: No apparent anesthesia complications

## 2013-01-02 NOTE — Anesthesia Postprocedure Evaluation (Signed)
  Anesthesia Post-op Note  Patient: [name]  Procedure(s) Performed: Procedure(s) (LRB): RIGHT TOTAL KNEE ARTHROPLASTY (Right)  Patient Location: PACU  Anesthesia Type: Spinal  Level of Consciousness: awake and alert   Airway and Oxygen Therapy: Patient Spontanous Breathing  Post-op Pain: mild  Post-op Assessment: Post-op Vital signs reviewed, Patient's Cardiovascular Status Stable, Respiratory Function Stable, Patent Airway and No signs of Nausea or vomiting  Last Vitals:  Filed Vitals:   01/02/13 1430  BP:   Pulse: 59  Temp: 36.7 C  Resp: 11    Post-op Vital Signs: stable   Complications: No apparent anesthesia complications

## 2013-01-03 ENCOUNTER — Encounter (HOSPITAL_COMMUNITY): Payer: Self-pay | Admitting: Orthopedic Surgery

## 2013-01-03 DIAGNOSIS — E663 Overweight: Secondary | ICD-10-CM

## 2013-01-03 LAB — CBC
HCT: 35.6 % — ABNORMAL LOW (ref 36.0–46.0)
MCHC: 34 g/dL (ref 30.0–36.0)
Platelets: 171 10*3/uL (ref 150–400)
RDW: 12.3 % (ref 11.5–15.5)
WBC: 11.8 10*3/uL — ABNORMAL HIGH (ref 4.0–10.5)

## 2013-01-03 LAB — BASIC METABOLIC PANEL
BUN: 12 mg/dL (ref 6–23)
Chloride: 100 mEq/L (ref 96–112)
GFR calc Af Amer: >90 mL/min (ref >90)
GFR calc non Af Amer: 87 mL/min — ABNORMAL LOW (ref >90)
Potassium: 3.7 mEq/L (ref 3.5–5.1)
Sodium: 135 mEq/L (ref 135–145)

## 2013-01-03 NOTE — Evaluation (Signed)
Physical Therapy Evaluation Patient Details Name: [name] MRN: [id number] DOB: 1941-07-24 Today's Date: 01/03/2013 Time: 1191-4782 PT Time Calculation (min): 43 min  PT Assessment / Plan / Recommendation Clinical Impression  Pt s/p R TKR presents with decreased R LE strength/ROM and post op pain and nausea limiting functional mobility    PT Assessment  Patient needs continued PT services    Follow Up Recommendations  Home health PT    Does the patient have the potential to tolerate intense rehabilitation      Barriers to Discharge None      Equipment Recommendations  None recommended by PT    Recommendations for Other Services OT consult   Frequency 7X/week    Precautions / Restrictions Restrictions Weight Bearing Restrictions: No   Pertinent Vitals/Pain 6/10; premed, ice packs provided      Mobility  Bed Mobility Bed Mobility: Supine to Sit Supine to Sit: 4: Min assist Details for Bed Mobility Assistance: cues for sequence Transfers Transfers: Sit to Stand;Stand to Sit Sit to Stand: 4: Min assist Stand to Sit: 4: Min assist Details for Transfer Assistance: cues for LE management and use of UEs to self asssist Ambulation/Gait Ambulation/Gait Assistance: 4: Min assist;3: Mod assist Ambulation Distance (Feet): 6 Feet Assistive device: Rolling walker Ambulation/Gait Assistance Details: cues for posture, sequence,  and position from RW Gait Pattern: Step-to pattern;Decreased step length - right;Decreased step length - left;Decreased stance time - right General Gait Details: ltd by c/o nausea Stairs: No    Exercises Total Joint Exercises Ankle Circles/Pumps: AROM;Both;10 reps;Supine Quad Sets: AROM;Both;10 reps;Supine Heel Slides: AAROM;15 reps;Supine;Right Straight Leg Raises: AROM;AAROM;Right;15 reps;Supine   PT Diagnosis: Difficulty walking  PT Problem List: Decreased strength;Decreased range of motion;Decreased activity tolerance;Decreased  mobility;Decreased knowledge of use of DME;Pain PT Treatment Interventions: DME instruction;Gait training;Therapeutic activities;Functional mobility training;Stair training;Therapeutic exercise;Patient/family education   PT Goals Acute Rehab PT Goals PT Goal Formulation: With patient Time For Goal Achievement: 01/10/13 Potential to Achieve Goals: Good Pt will go Supine/Side to Sit: with supervision PT Goal: Supine/Side to Sit - Progress: Goal set today Pt will go Sit to Supine/Side: with supervision PT Goal: Sit to Supine/Side - Progress: Goal set today Pt will go Sit to Stand: with supervision PT Goal: Sit to Stand - Progress: Goal set today Pt will go Stand to Sit: with supervision PT Goal: Stand to Sit - Progress: Goal set today Pt will Ambulate: 51 - 150 feet;with supervision;with rolling walker PT Goal: Ambulate - Progress: Goal set today Pt will Go Up / Down Stairs: 3-5 stairs;with min assist;with least restrictive assistive device PT Goal: Up/Down Stairs - Progress: Goal set today  Visit Information  Last PT Received On: 01/03/13 Assistance Needed: +2    Subjective Data  Subjective: I'm ready  Patient Stated Goal: Resume previous lifestyle with decreased pain.   Prior Functioning  Home Living Lives With: Spouse Available Help at Discharge: Family Type of Home: House Home Access: Stairs to enter Secretary/administrator of Steps: 4 Entrance Stairs-Rails: Right Home Adaptive Equipment: Walker - rolling Prior Function Level of Independence: Independent Able to Take Stairs?: Yes Driving: Yes Vocation: Retired Musician: No difficulties Dominant Hand: Right    Cognition  Cognition Arousal/Alertness: Awake/alert Behavior During Therapy: WFL for tasks assessed/performed Overall Cognitive Status: Within Functional Limits for tasks assessed    Extremity/Trunk Assessment Right Upper Extremity Assessment RUE ROM/Strength/Tone: [facility] for tasks  assessed Left Upper Extremity Assessment LUE ROM/Strength/Tone: [facility] for tasks assessed Right Lower Extremity Assessment RLE  ROM/Strength/Tone: Deficits RLE ROM/Strength/Tone Deficits: 3/5 quads with AAROM -8 - 90  Left Lower Extremity Assessment LLE ROM/Strength/Tone: WFL for tasks assessed Trunk Assessment Trunk Assessment: Normal   Balance    End of Session PT - End of Session Equipment Utilized During Treatment: Gait belt Activity Tolerance: Patient tolerated treatment well Patient left: in chair;with call bell/phone within reach Nurse Communication: Mobility status  GP     Navin Dogan 01/03/2013, 1:39 PM

## 2013-01-03 NOTE — Progress Notes (Signed)
   Subjective: 1 Day Post-Op Procedure(s) (LRB): RIGHT TOTAL KNEE ARTHROPLASTY (Right)   Patient reports pain as mild, pain well controlled. No events throughout the night.  Objective:   VITALS:   Filed Vitals:   01/03/13 0559  BP: 150/78  Pulse: 63  Temp: 98 F (36.7 C)  Resp: 14    Neurovascular intact Dorsiflexion/Plantar flexion intact Incision: dressing C/D/I No cellulitis present Compartment soft  LABS  Recent Labs  01/03/13 0435  HGB 12.1  HCT 35.6*  WBC 11.8*  PLT 171     Recent Labs  01/03/13 0435  NA 135  K 3.7  BUN 12  CREATININE 0.69  GLUCOSE 114*     Assessment/Plan: 1 Day Post-Op Procedure(s) (LRB): RIGHT TOTAL KNEE ARTHROPLASTY (Right) HV drain d/c'ed Foley cath d/c'ed Advance diet Up with therapy D/C IV fluids Discharge home with home health eventually when ready, possibly tomorrow  Overweight (BMI 25-29.9) Estimated body mass index is 26.15 kg/(m^2) as calculated from the following:   Height as of this encounter: 5\' 2"  (1.575 m).   Weight as of this encounter: 64.864 kg (143 lb). Patient also counseled that weight may inhibit the healing process Patient counseled that losing weight will help with future health issues      Anastasio Auerbach. Armari Fussell   PAC  01/03/2013, 9:36 AM

## 2013-01-03 NOTE — Progress Notes (Signed)
Physical Therapy Treatment Patient Details Name: [name] MRN: [id number] DOB: 09-27-1940 Today's Date: 01/03/2013 Time: 1236-1300 PT Time Calculation (min): 24 min  PT Assessment / Plan / Recommendation Comments on Treatment Session  Pt to/from bathroom    Follow Up Recommendations  Home health PT     Does the patient have the potential to tolerate intense rehabilitation     Barriers to Discharge None      Equipment Recommendations  None recommended by PT    Recommendations for Other Services OT consult  Frequency 7X/week   Plan Discharge plan remains appropriate    Precautions / Restrictions Precautions Precautions: Fall Restrictions Weight Bearing Restrictions: No Other Position/Activity Restrictions: WBAT   Pertinent Vitals/Pain     Mobility  Bed Mobility Bed Mobility: Sit to Supine Supine to Sit: 4: Min assist Sit to Supine: 4: Min assist Details for Bed Mobility Assistance: cues for sequence Transfers Transfers: Sit to Stand;Stand to Sit Sit to Stand: 4: Min assist Stand to Sit: 4: Min assist Details for Transfer Assistance: cues for LE management and use of UEs to self asssist Ambulation/Gait Ambulation/Gait Assistance: 4: Min assist Ambulation Distance (Feet): 25 Feet (twice) Assistive device: Rolling walker Ambulation/Gait Assistance Details: cues for posture, position from RW and sequence Gait Pattern: Step-to pattern;Decreased step length - right;Decreased step length - left;Decreased stance time - right General Gait Details: ltd by c/o nausea Stairs: No    Exercises Total Joint Exercises Ankle Circles/Pumps: AROM;Both;10 reps;Supine Quad Sets: AROM;Both;10 reps;Supine Heel Slides: AAROM;15 reps;Supine;Right Straight Leg Raises: AROM;AAROM;Right;15 reps;Supine   PT Diagnosis: Difficulty walking  PT Problem List: Decreased strength;Decreased range of motion;Decreased activity tolerance;Decreased mobility;Decreased knowledge of use of  DME;Pain PT Treatment Interventions: DME instruction;Gait training;Therapeutic activities;Functional mobility training;Stair training;Therapeutic exercise;Patient/family education   PT Goals Acute Rehab PT Goals PT Goal Formulation: With patient Time For Goal Achievement: 01/10/13 Potential to Achieve Goals: Good Pt will go Supine/Side to Sit: with supervision PT Goal: Supine/Side to Sit - Progress: Goal set today Pt will go Sit to Supine/Side: with supervision PT Goal: Sit to Supine/Side - Progress: Goal set today Pt will go Sit to Stand: with supervision PT Goal: Sit to Stand - Progress: Goal set today Pt will go Stand to Sit: with supervision PT Goal: Stand to Sit - Progress: Goal set today Pt will Ambulate: 51 - 150 feet;with supervision;with rolling walker PT Goal: Ambulate - Progress: Goal set today Pt will Go Up / Down Stairs: 3-5 stairs;with min assist;with least restrictive assistive device PT Goal: Up/Down Stairs - Progress: Goal set today  Visit Information  Last PT Received On: 01/03/13 Assistance Needed: +1    Subjective Data  Subjective: I just feel really shaky Patient Stated Goal: Resume previous lifestyle with decreased pain.   Cognition  Cognition Arousal/Alertness: Awake/alert Behavior During Therapy: WFL for tasks assessed/performed Overall Cognitive Status: Within Functional Limits for tasks assessed    Balance     End of Session PT - End of Session Equipment Utilized During Treatment: Gait belt Activity Tolerance: Patient tolerated treatment well Patient left: in bed;with call bell/phone within reach Nurse Communication: Mobility status   GP     Tharon Bomar 01/03/2013, 1:45 PM

## 2013-01-03 NOTE — Progress Notes (Signed)
Occupational Therapy Evaluation Patient Details Name: [name] MRN: [id number] DOB: 07-28-1941 Today's Date: 01/03/2013 Time: 4098-1191 OT Time Calculation (min): 19 min  OT Assessment / Plan / Recommendation Clinical Impression  s/p R TKA. PTA, pt independent with all ADL and mobility. Pt lives with husband who can provide 24/7 assistance at d/C. Pt has all rec DME. All OT education completed.     OT Assessment  Patient does not need any further OT services    Follow Up Recommendations  No OT follow up    Barriers to Discharge      Equipment Recommendations  None recommended by OT    Recommendations for Other Services    Frequency    eval only   Precautions / Restrictions Precautions Precautions: Fall Restrictions Weight Bearing Restrictions: No Other Position/Activity Restrictions: WBAT   Pertinent Vitals/Pain 6. Knee. Premedicated. Ice applied    ADL  Grooming: Supervision/safety Where Assessed - Grooming: Unsupported standing Upper Body Bathing: Supervision/safety;Set up Where Assessed - Upper Body Bathing: Unsupported sitting Lower Body Bathing: Minimal assistance Where Assessed - Lower Body Bathing: Unsupported sit to stand Upper Body Dressing: Supervision/safety;Set up Where Assessed - Upper Body Dressing: Unsupported sitting Lower Body Dressing: Minimal assistance Where Assessed - Lower Body Dressing: Unsupported sit to stand Toilet Transfer: Min guard Toilet Transfer Method: Other (comment) (ambulating) Toilet Transfer Equipment: Bedside commode;Other (comment) (over toilet) Toileting - Clothing Manipulation and Hygiene: Modified independent Where Assessed - Toileting Clothing Manipulation and Hygiene: Sit to stand from 3-in-1 or toilet Tub/Shower Transfer:  (Educated pt/family on shower transfer technique) Equipment Used: Gait belt;Rolling walker Transfers/Ambulation Related to ADLs: minguard ADL Comments: Educated pt/family on compensatory  techniques for ADL and recommended use of BSC for shower and over toilet. Pt has all nec DME.    OT Diagnosis:    OT Problem List:   OT Treatment Interventions:     OT Goals Acute Rehab OT Goals Time For Goal Achievement:  (eval only)  Visit Information  Last OT Received On: 01/03/13 Assistance Needed: +1    Subjective Data      Prior Functioning     Home Living Lives With: Spouse Available Help at Discharge: Family Type of Home: House Home Access: Stairs to enter Secretary/administrator of Steps: 4 Entrance Stairs-Rails: Right Home Layout: One level Bathroom Shower/Tub: Health visitor: Handicapped height Bathroom Accessibility: Yes How Accessible: Accessible via walker Home Adaptive Equipment: Walker - rolling;Bedside commode/3-in-1;Shower chair with back;Wheelchair - Doctor, general practice Prior Function Level of Independence: Independent Able to Take Stairs?: Yes Driving: Yes Vocation: Retired Musician: No difficulties Dominant Hand: Right         Vision/Perception     Cognition  Cognition Arousal/Alertness: Awake/alert Behavior During Therapy: WFL for tasks assessed/performed Overall Cognitive Status: Within Functional Limits for tasks assessed    Extremity/Trunk Assessment Right Upper Extremity Assessment RUE ROM/Strength/Tone: WFL for tasks assessed Left Upper Extremity Assessment LUE ROM/Strength/Tone: WFL for tasks assessed Right Lower Extremity Assessment RLE ROM/Strength/Tone: Deficits RLE ROM/Strength/Tone Deficits: 3/5 quads with AAROM -8 - 90  Left Lower Extremity Assessment LLE ROM/Strength/Tone: WFL for tasks assessed Trunk Assessment Trunk Assessment: Normal     Mobility Bed Mobility Bed Mobility: Sit to Supine Sit to Supine: 4: Min assist Details for Bed Mobility Assistance: cues for sequence Transfers Transfers: Sit to Stand;Stand to Sit Sit to Stand: 4: Min guard;With upper extremity  assist;From toilet Stand to Sit: 5: Supervision;With upper extremity assist;To chair/3-in-1 Details for Transfer Assistance: good carry over  from earlier PT session     Exercise     Balance Balance Balance Assessed:  (S RW level)   End of Session OT - End of Session Equipment Utilized During Treatment: Gait belt Activity Tolerance: Patient tolerated treatment well Patient left: in chair;with call bell/phone within reach;with family/visitor present Nurse Communication: Mobility status  GO     Shiya Fogelman,HILLARY 01/03/2013, 4:24 PM [facility], OTR/L  [phone] 01/03/2013

## 2013-01-03 NOTE — Progress Notes (Signed)
Received orders for rw and commode.  Spoke with patient and she already has both at home.  No DME needs at this time.

## 2013-01-04 LAB — BASIC METABOLIC PANEL
BUN: 12 mg/dL (ref 6–23)
CO2: 27 mEq/L (ref 19–32)
Chloride: 96 mEq/L (ref 96–112)
GFR calc non Af Amer: 89 mL/min — ABNORMAL LOW (ref >90)
Glucose, Bld: 118 mg/dL — ABNORMAL HIGH (ref 70–99)
Potassium: 3.7 mEq/L (ref 3.5–5.1)
Sodium: 132 mEq/L — ABNORMAL LOW (ref 135–145)

## 2013-01-04 LAB — CBC
HCT: 34 % — ABNORMAL LOW (ref 36.0–46.0)
Hemoglobin: 11.1 g/dL — ABNORMAL LOW (ref 12.0–15.0)
MCHC: 32.6 g/dL (ref 30.0–36.0)
RBC: 3.68 MIL/uL — ABNORMAL LOW (ref 3.87–5.11)
WBC: 11.8 10*3/uL — ABNORMAL HIGH (ref 4.0–10.5)

## 2013-01-04 MED ORDER — HYDROCODONE-ACETAMINOPHEN 7.5-325 MG PO TABS: 1.0000 | ORAL_TABLET | ORAL | Status: DC

## 2013-01-04 MED ORDER — ASPIRIN 325 MG PO TBEC: 325.0000 mg | DELAYED_RELEASE_TABLET | Freq: Two times a day (BID) | ORAL | Status: DC

## 2013-01-04 MED ORDER — POLYETHYLENE GLYCOL 3350 17 G PO PACK: 17.0000 g | PACK | Freq: Two times a day (BID) | ORAL | Status: DC

## 2013-01-04 MED ORDER — FERROUS SULFATE 325 (65 FE) MG PO TABS: 325.0000 mg | ORAL_TABLET | Freq: Three times a day (TID) | ORAL | Status: DC

## 2013-01-04 MED ORDER — DSS 100 MG PO CAPS: 100.0000 mg | ORAL_CAPSULE | Freq: Two times a day (BID) | ORAL | Status: DC

## 2013-01-04 MED ORDER — TIZANIDINE HCL 4 MG PO TABS: 4.0000 mg | ORAL_TABLET | Freq: Three times a day (TID) | ORAL | Status: DC | PRN

## 2013-01-04 NOTE — Progress Notes (Signed)
   Subjective: 2 Days Post-Op Procedure(s) (LRB): RIGHT TOTAL KNEE ARTHROPLASTY (Right)   Patient reports pain as mild, pain well controlled. No events throughout the night. Ready to be discharged home.  Objective:   VITALS:   Filed Vitals:   01/04/13  BP: 128/85  Pulse: 68  Temp: 98.4 F (36.9 C)   Resp: 16    Neurovascular intact Dorsiflexion/Plantar flexion intact Incision: dressing C/D/I No cellulitis present Compartment soft  LABS  Recent Labs  01/03/13 0435 01/04/13 0415  HGB 12.1 11.1*  HCT 35.6* 34.0*  WBC 11.8* 11.8*  PLT 171 171     Recent Labs  01/03/13 0435 01/04/13 0415  NA 135 132*  K 3.7 3.7  BUN 12 12  CREATININE 0.69 0.65  GLUCOSE 114* 118*     Assessment/Plan: 2 Days Post-Op Procedure(s) (LRB): RIGHT TOTAL KNEE ARTHROPLASTY (Right) Up with therapy Discharge home with home health Follow up in 2 weeks at [facility]. Follow up with OLIN,Derrian Poli D in 2 weeks.  Contact information:  [facility] [address], [address] [place] [address] [phone]    Expected ABLA  Treated with iron and will observe  Overweight (BMI 25-29.9) Estimated body mass index is 26.15 kg/(m^2) as calculated from the following:   Height as of this encounter: 5\' 2"  (1.575 m).   Weight as of this encounter: 64.864 kg (143 lb). Patient also counseled that weight may inhibit the healing process Patient counseled that losing weight will help with future health issues      Anastasio Auerbach. Alonie Gazzola   PAC  01/04/2013, 8:17 AM

## 2013-01-04 NOTE — Progress Notes (Signed)
Pt stable, scripts, d/c instructions given with no questions/concerns voiced by pt/family.  Pt transported via wheelchair to private vehicle with NT and family.

## 2013-01-04 NOTE — Progress Notes (Signed)
Physical Therapy Treatment Patient Details Name: [name] MRN: [id number] DOB: 08-13-1940 Today's Date: 01/04/2013 Time: 0930-1011 PT Time Calculation (min): 41 min  PT Assessment / Plan / Recommendation Comments on Treatment Session       Follow Up Recommendations  Home health PT     Does the patient have the potential to tolerate intense rehabilitation     Barriers to Discharge        Equipment Recommendations  None recommended by PT    Recommendations for Other Services OT consult  Frequency 7X/week   Plan Discharge plan remains appropriate    Precautions / Restrictions Precautions Precautions: Fall Restrictions Weight Bearing Restrictions: No Other Position/Activity Restrictions: WBAT   Pertinent Vitals/Pain 4/10; premed, ice packs provided    Mobility  Bed Mobility Bed Mobility: Supine to Sit Supine to Sit: 5: Supervision Details for Bed Mobility Assistance: cues for sequence Transfers Transfers: Sit to Stand;Stand to Sit Sit to Stand: 4: Min guard Stand to Sit: 5: Supervision Details for Transfer Assistance: good carry over from earlier PT session Ambulation/Gait Ambulation/Gait Assistance: 4: Min guard;5: Supervision Ambulation Distance (Feet): 120 Feet Assistive device: Rolling walker Ambulation/Gait Assistance Details: cues for posture and position from RW Gait Pattern: Step-to pattern Stairs: Yes Stairs Assistance: 4: Min assist Stairs Assistance Details (indicate cue type and reason): cues for sequence and foot/cane placement` Stair Management Technique: One rail Right Number of Stairs: 2 (twice)    Exercises Total Joint Exercises Ankle Circles/Pumps: AROM;Both;Supine;20 reps Quad Sets: AROM;Both;Supine;20 reps Heel Slides: AAROM;Supine;Right;20 reps Straight Leg Raises: AROM;AAROM;Right;Supine;20 reps   PT Diagnosis:    PT Problem List:   PT Treatment Interventions:     PT Goals Acute Rehab PT Goals PT Goal Formulation: With  patient Time For Goal Achievement: 01/10/13 Potential to Achieve Goals: Good Pt will go Supine/Side to Sit: with supervision PT Goal: Supine/Side to Sit - Progress: Met Pt will go Sit to Supine/Side: with supervision PT Goal: Sit to Supine/Side - Progress: Progressing toward goal Pt will go Sit to Stand: with supervision PT Goal: Sit to Stand - Progress: Met Pt will go Stand to Sit: with supervision PT Goal: Stand to Sit - Progress: Met Pt will Ambulate: 51 - 150 feet;with supervision;with rolling walker PT Goal: Ambulate - Progress: Met Pt will Go Up / Down Stairs: 3-5 stairs;with min assist;with least restrictive assistive device PT Goal: Up/Down Stairs - Progress: Met  Visit Information  Last PT Received On: 01/04/13 Assistance Needed: +1    Subjective Data  Subjective: I'm going home today Patient Stated Goal: Resume previous lifestyle with decreased pain.   Cognition  Cognition Arousal/Alertness: Awake/alert Behavior During Therapy: WFL for tasks assessed/performed Overall Cognitive Status: Within Functional Limits for tasks assessed    Balance     End of Session PT - End of Session Equipment Utilized During Treatment: Gait belt Activity Tolerance: Patient tolerated treatment well Patient left: in chair;with call bell/phone within reach;with family/visitor present Nurse Communication: Mobility status   GP     [name] 01/04/2013, 1:06 PM

## 2013-01-04 NOTE — Plan of Care (Signed)
Problem: Consults Goal: Diagnosis- Total Joint Replacement Outcome: Completed/Met Date Met:  01/04/13 Primary Total Knee RIGHT     

## 2013-01-05 NOTE — Care Management Note (Signed)
    Page 1 of 1   01/05/2013     11:31:01 AM   CARE MANAGEMENT NOTE 01/05/2013  Patient:  [name], [name]   Account Number:  [id number]  Date Initiated:  01/03/2013  Documentation initiated by:  Colleen Can  Subjective/Objective Assessment:   dx rt knee osteoarthritis; total knee replacemnt    Pre-arranged with Genevieve Norlander prior to hosp admission to provide [facility] services. Services to start day after discharge.     Action/Plan:   Home with [facility] services; already has DME   Anticipated DC Date:  01/04/2013   Anticipated DC Plan:  HOME W HOME HEALTH SERVICES      DC Planning Services  CM consult      [facility] Choice  HOME HEALTH   Choice offered to / List presented to:          [facility] arranged  HH-2 PT      [facility] agency  [facility]   Status of service:  Completed, signed off Medicare Important Message given?   (If response is "NO", the following Medicare IM given date fields will be blank) Date Medicare IM given:   Date Additional Medicare IM given:    Discharge Disposition:    Per UR Regulation:    If discussed at Long Length of Stay Meetings, dates discussed:    Comments:

## 2013-01-08 NOTE — Discharge Summary (Signed)
Physician Discharge Summary  Patient ID: [name] MRN: [id number] DOB/AGE: Nov 04, 1940 72 y.o.  Admit date: 01/02/2013 Discharge date: 01/04/2013   Procedures:  Procedure(s) (LRB): RIGHT TOTAL KNEE ARTHROPLASTY (Right)  Attending Physician:  Dr. Durene Romans   Admission Diagnoses:   Right knee OA / pain  Discharge Diagnoses:  Principal Problem:   S/P right TKA Active Problems:   Overweight (BMI 25.0-29.9)  Past Medical History  Diagnosis Date  . COPD (chronic obstructive pulmonary disease)   . Depression   . Anxiety   . Gout     last time 6-42yrs ago   . Migraine   . OCD (obsessive compulsive disorder)   . Tinea corporis   . Acute bronchitis   . Allergic rhinitis   . Gout   . Macrocytosis   . Skin rash   . Hypertension     takes Amlodipine nightly  . Hyperlipidemia     taking Flax Seed Oil and Fish Oil  . Emphysema   . Hx of migraines 90's    after menopause HA stopped  . Arthritis     all over  . Joint pain   . Joint swelling   . Osteopenia     takes Calcium and Vit D bid  . Back pain   . Dry skin   . Liver cyst   . IBS (irritable bowel syndrome)   . Diarrhea   . Blood transfusion     at age 68  . Bilateral cataracts   . Hot flashes     takes Prozac daily  . Lung nodule     right  . Bruises easily   . Gallstones   . Arthritis   . Chicken pox   . Headache(784.0)     frequent  . Seasonal allergies   . Hypertension   . History of blood transfusion   . PONV (postoperative nausea and vomiting)     hard to wake  . Pneumonia     x 2 ;last time back in 1999  . Lung cancer   . Cancer   . Anemia     HPI: [name], 72 y.o. female, has a history of pain and functional disability in the right knee due to arthritis and has failed non-surgical conservative treatments for greater than 12 weeks to includeNSAID's and/or analgesics, corticosteriod injections, viscosupplementation injections and activity modification. Onset of symptoms was  gradual, starting >10 years ago with gradually worsening course since that time. The patient noted no past surgery on the right knee(s). Patient currently rates pain in the right knee(s) at 7 out of 10 with activity. Patient has night pain, worsening of pain with activity and weight bearing, pain that interferes with activities of daily living, pain with passive range of motion, crepitus and joint swelling. Patient has evidence of periarticular osteophytes and joint space narrowing by imaging studies. There is no active infection. Risks, benefits and expectations were discussed with the patient. Patient understand the risks, benefits and expectations and wishes to proceed with surgery.   PCP: [name]., DO   Discharged Condition: good  Hospital Course:  Patient underwent the above stated procedure on 01/02/2013. Patient tolerated the procedure well and brought to the recovery room in good condition and subsequently to the floor.  POD #1 BP: 150/78 ; Pulse: 63 ; Temp: 98 F (36.7 C) ; Resp: 14  Pt's foley was removed, as well as the hemovac drain removed. IV was changed to a saline lock.  Patient reports pain as mild, pain well controlled. No events throughout the night. Neurovascular intact, dorsiflexion/plantar flexion intact, incision: dressing C/D/I, no cellulitis present and compartment soft.   LABS  Basename  01/03/13    0435   HGB  12.1  HCT  35.6   POD #2  BP: 128/85 ; Pulse: 68 ; Temp: 98.4 F (36.9 C) ; Resp: 16  Patient reports pain as mild, pain well controlled. No events throughout the night. Ready to be discharged home. Neurovascular intact, dorsiflexion/plantar flexion intact, incision: dressing C/D/I, no cellulitis present and compartment soft.   LABS  Basename  01/04/13    0415  HGB  11.1  HCT  34.0    Discharge Exam: General appearance: alert, cooperative and no distress Extremities: Homans sign is negative, no sign of DVT, no edema, redness or tenderness in the  calves or thighs and no ulcers, gangrene or trophic changes  Disposition:   Home-Health Care Svc with follow up in 2 weeks   Follow-up Information   Follow up with [name], MD. Schedule an appointment as soon as possible for a visit in 2 weeks.   Contact information:   [address] [place] [address] [phone]       Discharge Orders   Future Appointments Provider Department Dept Phone   05/15/2013 9:00 AM Delcie Roch [facility] MED[facility] ONCOL[facility]Y [phone]   05/15/2013 10:00 AM [facility] [phone]   Patient to arrive 15 minutes prior to appointment time. No solid food 4 hours prior to exam. Liquids and Medicines are okay.   05/17/2013 10:00 AM Si Gaul, MD [facility] MEDICAL ONCOLOGY [phone]   Future Orders Complete By Expires     Call MD / Call 911  As directed     Comments:      If you experience chest pain or shortness of breath, CALL 911 and be transported to the hospital emergency room.  If you develope a fever above 101 F, pus (white drainage) or increased drainage or redness at the wound, or calf pain, call your surgeon's office.    Change dressing  As directed     Comments:      Maintain surgical dressing for 10-14 days, then change the dressing daily with sterile 4 x 4 inch gauze dressing and tape. Keep the area dry and clean.    Constipation Prevention  As directed     Comments:      Drink plenty of fluids.  Prune juice may be helpful.  You may use a stool softener, such as Colace (over the counter) 100 mg twice a day.  Use MiraLax (over the counter) for constipation as needed.    Diet - low sodium heart healthy  As directed     Discharge instructions  As directed     Comments:      Maintain surgical dressing for 10-14 days, then replace with gauze and tape. Keep the area dry and clean until follow up. Follow up in 2 weeks at [facility]. Call with  any questions or concerns.    Driving restrictions  As directed     Comments:      No driving for 4 weeks    Increase activity slowly as tolerated  As directed     TED hose  As directed     Comments:      Use stockings (TED hose) for 2 weeks on both leg(s).  You may remove them at night for sleeping.    Weight bearing as tolerated  As directed          Medication List    STOP taking these medications       acetaminophen 650 MG CR tablet  Commonly known as:  TYLENOL     ciprofloxacin 500 MG tablet  Commonly known as:  CIPRO      TAKE these medications       acidophilus Caps  Take 1 capsule by mouth daily.     albuterol 108 (90 BASE) MCG/ACT inhaler  Commonly known as:  PROVENTIL HFA;VENTOLIN HFA  Inhale 2 puffs into the lungs every 6 (six) hours as needed. For wheezing     amLODipine 5 MG tablet  Commonly known as:  NORVASC  Take 5 mg by mouth at bedtime.     aspirin 325 MG EC tablet  Take 1 tablet (325 mg total) by mouth 2 (two) times daily.     budesonide-formoterol 160-4.5 MCG/ACT inhaler  Commonly known as:  SYMBICORT  Inhale 2 puffs into the lungs 2 (two) times daily.     cetirizine 10 MG tablet  Commonly known as:  ZYRTEC  Take 10 mg by mouth daily.     diphenhydrAMINE 25 MG tablet  Commonly known as:  BENADRYL  Take 25 mg by mouth every 6 (six) hours as needed for allergies.     DSS 100 MG Caps  Take 100 mg by mouth 2 (two) times daily.     ferrous sulfate 325 (65 FE) MG tablet  Take 1 tablet (325 mg total) by mouth 3 (three) times daily after meals.     FLUoxetine 20 MG tablet  Commonly known as:  PROZAC  Take 20 mg by mouth 2 (two) times daily.     fluticasone 50 MCG/ACT nasal spray  Commonly known as:  FLONASE  Place 2 sprays into the nose daily as needed. For stuffy nose     GLUCOSAMINE HCL PO  Take 1,000 mg by mouth 2 (two) times daily.     HYDROcodone-acetaminophen 7.5-325 MG per tablet  Commonly known as:  NORCO  Take 1-2 tablets by  mouth every 4 (four) hours.     Magnesium 250 MG Tabs  Take 250 mg by mouth daily.     polyethylene glycol packet  Commonly known as:  MIRALAX / GLYCOLAX  Take 17 g by mouth 2 (two) times daily.     tiZANidine 4 MG tablet  Commonly known as:  ZANAFLEX  Take 1 tablet (4 mg total) by mouth every 8 (eight) hours as needed (muscle spasms).         Signed: Anastasio Auerbach. Tiandra Swoveland   PAC  01/08/2013, 9:20 AM

## 2013-03-19 NOTE — Progress Notes (Signed)
Need orders in EPIC.  Surgery scheduled for 04/03/13.  Thank YOu.

## 2013-03-22 ENCOUNTER — Encounter (HOSPITAL_COMMUNITY): Payer: Self-pay | Admitting: Pharmacy Technician

## 2013-03-22 NOTE — Patient Instructions (Signed)
[name]  03/22/2013   Your procedure is scheduled on: 04/03/13                Surgery 1210pm-120pm   Report to [facility] at    [phone]  AM.  Call this number if you have problems the morning of surgery: [phone]   Remember:   Do not eat food or drink liquids after midnight.   Take these medicines the morning of surgery with A SIP OF WATER:    Do not wear jewelry, make-up or nail polish.  Do not wear lotions, powders, or perfumes.   Do not shave 48 hours prior to surgery.   Do not bring valuables to the hospital.  Contacts, dentures or bridgework may not be worn into surgery.  Leave suitcase in the car. After surgery it may be brought to your room.  For patients admitted to the hospital, checkout time is 11:00 AM the day of  discharge.   SEE CHG INSTRUCTION SHEET    Please read over the following fact sheets that you were given: MRSA Information, coughing and deep breathing exercises, leg exercises, Blood Transfusion Fact Sheet, Incentive SPirometry Fact Sheet                Failure to comply with these instructions may result in cancellation of your surgery.                Patient Signature ____________________________              Nurse Signature _____________________________

## 2013-03-26 ENCOUNTER — Encounter (HOSPITAL_COMMUNITY): Payer: Self-pay

## 2013-03-26 ENCOUNTER — Encounter (HOSPITAL_COMMUNITY)
Admission: RE | Admit: 2013-03-26 | Discharge: 2013-03-26 | Disposition: A | Discharge status: Home / Self Care | Payer: Medicare Other | Source: Ambulatory Visit | Attending: Orthopedic Surgery | Admitting: Orthopedic Surgery

## 2013-03-26 DIAGNOSIS — Z01812 Encounter for preprocedural laboratory examination: Secondary | ICD-10-CM | POA: Insufficient documentation

## 2013-03-26 LAB — CBC
HCT: 41.8 % (ref 36.0–46.0)
Hemoglobin: 14.3 g/dL (ref 12.0–15.0)
MCHC: 34.2 g/dL (ref 30.0–36.0)
MCV: 92.3 fL (ref 78.0–100.0)
RDW: 12.1 % (ref 11.5–15.5)

## 2013-03-26 LAB — BASIC METABOLIC PANEL
BUN: 10 mg/dL (ref 6–23)
CO2: 28 mEq/L (ref 19–32)
Chloride: 102 mEq/L (ref 96–112)
Creatinine, Ser: 0.85 mg/dL (ref 0.50–1.10)
GFR calc Af Amer: 79 mL/min — ABNORMAL LOW (ref >90)
Potassium: 3.2 mEq/L — ABNORMAL LOW (ref 3.5–5.1)

## 2013-03-26 LAB — URINALYSIS, ROUTINE W REFLEX MICROSCOPIC
Bilirubin Urine: NEGATIVE
Glucose, UA: NEGATIVE mg/dL
Nitrite: NEGATIVE
Specific Gravity, Urine: 1.023 (ref 1.005–1.030)
pH: 5.5 (ref 5.0–8.0)

## 2013-03-26 LAB — SURGICAL PCR SCREEN: Staphylococcus aureus: NEGATIVE

## 2013-03-26 LAB — PROTIME-INR: INR: 0.97 (ref 0.00–1.49)

## 2013-03-26 LAB — URINE MICROSCOPIC-ADD ON

## 2013-03-26 NOTE — Progress Notes (Signed)
Patient has allergy to penicillins- cause rash.  Ancef ordered preop.

## 2013-03-26 NOTE — Progress Notes (Addendum)
Last office visit with Dr Marchelle Gearing in [facility]  07/17/12  Office visit note 10/15/11 [facility] on chart  Stress Test 10/12/11 on chart  EKG 12/25/12 [facility]  CHEst CT 11/13/12 [facility]

## 2013-03-27 ENCOUNTER — Encounter: Payer: Self-pay | Admitting: Family Medicine

## 2013-03-27 ENCOUNTER — Ambulatory Visit (INDEPENDENT_AMBULATORY_CARE_PROVIDER_SITE_OTHER): Payer: Medicare Other | Admitting: Family Medicine

## 2013-03-27 VITALS — BP 130/78 | Temp 98.6°F | Wt 144.0 lb

## 2013-03-27 DIAGNOSIS — F3289 Other specified depressive episodes: Secondary | ICD-10-CM

## 2013-03-27 DIAGNOSIS — I10 Essential (primary) hypertension: Secondary | ICD-10-CM

## 2013-03-27 DIAGNOSIS — F329 Major depressive disorder, single episode, unspecified: Secondary | ICD-10-CM

## 2013-03-27 DIAGNOSIS — F32A Depression, unspecified: Secondary | ICD-10-CM

## 2013-03-27 MED ORDER — FLUOXETINE HCL 20 MG PO TABS: 20.0000 mg | ORAL_TABLET | Freq: Two times a day (BID) | ORAL | Status: DC

## 2013-03-27 MED ORDER — AMLODIPINE BESYLATE 5 MG PO TABS: 5.0000 mg | ORAL_TABLET | Freq: Every day | ORAL | Status: DC

## 2013-03-27 NOTE — Progress Notes (Signed)
Chief Complaint  Patient presents with  . Medicaion follow up    HPI:  Follow up:  1) Depression/Anxiety/OCD: -on prozac -stable  2)HTN -stable -denies: CP, SOB, DOE, swelling  ROS: See pertinent positives and negatives per HPI.  Past Medical History  Diagnosis Date  . COPD (chronic obstructive pulmonary disease)   . Depression   . Anxiety   . Gout     last time 6-79yrs ago   . Migraine   . OCD (obsessive compulsive disorder)   . Tinea corporis   . Acute bronchitis   . Allergic rhinitis   . Gout   . Macrocytosis   . Skin rash   . Hypertension     takes Amlodipine nightly  . Hyperlipidemia     taking Flax Seed Oil and Fish Oil  . Emphysema   . Hx of migraines 90's    after menopause HA stopped  . Arthritis     all over  . Osteopenia     takes Calcium and Vit D bid  . Back pain   . Liver cyst   . IBS (irritable bowel syndrome)   . Bilateral cataracts   . Hot flashes     takes Prozac daily  . Lung nodule     right  . Gallstones   . Arthritis   . Chicken pox   . Seasonal allergies   . Hypertension   . History of blood transfusion   . Pneumonia     x 2 ;last time back in 1999  . Lung cancer   . Anemia   . S/P right THA, AA - Dr. 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 Smoking 09/12/2011    Family History  Problem Relation Age of Onset  . Alcohol abuse    . Depression    . Hearing loss    . Heart disease    . Hypertension    . Anesthesia problems Neg Hx   . Hypotension Neg Hx   . Malignant hyperthermia Neg Hx   . Pseudochol deficiency Neg Hx   . Hypertension Brother     History   Social History  . Marital Status: Married    Spouse Name: N/A    Number of Children: N/A  . Years of Education: N/A   Occupational History  . retired    Social History Main Topics  . Smoking status: Former Smoker -- 0.50 packs/day for 50 years    Types: Cigarettes    Quit date: 09/23/2011  . Smokeless tobacco: Never Used  . Alcohol Use: 0.0  oz/week     Comment: 2 beers every night  . Drug Use: No  . Sexual Activity: No   Other Topics Concern  . None   Social History Narrative  . None    Current outpatient prescriptions:amLODipine (NORVASC) 5 MG tablet, Take 1 tablet (5 mg total) by mouth at bedtime., Disp: 90 tablet, Rfl: 3;  budesonide-formoterol (SYMBICORT) 160-4.5 MCG/ACT inhaler, Inhale 2 puffs into the lungs 2 (two) times daily., Disp: , Rfl: ;  cetirizine (ZYRTEC) 10 MG tablet, Take 10 mg by mouth daily as needed for allergies. , Disp: , Rfl:  FLUoxetine (PROZAC) 20 MG tablet, Take 1 tablet (20 mg total) by mouth 2 (two) times daily., Disp: 90 tablet, Rfl: 3;  triamcinolone (NASACORT) 55 MCG/ACT nasal inhaler, Place 2 sprays into the nose daily as needed (for allergies)., Disp: , Rfl: ;  albuterol (PROVENTIL HFA;VENTOLIN HFA) 108 (90 BASE) MCG/ACT  inhaler, Inhale 2 puffs into the lungs every 6 (six) hours as needed for wheezing or shortness of breath. , Disp: , Rfl:  diphenhydrAMINE (BENADRYL) 25 MG tablet, Take 25 mg by mouth every 6 (six) hours as needed for allergies. , Disp: , Rfl: ;  GLUCOSAMINE HCL PO, Take 1,000 mg by mouth 2 (two) times daily., Disp: , Rfl: ;  ibuprofen (ADVIL,MOTRIN) 200 MG tablet, Take 400 mg by mouth every 8 (eight) hours as needed for pain., Disp: , Rfl: ;  [DISCONTINUED] Calcium Carbonate-Vitamin D (CALCIUM + D PO), Take by mouth daily. 500+400 , Disp: , Rfl:   EXAM:  Filed Vitals:   03/27/13 1300  BP: 130/78  Temp: 98.6 F (37 C)    Body mass index is 26.33 kg/(m^2).  GENERAL: vitals reviewed and listed above, alert, oriented, appears well hydrated and in no acute distress  HEENT: atraumatic, conjunttiva clear, no obvious abnormalities on inspection of external nose and ears  NECK: no obvious masses on inspection  LUNGS: clear to auscultation bilaterally, no wheezes, rales or rhonchi, good air movement  CV: HRRR, no peripheral edema  MS: moves all extremities without noticeable  abnormality  PSYCH: pleasant and cooperative, no obvious depression or anxiety  ASSESSMENT AND PLAN:  Discussed the following assessment and plan:  Depression - Plan: FLUoxetine (PROZAC) 20 MG tablet  Hypertension - Plan: amLODipine (NORVASC) 5 MG tablet  -stable, doing well -refilled medications -she is going to get physical in late fall -Patient advised to return or notify a doctor immediately if symptoms worsen or persist or new concerns arise.  There are no Patient Instructions on file for this visit.   Kriste Basque R.

## 2013-03-29 NOTE — H&P (Signed)
TOTAL KNEE ADMISSION H&P  Patient is being admitted for left total knee arthroplasty.  Subjective:  Chief Complaint:   Left knee OA / pain.  HPI: [name], 72 y.o. female, has a history of pain and functional disability in the left knee due to arthritis and has failed non-surgical conservative treatments for greater than 12 weeks to include NSAID's and/or analgesics, corticosteriod injections, viscosupplementation injections and activity modification.  Onset of symptoms was gradual, starting >10 years ago with gradually worsening course since that time. The patient noted no past surgery on the left knee(s).  Patient currently rates pain in the left knee(s) at 6 out of 10 with activity. Patient has night pain, worsening of pain with activity and weight bearing, pain that interferes with activities of daily living, pain with passive range of motion, crepitus and joint swelling.  Patient has evidence of periarticular osteophytes and joint space narrowing by imaging studies.  There is no active signs of infection.  Risks, benefits and expectations were discussed with the patient. Patient understand the risks, benefits and expectations and wishes to proceed with surgery.   D/C Plans:   Home with HHPT  Post-op Meds:     Rx given for ASA, Zanaflex, Iron, Colace and MiraLax  Tranexamic Acid:   To be given  Decadron:    To be given  FYI:    ASA post-op    Patient Active Problem List   Diagnosis Date Noted  . Overweight (BMI 25.0-29.9) 01/03/2013  . Depression 06/22/2012  . Hypertension 06/22/2012  . Moderate COPD (chronic obstructive pulmonary disease), Sees Dr. Marchelle Gearing, [facility] 09/12/2011  . Bronchogenic lung cancer, sees Dr. Arbutus Ped, Oncology and Dr. Tyrone Sage, Cardiothoracic surgery, and Dr. Marchelle Gearing, [facility] 09/12/2011   Past Medical History  Diagnosis Date  . COPD (chronic obstructive pulmonary disease)   . Depression   . Anxiety   . Gout     last time 6-13yrs ago   . Migraine   .  OCD (obsessive compulsive disorder)   . Tinea corporis   . Acute bronchitis   . Allergic rhinitis   . Gout   . Macrocytosis   . Skin rash   . Hypertension     takes Amlodipine nightly  . Hyperlipidemia     taking Flax Seed Oil and Fish Oil  . Emphysema   . Hx of migraines 90's    after menopause HA stopped  . Arthritis     all over  . Osteopenia     takes Calcium and Vit D bid  . Back pain   . Liver cyst   . IBS (irritable bowel syndrome)   . Bilateral cataracts   . Hot flashes     takes Prozac daily  . Lung nodule     right  . Gallstones   . Arthritis   . Chicken pox   . Seasonal allergies   . Hypertension   . History of blood transfusion   . Pneumonia     x 2 ;last time back in 1999  . Lung cancer   . Anemia   . S/P right THA, AA - Dr. 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 Smoking 09/12/2011    Past Surgical History  Procedure Laterality Date  . Inguinal hernia repair  2000  . Neuroplasty decompression median nerve at carpal tunnel    . Rotator cuff repair  2001    right  . Tonsillectomy    . Tonsillectomy  as a  child    and adenoids  . Dilation and curettage of uterus      at age 63  . Cyst removed  >75yrs ago    from left leg  . Carpal tunnel release  80's    right   . Total abdominal hysterectomy  2003  . Total hip arthroplasty  2011    left  . Colonoscopy    . Video bronchoscopy  10/20/2011    Procedure: VIDEO BRONCHOSCOPY;  Surgeon: [name], MD;  Location: [facility] OR;  Service: Thoracic;  Laterality: N/A;  . Wedge resection  10-20-2011    rt lung  - for lung cancer  . Total hip arthroplasty  03/07/2012    Procedure: TOTAL HIP ARTHROPLASTY ANTERIOR APPROACH;  Surgeon: [name], MD;  Location: [facility] ORS;  Service: Orthopedics;  Laterality: Right;  . Total knee arthroplasty Right 01/02/2013    Procedure: RIGHT TOTAL KNEE ARTHROPLASTY;  Surgeon: [name], MD;  Location: [facility] ORS;  Service: Orthopedics;  Laterality: Right;     No prescriptions prior to admission   Allergies  Allergen Reactions  . Clindamycin/Lincomycin Nausea And Vomiting  . Penicillins Rash       . Spiriva [Tiotropium Bromide Monohydrate]     General intolerance  . Sulfa Antibiotics Rash    Severe Hives with skin reaction    History  Substance Use Topics  . Smoking status: Former Smoker -- 0.50 packs/day for 50 years    Types: Cigarettes    Quit date: 09/23/2011  . Smokeless tobacco: Never Used  . Alcohol Use: 0.0 oz/week     Comment: 2 beers every night    Family History  Problem Relation Age of Onset  . Alcohol abuse    . Depression    . Hearing loss    . Heart disease    . Hypertension    . Anesthesia problems Neg Hx   . Hypotension Neg Hx   . Malignant hyperthermia Neg Hx   . Pseudochol deficiency Neg Hx   . Hypertension Brother      Review of Systems  Constitutional: Negative.   HENT: Negative.   Eyes: Negative.   Respiratory: Positive for shortness of breath (on exertion).   Cardiovascular: Negative.   Gastrointestinal: Positive for heartburn.  Genitourinary: Negative.   Musculoskeletal: Positive for back pain and joint pain.  Skin: Negative.   Neurological: Negative.   Endo/Heme/Allergies: Positive for environmental allergies.  Psychiatric/Behavioral: The patient is nervous/anxious.     Objective:  Physical Exam  Constitutional: She is oriented to person, place, and time. She appears well-developed and well-nourished.  HENT:  Head: Normocephalic and atraumatic.  Mouth/Throat: Oropharynx is clear and moist.  Eyes: Pupils are equal, round, and reactive to light.  Neck: Neck supple. No JVD present. No tracheal deviation present. No thyromegaly present.  Cardiovascular: Normal rate, regular rhythm, normal heart sounds and intact distal pulses.   Respiratory: Effort normal and breath sounds normal. No stridor. No respiratory distress. She has no wheezes.  GI: Soft. There is no tenderness. There is no  guarding.  Musculoskeletal:       Left knee: She exhibits decreased range of motion, swelling, abnormal alignment and bony tenderness. She exhibits no effusion, no ecchymosis, no deformity, no laceration and no erythema. Tenderness found.  Lymphadenopathy:    She has no cervical adenopathy.  Neurological: She is alert and oriented to person, place, and time.  Skin: Skin is warm and dry.  Psychiatric: She has a  normal mood and affect.     Labs:  Estimated body mass index is 25.60 kg/(m^2) as calculated from the following:   Height as of 01/02/13: 5\' 2"  (1.575 m).   Weight as of 12/28/12: 63.504 kg (140 lb).   Imaging Review Plain radiographs demonstrate severe degenerative joint disease of the left knee(s). The overall alignment is neutral. The bone quality appears to be good for age and reported activity level.  Assessment/Plan:  End stage arthritis, left knee   The patient history, physical examination, clinical judgment of the provider and imaging studies are consistent with end stage degenerative joint disease of the left knee(s) and total knee arthroplasty is deemed medically necessary. The treatment options including medical management, injection therapy arthroscopy and arthroplasty were discussed at length. The risks and benefits of total knee arthroplasty were presented and reviewed. The risks due to aseptic loosening, infection, stiffness, patella tracking problems, thromboembolic complications and other imponderables were discussed. The patient acknowledged the explanation, agreed to proceed with the plan and consent was signed. Patient is being admitted for inpatient treatment for surgery, pain control, PT, OT, prophylactic antibiotics, VTE prophylaxis, progressive ambulation and ADL's and discharge planning. The patient is planning to be discharged home with home health services.    Anastasio Auerbach Nely Dedmon   PAC  03/29/2013, 12:38 PM

## 2013-04-03 ENCOUNTER — Encounter (HOSPITAL_COMMUNITY): Payer: Self-pay | Admitting: Anesthesiology

## 2013-04-03 ENCOUNTER — Inpatient Hospital Stay (HOSPITAL_COMMUNITY)
Admission: RE | Admit: 2013-04-03 | Discharge: 2013-04-05 | DRG: 470 | Disposition: A | Discharge status: Home Health | Payer: Medicare Other | Source: Ambulatory Visit | Attending: Orthopedic Surgery | Admitting: Orthopedic Surgery

## 2013-04-03 ENCOUNTER — Encounter (HOSPITAL_COMMUNITY)
Admission: RE | Disposition: A | Discharge status: Home Health | Payer: Self-pay | Source: Ambulatory Visit | Attending: Orthopedic Surgery

## 2013-04-03 ENCOUNTER — Inpatient Hospital Stay (HOSPITAL_COMMUNITY): Payer: Medicare Other | Admitting: Anesthesiology

## 2013-04-03 DIAGNOSIS — Z96659 Presence of unspecified artificial knee joint: Secondary | ICD-10-CM | POA: Insufficient documentation

## 2013-04-03 DIAGNOSIS — D62 Acute posthemorrhagic anemia: Secondary | ICD-10-CM | POA: Diagnosis not present

## 2013-04-03 DIAGNOSIS — Z96652 Presence of left artificial knee joint: Secondary | ICD-10-CM

## 2013-04-03 DIAGNOSIS — F3289 Other specified depressive episodes: Secondary | ICD-10-CM | POA: Diagnosis present

## 2013-04-03 DIAGNOSIS — I10 Essential (primary) hypertension: Secondary | ICD-10-CM | POA: Diagnosis present

## 2013-04-03 DIAGNOSIS — F429 Obsessive-compulsive disorder, unspecified: Secondary | ICD-10-CM | POA: Diagnosis present

## 2013-04-03 DIAGNOSIS — M1710 Unilateral primary osteoarthritis, unspecified knee: Principal | ICD-10-CM | POA: Diagnosis present

## 2013-04-03 DIAGNOSIS — H269 Unspecified cataract: Secondary | ICD-10-CM | POA: Diagnosis present

## 2013-04-03 DIAGNOSIS — Z96649 Presence of unspecified artificial hip joint: Secondary | ICD-10-CM

## 2013-04-03 DIAGNOSIS — E871 Hypo-osmolality and hyponatremia: Secondary | ICD-10-CM | POA: Diagnosis present

## 2013-04-03 DIAGNOSIS — F329 Major depressive disorder, single episode, unspecified: Secondary | ICD-10-CM | POA: Diagnosis present

## 2013-04-03 DIAGNOSIS — K589 Irritable bowel syndrome without diarrhea: Secondary | ICD-10-CM | POA: Diagnosis present

## 2013-04-03 DIAGNOSIS — Z87891 Personal history of nicotine dependence: Secondary | ICD-10-CM

## 2013-04-03 DIAGNOSIS — F411 Generalized anxiety disorder: Secondary | ICD-10-CM | POA: Diagnosis present

## 2013-04-03 DIAGNOSIS — M6580 Other synovitis and tenosynovitis, unspecified site: Secondary | ICD-10-CM | POA: Diagnosis present

## 2013-04-03 DIAGNOSIS — Z01812 Encounter for preprocedural laboratory examination: Secondary | ICD-10-CM

## 2013-04-03 DIAGNOSIS — E663 Overweight: Secondary | ICD-10-CM | POA: Diagnosis present

## 2013-04-03 DIAGNOSIS — E785 Hyperlipidemia, unspecified: Secondary | ICD-10-CM | POA: Diagnosis present

## 2013-04-03 DIAGNOSIS — J309 Allergic rhinitis, unspecified: Secondary | ICD-10-CM | POA: Diagnosis present

## 2013-04-03 DIAGNOSIS — C3490 Malignant neoplasm of unspecified part of unspecified bronchus or lung: Secondary | ICD-10-CM | POA: Diagnosis present

## 2013-04-03 DIAGNOSIS — Z6826 Body mass index (BMI) 26.0-26.9, adult: Secondary | ICD-10-CM

## 2013-04-03 HISTORY — PX: TOTAL KNEE ARTHROPLASTY: SHX125

## 2013-04-03 LAB — TYPE AND SCREEN: Antibody Screen: NEGATIVE

## 2013-04-03 SURGERY — ARTHROPLASTY, KNEE, TOTAL
Anesthesia: Spinal | Site: Knee | Laterality: Left | Wound class: Clean

## 2013-04-03 MED ORDER — AMLODIPINE BESYLATE 5 MG PO TABS
5.0000 mg | ORAL_TABLET | Freq: Every day | ORAL | Status: DC
  Administered 2013-04-04: 5 mg via ORAL
  Filled 2013-04-03 (×2): qty 1

## 2013-04-03 MED ORDER — BUPIVACAINE LIPOSOME 1.3 % IJ SUSP
INTRAMUSCULAR | Status: DC | PRN
  Administered 2013-04-03: 20 mL

## 2013-04-03 MED ORDER — DOCUSATE SODIUM 100 MG PO CAPS
100.0000 mg | ORAL_CAPSULE | Freq: Two times a day (BID) | ORAL | Status: DC
  Administered 2013-04-03 – 2013-04-05 (×4): 100 mg via ORAL

## 2013-04-03 MED ORDER — DEXAMETHASONE SODIUM PHOSPHATE 10 MG/ML IJ SOLN
10.0000 mg | Freq: Once | INTRAMUSCULAR | Status: AC
  Administered 2013-04-04: 10 mg via INTRAVENOUS
  Filled 2013-04-03: qty 1

## 2013-04-03 MED ORDER — ASPIRIN EC 325 MG PO TBEC
325.0000 mg | DELAYED_RELEASE_TABLET | Freq: Two times a day (BID) | ORAL | Status: DC
  Administered 2013-04-04 – 2013-04-05 (×3): 325 mg via ORAL
  Filled 2013-04-03 (×6): qty 1

## 2013-04-03 MED ORDER — CHLORHEXIDINE GLUCONATE 4 % EX LIQD: 60.0000 mL | Freq: Once | CUTANEOUS | Status: DC

## 2013-04-03 MED ORDER — FLUOXETINE HCL 20 MG PO TABS
20.0000 mg | ORAL_TABLET | Freq: Two times a day (BID) | ORAL | Status: DC
  Administered 2013-04-03 – 2013-04-05 (×4): 20 mg via ORAL
  Filled 2013-04-03 (×6): qty 1

## 2013-04-03 MED ORDER — MENTHOL 3 MG MT LOZG: 1.0000 | LOZENGE | OROMUCOSAL | Status: DC | PRN

## 2013-04-03 MED ORDER — ONDANSETRON HCL 4 MG PO TABS: 4.0000 mg | ORAL_TABLET | Freq: Four times a day (QID) | ORAL | Status: DC | PRN

## 2013-04-03 MED ORDER — LORATADINE 10 MG PO TABS
10.0000 mg | ORAL_TABLET | Freq: Every day | ORAL | Status: DC
  Administered 2013-04-04 – 2013-04-05 (×2): 10 mg via ORAL
  Filled 2013-04-03 (×2): qty 1

## 2013-04-03 MED ORDER — METHOCARBAMOL 100 MG/ML IJ SOLN
500.0000 mg | Freq: Four times a day (QID) | INTRAVENOUS | Status: DC | PRN
  Filled 2013-04-03: qty 5

## 2013-04-03 MED ORDER — METOCLOPRAMIDE HCL 5 MG/ML IJ SOLN
5.0000 mg | Freq: Three times a day (TID) | INTRAMUSCULAR | Status: DC | PRN
  Administered 2013-04-04: 10 mg via INTRAVENOUS
  Filled 2013-04-03: qty 2

## 2013-04-03 MED ORDER — FENTANYL CITRATE 0.05 MG/ML IJ SOLN
INTRAMUSCULAR | Status: DC | PRN
  Administered 2013-04-03 (×2): 50 ug via INTRAVENOUS
  Administered 2013-04-03 (×2): 25 ug via INTRAVENOUS
  Administered 2013-04-03 (×2): 50 ug via INTRAVENOUS

## 2013-04-03 MED ORDER — FLEET ENEMA 7-19 GM/118ML RE ENEM: 1.0000 | ENEMA | Freq: Once | RECTAL | Status: AC | PRN

## 2013-04-03 MED ORDER — CEFAZOLIN SODIUM-DEXTROSE 2-3 GM-% IV SOLR
2.0000 g | INTRAVENOUS | Status: AC
  Administered 2013-04-03: 2 g via INTRAVENOUS

## 2013-04-03 MED ORDER — DEXAMETHASONE SODIUM PHOSPHATE 10 MG/ML IJ SOLN: 10.0000 mg | Freq: Once | INTRAMUSCULAR | Status: DC

## 2013-04-03 MED ORDER — PHENOL 1.4 % MT LIQD: 1.0000 | OROMUCOSAL | Status: DC | PRN

## 2013-04-03 MED ORDER — PHENYLEPHRINE HCL 10 MG/ML IJ SOLN
INTRAMUSCULAR | Status: DC | PRN
  Administered 2013-04-03 (×8): 80 ug via INTRAVENOUS

## 2013-04-03 MED ORDER — KETOROLAC TROMETHAMINE 15 MG/ML IJ SOLN
INTRAMUSCULAR | Status: DC | PRN
  Administered 2013-04-03: 15 mg via INTRAVENOUS

## 2013-04-03 MED ORDER — FENTANYL CITRATE 0.05 MG/ML IJ SOLN: 25.0000 ug | INTRAMUSCULAR | Status: DC | PRN

## 2013-04-03 MED ORDER — BUPIVACAINE HCL (PF) 0.75 % IJ SOLN
INTRAMUSCULAR | Status: DC | PRN
  Administered 2013-04-03: 1.8 mL via INTRATHECAL

## 2013-04-03 MED ORDER — TRANEXAMIC ACID 100 MG/ML IV SOLN
1000.0000 mg | Freq: Once | INTRAVENOUS | Status: AC
  Administered 2013-04-03: 1000 mg via INTRAVENOUS
  Filled 2013-04-03: qty 10

## 2013-04-03 MED ORDER — PROMETHAZINE HCL 25 MG/ML IJ SOLN: 6.2500 mg | INTRAMUSCULAR | Status: DC | PRN

## 2013-04-03 MED ORDER — FLUTICASONE PROPIONATE 50 MCG/ACT NA SUSP
2.0000 | Freq: Every day | NASAL | Status: DC
  Administered 2013-04-03 – 2013-04-04 (×2): 2 via NASAL
  Filled 2013-04-03: qty 16

## 2013-04-03 MED ORDER — POLYETHYLENE GLYCOL 3350 17 G PO PACK
17.0000 g | PACK | Freq: Two times a day (BID) | ORAL | Status: DC
  Administered 2013-04-04 – 2013-04-05 (×3): 17 g via ORAL

## 2013-04-03 MED ORDER — BUPIVACAINE-EPINEPHRINE PF 0.25-1:200000 % IJ SOLN
INTRAMUSCULAR | Status: DC | PRN
  Administered 2013-04-03: 25 mL

## 2013-04-03 MED ORDER — KETOROLAC TROMETHAMINE 30 MG/ML IJ SOLN
INTRAMUSCULAR | Status: DC | PRN
  Administered 2013-04-03: 30 mg via INTRAMUSCULAR

## 2013-04-03 MED ORDER — FERROUS SULFATE 325 (65 FE) MG PO TABS
325.0000 mg | ORAL_TABLET | Freq: Three times a day (TID) | ORAL | Status: DC
  Administered 2013-04-03 – 2013-04-05 (×4): 325 mg via ORAL
  Filled 2013-04-03 (×8): qty 1

## 2013-04-03 MED ORDER — ONDANSETRON HCL 4 MG/2ML IJ SOLN: 4.0000 mg | Freq: Four times a day (QID) | INTRAMUSCULAR | Status: DC | PRN

## 2013-04-03 MED ORDER — ZOLPIDEM TARTRATE 5 MG PO TABS: 5.0000 mg | ORAL_TABLET | Freq: Every evening | ORAL | Status: DC | PRN

## 2013-04-03 MED ORDER — MIDAZOLAM HCL 5 MG/5ML IJ SOLN
INTRAMUSCULAR | Status: DC | PRN
  Administered 2013-04-03: 1 mg via INTRAVENOUS
  Administered 2013-04-03: 2 mg via INTRAVENOUS
  Administered 2013-04-03: 1 mg via INTRAVENOUS

## 2013-04-03 MED ORDER — KETOROLAC TROMETHAMINE 30 MG/ML IJ SOLN: 15.0000 mg | Freq: Once | INTRAMUSCULAR | Status: DC | PRN

## 2013-04-03 MED ORDER — KETAMINE HCL 10 MG/ML IJ SOLN
INTRAMUSCULAR | Status: DC | PRN
  Administered 2013-04-03 (×3): 10 mg via INTRAVENOUS

## 2013-04-03 MED ORDER — CEFAZOLIN SODIUM-DEXTROSE 2-3 GM-% IV SOLR
2.0000 g | Freq: Four times a day (QID) | INTRAVENOUS | Status: AC
  Administered 2013-04-03 (×2): 2 g via INTRAVENOUS
  Filled 2013-04-03 (×2): qty 50

## 2013-04-03 MED ORDER — METOCLOPRAMIDE HCL 10 MG PO TABS: 5.0000 mg | ORAL_TABLET | Freq: Three times a day (TID) | ORAL | Status: DC | PRN

## 2013-04-03 MED ORDER — BUPIVACAINE LIPOSOME 1.3 % IJ SUSP
20.0000 mL | Freq: Once | INTRAMUSCULAR | Status: DC
  Filled 2013-04-03: qty 20

## 2013-04-03 MED ORDER — PROPOFOL 10 MG/ML IV BOLUS
INTRAVENOUS | Status: DC | PRN
  Administered 2013-04-03 (×4): 10 mg via INTRAVENOUS

## 2013-04-03 MED ORDER — BUDESONIDE-FORMOTEROL FUMARATE 160-4.5 MCG/ACT IN AERO
2.0000 | INHALATION_SPRAY | Freq: Two times a day (BID) | RESPIRATORY_TRACT | Status: DC
  Filled 2013-04-03: qty 6

## 2013-04-03 MED ORDER — DIPHENHYDRAMINE HCL 25 MG PO CAPS: 25.0000 mg | ORAL_CAPSULE | Freq: Four times a day (QID) | ORAL | Status: DC | PRN

## 2013-04-03 MED ORDER — ALBUTEROL SULFATE HFA 108 (90 BASE) MCG/ACT IN AERS
2.0000 | INHALATION_SPRAY | Freq: Four times a day (QID) | RESPIRATORY_TRACT | Status: DC | PRN
  Filled 2013-04-03: qty 6.7

## 2013-04-03 MED ORDER — SODIUM CHLORIDE 0.9 % IV SOLN
INTRAVENOUS | Status: DC
  Administered 2013-04-03 – 2013-04-04 (×2): via INTRAVENOUS
  Filled 2013-04-03 (×5): qty 1000

## 2013-04-03 MED ORDER — ALUM & MAG HYDROXIDE-SIMETH 200-200-20 MG/5ML PO SUSP: 30.0000 mL | ORAL | Status: DC | PRN

## 2013-04-03 MED ORDER — METHOCARBAMOL 500 MG PO TABS: 500.0000 mg | ORAL_TABLET | Freq: Four times a day (QID) | ORAL | Status: DC | PRN

## 2013-04-03 MED ORDER — LACTATED RINGERS IV SOLN
INTRAVENOUS | Status: DC
Start: 2013-04-03 — End: 2013-04-03
  Administered 2013-04-03: 12:00:00 via INTRAVENOUS
  Administered 2013-04-03: 1000 mL via INTRAVENOUS
  Administered 2013-04-03: 13:00:00 via INTRAVENOUS

## 2013-04-03 MED ORDER — HYDROMORPHONE HCL PF 1 MG/ML IJ SOLN
0.5000 mg | INTRAMUSCULAR | Status: DC | PRN
  Administered 2013-04-03 (×2): 1 mg via INTRAVENOUS
  Filled 2013-04-03 (×2): qty 1

## 2013-04-03 MED ORDER — HYDROCODONE-ACETAMINOPHEN 7.5-325 MG PO TABS
1.0000 | ORAL_TABLET | ORAL | Status: DC
  Administered 2013-04-03: 2 via ORAL
  Administered 2013-04-03 – 2013-04-04 (×4): 1 via ORAL
  Administered 2013-04-04 (×3): 2 via ORAL
  Administered 2013-04-05 (×3): 1 via ORAL
  Filled 2013-04-03 (×3): qty 1
  Filled 2013-04-03: qty 2
  Filled 2013-04-03: qty 1
  Filled 2013-04-03: qty 2
  Filled 2013-04-03: qty 1
  Filled 2013-04-03 (×2): qty 2
  Filled 2013-04-03 (×3): qty 1

## 2013-04-03 MED ORDER — SODIUM CHLORIDE 0.9 % IJ SOLN
INTRAMUSCULAR | Status: DC | PRN
  Administered 2013-04-03: 14 mL

## 2013-04-03 MED ORDER — PROPOFOL INFUSION 10 MG/ML OPTIME
INTRAVENOUS | Status: DC | PRN
  Administered 2013-04-03: 50 ug/kg/min via INTRAVENOUS
  Administered 2013-04-03: 25 ug/kg/min via INTRAVENOUS

## 2013-04-03 MED ORDER — BISACODYL 10 MG RE SUPP: 10.0000 mg | Freq: Every day | RECTAL | Status: DC | PRN

## 2013-04-03 SURGICAL SUPPLY — 59 items
BAG ZIPLOCK 12X15 (MISCELLANEOUS) ×2 IMPLANT
BANDAGE ELASTIC 6 VELCRO ST LF (GAUZE/BANDAGES/DRESSINGS) ×2 IMPLANT
BANDAGE ESMARK 6X9 LF (GAUZE/BANDAGES/DRESSINGS) ×1 IMPLANT
BLADE SAW SGTL 13.0X1.19X90.0M (BLADE) ×2 IMPLANT
BNDG ESMARK 6X9 LF (GAUZE/BANDAGES/DRESSINGS) ×2
BOWL SMART MIX CTS (DISPOSABLE) ×2 IMPLANT
CAPT RP KNEE ×2 IMPLANT
CEMENT HV SMART SET (Cement) ×4 IMPLANT
CLOTH BEACON ORANGE TIMEOUT ST (SAFETY) ×2 IMPLANT
CUFF TOURN SGL QUICK 34 (TOURNIQUET CUFF) ×1
CUFF TRNQT CYL 34X4X40X1 (TOURNIQUET CUFF) ×1 IMPLANT
DECANTER SPIKE VIAL GLASS SM (MISCELLANEOUS) ×2 IMPLANT
DERMABOND ADVANCED (GAUZE/BANDAGES/DRESSINGS) ×1
DERMABOND ADVANCED .7 DNX12 (GAUZE/BANDAGES/DRESSINGS) ×1 IMPLANT
DRAPE EXTREMITY T 121X128X90 (DRAPE) ×2 IMPLANT
DRAPE POUCH INSTRU U-SHP 10X18 (DRAPES) ×2 IMPLANT
DRAPE U-SHAPE 47X51 STRL (DRAPES) ×2 IMPLANT
DRSG AQUACEL AG ADV 3.5X10 (GAUZE/BANDAGES/DRESSINGS) ×2 IMPLANT
DRSG TEGADERM 4X4.75 (GAUZE/BANDAGES/DRESSINGS) ×2 IMPLANT
DURAPREP 26ML APPLICATOR (WOUND CARE) ×2 IMPLANT
ELECT REM PT RETURN 9FT ADLT (ELECTROSURGICAL) ×2
ELECTRODE REM PT RTRN 9FT ADLT (ELECTROSURGICAL) ×1 IMPLANT
EVACUATOR 1/8 PVC DRAIN (DRAIN) ×2 IMPLANT
FACESHIELD LNG OPTICON STERILE (SAFETY) ×10 IMPLANT
GAUZE SPONGE 2X2 8PLY STRL LF (GAUZE/BANDAGES/DRESSINGS) ×1 IMPLANT
GLOVE BIOGEL PI IND STRL 7.0 (GLOVE) ×1 IMPLANT
GLOVE BIOGEL PI IND STRL 7.5 (GLOVE) ×1 IMPLANT
GLOVE BIOGEL PI IND STRL 8 (GLOVE) ×1 IMPLANT
GLOVE BIOGEL PI INDICATOR 7.0 (GLOVE) ×1
GLOVE BIOGEL PI INDICATOR 7.5 (GLOVE) ×1
GLOVE BIOGEL PI INDICATOR 8 (GLOVE) ×1
GLOVE ECLIPSE 6.5 STRL STRAW (GLOVE) ×2 IMPLANT
GLOVE ECLIPSE 8.0 STRL XLNG CF (GLOVE) ×12 IMPLANT
GLOVE ORTHO TXT STRL SZ7.5 (GLOVE) ×4 IMPLANT
GOWN BRE IMP PREV XXLGXLNG (GOWN DISPOSABLE) ×4 IMPLANT
GOWN STRL NON-REIN LRG LVL3 (GOWN DISPOSABLE) ×4 IMPLANT
HANDPIECE INTERPULSE COAX TIP (DISPOSABLE) ×1
KIT BASIN OR (CUSTOM PROCEDURE TRAY) ×2 IMPLANT
MANIFOLD NEPTUNE II (INSTRUMENTS) ×2 IMPLANT
NDL SAFETY ECLIPSE 18X1.5 (NEEDLE) ×2 IMPLANT
NEEDLE HYPO 18GX1.5 SHARP (NEEDLE) ×2
NS IRRIG 1000ML POUR BTL (IV SOLUTION) ×4 IMPLANT
PACK TOTAL JOINT (CUSTOM PROCEDURE TRAY) ×2 IMPLANT
POSITIONER SURGICAL ARM (MISCELLANEOUS) ×2 IMPLANT
SET HNDPC FAN SPRY TIP SCT (DISPOSABLE) ×1 IMPLANT
SET PAD KNEE POSITIONER (MISCELLANEOUS) ×2 IMPLANT
SPONGE GAUZE 2X2 STER 10/PKG (GAUZE/BANDAGES/DRESSINGS) ×1
SUCTION FRAZIER 12FR DISP (SUCTIONS) ×2 IMPLANT
SUT MNCRL AB 4-0 PS2 18 (SUTURE) ×2 IMPLANT
SUT VIC AB 1 CT1 36 (SUTURE) ×2 IMPLANT
SUT VIC AB 2-0 CT1 27 (SUTURE) ×3
SUT VIC AB 2-0 CT1 TAPERPNT 27 (SUTURE) ×3 IMPLANT
SUT VLOC 180 0 24IN GS25 (SUTURE) ×2 IMPLANT
SYR 3ML LL SCALE MARK (SYRINGE) ×2 IMPLANT
SYR 50ML LL SCALE MARK (SYRINGE) ×2 IMPLANT
TOWEL OR 17X26 10 PK STRL BLUE (TOWEL DISPOSABLE) ×4 IMPLANT
TRAY FOLEY CATH 14FRSI W/METER (CATHETERS) ×2 IMPLANT
WATER STERILE IRR 1500ML POUR (IV SOLUTION) ×2 IMPLANT
WRAP KNEE MAXI GEL POST OP (GAUZE/BANDAGES/DRESSINGS) IMPLANT

## 2013-04-03 NOTE — Op Note (Signed)
NAME:  [name]                      MEDICAL RECORD NO.:  [id number]                             FACILITY:  [facility]      PHYSICIAN:  Madlyn Frankel. Charlann Boxer, M.D.  DATE OF BIRTH:  1941-02-20      DATE OF PROCEDURE:  04/03/2013                                     OPERATIVE REPORT         PREOPERATIVE DIAGNOSIS:  Left knee osteoarthritis.      POSTOPERATIVE DIAGNOSIS:  Left knee osteoarthritis.      FINDINGS:  The patient was noted to have complete loss of cartilage and   bone-on-bone arthritis with associated osteophytes in the lateral and patellofemoral compartments of   the knee with a significant synovitis and associated effusion.      PROCEDURE:  Left total knee replacement.      COMPONENTS USED:  DePuy rotating platform posterior stabilized knee   system, a size 2 femur, 2 tibia, 10 mm PS insert, and 32 patellar   button.      SURGEON:  Madlyn Frankel. Charlann Boxer, M.D.      ASSISTANT:  Lanney Gins, PA-C.      ANESTHESIA:  Spinal.      SPECIMENS:  None.      COMPLICATION:  None.      DRAINS:  One Hemovac.  EBL: <150cc      TOURNIQUET TIME:   Total Tourniquet Time Documented: Thigh (Left) - 44 minutes Total: Thigh (Left) - 44 minutes  .      The patient was stable to the recovery room.      INDICATION FOR PROCEDURE:  [name] is a 72 y.o. female patient of   mine.  The patient had been seen, evaluated, and treated conservatively in the   office with medication, activity modification, and injections.  The patient had   radiographic changes of bone-on-bone arthritis with endplate sclerosis and osteophytes noted.      The patient failed conservative measures including medication, injections, and activity modification, and at this point was ready for more definitive measures.   Based on the radiographic changes and failed conservative measures, the patient   decided to proceed with total knee replacement.  Risks of infection,   DVT, component failure, need for  revision surgery, postop course, and   expectations were all   discussed and reviewed.  Consent was obtained for benefit of pain   relief.      PROCEDURE IN DETAIL:  The patient was brought to the operative theater.   Once adequate anesthesia, preoperative antibiotics, 2 gm of Ancef administered, the patient was positioned supine with the left thigh tourniquet placed.  The  left lower extremity was prepped and draped in sterile fashion.  A time-   out was performed identifying the patient, planned procedure, and   extremity.      The left lower extremity was placed in the [facility] leg holder.  The leg was   exsanguinated, tourniquet elevated to 250 mmHg.  A midline incision was   made followed by median parapatellar arthrotomy.  Following initial   exposure, attention was  first directed to the patella.  Precut   measurement was noted to be 19 mm.  I resected down to 14 mm and used a   32 patellar button to restore patellar height as well as cover the cut   surface.      The lug holes were drilled and a metal shim was placed to protect the   patella from retractors and saw blades.      At this point, attention was now directed to the femur.  The femoral   canal was opened with a drill, irrigated to try to prevent fat emboli.  An   intramedullary rod was passed at 3 degrees valgus, 10 mm of bone was   resected off the distal femur.  Following this resection, the tibia was   subluxated anteriorly.  Using the extramedullary guide, 6 mm of bone was resected off   the proximal medial tibia.  We confirmed the gap would be   stable medially and laterally with a 10 mm insert as well as confirmed   the cut was perpendicular in the coronal plane, checking with an alignment rod.      Once this was done, I sized the femur to be a size 2 in the anterior-   posterior dimension, chose a standard component based on medial and   lateral dimension.  The size 2 rotation block was then pinned in    position anterior referenced using the C-clamp to set rotation.  The   anterior, posterior, and  chamfer cuts were made without difficulty nor   notching making certain that I was along the anterior cortex to help   with flexion gap stability.      The final box cut was made off the lateral aspect of distal femur.      At this point, the tibia was sized to be a size 2, the size 2 tray was   then pinned in position through the medial third of the tubercle,   drilled, and keel punched.  Trial reduction was now carried with a 2 femur,  2 tibia, a 10 mm insert, and the 32 patella botton.  The knee was brought to   extension, full extension with good flexion stability with the patella   tracking through the trochlea without application of pressure.  Given   all these findings, the trial components removed.  Final components were   opened and cement was mixed.  The knee was irrigated with normal saline   solution and pulse lavage.  The synovial lining was   then injected with 0.25% Marcaine with epinephrine and 1 cc of Toradol,   total of 61 cc.      The knee was irrigated.  Final implants were then cemented onto clean and   dried cut surfaces of bone with the knee brought to extension with a 10 mm trial insert.      Once the cement had fully cured, the excess cement was removed   throughout the knee.  I confirmed I was satisfied with the range of   motion and stability, and the final 10 mm PS insert was chosen.  It was   placed into the knee.      The tourniquet had been let down at 44 minutes.  No significant   hemostasis required.  The medium Hemovac drain was placed deep.  The   extensor mechanism was then reapproximated using #1 Vicryl with the knee   in flexion.  The  remaining wound was closed with 2-0 Vicryl and running 4-0 Monocryl.   The knee was cleaned, dried, dressed sterilely using Dermabond and   Aquacel dressing.  Drain site dressed separately.  The patient was then    brought to recovery room in stable condition, tolerating the procedure   well.   Please note that Physician Assistant, Lanney Gins, was present for the entirety of the case, and was utilized for pre-operative positioning, peri-operative retractor management, general facilitation of the procedure.  He was also utilized for primary wound closure at the end of the case.              Madlyn Frankel Charlann Boxer, M.D.

## 2013-04-03 NOTE — Interval H&P Note (Signed)
History and Physical Interval Note:  04/03/2013 10:38 AM  [name]  has presented today for surgery, with the diagnosis of LEFT KNEE OA   The various methods of treatment have been discussed with the patient and family. After consideration of risks, benefits and other options for treatment, the patient has consented to  Procedure(s): LEFT TOTAL KNEE ARTHROPLASTY (Left) as a surgical intervention .  The patient's history has been reviewed, patient examined, no change in status, stable for surgery.  I have reviewed the patient's chart and labs.  Questions were answered to the patient's satisfaction.     [name]

## 2013-04-03 NOTE — Transfer of Care (Signed)
Immediate Anesthesia Transfer of Care Note  Patient: [name]  Procedure(s) Performed: Procedure(s): LEFT TOTAL KNEE ARTHROPLASTY (Left)  Patient Location: PACU  Anesthesia Type:Spinal  Level of Consciousness: awake, alert  and oriented  Airway & Oxygen Therapy: Patient Spontanous Breathing and Patient connected to face mask oxygen  Post-op Assessment: Report given to PACU RN and Post -op Vital signs reviewed and stable  Post vital signs: Reviewed and stable  Complications: No apparent anesthesia complications

## 2013-04-03 NOTE — Anesthesia Procedure Notes (Signed)

## 2013-04-03 NOTE — Anesthesia Preprocedure Evaluation (Signed)
Anesthesia Evaluation  Patient identified by MRN, date of birth, ID band Patient awake    Reviewed: Allergy & Precautions, H&P , NPO status , Patient's Chart, lab work & pertinent test results  Airway Mallampati: II TM Distance: >3 FB Neck ROM: Full    Dental no notable dental hx.    Pulmonary shortness of breath, COPD COPD inhaler,  Bronchogenic lung cancer, sees Dr. Arbutus Ped, Oncology and Dr. Tyrone Sage, Cardiothoracic surgery, and Dr. Marchelle Gearing, Pulm breath sounds clear to auscultation  + decreased breath sounds      Cardiovascular hypertension, Pt. on medications Rhythm:Regular Rate:Normal     Neuro/Psych negative neurological ROS  negative psych ROS   GI/Hepatic negative GI ROS, Neg liver ROS,   Endo/Other  negative endocrine ROS  Renal/GU negative Renal ROS  negative genitourinary   Musculoskeletal negative musculoskeletal ROS (+)   Abdominal   Peds negative pediatric ROS (+)  Hematology negative hematology ROS (+)   Anesthesia Other Findings   Reproductive/Obstetrics negative OB ROS                           Anesthesia Physical Anesthesia Plan  ASA: III  Anesthesia Plan: Spinal   Post-op Pain Management:    Induction: Intravenous  Airway Management Planned:   Additional Equipment:   Intra-op Plan:   Post-operative Plan:   Informed Consent: I have reviewed the patients History and Physical, chart, labs and discussed the procedure including the risks, benefits and alternatives for the proposed anesthesia with the patient or authorized representative who has indicated his/her understanding and acceptance.     Plan Discussed with: CRNA and Surgeon  Anesthesia Plan Comments:         Anesthesia Quick Evaluation

## 2013-04-03 NOTE — Preoperative (Signed)
Beta Blockers   Reason not to administer Beta Blockers:Not Applicable 

## 2013-04-03 NOTE — Progress Notes (Signed)
Utilization review completed.  

## 2013-04-04 ENCOUNTER — Encounter (HOSPITAL_COMMUNITY): Payer: Self-pay | Admitting: Orthopedic Surgery

## 2013-04-04 LAB — BASIC METABOLIC PANEL
CO2: 28 mEq/L (ref 19–32)
Calcium: 8.3 mg/dL — ABNORMAL LOW (ref 8.4–10.5)
Chloride: 99 mEq/L (ref 96–112)
GFR calc Af Amer: >90 mL/min (ref >90)
Sodium: 132 mEq/L — ABNORMAL LOW (ref 135–145)

## 2013-04-04 LAB — CBC
Platelets: 170 10*3/uL (ref 150–400)
RBC: 3.32 MIL/uL — ABNORMAL LOW (ref 3.87–5.11)
WBC: 8.4 10*3/uL (ref 4.0–10.5)

## 2013-04-04 MED ORDER — TIZANIDINE HCL 4 MG PO CAPS: 4.0000 mg | ORAL_CAPSULE | Freq: Three times a day (TID) | ORAL | Status: DC | PRN

## 2013-04-04 MED ORDER — PROMETHAZINE HCL 12.5 MG PO TABS: 12.5000 mg | ORAL_TABLET | Freq: Four times a day (QID) | ORAL | Status: DC | PRN

## 2013-04-04 MED ORDER — ASPIRIN 325 MG PO TBEC: 325.0000 mg | DELAYED_RELEASE_TABLET | Freq: Two times a day (BID) | ORAL | Status: AC

## 2013-04-04 MED ORDER — FERROUS SULFATE 325 (65 FE) MG PO TABS: 325.0000 mg | ORAL_TABLET | Freq: Three times a day (TID) | ORAL | Status: DC

## 2013-04-04 MED ORDER — POLYETHYLENE GLYCOL 3350 17 G PO PACK: 17.0000 g | PACK | Freq: Two times a day (BID) | ORAL | Status: DC

## 2013-04-04 MED ORDER — DSS 100 MG PO CAPS: 100.0000 mg | ORAL_CAPSULE | Freq: Two times a day (BID) | ORAL | Status: DC

## 2013-04-04 MED ORDER — HYDROCODONE-ACETAMINOPHEN 7.5-325 MG PO TABS: 1.0000 | ORAL_TABLET | ORAL | Status: DC

## 2013-04-04 NOTE — Care Management Note (Addendum)
    Page 1 of 1   04/05/2013     2:57:20 PM   CARE MANAGEMENT NOTE 04/05/2013  Patient:  [name], [name]   Account Number:  [id number]  Date Initiated:  04/04/2013  Documentation initiated by:  Colleen Can  Subjective/Objective Assessment:   dx Left Knee Osteoarthritis; total knee replacemnt.    Pre-arranged with Genevieve Norlander and services for HHpt will start day after patient is discharged.     Action/Plan:   CM spoke with patient. Plans are for pt to return to her home in [place] where spouse will be caregiver. She already has DME. [facility] will provide [facility] services.   Anticipated DC Date:  04/05/2013   Anticipated DC Plan:  HOME W HOME HEALTH SERVICES      DC Planning Services  CM consult      [facility]  HOME HEALTH   Choice offered to / List presented to:  C-1 Patient        HH arranged  HH-2 PT      [facility] agency  [facility]   Status of service:  Completed, signed off Medicare Important Message given?  NA - LOS <3 / Initial given by admissions (If response is "NO", the following Medicare IM given date fields will be blank) Date Medicare IM given:   Date Additional Medicare IM given:    Discharge Disposition:  HOME W HOME HEALTH SERVICES  Per UR Regulation:    If discussed at Long Length of Stay Meetings, dates discussed:    Comments:

## 2013-04-04 NOTE — Progress Notes (Signed)
OT Cancellation Note  Patient Details Name: [name] MRN: [id number] DOB: 10-22-1940   Cancelled Treatment:    Reason Eval/Treat Not Completed: PT screened, no needs identified, will sign off  Aja Bolander 04/04/2013, 12:10 PM Marica Otter, OTR/L [phone] 04/04/2013

## 2013-04-04 NOTE — Progress Notes (Signed)
/  Physical Therapy Treatment Patient Details Name: [name] MRN: [id number] DOB: 04-Jul-1941 Today's Date: 04/04/2013 Time: 7829-5621 PT Time Calculation (min): 12 min  PT Assessment / Plan / Recommendation  History of Present Illness s/p LTKA on 04/03/13   PT Comments     Follow Up Recommendations  Home health PT     Does the patient have the potential to tolerate intense rehabilitation     Barriers to Discharge        Equipment Recommendations  None recommended by PT    Recommendations for Other Services    Frequency 7X/week   Progress towards PT Goals Progress towards PT goals: Progressing toward goals  Plan Current plan remains appropriate    Precautions / Restrictions Precautions Precautions: Knee        Mobility  Bed Mobility Supine to Sit: 5: Supervision Sit to Supine: 5: Supervision Transfers Sit to Stand: 5: Supervision;From bed Stand to Sit: To bed;5: Supervision Details for Transfer Assistance: cues for LLE position Ambulation/Gait Ambulation/Gait Assistance: 4: Min guard Ambulation Distance (Feet): 80 Feet Assistive device: Rolling walker Ambulation/Gait Assistance Details: no dizziness, just very tired. Gait Pattern: Step-through pattern;Antalgic    Exercises     PT Diagnosis:    PT Problem List:   PT Treatment Interventions:     PT Goals (current goals can now be found in the care plan section)    Visit Information  Last PT Received On: 04/04/13 Assistance Needed: +1 History of Present Illness: s/p LTKA on 04/03/13    Subjective Data      Cognition  Cognition Arousal/Alertness: Awake/alert Behavior During Therapy: [facility] for tasks assessed/performed Overall Cognitive Status: Within Functional Limits for tasks assessed    Balance     End of Session PT - End of Session Activity Tolerance: Patient limited by pain Patient left: in bed;with call bell/phone within reach;with family/visitor present Nurse Communication: Mobility  status   GP     Rada Hay 04/04/2013, 4:31 PM

## 2013-04-04 NOTE — Progress Notes (Signed)
   Subjective: 1 Day Post-Op Procedure(s) (LRB): LEFT TOTAL KNEE ARTHROPLASTY (Left)   Patient reports pain as mild, pain well controlled. No events throughout the night. Ready to be discharged home if he does well with PT and pain well controlled.   Objective:   VITALS:   Filed Vitals:   04/04/13 1018  BP: 116/69  Pulse: 70  Temp: 98.5 F (36.9 C)  Resp: 18    Neurovascular intact Dorsiflexion/Plantar flexion intact Incision: dressing C/D/I No cellulitis present Compartment soft  LABS  Recent Labs  04/04/13 0500  HGB 10.5*  HCT 30.6*  WBC 8.4  PLT 170     Recent Labs  04/04/13 0500  NA 132*  K 4.2  BUN 6  CREATININE 0.72  GLUCOSE 119*     Assessment/Plan: 1 Day Post-Op Procedure(s) (LRB): LEFT TOTAL KNEE ARTHROPLASTY (Left) HV drain d/c'ed Foley cath d/c'ed   Advance diet Up with therapy D/C IV fluids Discharge home with home health Follow up in 2 weeks at [facility]. Follow up with OLIN,Delta Pichon D in 2 weeks.  Contact information:  [facility] [address], [address] [place] [address] [phone]    Expected ABLA  Treated with iron and will observe  Overweight (BMI 25-29.9) Estimated body mass index is 26.33 kg/(m^2) as calculated from the following:   Height as of this encounter: 5\' 2"  (1.575 m).   Weight as of this encounter: 65.318 kg (144 lb). Patient also counseled that weight may inhibit the healing process Patient counseled that losing weight will help with future health issues  Hyponatremia Treated with IV fluids and will observe       Anastasio Auerbach. Truman Aceituno   PAC  04/04/2013, 10:29 AM

## 2013-04-04 NOTE — Progress Notes (Signed)
Patient has her Symbicort MDI from home, and has been self administering it here. Patient has been encouraged to let RT staff administer Symbicort to better keep track of dosing.

## 2013-04-04 NOTE — Progress Notes (Signed)
Physical Therapy Treatment Patient Details Name: [name] MRN: [id number] DOB: 1941-03-01 Today's Date: 04/04/2013 Time: 4098-1191 PT Time Calculation (min): 27 min  PT Assessment / Plan / Recommendation  History of Present Illness s/p LTKA on 04/03/13   PT Comments   Pt continues to be dizzy. RN aware.Pt ambulated only 40'. Pt reports that she may not feel well enough to DC today. Will see again.  Follow Up Recommendations  Home health PT     Does the patient have the potential to tolerate intense rehabilitation     Barriers to Discharge        Equipment Recommendations  None recommended by PT    Recommendations for Other Services    Frequency 7X/week   Progress towards PT Goals Progress towards PT goals: Progressing toward goals  Plan Current plan remains appropriate    Precautions / Restrictions Precautions Precautions: Knee   Pertinent Vitals/Pain Pain increasing, RN notified. 6/10 L knee.   Mobility  Bed Mobility Bed Mobility: Sit to Supine Supine to Sit: 5: Supervision Sit to Supine: 5: Supervision Transfers Transfers: Sit to Stand;Stand to Sit Sit to Stand: 5: Supervision;From chair/3-in-1 Stand to Sit: To bed;5: Supervision Details for Transfer Assistance: cues for LLE position Ambulation/Gait Ambulation/Gait Assistance: 4: Min guard Ambulation Distance (Feet): 40 Feet Assistive device: Rolling walker Ambulation/Gait Assistance Details: continues with dizziness, increased pain. Gait Pattern: Step-to pattern;Antalgic    Exercises Total Joint Exercises Quad Sets: AROM;Left;10 reps Heel Slides: AROM;Left;10 reps Hip ABduction/ADduction: AROM;Left;10 reps Straight Leg Raises: AROM;Left;10 reps   PT Diagnosis: Difficulty walking  PT Problem List: Decreased mobility;Cardiopulmonary status limiting activity;Decreased knowledge of precautions;Decreased knowledge of use of DME PT Treatment Interventions: DME instruction;Gait training;Stair  training;Functional mobility training;Therapeutic activities;Therapeutic exercise;Patient/family education   PT Goals (current goals can now be found in the care plan section) Acute Rehab PT Goals Patient Stated Goal: to go home  Visit Information  Last PT Received On: 04/04/13 Assistance Needed: +1 History of Present Illness: s/p LTKA on 04/03/13    Subjective Data  Patient Stated Goal: to go home   Cognition  Cognition Arousal/Alertness: Awake/alert Behavior During Therapy: WFL for tasks assessed/performed Overall Cognitive Status: Within Functional Limits for tasks assessed    Balance     End of Session PT - End of Session Activity Tolerance: Patient limited by fatigue;Patient limited by pain Patient left: in bed;with call bell/phone within reach;with family/visitor present Nurse Communication: Mobility status   GP     Rada Hay 04/04/2013, 1:18 PM

## 2013-04-04 NOTE — Anesthesia Postprocedure Evaluation (Signed)
  Anesthesia Post-op Note  Patient: [name]  Procedure(s) Performed: Procedure(s) (LRB): LEFT TOTAL KNEE ARTHROPLASTY (Left)  Patient Location: PACU  Anesthesia Type: Spinal  Level of Consciousness: awake and alert   Airway and Oxygen Therapy: Patient Spontanous Breathing  Post-op Pain: mild  Post-op Assessment: Post-op Vital signs reviewed, Patient's Cardiovascular Status Stable, Respiratory Function Stable, Patent Airway and No signs of Nausea or vomiting  Last Vitals:  Filed Vitals:   04/04/13 1427  BP: 130/73  Pulse: 66  Temp: 37.3 C  Resp: 16    Post-op Vital Signs: stable   Complications: No apparent anesthesia complications

## 2013-04-04 NOTE — Progress Notes (Signed)
Physical Therapy Treatment Patient Details Name: [name] MRN: [id number] DOB: [birth year] Today's Date: 04/04/2013 Time: 4098-1191 PT Time Calculation (min): 27 min  PT Assessment / Plan / Recommendation  History of Present Illness s/p LTKA on 04/03/13   PT Comments   Pt with increased pain, RN brought meds. Pt continues to feel dizzy and is very drowsy.  Follow Up Recommendations  Home health PT     Does the patient have the potential to tolerate intense rehabilitation     Barriers to Discharge        Equipment Recommendations  None recommended by PT    Recommendations for Other Services    Frequency 7X/week   Progress towards PT Goals Progress towards PT goals: Progressing toward goals  Plan Current plan remains appropriate    Precautions / Restrictions Precautions Precautions: Knee   Pertinent Vitals/Pain ^ L knee    Mobility  Bed Mobility Bed Mobility: Sit to Supine Supine to Sit: 5: Supervision Sit to Supine: 5: Supervision Transfers Transfers: Sit to Stand;Stand to Sit Sit to Stand: 5: Supervision;From chair/3-in-1 Stand to Sit: To bed;5: Supervision Details for Transfer Assistance: cues for LLE position Ambulation/Gait Ambulation/Gait Assistance: 4: Min guard Ambulation Distance (Feet): 40 Feet Assistive device: Rolling walker Ambulation/Gait Assistance Details: continues with dizziness, increased pain. Gait Pattern: Step-to pattern;Antalgic    Exercises    PT Diagnosis: Difficulty walking  PT Problem List: Decreased mobility;Cardiopulmonary status limiting activity;Decreased knowledge of precautions;Decreased knowledge of use of DME PT Treatment Interventions: DME instruction;Gait training;Stair training;Functional mobility training;Therapeutic activities;Therapeutic exercise;Patient/family education   PT Goals (current goals can now be found in the care plan section) Acute Rehab PT Goals Patient Stated Goal: to go home  Visit Information  Last PT Received On: 04/04/13 Assistance Needed: +1 History of Present Illness: s/p LTKA on 04/03/13    Subjective Data  Patient Stated Goal: to go home   Cognition  Cognition Arousal/Alertness: Awake/alert Behavior During Therapy: WFL for tasks assessed/performed Overall Cognitive Status: Within Functional Limits for tasks assessed    Balance     End of Session PT - End of Session Activity Tolerance: Patient limited by fatigue;Patient limited by pain Patient left: in bed;with call bell/phone within reach;with family/visitor present Nurse Communication: Mobility status   GP     Rada Hay 04/04/2013, 11:36 AM

## 2013-04-04 NOTE — Progress Notes (Signed)
[facility]  [facility] is providing the following services: patient declined rw and commode - she already has both at home.   If patient discharges after hours, please call [phone].   Renard Hamper 04/04/2013, 8:21 AM

## 2013-04-05 LAB — BASIC METABOLIC PANEL
BUN: 5 mg/dL — ABNORMAL LOW (ref 6–23)
CO2: 28 mEq/L (ref 19–32)
Calcium: 8.4 mg/dL (ref 8.4–10.5)
Creatinine, Ser: 0.67 mg/dL (ref 0.50–1.10)
GFR calc non Af Amer: 88 mL/min — ABNORMAL LOW (ref >90)
Glucose, Bld: 119 mg/dL — ABNORMAL HIGH (ref 70–99)
Sodium: 131 mEq/L — ABNORMAL LOW (ref 135–145)

## 2013-04-05 LAB — CBC
HCT: 29.6 % — ABNORMAL LOW (ref 36.0–46.0)
Hemoglobin: 10.2 g/dL — ABNORMAL LOW (ref 12.0–15.0)
MCH: 31.4 pg (ref 26.0–34.0)
MCHC: 34.5 g/dL (ref 30.0–36.0)
MCV: 91.1 fL (ref 78.0–100.0)

## 2013-04-05 NOTE — Progress Notes (Signed)
   Subjective: 2 Days Post-Op Procedure(s) (LRB): LEFT TOTAL KNEE ARTHROPLASTY (Left)   Patient reports pain as mild, pain well controlled. No events throughout the night. Nausea and back pain have resolved. Ready to be discharged home.  Objective:   VITALS:   Filed Vitals:   04/05/13 0527  BP: 150/88  Pulse: 85  Temp: 98.6 F (37 C)  Resp: 16    Neurovascular intact Dorsiflexion/Plantar flexion intact Incision: dressing C/D/I No cellulitis present Compartment soft  LABS  Recent Labs  04/04/13 0500 04/05/13 0453  HGB 10.5* 10.2*  HCT 30.6* 29.6*  WBC 8.4 7.1  PLT 170 151     Recent Labs  04/04/13 0500 04/05/13 0453  NA 132* 131*  K 4.2 3.9  BUN 6 5*  CREATININE 0.72 0.67  GLUCOSE 119* 119*     Assessment/Plan: 2 Days Post-Op Procedure(s) (LRB): LEFT TOTAL KNEE ARTHROPLASTY (Left) Up with therapy Discharge home with home health Follow up in 2 weeks at [facility]. Follow up with OLIN,Zineb Glade D in 2 weeks.  Contact information:  [facility] [address] [place] [address] [phone]       Expected ABLA  Treated with iron and will observe   Overweight (BMI 25-29.9)  Estimated body mass index is 26.33 kg/(m^2) as calculated from the following:      Height as of this encounter: 5\' 2"  (1.575 m).      Weight as of this encounter: 65.318 kg (144 lb).  Patient also counseled that weight may inhibit the healing process  Patient counseled that losing weight will help with future health issues   Hyponatremia  Treated with IV fluids and will observe     Anastasio Auerbach. Ezrael Sam   PAC  04/05/2013, 7:55 AM

## 2013-04-05 NOTE — Progress Notes (Signed)
Physical Therapy Treatment Patient Details Name: [name] MRN: [id number] DOB: [birth year] Today's Date: 04/05/2013 Time: 4098-1191 PT Time Calculation (min): 22 min  PT Assessment / Plan / Recommendation  History of Present Illness s/p LTKA on 04/03/13   PT Comments   Pt feels better. Ready for DC.  Follow Up Recommendations        Does the patient have the potential to tolerate intense rehabilitation     Barriers to Discharge        Equipment Recommendations       Recommendations for Other Services    Frequency     Progress towards PT Goals Progress towards PT goals: Progressing toward goals  Plan Current plan remains appropriate    Precautions / Restrictions Precautions Precautions: Knee   Pertinent Vitals/Pain No dizziness to affect ambulation.    Mobility  Bed Mobility Supine to Sit: 7: Independent Sit to Supine: 7: Independent Transfers Sit to Stand: 6: Modified independent (Device/Increase time);From bed;From chair/3-in-1;With upper extremity assist Stand to Sit: 6: Modified independent (Device/Increase time);To chair/3-in-1;With upper extremity assist Ambulation/Gait Ambulation/Gait Assistance: 5: Supervision Ambulation Distance (Feet): 125 Feet Assistive device: Rolling walker Gait Pattern: Step-through pattern;Antalgic  Practiced 3 steps with 1 rail and 1 HH.   Exercises Total Joint Exercises Quad Sets: AROM;Left;10 reps Short Arc Quad: 10 reps Heel Slides: AROM;Left;10 reps Hip ABduction/ADduction: AROM;Left;10 reps Straight Leg Raises: AROM;Left;10 reps   PT Diagnosis:    PT Problem List:   PT Treatment Interventions:     PT Goals (current goals can now be found in the care plan section)    Visit Information  Last PT Received On: 04/05/13 Assistance Needed: +1 History of Present Illness: s/p LTKA on 04/03/13    Subjective Data      Cognition  Cognition Arousal/Alertness: Awake/alert    Balance     End of Session PT - End of  Session Activity Tolerance: Patient tolerated treatment well Patient left: in chair;with family/visitor present Nurse Communication: Mobility status   GP     Rada Hay 04/05/2013, 9:56 AM

## 2013-04-05 NOTE — Progress Notes (Signed)
Discharge summary sent to payer through [facility]  

## 2013-04-11 NOTE — Discharge Summary (Signed)
Physician Discharge Summary  Patient ID: [name] MRN: [id number] DOB/AGE: 70-May-1942 70 y.o.  Admit date: 04/03/2013 Discharge date: 04/05/2013   Procedures:  Procedure(s) (LRB): LEFT TOTAL KNEE ARTHROPLASTY (Left)  Attending Physician:  Dr. Durene Romans   Admission Diagnoses:   Left knee OA / pain.  Discharge Diagnoses:  Principal Problem:   S/P left TKA Active Problems:   Overweight (BMI 25.0-29.9)   Expected blood loss anemia   Hyponatremia  Past Medical History  Diagnosis Date  . COPD (chronic obstructive pulmonary disease)   . Depression   . Anxiety   . Gout     last time 6-57yrs ago   . Migraine   . OCD (obsessive compulsive disorder)   . Tinea corporis   . Acute bronchitis   . Allergic rhinitis   . Gout   . Macrocytosis   . Skin rash   . Hypertension     takes Amlodipine nightly  . Hyperlipidemia     taking Flax Seed Oil and Fish Oil  . Emphysema   . Hx of migraines 90's    after menopause HA stopped  . Arthritis     all over  . Osteopenia     takes Calcium and Vit D bid  . Back pain   . Liver cyst   . IBS (irritable bowel syndrome)   . Bilateral cataracts   . Hot flashes     takes Prozac daily  . Lung nodule     right  . Gallstones   . Arthritis   . Chicken pox   . Seasonal allergies   . Hypertension   . History of blood transfusion   . Pneumonia     x 2 ;last time back in 1999  . Lung cancer   . Anemia   . S/P right THA, AA - Dr. 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 Smoking 09/12/2011    HPI: [name], 72 y.o. female, has a history of pain and functional disability in the left knee due to arthritis and has failed non-surgical conservative treatments for greater than 12 weeks to include NSAID's and/or analgesics, corticosteriod injections, viscosupplementation injections and activity modification. Onset of symptoms was gradual, starting >10 years ago with gradually worsening course since that time. The  patient noted no past surgery on the left knee(s). Patient currently rates pain in the left knee(s) at 6 out of 10 with activity. Patient has night pain, worsening of pain with activity and weight bearing, pain that interferes with activities of daily living, pain with passive range of motion, crepitus and joint swelling. Patient has evidence of periarticular osteophytes and joint space narrowing by imaging studies. There is no active signs of infection. Risks, benefits and expectations were discussed with the patient. Patient understand the risks, benefits and expectations and wishes to proceed with surgery.   PCP: [name]., DO   Discharged Condition: good  Hospital Course:  Patient underwent the above stated procedure on 04/03/2013. Patient tolerated the procedure well and brought to the recovery room in good condition and subsequently to the floor.  POD #1 BP: 116/69 ; Pulse: 70 ; Temp: 98.5 F (36.9 C) ; Resp: 18 Pt's foley was removed, as well as the hemovac drain removed. IV was changed to a saline lock. Patient reports pain as mild, pain well controlled. No events throughout the night. Neurovascular intact, dorsiflexion/plantar flexion intact, incision: dressing C/D/I, no cellulitis present and compartment soft.  LABS  Basename    HGB  10.5  HCT  30.6   POD #2  BP: 150/88 ; Pulse: 85 ; Temp: 98.6 F (37 C) ; Resp: 16  Patient reports pain as mild, pain well controlled. No events throughout the night. Nausea and back pain have resolved. Ready to be discharged home. Neurovascular intact, dorsiflexion/plantar flexion intact, incision: dressing C/D/I, no cellulitis present and compartment soft.   LABS  Basename    HGB  10.2  HCT  29.6    Discharge Exam: General appearance: alert, cooperative and no distress Extremities: Homans sign is negative, no sign of DVT, no edema, redness or tenderness in the calves or thighs and no ulcers, gangrene or trophic changes  Disposition:    Home-Health Care Svc with follow up in 2 weeks   Follow-up Information   Follow up with [name], MD. Schedule an appointment as soon as possible for a visit in 2 days.   Specialty:  Orthopedic Surgery   Contact information:   [address] [place] [address] [phone]       Discharge Orders   Future Appointments Provider Department Dept Phone   05/15/2013 9:00 AM Delcie Roch [facility] MEDICAL ONCOLOGY [phone]   05/15/2013 10:00 AM [facility] [phone]   Patient to arrive 15 minutes prior to appointment time.   05/17/2013 10:00 AM Si Gaul, MD [facility] MEDICAL ONCOLOGY [phone]   Future Orders Complete By Expires   Call MD / Call 911  As directed    Comments:     If you experience chest pain or shortness of breath, CALL 911 and be transported to the hospital emergency room.  If you develope a fever above 101 F, pus (white drainage) or increased drainage or redness at the wound, or calf pain, call your surgeon's office.   Change dressing  As directed    Comments:     Maintain surgical dressing for 10-14 days, then change the dressing daily with sterile 4 x 4 inch gauze dressing and tape. Keep the area dry and clean.   Constipation Prevention  As directed    Comments:     Drink plenty of fluids.  Prune juice may be helpful.  You may use a stool softener, such as Colace (over the counter) 100 mg twice a day.  Use MiraLax (over the counter) for constipation as needed.   Diet - low sodium heart healthy  As directed    Discharge instructions  As directed    Comments:     Maintain surgical dressing for 10-14 days, then replace with gauze and tape. Keep the area dry and clean until follow up. Follow up in 2 weeks at [facility]. Call with any questions or concerns.   Increase activity slowly as tolerated  As directed    TED hose  As directed    Comments:       Use stockings (TED hose) for 2 weeks on both leg(s).  You may remove them at night for sleeping.   Weight bearing as tolerated  As directed         Medication List    STOP taking these medications       ibuprofen 200 MG tablet  Commonly known as:  ADVIL,MOTRIN      TAKE these medications       albuterol 108 (90 BASE) MCG/ACT inhaler  Commonly known as:  PROVENTIL HFA;VENTOLIN HFA  Inhale 2 puffs into the lungs every 6 (six) hours as needed for wheezing or shortness of breath.     amLODipine 5 MG tablet  Commonly known as:  NORVASC  Take 1 tablet (5 mg total) by mouth at bedtime.     aspirin 325 MG EC tablet  Take 1 tablet (325 mg total) by mouth 2 (two) times daily.     budesonide-formoterol 160-4.5 MCG/ACT inhaler  Commonly known as:  SYMBICORT  Inhale 2 puffs into the lungs 2 (two) times daily.     cetirizine 10 MG tablet  Commonly known as:  ZYRTEC  Take 10 mg by mouth daily as needed for allergies.     diphenhydrAMINE 25 MG tablet  Commonly known as:  BENADRYL  Take 25 mg by mouth every 6 (six) hours as needed for allergies.     DSS 100 MG Caps  Take 100 mg by mouth 2 (two) times daily.     ferrous sulfate 325 (65 FE) MG tablet  Take 1 tablet (325 mg total) by mouth 3 (three) times daily after meals.     FLUoxetine 20 MG tablet  Commonly known as:  PROZAC  Take 1 tablet (20 mg total) by mouth 2 (two) times daily.     GLUCOSAMINE HCL PO  Take 1,000 mg by mouth 2 (two) times daily.     HYDROcodone-acetaminophen 7.5-325 MG per tablet  Commonly known as:  NORCO  Take 1-2 tablets by mouth every 4 (four) hours.     polyethylene glycol packet  Commonly known as:  MIRALAX / GLYCOLAX  Take 17 g by mouth 2 (two) times daily.     promethazine 12.5 MG tablet  Commonly known as:  PHENERGAN  Take 1 tablet (12.5 mg total) by mouth every 6 (six) hours as needed for nausea.     tiZANidine 4 MG capsule  Commonly known as:  ZANAFLEX  Take 1 capsule (4 mg total)  by mouth 3 (three) times daily as needed for muscle spasms.     triamcinolone 55 MCG/ACT nasal inhaler  Commonly known as:  NASACORT  Place 2 sprays into the nose daily as needed (for allergies).         Signed: Anastasio Auerbach. Noha Karasik   PAC  04/11/2013, 1:49 PM

## 2013-05-15 ENCOUNTER — Other Ambulatory Visit (HOSPITAL_BASED_OUTPATIENT_CLINIC_OR_DEPARTMENT_OTHER): Payer: Medicare Other | Admitting: Lab

## 2013-05-15 ENCOUNTER — Other Ambulatory Visit: Payer: Medicare Other

## 2013-05-15 ENCOUNTER — Ambulatory Visit (HOSPITAL_COMMUNITY)
Admission: RE | Admit: 2013-05-15 | Discharge: 2013-05-15 | Disposition: A | Discharge status: Home / Self Care | Payer: Medicare Other | Source: Ambulatory Visit | Attending: Internal Medicine | Admitting: Internal Medicine

## 2013-05-15 DIAGNOSIS — C3491 Malignant neoplasm of unspecified part of right bronchus or lung: Secondary | ICD-10-CM

## 2013-05-15 DIAGNOSIS — C3410 Malignant neoplasm of upper lobe, unspecified bronchus or lung: Secondary | ICD-10-CM

## 2013-05-15 DIAGNOSIS — K829 Disease of gallbladder, unspecified: Secondary | ICD-10-CM | POA: Insufficient documentation

## 2013-05-15 DIAGNOSIS — R059 Cough, unspecified: Secondary | ICD-10-CM | POA: Insufficient documentation

## 2013-05-15 DIAGNOSIS — R05 Cough: Secondary | ICD-10-CM | POA: Insufficient documentation

## 2013-05-15 DIAGNOSIS — C3490 Malignant neoplasm of unspecified part of unspecified bronchus or lung: Secondary | ICD-10-CM | POA: Insufficient documentation

## 2013-05-15 DIAGNOSIS — K7689 Other specified diseases of liver: Secondary | ICD-10-CM | POA: Insufficient documentation

## 2013-05-15 LAB — CBC WITH DIFFERENTIAL/PLATELET
Basophils Absolute: 0.1 10*3/uL (ref 0.0–0.1)
Eosinophils Absolute: 0.3 10*3/uL (ref 0.0–0.5)
HGB: 12.8 g/dL (ref 11.6–15.9)
LYMPH%: 18.6 % (ref 14.0–49.7)
MCV: 91.4 fL (ref 79.5–101.0)
MONO#: 0.4 10*3/uL (ref 0.1–0.9)
MONO%: 7 % (ref 0.0–14.0)
NEUT#: 4.1 10*3/uL (ref 1.5–6.5)
Platelets: 215 10*3/uL (ref 145–400)
RBC: 4.14 10*6/uL (ref 3.70–5.45)
RDW: 13.1 % (ref 11.2–14.5)
WBC: 6.1 10*3/uL (ref 3.9–10.3)

## 2013-05-15 LAB — COMPREHENSIVE METABOLIC PANEL (CC13)
AST: 24 U/L (ref 5–34)
Alkaline Phosphatase: 78 U/L (ref 40–150)
BUN: 8.8 mg/dL (ref 7.0–26.0)
Creatinine: 0.8 mg/dL (ref 0.6–1.1)
Glucose: 95 mg/dl (ref 70–140)

## 2013-05-15 MED ORDER — IOHEXOL 300 MG/ML  SOLN
80.0000 mL | Freq: Once | INTRAMUSCULAR | Status: AC | PRN
  Administered 2013-05-15: 80 mL via INTRAVENOUS

## 2013-05-17 ENCOUNTER — Ambulatory Visit (HOSPITAL_BASED_OUTPATIENT_CLINIC_OR_DEPARTMENT_OTHER): Payer: Medicare Other | Admitting: Internal Medicine

## 2013-05-17 ENCOUNTER — Telehealth: Payer: Self-pay | Admitting: Internal Medicine

## 2013-05-17 ENCOUNTER — Encounter: Payer: Self-pay | Admitting: Internal Medicine

## 2013-05-17 VITALS — BP 128/79 | HR 73 | Temp 97.8°F | Resp 18 | Ht 62.0 in | Wt 140.5 lb

## 2013-05-17 DIAGNOSIS — C3491 Malignant neoplasm of unspecified part of right bronchus or lung: Secondary | ICD-10-CM

## 2013-05-17 DIAGNOSIS — C3410 Malignant neoplasm of upper lobe, unspecified bronchus or lung: Secondary | ICD-10-CM

## 2013-05-17 DIAGNOSIS — J449 Chronic obstructive pulmonary disease, unspecified: Secondary | ICD-10-CM

## 2013-05-17 NOTE — Progress Notes (Signed)
[facility] [phone]   [phone]  OFFICE PROGRESS NOTE  [name]., DO [address] 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   PRIOR THERAPY: Status post wedge resection of the right upper lobe lung nodule under the care of Dr. Dorris Fetch on 10/20/2011.   CURRENT THERAPY: Observation.  INTERVAL HISTORY: [name] 72 y.o. female returns to the clinic today for six-month followup visit. The patient is feeling fine today with no specific complaints. She denied having any significant chest pain, shortness breath, cough or hemoptysis. She denied having any weight loss or night sweats. The patient had repeat CT scan of the chest performed recently and she is here for evaluation and discussion of her scan results.   MEDICAL HISTORY: Past Medical History  Diagnosis Date  . COPD (chronic obstructive pulmonary disease)   . Depression   . Anxiety   . Gout     last time 6-34yrs ago   . Migraine   . OCD (obsessive compulsive disorder)   . Tinea corporis   . Acute bronchitis   . Allergic rhinitis   . Gout   . Macrocytosis   . Skin rash   . Hypertension     takes Amlodipine nightly  . Hyperlipidemia     taking Flax Seed Oil and Fish Oil  . Emphysema   . Hx of migraines 90's    after menopause HA stopped  . Arthritis     all over  . Osteopenia     takes Calcium and Vit D bid  . Back pain   . Liver cyst   . IBS (irritable bowel syndrome)   . Bilateral cataracts   . Hot flashes     takes Prozac daily  . Lung nodule     right  . Gallstones   . Arthritis   . Chicken pox   . Seasonal allergies   . Hypertension   . History of blood transfusion   . Pneumonia     x 2 ;last time back in 1999  . Lung cancer   . Anemia   . S/P right THA, AA - Dr. 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  Smoking 09/12/2011    ALLERGIES:  is allergic to clindamycin/lincomycin; penicillins; spiriva; and sulfa antibiotics.  MEDICATIONS:  Current Outpatient Prescriptions  Medication Sig Dispense Refill  . albuterol (PROVENTIL HFA;VENTOLIN HFA) 108 (90 BASE) MCG/ACT inhaler Inhale 2 puffs into the lungs every 6 (six) hours as needed for wheezing or shortness of breath.       Marland Kitchen amLODipine (NORVASC) 5 MG tablet Take 1 tablet (5 mg total) by mouth at bedtime.  90 tablet  3  . budesonide-formoterol (SYMBICORT) 160-4.5 MCG/ACT inhaler Inhale 2 puffs into the lungs 2 (two) times daily.      . cetirizine (ZYRTEC) 10 MG tablet Take 10 mg by mouth daily as needed for allergies.       . diphenhydrAMINE (BENADRYL) 25 MG tablet Take 25 mg by mouth every 6 (six) hours as needed for allergies.       Marland Kitchen FLUoxetine (PROZAC) 20 MG tablet Take 1 tablet (20 mg total) by mouth 2 (two) times daily.  90 tablet  3  . GLUCOSAMINE HCL PO Take 1,000 mg by mouth 2 (two) times daily.      Marland Kitchen  tiZANidine (ZANAFLEX) 4 MG capsule Take 1 capsule (4 mg total) by mouth 3 (three) times daily as needed for muscle spasms.  50 capsule  0  . triamcinolone (NASACORT) 55 MCG/ACT nasal inhaler Place 2 sprays into the nose daily as needed (for allergies).      . [DISCONTINUED] Calcium Carbonate-Vitamin D (CALCIUM + D PO) Take by mouth daily. 500+400        No current facility-administered medications for this visit.    SURGICAL HISTORY:  Past Surgical History  Procedure Laterality Date  . Inguinal hernia repair  2000  . Neuroplasty decompression median nerve at carpal tunnel    . Rotator cuff repair  2001    right  . Tonsillectomy    . Tonsillectomy  as a child    and adenoids  . Dilation and curettage of uterus      at age 34  . Cyst removed  >53yrs ago    from left leg  . Carpal tunnel release  80's    right   . Total abdominal hysterectomy  2003  . Total hip arthroplasty  2011    left  . Colonoscopy    . Video bronchoscopy   10/20/2011    Procedure: VIDEO BRONCHOSCOPY;  Surgeon: [name], MD;  Location: [facility] OR;  Service: Thoracic;  Laterality: N/A;  . Wedge resection  10-20-2011    rt lung  - for lung cancer  . Total hip arthroplasty  03/07/2012    Procedure: TOTAL HIP ARTHROPLASTY ANTERIOR APPROACH;  Surgeon: [name], MD;  Location: [facility] ORS;  Service: Orthopedics;  Laterality: Right;  . Total knee arthroplasty Right 01/02/2013    Procedure: RIGHT TOTAL KNEE ARTHROPLASTY;  Surgeon: [name], MD;  Location: [facility] ORS;  Service: Orthopedics;  Laterality: Right;  . Total knee arthroplasty Left 04/03/2013    Procedure: LEFT TOTAL KNEE ARTHROPLASTY;  Surgeon: [name], MD;  Location: [facility] ORS;  Service: Orthopedics;  Laterality: Left;    REVIEW OF SYSTEMS:  A comprehensive review of systems was negative.   PHYSICAL EXAMINATION: General appearance: alert, cooperative and no distress Head: Normocephalic, without obvious abnormality, atraumatic Neck: no adenopathy, no JVD, supple, symmetrical, trachea midline and thyroid not enlarged, symmetric, no tenderness/mass/nodules Lymph nodes: Cervical, supraclavicular, and axillary nodes normal. Resp: clear to auscultation bilaterally Cardio: regular rate and rhythm, S1, S2 normal, no murmur, click, rub or gallop GI: soft, non-tender; bowel sounds normal; no masses,  no organomegaly Extremities: extremities normal, atraumatic, no cyanosis or edema  ECOG PERFORMANCE STATUS: 1 - Symptomatic but completely ambulatory  Blood pressure 128/79, pulse 73, temperature 97.8 F (36.6 C), temperature source Oral, resp. rate 18, height 5\' 2"  (1.575 m), weight 140 lb 8 oz (63.73 kg), SpO2 100.00%.  LABORATORY DATA: Lab Results  Component Value Date   WBC 6.1 05/15/2013   HGB 12.8 05/15/2013   HCT 37.8 05/15/2013   MCV 91.4 05/15/2013   PLT 215 05/15/2013      Chemistry      Component Value Date/Time   NA 137 05/15/2013 0850   NA 131* 04/05/2013 0453   K 3.9  05/15/2013 0850   K 3.9 04/05/2013 0453   CL 98 04/05/2013 0453   CL 103 11/13/2012 0939   CO2 25 05/15/2013 0850   CO2 28 04/05/2013 0453   BUN 8.8 05/15/2013 0850   BUN 5* 04/05/2013 0453   CREATININE 0.8 05/15/2013 0850   CREATININE 0.67 04/05/2013 0453  Component Value Date/Time   CALCIUM 9.5 05/15/2013 0850   CALCIUM 8.4 04/05/2013 0453   ALKPHOS 78 05/15/2013 0850   ALKPHOS 44 10/22/2011 0335   AST 24 05/15/2013 0850   AST 20 10/22/2011 0335   ALT 29 05/15/2013 0850   ALT 14 10/22/2011 0335   BILITOT 0.55 05/15/2013 0850   BILITOT 0.5 10/22/2011 0335       RADIOGRAPHIC STUDIES: Ct Chest W Contrast  05/15/2013   CLINICAL DATA:  Followup lung cancer, right lung surgery, cough  EXAM: CT CHEST WITH CONTRAST  TECHNIQUE: Multidetector CT imaging of the chest was performed during intravenous contrast administration.  CONTRAST:  80mL OMNIPAQUE IOHEXOL 300 MG/ML  SOLN  COMPARISON:  11/13/2012  FINDINGS: Moderate atherosclerotic change of the aorta and coronary arteries. No significant hilar or mediastinal adenopathy. No pleural effusion. Stable diffuse emphysematous change and stable postoperative right upper lobe subparagraph incomplete upper abdominal imaging again demonstrates a splenule and numerous low-attenuation liver lesions consistent with cysts. Partial visualization of the gallbladder demonstrates a lobulated calcified 2 cm mass within the gallbladder lumen presumably a stone.  There are no acute musculoskeletal findings. Scarring. No mass or infiltrate.  IMPRESSION: Evidence of partial right upper lobectomy, with no evidence of recurrence.   Electronically Signed   By: Esperanza Heir M.D.   On: 05/15/2013 10:13    ASSESSMENT AND PLAN: This is a very pleasant 72 years old white female with history of stage IA non-small cell lung cancer, squamous cell carcinoma diagnosed in March of 2013 status post wedge resection of the right upper lobe and currently on observation. The patient is doing fine  and she has no evidence for disease recurrence. I discussed the scan results with her and recommended for her to continue on observation with repeat CT scan of the chest in 6 months. She was advised to call immediately if she has any concerning symptoms in the interval.  The patient voices understanding of current disease status and treatment options and is in agreement with the current care plan.  All questions were answered. The patient knows to call the clinic with any problems, questions or concerns. We can certainly see the patient much sooner if necessary.

## 2013-05-17 NOTE — Patient Instructions (Signed)
Followup in 6 months with repeat CT scan of the chest.

## 2013-05-17 NOTE — Telephone Encounter (Signed)
gv and printed appt sched and avs for pt for April 2015  °

## 2013-05-18 ENCOUNTER — Ambulatory Visit (INDEPENDENT_AMBULATORY_CARE_PROVIDER_SITE_OTHER): Payer: Medicare Other

## 2013-05-18 DIAGNOSIS — Z23 Encounter for immunization: Secondary | ICD-10-CM

## 2013-06-13 ENCOUNTER — Ambulatory Visit: Payer: Medicare Other | Admitting: Internal Medicine

## 2013-06-18 ENCOUNTER — Other Ambulatory Visit: Payer: Self-pay | Admitting: Family Medicine

## 2013-06-18 DIAGNOSIS — Z1231 Encounter for screening mammogram for malignant neoplasm of breast: Secondary | ICD-10-CM

## 2013-06-22 ENCOUNTER — Ambulatory Visit (INDEPENDENT_AMBULATORY_CARE_PROVIDER_SITE_OTHER): Payer: Medicare Other | Admitting: Internal Medicine

## 2013-06-22 ENCOUNTER — Encounter: Payer: Self-pay | Admitting: Internal Medicine

## 2013-06-22 ENCOUNTER — Other Ambulatory Visit: Payer: Medicare Other

## 2013-06-22 VITALS — BP 112/70 | HR 78 | Ht 62.0 in | Wt 142.0 lb

## 2013-06-22 DIAGNOSIS — I10 Essential (primary) hypertension: Secondary | ICD-10-CM

## 2013-06-22 DIAGNOSIS — J329 Chronic sinusitis, unspecified: Secondary | ICD-10-CM

## 2013-06-22 DIAGNOSIS — J449 Chronic obstructive pulmonary disease, unspecified: Secondary | ICD-10-CM

## 2013-06-22 LAB — PULMONARY FUNCTION TEST

## 2013-06-22 NOTE — Patient Instructions (Signed)
#  chronic rhinosinusitis - continue nasal steroids  - but in addition, part 2.7% nasal saline spray by a company called [employer] med available over-the-counter  #COPD  Gold stge 2  - stable.  - stop symbicort - start ANORO  Daily (discussed in detail) - alpha 1 genetic check up 06/22/2013; - glad you had flu shot   #lung cancer surveillance  - through Dr Arbutus Ped  #followup  - 9 months with CAT score

## 2013-06-22 NOTE — Progress Notes (Signed)
Subjective:    Patient ID: [name], female    DOB: Jan 14, 1941, 72 y.o.   MRN: [id number]  HPI     PMD is Dr Toula Moos.   #Known spring allergies,   #IBS, OA - knees and hip and does regular water exercises.   #Drinks 2 coors lite each night   -CAGE -negative early 2013.    #Ex-Smoker  - 25 pack-year smoking history. Quit 2013 after diagnosis of lung cancer.  #COPD - 2013: Pre RUL lobectomy and Pre Rx: fev1 1.1L/60% , DLCO 9.6/56%, TLC 5.6/128%  - 2013 May: Post lobectomy and post Rx: fev1: 1.28L/60%, ratio 49 . No desat walk test - Rx spiriva, symbicort since spring 2013  # STage 1 lung cancer: pT1b, pN0 Stage 08 October 2011  - s/p lobectomy by Dr Tyrone Sage  - surveillance by Dr Shirline Frees  - 05/15/13 - Clear CT. No recurrence   OV 07/17/2012  FU COPD, ex- smoker, lung cancer - s/p lobectomy   -COPD - CAT score 6. OVerall well but does have dyspnea on exertion for 1 flight of stairs that is stable and old.  COmpliant with symbicort. Has had flu shot. Walking desaturation test on 07/17/2012 185 feet x 3 laps:  did NOT desaturate. Rest pulse ox was 95%, final pulse ox was 90%. HR response 77/min at rest to 93/min at peak exertion.    - Smoking: still in remission  - Lung cancer: having surveillance through Dr Arbutus Ped. No new issues   - new problem: last several days to a week: increased sinus congestion and post nasal drip associated with increased cough. Sick contact + (husband). No associated fever.   OV 06/22/2013  Chief Complaint  Patient presents with  . COPD    follow-up. Pt states breathing is stable. She states she had knee surgery this summer and there was not any issues there.     Followup COPD: She is doing really well. COPD is at baseline. Cat score is 9 and reflected below and symptom score. She pulmonary function test today 06/22/2013. FEV1 is 1.3 L/62%. No post bronchodilator change. FVC is 2.6 L postop day later which is 95%. Ratio is 50. Total lung  capacity is 4.96L/104%. DLCO is level 0.7/54%. All consistent with Gold stage II COPD. Has not had any interim exacerbations. She had a flu shot this season. Of note, she is on Symbicort which controls her symptoms well but she still does have a COPD cat score of 9. She is exploring if she can be better than that. She's also not interested in long-term effects of inhaled corticosteroids and is interested in trying one of the newer inhalers   Regarding lung cancer: She had a CT scan of the chest 05/15/2013. I personally review this this no recurrence of lung cancer. Surveillance is being done by Dr. Gwenyth Bouillon.  Chronic sinus drainage: This continues to be a problem despite nasal steroids. I noticed that she's not on nasal saline spray  CAT COPD Symptom & Quality of Life Score (GSK trademark) 0 is no burden. 5 is highest burden 07/17/2012  06/22/2013 Post knee replacement  Never Cough -> Cough all the time 2 2  No phlegm in chest -> Chest is full of phlegm 0 2  No chest tightness -> Chest feels very tight 0 1  No dyspnea for 1 flight stairs/hill -> Very dyspneic for 1 flight of stairs 2 1  No limitations for ADL at home -> Very limited with ADL at  home 0 0  Confident leaving home -> Not at all confident leaving home 0 0  Sleep soundly -> Do not sleep soundly because of lung condition 0 2  Lots of Energy -> No energy at all 2 1  TOTAL Score (max 40)  6 9     Review of Systems  Constitutional: Negative for fever and unexpected weight change.  HENT: Negative for congestion, dental problem, ear pain, nosebleeds, postnasal drip, rhinorrhea, sinus pressure, sneezing, sore throat and trouble swallowing.   Eyes: Negative for redness and itching.  Respiratory: Negative for cough, chest tightness, shortness of breath and wheezing.   Cardiovascular: Negative for palpitations and leg swelling.  Gastrointestinal: Negative for nausea and vomiting.  Genitourinary: Negative for dysuria.  Musculoskeletal:  Negative for joint swelling.  Skin: Negative for rash.  Neurological: Negative for headaches.  Hematological: Does not bruise/bleed easily.  Psychiatric/Behavioral: Negative for dysphoric mood. The patient is not nervous/anxious.        Objective:   Physical Exam  Vitals reviewed. Constitutional: She is oriented to person, place, and time. She appears well-developed and well-nourished. No distress.  HENT:  Head: Normocephalic and atraumatic.  Right Ear: External ear normal.  Left Ear: External ear normal.  Mouth/Throat: Oropharynx is clear and moist. No oropharyngeal exudate.  Eyes: Conjunctivae and EOM are normal. Pupils are equal, round, and reactive to light. Right eye exhibits no discharge. Left eye exhibits no discharge. No scleral icterus.  Neck: Normal range of motion. Neck supple. No JVD present. No tracheal deviation present. No thyromegaly present.  Cardiovascular: Normal rate, regular rhythm, normal heart sounds and intact distal pulses.  Exam reveals no gallop and no friction rub.   No murmur heard. Pulmonary/Chest: Effort normal and breath sounds normal. No respiratory distress. She has no wheezes. She has no rales. She exhibits no tenderness.  Abdominal: Soft. Bowel sounds are normal. She exhibits no distension and no mass. There is no tenderness. There is no rebound and no guarding.  Musculoskeletal: Normal range of motion. She exhibits no edema and no tenderness.  Lymphadenopathy:    She has no cervical adenopathy.  Neurological: She is alert and oriented to person, place, and time. She has normal reflexes. No cranial nerve deficit. She exhibits normal muscle tone. Coordination normal.  Skin: Skin is warm and dry. No rash noted. She is not diaphoretic. No erythema. No pallor.  Psychiatric: She has a normal mood and affect. Her behavior is normal. Judgment and thought content normal.          Assessment & Plan:

## 2013-06-22 NOTE — Progress Notes (Signed)
PFT done today. 

## 2013-06-24 NOTE — Assessment & Plan Note (Signed)
 #  COPD  Gold stge 2  - stable.  - stop symbicort - start ANORO  Daily (discussed in detail) - alpha 1 genetic check up 06/22/2013; - glad you had flu shot   #lung cancer surveillance  - through Dr Arbutus Ped  #followup  - 9 months with CAT score

## 2013-06-24 NOTE — Assessment & Plan Note (Signed)
#  chronic rhinosinusitis - continue nasal steroids  - but in addition, part 2.7% nasal saline spray by a company called [employer] med available over-the-counter

## 2013-06-29 ENCOUNTER — Ambulatory Visit (INDEPENDENT_AMBULATORY_CARE_PROVIDER_SITE_OTHER): Payer: Medicare Other | Admitting: Family Medicine

## 2013-06-29 ENCOUNTER — Encounter: Payer: Self-pay | Admitting: Family Medicine

## 2013-06-29 VITALS — BP 126/84 | Temp 98.2°F | Ht 62.0 in | Wt 143.0 lb

## 2013-06-29 DIAGNOSIS — Z0000 Encounter for general adult medical examination without abnormal findings: Secondary | ICD-10-CM

## 2013-06-29 DIAGNOSIS — F329 Major depressive disorder, single episode, unspecified: Secondary | ICD-10-CM

## 2013-06-29 DIAGNOSIS — F3289 Other specified depressive episodes: Secondary | ICD-10-CM

## 2013-06-29 DIAGNOSIS — L259 Unspecified contact dermatitis, unspecified cause: Secondary | ICD-10-CM

## 2013-06-29 DIAGNOSIS — L309 Dermatitis, unspecified: Secondary | ICD-10-CM

## 2013-06-29 DIAGNOSIS — F32A Depression, unspecified: Secondary | ICD-10-CM

## 2013-06-29 MED ORDER — FLUOXETINE HCL 20 MG PO TABS: 20.0000 mg | ORAL_TABLET | Freq: Two times a day (BID) | ORAL | Status: DC

## 2013-06-29 MED ORDER — TRIAMCINOLONE ACETONIDE 0.1 % EX CREA: 1.0000 "application " | TOPICAL_CREAM | Freq: Two times a day (BID) | CUTANEOUS | Status: DC

## 2013-06-29 NOTE — Progress Notes (Addendum)
Medicare Annual Preventive Care Visit  (initial annual wellness or annual wellness exam)  Depression and Hot Flashes: -needs refills on prozac takes 20mg  bid -stable on medication without depressed mood  Dermatitis: -itchy rash for a week or two after gardening -on arms -wants steroid cream for this  1.) Patient-completed health risk assessment  - completed and reviewed, see scanned documentation  2.) Review of Medical History: -PMH, PSH, Family History and current specialty and care providers reviewed and updated and listed below  - see chart and below  3.) Review of functional ability and level of safety:  Any difficulty hearing? NO  History of falling? NO  Any trouble with IADLs - using a phone, using transportation, grocery shopping, preparing meals, doing housework, doing laundry, taking medications and managing money? NO  Advance Directives? YES   See summary of recommendations in Patient Instructions below.  4.) Physical Exam Filed Vitals:   06/29/13 0934  BP: 126/84  Temp: 98.2 F (36.8 C)   Estimated body mass index is 26.15 kg/(m^2) as calculated from the following:   Height as of this encounter: 5\' 2"  (1.575 m).   Weight as of this encounter: 143 lb (64.864 kg).  Mini Cog: 1. Patient instructed to listen carefully and repeat the following: Apple Watch    Penny  2. Clock drawing test was administered: NORMAL     ABNORMAL  3. Recall of three words: 3/3  Scoring:  Number of words recalled? 3/3 If 1-2 --> is CDT normal or abnormal? Normal Patient Score: NEG screen for cog impairment  See patient instructions for recommendations.  4)The following written screening schedule of preventive measures were reviewed with assessment and plan made per below, orders and patient instructions:         Alcohol screening: screening and counseling done     Obesity Screening and counseling: screening and counseling     Tobacco Screening: none    Pneumococcal (PPSV23 -one dose after 64, one before if risk factors), influenza yearly and hepatitis B vaccines (if high risk - end stage renal disease, IV drugs, homosexual men, live in home for mentally retarded, hemophilia receiving factors) ASSESSMENT/PLAN: done      Screening mammograph (yearly if >40) ASSESSMENT/PLAN: scheduled for december      Colorectal cancer screening (FOBT yearly or flex sig q4y or colonoscopy q10y or barium enema q4y) ASSESSMENT/PLAN: had 5 years ago and was normal      Bone mass measurements(covered q2y if indicated - estrogen def, osteoporosis, hyperparathyroid, vertebral abnormalities, osteoporosis or steroids) ASSESSMENT/PLAN: scheduled for december      Screening for glaucoma(q1y if high risk - diabetes, FH, AA and > 50 or hispanic and > 65) ASSESSMENT/PLAN: has yearly and normal - sees eye doctor      Cardiovascular screening blood tests (lipids q5y) ASSESSMENT/PLAN:  Had last year and ok, mild hyperlipidemia - prefers to wait to check next time      Diabetes screening tests ASSESSMENT/PLAN: done last year   7.) Summary: -risk factors and conditions per above assessment were discussed and treatment, recommendations and referrals were offered per documentation above and orders and patient instructions.

## 2013-06-29 NOTE — Progress Notes (Signed)
Pre visit review using our clinic review tool, if applicable. No additional management support is needed unless otherwise documented below in the visit note. 

## 2013-06-29 NOTE — Patient Instructions (Signed)
Alcohol screening: screening and counseling done     Obesity Screening and counseling: screening and counseling     Tobacco Screening: none       Pneumococcal (PPSV23 -one dose after 64, one before if risk factors), influenza yearly and hepatitis B vaccines (if high risk - end stage renal disease, IV drugs, homosexual men, live in home for mentally retarded, hemophilia receiving factors) ASSESSMENT/PLAN: done      Screening mammograph (yearly if >40) ASSESSMENT/PLAN: scheduled for december      Colorectal cancer screening (FOBT yearly or flex sig q4y or colonoscopy q10y or barium enema q4y) ASSESSMENT/PLAN: had 5 years ago and was normal      Bone mass measurements(covered q2y if indicated - estrogen def, osteoporosis, hyperparathyroid, vertebral abnormalities, osteoporosis or steroids) ASSESSMENT/PLAN: scheduled for december      Screening for glaucoma(q1y if high risk - diabetes, FH, AA and > 50 or hispanic and > 65) ASSESSMENT/PLAN: has yearly and normal - sees eye doctor      Cardiovascular screening blood tests (lipids q5y) ASSESSMENT/PLAN:  Had last year and ok, mild hyperlipidemia - prefers to wait to check next time      Diabetes screening tests

## 2013-06-30 LAB — ALPHA-1 ANTITRYPSIN PHENOTYPE: A-1 Antitrypsin: 123 mg/dL (ref 83–199)

## 2013-07-11 ENCOUNTER — Ambulatory Visit (HOSPITAL_COMMUNITY)
Admission: RE | Admit: 2013-07-11 | Discharge: 2013-07-11 | Disposition: A | Discharge status: Home / Self Care | Payer: Medicare Other | Source: Ambulatory Visit | Attending: Family Medicine | Admitting: Family Medicine

## 2013-07-11 DIAGNOSIS — Z780 Asymptomatic menopausal state: Secondary | ICD-10-CM | POA: Insufficient documentation

## 2013-07-11 DIAGNOSIS — M8580 Other specified disorders of bone density and structure, unspecified site: Secondary | ICD-10-CM

## 2013-07-11 DIAGNOSIS — Z1231 Encounter for screening mammogram for malignant neoplasm of breast: Secondary | ICD-10-CM | POA: Insufficient documentation

## 2013-07-11 DIAGNOSIS — Z13820 Encounter for screening for osteoporosis: Secondary | ICD-10-CM | POA: Insufficient documentation

## 2013-07-13 ENCOUNTER — Encounter: Payer: Self-pay | Admitting: Internal Medicine

## 2013-10-12 ENCOUNTER — Telehealth: Payer: Self-pay | Admitting: Internal Medicine

## 2013-10-12 NOTE — Telephone Encounter (Signed)
s.w. pt and advised on 4.9.15 appt moved to 4.13 pt MD on pal....pt ok and aware of new d.t

## 2013-10-21 ENCOUNTER — Other Ambulatory Visit: Payer: Self-pay | Admitting: Internal Medicine

## 2013-10-25 ENCOUNTER — Telehealth: Payer: Self-pay | Admitting: Internal Medicine

## 2013-10-25 MED ORDER — TIOTROPIUM BROMIDE MONOHYDRATE 2.5 MCG/ACT IN AERS: 2.0000 | INHALATION_SPRAY | Freq: Every day | RESPIRATORY_TRACT | Status: DC

## 2013-10-25 NOTE — Telephone Encounter (Signed)
Pt advised sample at front and we will instruct her on use when she picks up samples. [place] Bing, CMA

## 2013-10-25 NOTE — Telephone Encounter (Signed)
LAst visit this is what I noted: " PFT  consistent with Gold stage II COPD. Has not had any interim exacerbations. She is on Symbicort which controls her symptoms well but she still does have a COPD cat score of 9. She is exploring if she can be better than that. She's also not interested in long-term effects of inhaled corticosteroids and is interested in trying one of the newer inhalers "  This is why we went with anoro - because she wanted to get off steroids, and she did not tolerate spiriva and she wanted to do better than the mild symptoms she had. So ANORO being new was a trial to see if it would work   Yes, respimat is new. Was not there at time of her last ov. She can try it give it to her  Dr. Brand Males, M.D., [facility].C.P Pulmonary and Critical Care Medicine Staff Physician [facility] Pager: [phone], If no answer or between  15:00h - 7:00h: call [phone]  10/25/2013 12:40 PM

## 2013-10-25 NOTE — Telephone Encounter (Signed)
lmtcb x1 At her last OV, MR stopped Symbicort.

## 2013-10-25 NOTE — Telephone Encounter (Signed)
Pt returning call.[name] ° °

## 2013-10-25 NOTE — Telephone Encounter (Signed)
Per the 11.14.14 ov w/ MR: Patient Instructions     #chronic rhinosinusitis  - continue nasal steroids  - but in addition, part 2.7% nasal saline spray by a company called [employer] med available over-the-counter  #COPD Gold stge 2  - stable.  - stop symbicort  - start ANORO Daily (discussed in detail)  - alpha 1 genetic check up 06/22/2013;  - glad you had flu shot  #lung cancer surveillance  - through Dr Julien Nordmann  #followup  - 9 months with CAT score   Called spoke with patient who reported that she stopped the Anoro because she did not feel that it was effective for.  She resumed her Symbicort shortly after her last ov and is requesting a refill on this medication.  Also, pt asked why MR recommended she stop the Symbicort > was it d/t her PFT results?  Pt also reported that she had taken Spiriva Handihaler in the past but could not tolerate this do to the powder causing a cough.  She has seen the advertisements for the Ramona and wonders if this would be an option for her?  MR please advise, thank you.

## 2013-11-14 ENCOUNTER — Telehealth: Payer: Self-pay | Admitting: Internal Medicine

## 2013-11-14 ENCOUNTER — Ambulatory Visit (HOSPITAL_COMMUNITY)
Admission: RE | Admit: 2013-11-14 | Discharge: 2013-11-14 | Disposition: A | Discharge status: Home / Self Care | Payer: Medicare Other | Source: Ambulatory Visit | Attending: Internal Medicine | Admitting: Internal Medicine

## 2013-11-14 ENCOUNTER — Other Ambulatory Visit (HOSPITAL_BASED_OUTPATIENT_CLINIC_OR_DEPARTMENT_OTHER): Payer: Medicare Other

## 2013-11-14 DIAGNOSIS — I700 Atherosclerosis of aorta: Secondary | ICD-10-CM | POA: Insufficient documentation

## 2013-11-14 DIAGNOSIS — C3491 Malignant neoplasm of unspecified part of right bronchus or lung: Secondary | ICD-10-CM

## 2013-11-14 DIAGNOSIS — C3490 Malignant neoplasm of unspecified part of unspecified bronchus or lung: Secondary | ICD-10-CM | POA: Insufficient documentation

## 2013-11-14 DIAGNOSIS — J438 Other emphysema: Secondary | ICD-10-CM | POA: Insufficient documentation

## 2013-11-14 DIAGNOSIS — C3410 Malignant neoplasm of upper lobe, unspecified bronchus or lung: Secondary | ICD-10-CM

## 2013-11-14 DIAGNOSIS — K8020 Calculus of gallbladder without cholecystitis without obstruction: Secondary | ICD-10-CM | POA: Insufficient documentation

## 2013-11-14 DIAGNOSIS — Z9221 Personal history of antineoplastic chemotherapy: Secondary | ICD-10-CM | POA: Insufficient documentation

## 2013-11-14 DIAGNOSIS — J984 Other disorders of lung: Secondary | ICD-10-CM | POA: Insufficient documentation

## 2013-11-14 DIAGNOSIS — I7090 Unspecified atherosclerosis: Secondary | ICD-10-CM | POA: Insufficient documentation

## 2013-11-14 DIAGNOSIS — K7689 Other specified diseases of liver: Secondary | ICD-10-CM | POA: Insufficient documentation

## 2013-11-14 LAB — CBC WITH DIFFERENTIAL/PLATELET
BASO%: 2.1 % — AB (ref 0.0–2.0)
Basophils Absolute: 0.1 10*3/uL (ref 0.0–0.1)
EOS%: 1.8 % (ref 0.0–7.0)
Eosinophils Absolute: 0.1 10*3/uL (ref 0.0–0.5)
HCT: 44.8 % (ref 34.8–46.6)
HGB: 15.2 g/dL (ref 11.6–15.9)
LYMPH#: 1.1 10*3/uL (ref 0.9–3.3)
LYMPH%: 20.4 % (ref 14.0–49.7)
MCH: 31.1 pg (ref 25.1–34.0)
MCHC: 33.9 g/dL (ref 31.5–36.0)
MCV: 91.7 fL (ref 79.5–101.0)
MONO#: 0.4 10*3/uL (ref 0.1–0.9)
MONO%: 7.8 % (ref 0.0–14.0)
NEUT#: 3.8 10*3/uL (ref 1.5–6.5)
NEUT%: 67.9 % (ref 38.4–76.8)
Platelets: 196 10*3/uL (ref 145–400)
RBC: 4.89 10*6/uL (ref 3.70–5.45)
RDW: 13.3 % (ref 11.2–14.5)
WBC: 5.6 10*3/uL (ref 3.9–10.3)

## 2013-11-14 LAB — COMPREHENSIVE METABOLIC PANEL (CC13)
ALT: 15 U/L (ref 0–55)
AST: 18 U/L (ref 5–34)
Albumin: 4.3 g/dL (ref 3.5–5.0)
Alkaline Phosphatase: 76 U/L (ref 40–150)
Anion Gap: 9 mEq/L (ref 3–11)
BUN: 9 mg/dL (ref 7.0–26.0)
CO2: 28 mEq/L (ref 22–29)
Calcium: 10.1 mg/dL (ref 8.4–10.4)
Chloride: 104 mEq/L (ref 98–109)
Creatinine: 0.9 mg/dL (ref 0.6–1.1)
Glucose: 93 mg/dl (ref 70–140)
Potassium: 4.3 mEq/L (ref 3.5–5.1)
Sodium: 140 mEq/L (ref 136–145)
Total Bilirubin: 0.57 mg/dL (ref 0.20–1.20)
Total Protein: 7 g/dL (ref 6.4–8.3)

## 2013-11-14 MED ORDER — IOHEXOL 300 MG/ML  SOLN
80.0000 mL | Freq: Once | INTRAMUSCULAR | Status: AC | PRN
  Administered 2013-11-14: 80 mL via INTRAVENOUS

## 2013-11-14 MED ORDER — ALBUTEROL SULFATE HFA 108 (90 BASE) MCG/ACT IN AERS: 2.0000 | INHALATION_SPRAY | Freq: Four times a day (QID) | RESPIRATORY_TRACT | Status: DC | PRN

## 2013-11-14 NOTE — Telephone Encounter (Signed)
LMTCB

## 2013-11-14 NOTE — Telephone Encounter (Signed)
Spoke with the pt  She states that the spiriva respimat was only worked well for the first half of her day  She states that toward the evening she found herself feeling more SOB  She did well with symbicort and unsure why this was changed in the first place  I have refilled her albuterol HFA  Please advise on symbicort thanks!

## 2013-11-15 ENCOUNTER — Ambulatory Visit: Payer: Medicare Other | Admitting: Internal Medicine

## 2013-11-16 NOTE — Telephone Encounter (Signed)
Pt advised MR is back on Monday and will address at that time. Talpa Bing, CMA

## 2013-11-16 NOTE — Telephone Encounter (Signed)
Pt states that she was able to p/u her albuterol & thought that the symbicort Rx was going to be called in as well, but it hasn't been called in yet.  Pt would like to know why it wasn't called in & would like to pick this up asap.  Pharmacy is Secretary/administrator.  Pt can be reached at [phone].  Satira Anis

## 2013-11-19 ENCOUNTER — Telehealth: Payer: Self-pay | Admitting: Internal Medicine

## 2013-11-19 ENCOUNTER — Encounter: Payer: Self-pay | Admitting: Internal Medicine

## 2013-11-19 ENCOUNTER — Ambulatory Visit (HOSPITAL_BASED_OUTPATIENT_CLINIC_OR_DEPARTMENT_OTHER): Payer: Medicare Other | Admitting: Internal Medicine

## 2013-11-19 VITALS — BP 124/81 | HR 70 | Temp 97.5°F | Resp 17 | Ht 62.0 in | Wt 142.9 lb

## 2013-11-19 DIAGNOSIS — C3490 Malignant neoplasm of unspecified part of unspecified bronchus or lung: Secondary | ICD-10-CM

## 2013-11-19 DIAGNOSIS — C3410 Malignant neoplasm of upper lobe, unspecified bronchus or lung: Secondary | ICD-10-CM

## 2013-11-19 NOTE — Telephone Encounter (Signed)
[facility] PT APPT SCHEDULE FOR OCT. [facility] WILL CALL W/CT APPT.

## 2013-11-19 NOTE — Progress Notes (Signed)
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   PRIOR THERAPY: Status post wedge resection of the right upper lobe lung nodule under the care of Dr. Roxan Hockey on 10/20/2011.   CURRENT THERAPY: Observation.  INTERVAL HISTORY: [name] 73 y.o. female returns to the clinic today for six-month followup visit. The patient is feeling fine today with no specific complaints. She denied having any significant chest pain, shortness of breath, cough or hemoptysis. She denied having any weight loss or night sweats. 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 The patient had repeat CT scan of the chest performed recently and she is here for evaluation and discussion of her scan results.   MEDICAL HISTORY: Past Medical History  Diagnosis Date  . COPD (chronic obstructive pulmonary disease)   . Depression   . Anxiety   . Gout     last time 6-70yrs ago   . Migraine   . OCD (obsessive compulsive disorder)   . Tinea corporis   . Acute bronchitis   . Allergic rhinitis   . Gout   . Macrocytosis   . Skin rash   . Hypertension     takes Amlodipine nightly  . Hyperlipidemia     taking Flax Seed Oil and Fish Oil  . Emphysema   . Hx of migraines 90's    after menopause HA stopped  . Arthritis     all over  . Osteopenia     takes Calcium and Vit D bid  . Back pain   . Liver cyst   . IBS (irritable bowel syndrome)   . Bilateral cataracts   . Hot flashes     takes Prozac daily  . Lung nodule     right  . Gallstones   . Arthritis   . Chicken pox   . Seasonal allergies   . Hypertension   . History of blood transfusion   . Pneumonia     x 2 ;last time back in 1999  . Lung  cancer   . Anemia   . 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 Smoking 09/12/2011    ALLERGIES:  is allergic to clindamycin/lincomycin; penicillins; spiriva; and sulfa antibiotics.  MEDICATIONS:  Current Outpatient Prescriptions  Medication Sig Dispense Refill  . albuterol (PROAIR HFA) 108 (90 BASE) MCG/ACT inhaler Inhale 2 puffs into the lungs every 6 (six) hours as needed for wheezing or shortness of breath.  1 Inhaler  1  . amLODipine (NORVASC) 5 MG tablet Take 1 tablet (5 mg total) by mouth at bedtime.  90 tablet  3  . Cholecalciferol (VITAMIN D3) 3000 UNITS TABS Take 1,200 Units by mouth daily.      . diphenhydrAMINE (BENADRYL) 25 MG tablet Take 25 mg by mouth every 6 (six) hours as needed for allergies.       Marland Kitchen FLUoxetine (PROZAC) 20 MG tablet Take 1 tablet (20 mg total) by mouth 2 (two) times daily.  180 tablet  3  . GLUCOSAMINE HCL PO Take 1,000 mg by mouth 2 (two) times daily.      . Multiple Vitamin (MULTIVITAMIN) tablet Take 1 tablet by  mouth daily.      Marland Kitchen triamcinolone cream (KENALOG) 0.1 % Apply 1 application topically 2 (two) times daily.  30 g  0  . vitamin E 400 UNIT capsule Take 400 Units by mouth daily.      . SYMBICORT 160-4.5 MCG/ACT inhaler       . [DISCONTINUED] Calcium Carbonate-Vitamin D (CALCIUM + D PO) Take by mouth daily. 500+400        No current facility-administered medications for this visit.    SURGICAL HISTORY:  Past Surgical History  Procedure Laterality Date  . Inguinal hernia repair  2000  . Neuroplasty decompression median nerve at carpal tunnel    . Rotator cuff repair  2001    right  . Tonsillectomy    . Tonsillectomy  as a child    and adenoids  . Dilation and curettage of uterus      at age 68  . Cyst removed  >85yrs ago    from left leg  . Carpal tunnel release  80's    right   . Total abdominal hysterectomy  2003  . Total hip arthroplasty  2011    left  . Colonoscopy    . Video bronchoscopy   10/20/2011    Procedure: VIDEO BRONCHOSCOPY;  Surgeon: Grace Isaac, MD;  Location: [facility];  Service: Thoracic;  Laterality: N/A;  . Wedge resection  10-20-2011    rt lung  - for lung cancer  . Total hip arthroplasty  03/07/2012    Procedure: TOTAL HIP ARTHROPLASTY ANTERIOR APPROACH;  Surgeon: [name], MD;  Location: [facility] ORS;  Service: Orthopedics;  Laterality: Right;  . Total knee arthroplasty Right 01/02/2013    Procedure: RIGHT TOTAL KNEE ARTHROPLASTY;  Surgeon: [name], MD;  Location: [facility] ORS;  Service: Orthopedics;  Laterality: Right;  . Total knee arthroplasty Left 04/03/2013    Procedure: LEFT TOTAL KNEE ARTHROPLASTY;  Surgeon: [name], MD;  Location: [facility] ORS;  Service: Orthopedics;  Laterality: Left;    REVIEW OF SYSTEMS:  A comprehensive review of systems was negative.   PHYSICAL EXAMINATION: General appearance: alert, cooperative and no distress Head: Normocephalic, without obvious abnormality, atraumatic Neck: no adenopathy, no JVD, supple, symmetrical, trachea midline and thyroid not enlarged, symmetric, no tenderness/mass/nodules Lymph nodes: Cervical, supraclavicular, and axillary nodes normal. Resp: clear to auscultation bilaterally Cardio: regular rate and rhythm, S1, S2 normal, no murmur, click, rub or gallop GI: soft, non-tender; bowel sounds normal; no masses,  no organomegaly Extremities: extremities normal, atraumatic, no cyanosis or edema  ECOG PERFORMANCE STATUS: 1 - Symptomatic but completely ambulatory  Blood pressure 124/81, pulse 70, temperature 97.5 F (36.4 C), temperature source Oral, resp. rate 17, height 5\' 2"  (1.575 m), weight 142 lb 14.4 oz (64.819 kg), SpO2 97.00%.  LABORATORY DATA: Lab Results  Component Value Date   WBC 5.6 11/14/2013   HGB 15.2 11/14/2013   HCT 44.8 11/14/2013   MCV 91.7 11/14/2013   PLT 196 11/14/2013      Chemistry      Component Value Date/Time   NA 140 11/14/2013 0920   NA 131* 04/05/2013 0453   K 4.3 11/14/2013  0920   K 3.9 04/05/2013 0453   CL 98 04/05/2013 0453   CL 103 11/13/2012 0939   CO2 28 11/14/2013 0920   CO2 28 04/05/2013 0453   BUN 9.0 11/14/2013 0920   BUN 5* 04/05/2013 0453   CREATININE 0.9 11/14/2013 0920   CREATININE 0.67 04/05/2013 0453  Component Value Date/Time   CALCIUM 10.1 11/14/2013 0920   CALCIUM 8.4 04/05/2013 0453   ALKPHOS 76 11/14/2013 0920   ALKPHOS 44 10/22/2011 0335   AST 18 11/14/2013 0920   AST 20 10/22/2011 0335   ALT 15 11/14/2013 0920   ALT 14 10/22/2011 0335   BILITOT 0.57 11/14/2013 0920   BILITOT 0.5 10/22/2011 0335       RADIOGRAPHIC STUDIES: Ct Chest W Contrast  11/14/2013   CLINICAL DATA:  History of lung cancer diagnosed in 2013 with subsequent surgery. Restaging. No reported chemotherapy.  EXAM: CT CHEST WITH CONTRAST  TECHNIQUE: Multidetector CT imaging of the chest was performed during intravenous contrast administration.  CONTRAST:  81mL OMNIPAQUE IOHEXOL 300 MG/ML  SOLN  COMPARISON:  Chest CTs dated 05/15/2013 and 11/13/2012.  FINDINGS: There are no enlarged mediastinal, hilar or axillary lymph nodes. The thoracic inlet, thyroid gland and tracheobronchial tree appear normal.  There is stable diffuse atherosclerosis of the aorta, great vessels and coronary arteries. The heart size is normal. There is no significant pleural or pericardial effusion.  The lungs demonstrate moderate emphysema with stable right upper lobe scarring. There are no suspicious pulmonary nodules.  Images through the upper abdomen again demonstrate multiple hepatic cysts. 1 in the left hepatic lobe demonstrates peripheral calcifications which are stable. There is a stable calcified gallstone. Mild fullness of both adrenal glands appears stable without focal mass.  Right thoracotomy defect and thoracic spine degenerative changes are noted. There are no worrisome osseous findings.  IMPRESSION: 1. Stable postoperative chest CT with right upper lobe scarring. 2. No evidence of local recurrence or metastatic  disease. 3. Moderate emphysema. 4. Grossly stable atherosclerosis, hepatic cysts and cholelithiasis.   Electronically Signed   By: Camie Patience M.D.   On: 11/14/2013 11:52   ASSESSMENT AND PLAN: This is a very pleasant 73 years old white female with history of stage IA non-small cell lung cancer, squamous cell carcinoma diagnosed in March of 2013 status post wedge resection of the right upper lobe and currently on observation. The patient is doing fine and she has no evidence for disease recurrence. I discussed the scan results with her and recommended for her to continue on observation with repeat CT scan of the chest in 6 months. She was advised to call immediately if she has any concerning symptoms in the interval.  The patient voices understanding of current disease status and treatment options and is in agreement with the current care plan.  All questions were answered. The patient knows to call the clinic with any problems, questions or concerns. We can certainly see the patient much sooner if necessary.  Disclaimer: This note was dictated with voice recognition software. Similar sounding words can inadvertently be transcribed and may not be corrected upon review.

## 2013-11-19 NOTE — Telephone Encounter (Signed)
LEt her know thatg   A) back in sept 2014: she is the one who did not want symbicort due to long term side effect of ICS. So, we sampled [facility] which she did not like. So, we sampled spiriva respimat which she has not liked either. I am happy to go back to symbicort.   Please call in 80/4.5 , 2 puff twice daily of symbicort  Ensure fu per prior OV  Dr. Brand Males, M.D., [facility].C.P Pulmonary and Critical Care Medicine Staff Physician [facility] Pager: [phone], If no answer or between  15:00h - 7:00h: call [phone]  11/19/2013 8:51 AM

## 2013-11-20 MED ORDER — BUDESONIDE-FORMOTEROL FUMARATE 80-4.5 MCG/ACT IN AERO: 2.0000 | INHALATION_SPRAY | Freq: Two times a day (BID) | RESPIRATORY_TRACT | Status: DC

## 2013-11-20 NOTE — Telephone Encounter (Signed)
Left detailed message for pt and advised her per MR sent in symbicort 80/4.5 to pharm and she will begin this 2 puffs bid. Pt is to call back with any questions or concerns.  This rx has been sent to [facility] on [address]. Nothing further is needed

## 2013-12-04 ENCOUNTER — Encounter: Payer: Self-pay | Admitting: Family Medicine

## 2013-12-04 ENCOUNTER — Ambulatory Visit (INDEPENDENT_AMBULATORY_CARE_PROVIDER_SITE_OTHER): Payer: Medicare Other | Admitting: Family Medicine

## 2013-12-04 VITALS — BP 126/80 | HR 69 | Temp 98.8°F | Ht 62.0 in | Wt 143.0 lb

## 2013-12-04 DIAGNOSIS — I10 Essential (primary) hypertension: Secondary | ICD-10-CM

## 2013-12-04 DIAGNOSIS — H65199 Other acute nonsuppurative otitis media, unspecified ear: Secondary | ICD-10-CM

## 2013-12-04 DIAGNOSIS — J309 Allergic rhinitis, unspecified: Secondary | ICD-10-CM

## 2013-12-04 DIAGNOSIS — H65191 Other acute nonsuppurative otitis media, right ear: Secondary | ICD-10-CM

## 2013-12-04 DIAGNOSIS — H8110 Benign paroxysmal vertigo, unspecified ear: Secondary | ICD-10-CM

## 2013-12-04 DIAGNOSIS — J329 Chronic sinusitis, unspecified: Secondary | ICD-10-CM

## 2013-12-04 MED ORDER — DOXYCYCLINE HYCLATE 100 MG PO TABS: 100.0000 mg | ORAL_TABLET | Freq: Two times a day (BID) | ORAL | Status: DC

## 2013-12-04 MED ORDER — MECLIZINE HCL 32 MG PO TABS: 32.0000 mg | ORAL_TABLET | Freq: Three times a day (TID) | ORAL | Status: DC | PRN

## 2013-12-04 NOTE — Progress Notes (Signed)
Pre visit review using our clinic review tool, if applicable. No additional management support is needed unless otherwise documented below in the visit note. 

## 2013-12-04 NOTE — Patient Instructions (Addendum)
AFRIN for 4 days then STOP  Nasocort daily for 21 days  Continue claritin  No driving until better  -As we discussed, we have prescribed a new medication (doxycycline for sinus infection and meclizine for vertigo) for you at this appointment. We discussed the common and serious potential adverse effects of this medication and you can review these and more with the pharmacist when you pick up your medication.  Please follow the instructions for use carefully and notify us immediately if you have any problems taking this medication.  -please call if you want to see the vestibular rehab therapist for the dizziness  Benign Positional Vertigo Vertigo means you feel like you or your surroundings are moving when they are not. Benign positional vertigo is the most common form of vertigo. Benign means that the cause of your condition is not serious. Benign positional vertigo is more common in older adults. CAUSES  Benign positional vertigo is the result of an upset in the labyrinth system. This is an area in the middle ear that helps control your balance. This may be caused by a viral infection, head injury, or repetitive motion. However, often no specific cause is found. SYMPTOMS  Symptoms of benign positional vertigo occur when you move your head or eyes in different directions. Some of the symptoms may include:  Loss of balance and falls.  Vomiting.  Blurred vision.  Dizziness.  Nausea.  Involuntary eye movements (nystagmus). DIAGNOSIS  Benign positional vertigo is usually diagnosed by physical exam. If the specific cause of your benign positional vertigo is unknown, your caregiver may perform imaging tests, such as magnetic resonance imaging (MRI) or computed tomography (CT). TREATMENT  Your caregiver may recommend movements or procedures to correct the benign positional vertigo. Medicines such as meclizine, benzodiazepines, and medicines for nausea may be used to treat your symptoms. In  rare cases, if your symptoms are caused by certain conditions that affect the inner ear, you may need surgery. HOME CARE INSTRUCTIONS   Follow your caregiver's instructions.  Move slowly. Do not make sudden body or head movements.  Avoid driving.  Avoid operating heavy machinery.  Avoid performing any tasks that would be dangerous to you or others during a vertigo episode.  Drink enough fluids to keep your urine clear or pale yellow. SEEK IMMEDIATE MEDICAL CARE IF:   You develop problems with walking, weakness, numbness, or using your arms, hands, or legs.  You have difficulty speaking.  You develop severe headaches.  Your nausea or vomiting continues or gets worse.  You develop visual changes.  Your family or friends notice any behavioral changes.  Your condition gets worse.  You have a fever.  You develop a stiff neck or sensitivity to light. MAKE SURE YOU:   Understand these instructions.  Will watch your condition.  Will get help right away if you are not doing well or get worse. Document Released: 05/03/2006 Document Revised: 10/18/2011 Document Reviewed: 04/15/2011 [facility] Patient Information 2014 [place].

## 2013-12-04 NOTE — Progress Notes (Signed)
No chief complaint on file.   HPI:  Acute visit for:  Dizziness: -has had episodes in the past with allergies -this episode started 4 days ago -room spins, intermittently, mild nausea, certain sudden movements cause it - more to the left -also having nasal congestion, sneezing, sinus pain and pressure, cough, drainage, ears full -changes in medications: started claritin a few weeks ago, started nasocort yesterday   ROS: See pertinent positives and negatives per HPI.  Past Medical History  Diagnosis Date  . COPD (chronic obstructive pulmonary disease)   . Depression   . Anxiety   . Gout     last time 6-60yrs ago   . Migraine   . OCD (obsessive compulsive disorder)   . Tinea corporis   . Acute bronchitis   . Allergic rhinitis   . Gout   . Macrocytosis   . Skin rash   . Hypertension     takes Amlodipine nightly  . Hyperlipidemia     taking Flax Seed Oil and Fish Oil  . Emphysema   . Hx of migraines 90's    after menopause HA stopped  . Arthritis     all over  . Osteopenia     takes Calcium and Vit D bid  . Back pain   . Liver cyst   . IBS (irritable bowel syndrome)   . Bilateral cataracts   . Hot flashes     takes Prozac daily  . Lung nodule     right  . Gallstones   . Arthritis   . Chicken pox   . Seasonal allergies   . Hypertension   . History of blood transfusion   . Pneumonia     x 2 ;last time back in 1999  . Lung cancer   . Anemia   . 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 Smoking 09/12/2011    Past Surgical History  Procedure Laterality Date  . Inguinal hernia repair  2000  . Neuroplasty decompression median nerve at carpal tunnel    . Rotator cuff repair  2001    right  . Tonsillectomy    . Tonsillectomy  as a child    and adenoids  . Dilation and curettage of uterus      at age 50  . Cyst removed  >61yrs ago    from left leg  . Carpal tunnel release  80's    right   . Total abdominal hysterectomy   2003  . Total hip arthroplasty  2011    left  . Colonoscopy    . Video bronchoscopy  10/20/2011    Procedure: VIDEO BRONCHOSCOPY;  Surgeon: Grace Isaac, MD;  Location[facility] OR;  Service: Thoracic;  Laterality: N/A;  . Wedge resection  10-20-2011    rt lung  - for lung cancer  . Total hip arthroplasty  03/07/2012    Procedure: TOTAL HIP ARTHROPLASTY ANTERIOR APPROACH;  Surgeon: [name], MD;  Location: [facility];  Service: Orthopedics;  Laterality: Right;  . Total knee arthroplasty Right 01/02/2013    Procedure: RIGHT TOTAL KNEE ARTHROPLASTY;  Surgeon: [name], MD;  Location: [facility];  Service: Orthopedics;  Laterality: Right;  . Total knee arthroplasty Left 04/03/2013    Procedure: LEFT TOTAL KNEE ARTHROPLASTY;  Surgeon: [name], MD;  Location: [facility];  Service: Orthopedics;  Laterality: Left;    Family History  Problem Relation Age of Onset  .  Alcohol abuse    . Depression    . Hearing loss    . Heart disease    . Hypertension    . Anesthesia problems Neg Hx   . Hypotension Neg Hx   . Malignant hyperthermia Neg Hx   . Pseudochol deficiency Neg Hx   . Hypertension Brother     History   Social History  . Marital Status: Married    Spouse Name: N/A    Number of Children: N/A  . Years of Education: N/A   Occupational History  . retired    Social History Main Topics  . Smoking status: Former Smoker -- 0.50 packs/day for 50 years    Types: Cigarettes    Quit date: 09/23/2011  . Smokeless tobacco: Never Used  . Alcohol Use: 0.0 oz/week     Comment: 2 beers every night  . Drug Use: No  . Sexual Activity: No   Other Topics Concern  . None   Social History Narrative  . None    Current outpatient prescriptions:albuterol (PROAIR HFA) 108 (90 BASE) MCG/ACT inhaler, Inhale 2 puffs into the lungs every 6 (six) hours as needed for wheezing or shortness of breath., Disp: 1 Inhaler, Rfl: 1;  amLODipine (NORVASC) 5 MG tablet, Take 1 tablet (5 mg total) by mouth  at bedtime., Disp: 90 tablet, Rfl: 3 budesonide-formoterol (SYMBICORT) 80-4.5 MCG/ACT inhaler, Inhale 2 puffs into the lungs 2 (two) times daily., Disp: 1 Inhaler, Rfl: 12;  Cholecalciferol (VITAMIN D3) 3000 UNITS TABS, Take 1,200 Units by mouth daily., Disp: , Rfl: ;  DimenhyDRINATE (DRAMAMINE PO), Take by mouth., Disp: , Rfl: ;  diphenhydrAMINE (BENADRYL) 25 MG tablet, Take 25 mg by mouth every 6 (six) hours as needed for allergies. , Disp: , Rfl:  FLUoxetine (PROZAC) 20 MG tablet, Take 1 tablet (20 mg total) by mouth 2 (two) times daily., Disp: 180 tablet, Rfl: 3;  GLUCOSAMINE HCL PO, Take 1,000 mg by mouth 2 (two) times daily., Disp: , Rfl: ;  Loratadine (CLARITIN PO), Take by mouth., Disp: , Rfl: ;  Multiple Vitamin (MULTIVITAMIN) tablet, Take 1 tablet by mouth daily., Disp: , Rfl: ;  SYMBICORT 160-4.5 MCG/ACT inhaler, , Disp: , Rfl:  triamcinolone cream (KENALOG) 0.1 %, Apply 1 application topically 2 (two) times daily., Disp: 30 g, Rfl: 0;  vitamin E 400 UNIT capsule, Take 400 Units by mouth daily., Disp: , Rfl: ;  doxycycline (VIBRA-TABS) 100 MG tablet, Take 1 tablet (100 mg total) by mouth 2 (two) times daily., Disp: 20 tablet, Rfl: 0;  meclizine (ANTIVERT) 32 MG tablet, Take 1 tablet (32 mg total) by mouth 3 (three) times daily as needed., Disp: 30 tablet, Rfl: 0 [DISCONTINUED] Calcium Carbonate-Vitamin D (CALCIUM + D PO), Take by mouth daily. 500+400 , Disp: , Rfl:   EXAM:  Filed Vitals:   12/04/13 1301  BP: 126/80  Pulse: 69  Temp: 98.8 F (37.1 C)    Body mass index is 26.15 kg/(m^2).  GENERAL: vitals reviewed and listed above, alert, oriented, appears well hydrated and in no acute distress  HEENT: atraumatic, conjunttiva clear, no obvious abnormalities on inspection of external nose and ears, normal appearance of ear canals and TMs except for R clear effusion, clear nasal congestion, mild post oropharyngeal erythema with PND, no tonsillar edema or exudate, no sinus TTP  NECK: no  obvious masses on inspection  LUNGS: clear to auscultation bilaterally, no wheezes, rales or rhonchi, good air movement  CV: HRRR, no peripheral edema  MS:  moves all extremities without noticeable abnormality  PSYCH: pleasant and cooperative, no obvious depression or anxiety  NEURO: PERRLA, CNII-XII grosslly intact, gait normal, dix hallpike to L reproduces symptoms with mild rto nystagmus  ASSESSMENT AND PLAN:  Discussed the following assessment and plan:  Allergic rhinitis  Sinusitis - Plan: doxycycline (VIBRA-TABS) 100 MG tablet  Benign paroxysmal positional vertigo - Plan: meclizine (ANTIVERT) 32 MG tablet  Hypertension  Acute effusion of right ear  -we discussed possible serious and likely etiologies, workup and treatment, treatment risks and return precautions -after this discussion, [name] opted for symptomatic tx of vertigo and to call if decides to do vestibular rehab for likely positional vertigo per hx and exam vs allergy/sinus related - tx per instructions and orders -of course, we advised Mekayla  to return or notify a doctor immediately if symptoms worsen or persist or new concerns arise.  -Patient advised to return or notify a doctor immediately if symptoms worsen or persist or new concerns arise.  Patient Instructions  AFRIN for 4 days then STOP  Nasocort daily for 21 days  Continue claritin  -As we discussed, we have prescribed a new medication (amoxicillin for sinus infection and meclizine for vertigo) for you at this appointment. We discussed the common and serious potential adverse effects of this medication and you can review these and more with the pharmacist when you pick up your medication.  Please follow the instructions for use carefully and notify us immediately if you have any problems taking this medication.  -please call if you want to see the vestibular rehab therapist for the dizziness  Benign Positional Vertigo Vertigo means you feel like  you or your surroundings are moving when they are not. Benign positional vertigo is the most common form of vertigo. Benign means that the cause of your condition is not serious. Benign positional vertigo is more common in older adults. CAUSES  Benign positional vertigo is the result of an upset in the labyrinth system. This is an area in the middle ear that helps control your balance. This may be caused by a viral infection, head injury, or repetitive motion. However, often no specific cause is found. SYMPTOMS  Symptoms of benign positional vertigo occur when you move your head or eyes in different directions. Some of the symptoms may include:  Loss of balance and falls.  Vomiting.  Blurred vision.  Dizziness.  Nausea.  Involuntary eye movements (nystagmus). DIAGNOSIS  Benign positional vertigo is usually diagnosed by physical exam. If the specific cause of your benign positional vertigo is unknown, your caregiver may perform imaging tests, such as magnetic resonance imaging (MRI) or computed tomography (CT). TREATMENT  Your caregiver may recommend movements or procedures to correct the benign positional vertigo. Medicines such as meclizine, benzodiazepines, and medicines for nausea may be used to treat your symptoms. In rare cases, if your symptoms are caused by certain conditions that affect the inner ear, you may need surgery. HOME CARE INSTRUCTIONS   Follow your caregiver's instructions.  Move slowly. Do not make sudden body or head movements.  Avoid driving.  Avoid operating heavy machinery.  Avoid performing any tasks that would be dangerous to you or others during a vertigo episode.  Drink enough fluids to keep your urine clear or pale yellow. SEEK IMMEDIATE MEDICAL CARE IF:   You develop problems with walking, weakness, numbness, or using your arms, hands, or legs.  You have difficulty speaking.  You develop severe headaches.  Your nausea or vomiting continues  or  gets worse.  You develop visual changes.  Your family or friends notice any behavioral changes.  Your condition gets worse.  You have a fever.  You develop a stiff neck or sensitivity to light. MAKE SURE YOU:   Understand these instructions.  Will watch your condition.  Will get help right away if you are not doing well or get worse. Document Released: 05/03/2006 Document Revised: 10/18/2011 Document Reviewed: 04/15/2011 [facility] Patient Information 2014 [place].       [name]

## 2013-12-06 ENCOUNTER — Other Ambulatory Visit: Payer: Self-pay | Admitting: *Deleted

## 2013-12-06 ENCOUNTER — Telehealth: Payer: Self-pay | Admitting: Family Medicine

## 2013-12-06 MED ORDER — MECLIZINE HCL 25 MG PO TABS: 25.0000 mg | ORAL_TABLET | Freq: Three times a day (TID) | ORAL | Status: DC | PRN

## 2013-12-06 NOTE — Telephone Encounter (Signed)
Pt stated [facility] on [address] does not carry meclizine 32 mg. Med does not come in 32 mg. Please advise and call pharm

## 2014-04-02 ENCOUNTER — Ambulatory Visit: Payer: Medicare Other | Admitting: Internal Medicine

## 2014-04-05 ENCOUNTER — Ambulatory Visit (INDEPENDENT_AMBULATORY_CARE_PROVIDER_SITE_OTHER): Payer: Medicare Other | Admitting: Internal Medicine

## 2014-04-05 ENCOUNTER — Encounter: Payer: Self-pay | Admitting: Internal Medicine

## 2014-04-05 VITALS — BP 116/70 | HR 72 | Ht 62.0 in | Wt 143.6 lb

## 2014-04-05 DIAGNOSIS — J4489 Other specified chronic obstructive pulmonary disease: Secondary | ICD-10-CM

## 2014-04-05 DIAGNOSIS — Z23 Encounter for immunization: Secondary | ICD-10-CM

## 2014-04-05 DIAGNOSIS — J449 Chronic obstructive pulmonary disease, unspecified: Secondary | ICD-10-CM

## 2014-04-05 NOTE — Patient Instructions (Addendum)
#  chronic rhinosinusitis - we discussed and agreed that this is a difficult problem to treat and you would rather watch it than subject yourself to more testing and referrals - continue nasal steroids; encourage you to use it as much as possible  - encourage daily netti pott as well  #COPD  Gold stge 2  - stable.  - continue symbicort - ensure flu shot  When available in community - PREVNAR vaccine 04/05/2014 if eligible - recommend o2 with exertion > 300 feet; respect your desire to refuse - please call me in 3 weeks to see wha tI can find about you going to [place],000 feet high but as of now I discouragte you from mountain trip due to exertional hypoxemia even in [place]  #lung cancer surveillance  - through Dr Julien Nordmann; last CT April 2015 without cancer recurrence  #followup  - 9 months with CAT score

## 2014-04-05 NOTE — Progress Notes (Signed)
Subjective:    Patient ID: [name], female    DOB: 10-18-1940, 73 y.o.   MRN: [id number]  HPI   PMD is Dr Arsenio Loader.   #Known spring allergies,   #IBS, OA - knees and hip and does regular water exercises.   #Drinks 2 coors lite each night   -CAGE -negative early 2013.    #Ex-Smoker  - 25 pack-year smoking history. Quit 2013 after diagnosis of lung cancer.  #COPD - 2013: Pre RUL lobectomy and Pre Rx: fev1 1.1L/60% , DLCO 9.6/56%, TLC 5.6/128%  - 2013 May: Post lobectomy and post Rx: fev1: 1.28L/60%, ratio 49 . No desat walk test - Rx spiriva, symbicort since spring 2013 - 2014 Nov: 06/22/2013. FEV1 is 1.3 L/62%. No post bronchodilator change. FVC is 2.6 L postop day later which is 95%. Ratio is 50. Total lung capacity is 4.96L/104%. DLCO is level 0.7/54%. - On synmbicort alone 04/05/2014   # STage 1 lung cancer: pT1b, pN0 Stage 08 October 2011  - s/p lobectomy by Dr Servando Snare  - surveillance by Dr Earlie Server  - 05/15/13 - Clear CT. No recurrence  - April 2015 - clear CT,No recurrence       OV 04/05/2014   Chief Complaint  Patient presents with  . Follow-up    Pt states she only gets SOB with over exertion. Pt c/o cough secondary to PND. Pt denies CP/tightness. Pt last seen /    FU   - chronic rhinosinusitis: still a problem. Moderate severity. She feels it will never go away. She does not want tests or referrals. Netti pot and nasal steroids help but she is not motivated to be compliant  - lung cancer: April 2015 surveillance with onc - under remission. Next ct in 3 weeks  - COPD: stable. CAT Score below is 9. No new issues. Went to [place] 2 weeks ago to mountains (used to live there) got dyspneic but now well after return. She did not tolerate anoro; doing symbicort only. Did not like spiriva either. Will have flu shot when avialble. Alpha 1 in  Nov 2014 is MM and normal. She now wants to travel to [place],000 feet altitude; this is the [place]  in [place]. Walk test today 185 feet x 3 laps on RA: Desaturrated to < 88% at 2 laps. She has questions (basic) about copd which kind of surprised me   CAT COPD Symptom & Quality of Life Score ([employer] trademark) 0 is no burden. 5 is highest burden 07/17/2012  06/22/2013 Post knee replacement 04/05/2014   Never Cough -> Cough all the time 2 2 2   No phlegm in chest -> Chest is full of phlegm 0 2 0  No chest tightness -> Chest feels very tight 0 1 0  No dyspnea for 1 flight stairs/hill -> Very dyspneic for 1 flight of stairs 2 1 1   No limitations for ADL at home -> Very limited with ADL at home 0 0 1  Confident leaving home -> Not at all confident leaving home 0 0 1  Sleep soundly -> Do not sleep soundly because of lung condition 0 2 2  Lots of Energy -> No energy at all 2 1 2   TOTAL Score (max 40)  6 9 9       Review of Systems  Constitutional: Negative for fever and unexpected weight change.  HENT: Positive for postnasal drip. Negative for congestion, dental problem, ear pain, nosebleeds, rhinorrhea, sinus pressure, sneezing,  sore throat and trouble swallowing.   Eyes: Negative for redness and itching.  Respiratory: Positive for cough and shortness of breath. Negative for chest tightness and wheezing.   Cardiovascular: Negative for palpitations and leg swelling.  Gastrointestinal: Negative for nausea and vomiting.  Genitourinary: Negative for dysuria.  Musculoskeletal: Negative for joint swelling.  Skin: Negative for rash.  Neurological: Negative for headaches.  Hematological: Does not bruise/bleed easily.  Psychiatric/Behavioral: Negative for dysphoric mood. The patient is not nervous/anxious.    Current outpatient prescriptions:albuterol (PROAIR HFA) 108 (90 BASE) MCG/ACT inhaler, Inhale 2 puffs into the lungs every 6 (six) hours as needed for wheezing or shortness of breath., Disp: 1 Inhaler, Rfl: 1;  amLODipine (NORVASC) 5 MG tablet, Take 1 tablet (5 mg total) by mouth at bedtime.,  Disp: 90 tablet, Rfl: 3 budesonide-formoterol (SYMBICORT) 80-4.5 MCG/ACT inhaler, Inhale 2 puffs into the lungs 2 (two) times daily., Disp: 1 Inhaler, Rfl: 12;  Cholecalciferol (VITAMIN D3) 3000 UNITS TABS, Take 1,200 Units by mouth daily., Disp: , Rfl: ;  FLUoxetine (PROZAC) 20 MG tablet, Take 1 tablet (20 mg total) by mouth 2 (two) times daily., Disp: 180 tablet, Rfl: 3;  Loratadine (CLARITIN PO), Take by mouth., Disp: , Rfl:  Multiple Vitamin (MULTIVITAMIN) tablet, Take 1 tablet by mouth daily., Disp: , Rfl: ;  OVER THE COUNTER MEDICATION, Tumeric qd, Disp: , Rfl: ;  vitamin E 400 UNIT capsule, Take 400 Units by mouth daily., Disp: , Rfl: ;  [DISCONTINUED] Calcium Carbonate-Vitamin D (CALCIUM + D PO), Take by mouth daily. 500+400 , Disp: , Rfl:      Objective:   Physical Exam  Vitals reviewed. Constitutional: She is oriented to person, place, and time. She appears well-developed and well-nourished. No distress.  HENT:  Head: Normocephalic and atraumatic.  Right Ear: External ear normal.  Left Ear: External ear normal.  Mouth/Throat: Oropharynx is clear and moist. No oropharyngeal exudate.  Eyes: Conjunctivae and EOM are normal. Pupils are equal, round, and reactive to light. Right eye exhibits no discharge. Left eye exhibits no discharge. No scleral icterus.  Neck: Normal range of motion. Neck supple. No JVD present. No tracheal deviation present. No thyromegaly present.  Cardiovascular: Normal rate, regular rhythm, normal heart sounds and intact distal pulses.  Exam reveals no gallop and no friction rub.   No murmur heard. Pulmonary/Chest: Effort normal and breath sounds normal. No respiratory distress. She has no wheezes. She has no rales. She exhibits no tenderness.  Abdominal: Soft. Bowel sounds are normal. She exhibits no distension and no mass. There is no tenderness. There is no rebound and no guarding.  Musculoskeletal: Normal range of motion. She exhibits no edema and no tenderness.    Lymphadenopathy:    She has no cervical adenopathy.  Neurological: She is alert and oriented to person, place, and time. She has normal reflexes. No cranial nerve deficit. She exhibits normal muscle tone. Coordination normal.  Skin: Skin is warm and dry. No rash noted. She is not diaphoretic. No erythema. No pallor.  Psychiatric: She has a normal mood and affect. Her behavior is normal. Judgment and thought content normal.     Filed Vitals:   04/05/14 1344  BP: 116/70  Pulse: 72  Height: 5\' 2"  (1.575 m)  Weight: 143 lb 9.6 oz (65.137 kg)  SpO2: 97%        Assessment & Plan:  chronic rhinosinusitis - we discussed and agreed that this is a difficult problem to treat and you would rather watch  it than subject yourself to more testing and referrals - continue nasal steroids; encourage you to use it as much as possible  - encourage daily netti pott as well  #COPD  Gold stge 2  - stable.  - continue symbicort - ensure flu shot  When available in community - PREVNAR vaccine 04/05/2014 if eligible - recommend o2 with exertion > 300 feet; respect your desire to refuse - please call me in 3 weeks to see wha tI can find about you going to [place] high but as of now I strongly discouragte you from mountain trip due to exertional hypoxemia [place]  #lung cancer surveillance  - through Dr Julien Nordmann; last CT April 2015 without cancer recurrence  #followup  - 9 months with CAT score   > 50% of this > 25 min visit spent in face to face counseling (15 min visit converted to 25 min)

## 2014-04-06 ENCOUNTER — Telehealth: Payer: Self-pay | Admitting: Internal Medicine

## 2014-04-06 NOTE — Telephone Encounter (Signed)
[name]  Please tell [name] that I spoke to 3 pulmonary docs from different states and they are all uniform that she should not go to mountains past 3000 feet. They all said she should look for [place] spots [place] to visit, humor  Dr. Brand Males, M.D., [facility].C.P Pulmonary and Critical Care Medicine Staff Physician [facility] Pager: [phone], If no answer or between  15:00h - 7:00h: call [phone]  04/06/2014 5:33 PM

## 2014-04-08 NOTE — Telephone Encounter (Signed)
Called and spoke to pt. Informed pt of recs per MR. Pt verbalized understanding and denied any further questions or concerns at this time.

## 2014-04-17 ENCOUNTER — Ambulatory Visit (INDEPENDENT_AMBULATORY_CARE_PROVIDER_SITE_OTHER): Payer: Medicare Other

## 2014-04-17 DIAGNOSIS — Z23 Encounter for immunization: Secondary | ICD-10-CM

## 2014-04-18 ENCOUNTER — Other Ambulatory Visit: Payer: Self-pay | Admitting: Family Medicine

## 2014-04-29 ENCOUNTER — Telehealth: Payer: Self-pay | Admitting: Internal Medicine

## 2014-04-29 NOTE — Telephone Encounter (Signed)
returned pt call and r/s appt per pt request....pt ok adn aware of new d.t

## 2014-05-20 ENCOUNTER — Encounter (HOSPITAL_COMMUNITY): Payer: Self-pay

## 2014-05-20 ENCOUNTER — Other Ambulatory Visit (HOSPITAL_BASED_OUTPATIENT_CLINIC_OR_DEPARTMENT_OTHER): Payer: Medicare Other

## 2014-05-20 ENCOUNTER — Ambulatory Visit (HOSPITAL_COMMUNITY)
Admission: RE | Admit: 2014-05-20 | Discharge: 2014-05-20 | Disposition: A | Discharge status: Home / Self Care | Payer: Medicare Other | Source: Ambulatory Visit | Attending: Internal Medicine | Admitting: Internal Medicine

## 2014-05-20 DIAGNOSIS — C3490 Malignant neoplasm of unspecified part of unspecified bronchus or lung: Secondary | ICD-10-CM

## 2014-05-20 DIAGNOSIS — K8020 Calculus of gallbladder without cholecystitis without obstruction: Secondary | ICD-10-CM | POA: Diagnosis not present

## 2014-05-20 DIAGNOSIS — C3491 Malignant neoplasm of unspecified part of right bronchus or lung: Secondary | ICD-10-CM | POA: Insufficient documentation

## 2014-05-20 DIAGNOSIS — C3411 Malignant neoplasm of upper lobe, right bronchus or lung: Secondary | ICD-10-CM

## 2014-05-20 DIAGNOSIS — J439 Emphysema, unspecified: Secondary | ICD-10-CM | POA: Insufficient documentation

## 2014-05-20 LAB — CBC WITH DIFFERENTIAL/PLATELET
BASO%: 1.4 % (ref 0.0–2.0)
Basophils Absolute: 0.1 10*3/uL (ref 0.0–0.1)
EOS%: 1.9 % (ref 0.0–7.0)
Eosinophils Absolute: 0.1 10*3/uL (ref 0.0–0.5)
HCT: 44.6 % (ref 34.8–46.6)
HEMOGLOBIN: 14.8 g/dL (ref 11.6–15.9)
LYMPH%: 18.2 % (ref 14.0–49.7)
MCH: 32.3 pg (ref 25.1–34.0)
MCHC: 33.2 g/dL (ref 31.5–36.0)
MCV: 97.1 fL (ref 79.5–101.0)
MONO#: 0.5 10*3/uL (ref 0.1–0.9)
MONO%: 7.9 % (ref 0.0–14.0)
NEUT#: 4.4 10*3/uL (ref 1.5–6.5)
NEUT%: 70.6 % (ref 38.4–76.8)
Platelets: 193 10*3/uL (ref 145–400)
RBC: 4.59 10*6/uL (ref 3.70–5.45)
RDW: 12.5 % (ref 11.2–14.5)
WBC: 6.3 10*3/uL (ref 3.9–10.3)
lymph#: 1.1 10*3/uL (ref 0.9–3.3)

## 2014-05-20 LAB — COMPREHENSIVE METABOLIC PANEL (CC13)
ALT: 18 U/L (ref 0–55)
AST: 19 U/L (ref 5–34)
Albumin: 4.1 g/dL (ref 3.5–5.0)
Alkaline Phosphatase: 69 U/L (ref 40–150)
Anion Gap: 9 mEq/L (ref 3–11)
BUN: 8.6 mg/dL (ref 7.0–26.0)
CALCIUM: 9.6 mg/dL (ref 8.4–10.4)
CHLORIDE: 103 meq/L (ref 98–109)
CO2: 26 mEq/L (ref 22–29)
CREATININE: 0.8 mg/dL (ref 0.6–1.1)
GLUCOSE: 91 mg/dL (ref 70–140)
Potassium: 4.1 mEq/L (ref 3.5–5.1)
Sodium: 138 mEq/L (ref 136–145)
Total Bilirubin: 0.57 mg/dL (ref 0.20–1.20)
Total Protein: 7 g/dL (ref 6.4–8.3)

## 2014-05-20 MED ORDER — IOHEXOL 300 MG/ML  SOLN
100.0000 mL | Freq: Once | INTRAMUSCULAR | Status: AC | PRN
  Administered 2014-05-20: 100 mL via INTRAVENOUS

## 2014-05-21 ENCOUNTER — Encounter: Payer: Self-pay | Admitting: Family Medicine

## 2014-05-21 DIAGNOSIS — K8020 Calculus of gallbladder without cholecystitis without obstruction: Secondary | ICD-10-CM

## 2014-05-21 HISTORY — DX: Calculus of gallbladder without cholecystitis without obstruction: K80.20

## 2014-05-22 ENCOUNTER — Ambulatory Visit (HOSPITAL_BASED_OUTPATIENT_CLINIC_OR_DEPARTMENT_OTHER): Payer: Medicare Other | Admitting: Internal Medicine

## 2014-05-22 ENCOUNTER — Telehealth: Payer: Self-pay | Admitting: Internal Medicine

## 2014-05-22 ENCOUNTER — Encounter: Payer: Self-pay | Admitting: Internal Medicine

## 2014-05-22 VITALS — BP 129/89 | HR 74 | Temp 98.0°F | Resp 18 | Ht 62.0 in | Wt 145.2 lb

## 2014-05-22 DIAGNOSIS — Z85118 Personal history of other malignant neoplasm of bronchus and lung: Secondary | ICD-10-CM

## 2014-05-22 DIAGNOSIS — C3491 Malignant neoplasm of unspecified part of right bronchus or lung: Secondary | ICD-10-CM

## 2014-05-22 NOTE — Progress Notes (Signed)
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   PRIOR THERAPY: Status post wedge resection of the right upper lobe lung nodule under the care of Dr. Roxan Hockey on 10/20/2011.   CURRENT THERAPY: Observation.  INTERVAL HISTORY: [name] 73 y.o. female returns to the clinic today for six-month followup visit. She is observation for more than 2 years now. The patient is feeling fine today with no specific complaints. She denied having any significant chest pain, shortness of breath, cough or hemoptysis. She denied having any weight loss or night sweats. The patient had repeat CT scan of the chest performed recently and she is here for evaluation and discussion of her scan results.   MEDICAL HISTORY: Past Medical History  Diagnosis Date  . COPD (chronic obstructive pulmonary disease)   . Depression   . Anxiety   . Gout     last time 6-68yrs ago   . Migraine   . OCD (obsessive compulsive disorder)   . Tinea corporis   . Acute bronchitis   . Allergic rhinitis   . Gout   . Macrocytosis   . Skin rash   . Hypertension     takes Amlodipine nightly  . Hyperlipidemia     taking Flax Seed Oil and Fish Oil  . Emphysema   . Hx of migraines 90's    after menopause HA stopped  . Arthritis     all over  . Osteopenia     takes Calcium and Vit D bid  . Back pain   . Liver cyst   . IBS (irritable bowel syndrome)   . Bilateral cataracts   . Hot flashes     takes Prozac daily  . Lung nodule     right  . Gallstones   . Arthritis   . Chicken pox   . Seasonal allergies   . Hypertension   . History of blood transfusion   . Pneumonia     x 2 ;last time back in 1999  . Lung cancer   . Anemia   . S/P right THA, AA - Dr.  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 Smoking 09/12/2011  . Cholelithiasis 05/21/2014    On CT scan 2015     ALLERGIES:  is allergic to clindamycin/lincomycin; penicillins; spiriva; and sulfa antibiotics.  MEDICATIONS:  Current Outpatient Prescriptions  Medication Sig Dispense Refill  . acetaminophen (TYLENOL) 500 MG tablet Take 500 mg by mouth every 6 (six) hours as needed.      Marland Kitchen albuterol (PROAIR HFA) 108 (90 BASE) MCG/ACT inhaler Inhale 2 puffs into the lungs every 6 (six) hours as needed for wheezing or shortness of breath.  1 Inhaler  1  . amLODipine (NORVASC) 5 MG tablet TAKE ONE TABLET BY MOUTH AT BEDTIME  90 tablet  0  . budesonide-formoterol (SYMBICORT) 80-4.5 MCG/ACT inhaler Inhale 2 puffs into the lungs 2 (two) times daily.  1 Inhaler  12  . Cholecalciferol (VITAMIN D3) 3000 UNITS TABS Take 1,200 Units by mouth daily.      Marland Kitchen FLUoxetine (PROZAC) 20 MG tablet Take 1 tablet (20 mg total) by mouth 2 (two) times daily.  180 tablet  3  . Loratadine (CLARITIN PO) Take by  mouth.      . Multiple Vitamin (MULTIVITAMIN) tablet Take 1 tablet by mouth daily.      Marland Kitchen OVER THE COUNTER MEDICATION Tumeric qd      . vitamin E 400 UNIT capsule Take 400 Units by mouth daily.      . [DISCONTINUED] Calcium Carbonate-Vitamin D (CALCIUM + D PO) Take by mouth daily. 500+400        No current facility-administered medications for this visit.    SURGICAL HISTORY:  Past Surgical History  Procedure Laterality Date  . Inguinal hernia repair  2000  . Neuroplasty decompression median nerve at carpal tunnel    . Rotator cuff repair  2001    right  . Tonsillectomy    . Tonsillectomy  as a child    and adenoids  . Dilation and curettage of uterus      at age 68  . Cyst removed  >41yrs ago    from left leg  . Carpal tunnel release  80's    right   . Total abdominal hysterectomy  2003  . Total hip arthroplasty  2011    left  . Colonoscopy    . Video bronchoscopy  10/20/2011     Procedure: VIDEO BRONCHOSCOPY;  Surgeon: Grace Isaac, MD;  Location: [facility] OR;  Service: Thoracic;  Laterality: N/A;  . Wedge resection  10-20-2011    rt lung  - for lung cancer  . Total hip arthroplasty  03/07/2012    Procedure: TOTAL HIP ARTHROPLASTY ANTERIOR APPROACH;  Surgeon: [name], MD;  Location: [facility] ORS;  Service: Orthopedics;  Laterality: Right;  . Total knee arthroplasty Right 01/02/2013    Procedure: RIGHT TOTAL KNEE ARTHROPLASTY;  Surgeon: [name], MD;  Location: [facility] ORS;  Service: Orthopedics;  Laterality: Right;  . Total knee arthroplasty Left 04/03/2013    Procedure: LEFT TOTAL KNEE ARTHROPLASTY;  Surgeon: [name], MD;  Location: [facility] ORS;  Service: Orthopedics;  Laterality: Left;    REVIEW OF SYSTEMS:  A comprehensive review of systems was negative.   PHYSICAL EXAMINATION: General appearance: alert, cooperative and no distress Head: Normocephalic, without obvious abnormality, atraumatic Neck: no adenopathy, no JVD, supple, symmetrical, trachea midline and thyroid not enlarged, symmetric, no tenderness/mass/nodules Lymph nodes: Cervical, supraclavicular, and axillary nodes normal. Resp: clear to auscultation bilaterally Cardio: regular rate and rhythm, S1, S2 normal, no murmur, click, rub or gallop GI: soft, non-tender; bowel sounds normal; no masses,  no organomegaly Extremities: extremities normal, atraumatic, no cyanosis or edema  ECOG PERFORMANCE STATUS: 1 - Symptomatic but completely ambulatory  Blood pressure 129/89, pulse 74, temperature 98 F (36.7 C), temperature source Oral, resp. rate 18, height 5\' 2"  (1.575 m), weight 145 lb 3.2 oz (65.862 kg), SpO2 94.00%.  LABORATORY DATA: Lab Results  Component Value Date   WBC 6.3 05/20/2014   HGB 14.8 05/20/2014   HCT 44.6 05/20/2014   MCV 97.1 05/20/2014   PLT 193 05/20/2014      Chemistry      Component Value Date/Time   NA 138 05/20/2014 0853   NA 131* 04/05/2013 0453   K 4.1 05/20/2014 0853    K 3.9 04/05/2013 0453   CL 98 04/05/2013 0453   CL 103 11/13/2012 0939   CO2 26 05/20/2014 0853   CO2 28 04/05/2013 0453   BUN 8.6 05/20/2014 0853   BUN 5* 04/05/2013 0453   CREATININE 0.8 05/20/2014 0853   CREATININE 0.67 04/05/2013 0453  Component Value Date/Time   CALCIUM 9.6 05/20/2014 0853   CALCIUM 8.4 04/05/2013 0453   ALKPHOS 69 05/20/2014 0853   ALKPHOS 44 10/22/2011 0335   AST 19 05/20/2014 0853   AST 20 10/22/2011 0335   ALT 18 05/20/2014 0853   ALT 14 10/22/2011 0335   BILITOT 0.57 05/20/2014 0853   BILITOT 0.5 10/22/2011 0335       RADIOGRAPHIC STUDIES: Ct Chest W Contrast  05/20/2014   CLINICAL DATA:  Right-sided lung cancer restaging. Surgery only in 2013.  EXAM: CT CHEST WITH CONTRAST  TECHNIQUE: Multidetector CT imaging of the chest was performed during intravenous contrast administration.  CONTRAST:  161mL OMNIPAQUE IOHEXOL 300 MG/ML  SOLN  COMPARISON:  11/14/2013  FINDINGS: Lungs/Pleura:  Advanced centrilobular emphysema.  Dependent lingular volume loss, new.  Surgical changes within the right upper lobe. No evidence of locally recurrent disease. No pleural fluid.  Heart/Mediastinum: No supraclavicular adenopathy. Aortic and branch vessel atherosclerosis. Multivessel coronary artery atherosclerosis. Tortuous descending thoracic aorta. Normal heart size, without pericardial effusion. No central pulmonary embolism, on this non-dedicated study. Tiny hiatal hernia. No mediastinal or hilar adenopathy.  Upper Abdomen: Multiple hepatic cysts. Complex cyst in the left lobe of the liver, as evidenced by similar calcification.  Splenule. Cholelithiasis. Normal imaged portions of the stomach, pancreas, adrenal glands, kidneys.  Bones/Musculoskeletal: Postsurgical or posttraumatic deformities of right-sided ribs.  IMPRESSION: 1. Surgical changes in the right upper lobe, without locally recurrent or metastatic disease. 2. Advanced emphysema. 3. Cholelithiasis.   Electronically Signed    By: Abigail Miyamoto M.D.   On: 05/20/2014 10:39   ASSESSMENT AND PLAN: This is a very pleasant 73 years old white female with history of stage IA non-small cell lung cancer, squamous cell carcinoma diagnosed in March of 2013 status post wedge resection of the right upper lobe and currently on observation. The patient is doing fine and she has no evidence for disease recurrence. I discussed the scan results with her and recommended for her to continue on observation with repeat CT scan of the chest in 1 year. She was advised to call immediately if she has any concerning symptoms in the interval.  The patient voices understanding of current disease status and treatment options and is in agreement with the current care plan.  All questions were answered. The patient knows to call the clinic with any problems, questions or concerns. We can certainly see the patient much sooner if necessary.  Disclaimer: This note was dictated with voice recognition software. Similar sounding words can inadvertently be transcribed and may not be corrected upon review.

## 2014-05-22 NOTE — Telephone Encounter (Signed)
Gave avs & sch apt for April 2016. Ct will call to set up scan apt.-Amber

## 2014-05-23 ENCOUNTER — Encounter (HOSPITAL_COMMUNITY): Payer: Self-pay | Admitting: Pharmacy Technician

## 2014-05-27 ENCOUNTER — Ambulatory Visit: Payer: Medicare Other | Admitting: Internal Medicine

## 2014-05-27 ENCOUNTER — Encounter (HOSPITAL_COMMUNITY): Payer: Self-pay

## 2014-05-27 ENCOUNTER — Encounter (HOSPITAL_COMMUNITY)
Admission: RE | Admit: 2014-05-27 | Discharge: 2014-05-27 | Disposition: A | Discharge status: Home / Self Care | Payer: Medicare Other | Source: Ambulatory Visit | Attending: Orthopedic Surgery | Admitting: Orthopedic Surgery

## 2014-05-27 DIAGNOSIS — Z01818 Encounter for other preprocedural examination: Secondary | ICD-10-CM | POA: Insufficient documentation

## 2014-05-27 LAB — PROTIME-INR
INR: 1.03 (ref 0.00–1.49)
Prothrombin Time: 13.6 seconds (ref 11.6–15.2)

## 2014-05-27 LAB — URINALYSIS, ROUTINE W REFLEX MICROSCOPIC
Bilirubin Urine: NEGATIVE
Glucose, UA: NEGATIVE mg/dL
Hgb urine dipstick: NEGATIVE
Ketones, ur: NEGATIVE mg/dL
NITRITE: NEGATIVE
PH: 5 (ref 5.0–8.0)
Protein, ur: NEGATIVE mg/dL
SPECIFIC GRAVITY, URINE: 1.025 (ref 1.005–1.030)
UROBILINOGEN UA: 0.2 mg/dL (ref 0.0–1.0)

## 2014-05-27 LAB — SURGICAL PCR SCREEN
MRSA, PCR: NEGATIVE
Staphylococcus aureus: NEGATIVE

## 2014-05-27 LAB — URINE MICROSCOPIC-ADD ON

## 2014-05-27 LAB — APTT: aPTT: 31 seconds (ref 24–37)

## 2014-05-27 NOTE — Pre-Procedure Instructions (Signed)
PT HAS CBC, DIFF AND BMET REPORTS IN EPIC - DONE 05/20/14.   PT, PTT, UA, T/S AND PCR AND EKG WERE DONE TODAY PREOP. CHEST CT REPORT IN EPIC FROM 05/20/14.

## 2014-05-27 NOTE — Patient Instructions (Signed)
YOUR SURGERY IS SCHEDULED AT [facility]  ON:  Monday  10/26  REPORT TO  SHORT STAY CENTER AT:  5:30 AM   DO NOT EAT OR DRINK ANYTHING AFTER MIDNIGHT THE NIGHT BEFORE YOUR SURGERY.  YOU MAY BRUSH YOUR TEETH, RINSE OUT YOUR MOUTH--BUT NO WATER, NO FOOD, NO CHEWING GUM, NO MINTS, NO CANDIES, NO CHEWING TOBACCO.  PLEASE TAKE THE FOLLOWING MEDICATIONS THE AM OF YOUR SURGERY WITH A FEW SIPS OF WATER:   FLUOXETINE, LORATADINE.  USE YOUR SYMBICORT.   DO NOT BRING VALUABLES, MONEY, CREDIT CARDS.  DO NOT WEAR JEWELRY, MAKE-UP, NAIL POLISH AND NO METAL PINS OR CLIPS IN YOUR HAIR. CONTACT LENS, DENTURES / PARTIALS, GLASSES SHOULD NOT BE WORN TO SURGERY AND IN MOST CASES-HEARING AIDS WILL NEED TO BE REMOVED.  BRING YOUR GLASSES CASE, ANY EQUIPMENT NEEDED FOR YOUR CONTACT LENS. FOR PATIENTS ADMITTED TO THE HOSPITAL--CHECK OUT TIME THE DAY OF DISCHARGE IS 11:00 AM.  ALL INPATIENT ROOMS ARE PRIVATE - WITH BATHROOM, TELEPHONE, TELEVISION AND WIFI INTERNET.    PLEASE BE AWARE THAT YOU MAY NEED ADDITIONAL BLOOD DRAWN DAY OF YOUR SURGERY  _______________________________________________________________________        _______________________________________________________________________   [facility] - Preparing for Surgery Before surgery, you can play an important role.  Because skin is not sterile, your skin needs to be as free of germs as possible.  You can reduce the number of germs on your skin by washing with CHG (chlorahexidine gluconate) soap before surgery.  CHG is an antiseptic cleaner which kills germs and bonds with the skin to continue killing germs even after washing. Please DO NOT use if you have an allergy to CHG or antibacterial soaps.  If your skin becomes reddened/irritated stop using the CHG and inform your nurse when you arrive at Short Stay. Do not shave (including legs and underarms) for at least 48 hours prior to the first CHG shower.  You may shave your  face/neck. Please follow these instructions carefully:  1.  Shower with CHG Soap the night before surgery and the  morning of Surgery.  2.  If you choose to wash your hair, wash your hair first as usual with your  normal  shampoo.  3.  After you shampoo, rinse your hair and body thoroughly to remove the  shampoo.                           4.  Use CHG as you would any other liquid soap.  You can apply chg directly  to the skin and wash                       Gently with a scrungie or clean washcloth.  5.  Apply the CHG Soap to your body ONLY FROM THE NECK DOWN.   Do not use on face/ open                           Wound or open sores. Avoid contact with eyes, ears mouth and genitals (private parts).                       Wash face,  Genitals (private parts) with your normal soap.             6.  Wash thoroughly, paying special attention to the area where your surgery  will  be performed.  7.  Thoroughly rinse your body with warm water from the neck down.  8.  DO NOT shower/wash with your normal soap after using and rinsing off  the CHG Soap.                9.  Pat yourself dry with a clean towel.            10.  Wear clean pajamas.            11.  Place clean sheets on your bed the night of your first shower and do not  sleep with pets. Day of Surgery : Do not apply any lotions/deodorants the morning of surgery.  Please wear clean clothes to the hospital/surgery center.  FAILURE TO FOLLOW THESE INSTRUCTIONS MAY RESULT IN THE CANCELLATION OF YOUR SURGERY PATIENT SIGNATURE_________________________________  NURSE SIGNATURE__________________________________  ________________________________________________________________________   [name]  An incentive spirometer is a tool that can help keep your lungs clear and active. This tool measures how well you are filling your lungs with each breath. Taking long deep breaths may help reverse or decrease the chance of developing breathing  (pulmonary) problems (especially infection) following:  A long period of time when you are unable to move or be active. BEFORE THE PROCEDURE   If the spirometer includes an indicator to show your best effort, your nurse or respiratory therapist will set it to a desired goal.  If possible, sit up straight or lean slightly forward. Try not to slouch.  Hold the incentive spirometer in an upright position. INSTRUCTIONS FOR USE  1. Sit on the edge of your bed if possible, or sit up as far as you can in bed or on a chair. 2. Hold the incentive spirometer in an upright position. 3. Breathe out normally. 4. Place the mouthpiece in your mouth and seal your lips tightly around it. 5. Breathe in slowly and as deeply as possible, raising the piston or the ball toward the top of the column. 6. Hold your breath for 3-5 seconds or for as long as possible. Allow the piston or ball to fall to the bottom of the column. 7. Remove the mouthpiece from your mouth and breathe out normally. 8. Rest for a few seconds and repeat Steps 1 through 7 at least 10 times every 1-2 hours when you are awake. Take your time and take a few normal breaths between deep breaths. 9. The spirometer may include an indicator to show your best effort. Use the indicator as a goal to work toward during each repetition. 10. After each set of 10 deep breaths, practice coughing to be sure your lungs are clear. If you have an incision (the cut made at the time of surgery), support your incision when coughing by placing a pillow or rolled up towels firmly against it. Once you are able to get out of bed, walk around indoors and cough well. You may stop using the incentive spirometer when instructed by your caregiver.  RISKS AND COMPLICATIONS  Take your time so you do not get dizzy or light-headed.  If you are in pain, you may need to take or ask for pain medication before doing incentive spirometry. It is harder to take a deep breath if you  are having pain. AFTER USE  Rest and breathe slowly and easily.  It can be helpful to keep track of a log of your progress. Your caregiver can provide you with a simple table to help with this.  If you are using the spirometer at home, follow these instructions: [place] IF:   You are having difficultly using the spirometer.  You have trouble using the spirometer as often as instructed.  Your pain medication is not giving enough relief while using the spirometer.  You develop fever of 100.5 F (38.1 C) or higher. SEEK IMMEDIATE MEDICAL CARE IF:   You cough up bloody sputum that had not been present before.  You develop fever of 102 F (38.9 C) or greater.  You develop worsening pain at or near the incision site. MAKE SURE YOU:   Understand these instructions.  Will watch your condition.  Will get help right away if you are not doing well or get worse. Document Released: 12/06/2006 Document Revised: 10/18/2011 Document Reviewed: 02/06/2007 ExitCare Patient Information 2014 Exit[employer], [place].   ________________________________________________________________________  WHAT IS A BLOOD TRANSFUSION? Blood Transfusion Information  A transfusion is the replacement of blood or some of its parts. Blood is made up of multiple cells which provide different functions.  Red blood cells carry oxygen and are used for blood loss replacement.  White blood cells fight against infection.  Platelets control bleeding.  Plasma helps clot blood.  Other blood products are available for specialized needs, such as hemophilia or other clotting disorders. BEFORE THE TRANSFUSION  Who gives blood for transfusions?   Healthy volunteers who are fully evaluated to make sure their blood is safe. This is blood bank blood. Transfusion therapy is the safest it has ever been in the practice of medicine. Before blood is taken from a donor, a complete history is taken to make sure that person has  no history of diseases nor engages in risky social behavior (examples are intravenous drug use or sexual activity with multiple partners). The donor's travel history is screened to minimize risk of transmitting infections, such as malaria. The donated blood is tested for signs of infectious diseases, such as HIV and hepatitis. The blood is then tested to be sure it is compatible with you in order to minimize the chance of a transfusion reaction. If you or a relative donates blood, this is often done in anticipation of surgery and is not appropriate for emergency situations. It takes many days to process the donated blood. RISKS AND COMPLICATIONS Although transfusion therapy is very safe and saves many lives, the main dangers of transfusion include:   Getting an infectious disease.  Developing a transfusion reaction. This is an allergic reaction to something in the blood you were given. Every precaution is taken to prevent this. The decision to have a blood transfusion has been considered carefully by your caregiver before blood is given. Blood is not given unless the benefits outweigh the risks. AFTER THE TRANSFUSION  Right after receiving a blood transfusion, you will usually feel much better and more energetic. This is especially true if your red blood cells have gotten low (anemic). The transfusion raises the level of the red blood cells which carry oxygen, and this usually causes an energy increase.  The nurse administering the transfusion will monitor you carefully for complications. HOME CARE INSTRUCTIONS  No special instructions are needed after a transfusion. You may find your energy is better. Speak with your caregiver about any limitations on activity for underlying diseases you may have. SEEK MEDICAL CARE IF:   Your condition is not improving after your transfusion.  You develop redness or irritation at the intravenous (IV) site. SEEK IMMEDIATE MEDICAL CARE IF:  Any of the  following  symptoms occur over the next 12 hours:  Shaking chills.  You have a temperature by mouth above 102 F (38.9 C), not controlled by medicine.  Chest, back, or muscle pain.  People around you feel you are not acting correctly or are confused.  Shortness of breath or difficulty breathing.  Dizziness and fainting.  You get a rash or develop hives.  You have a decrease in urine output.  Your urine turns a dark color or changes to pink, red, or brown. Any of the following symptoms occur over the next 10 days:  You have a temperature by mouth above 102 F (38.9 C), not controlled by medicine.  Shortness of breath.  Weakness after normal activity.  The white part of the eye turns yellow (jaundice).  You have a decrease in the amount of urine or are urinating less often.  Your urine turns a dark color or changes to pink, red, or brown. Document Released: 07/23/2000 Document Revised: 10/18/2011 Document Reviewed: 03/11/2008 [facility] Patient Information 2014 [place], [place].  _______________________________________________________________________

## 2014-05-30 NOTE — Anesthesia Preprocedure Evaluation (Addendum)
Anesthesia Evaluation  Patient identified by MRN, date of birth, ID band Patient awake    Reviewed: Allergy & Precautions, H&P , NPO status , Patient's Chart, lab work & pertinent test results  History of Anesthesia Complications (+) PONV and history of anesthetic complications  Airway Mallampati: II TM Distance: >3 FB Neck ROM: Full    Dental no notable dental hx. (+) Dental Advisory Given, Missing,    Pulmonary COPD COPD inhaler, former smoker,  breath sounds clear to auscultation  Pulmonary exam normal       Cardiovascular Exercise Tolerance: Good hypertension, Pt. on medications Rhythm:Regular Rate:Normal     Neuro/Psych  Headaches, PSYCHIATRIC DISORDERS Anxiety Depression    GI/Hepatic negative GI ROS, Neg liver ROS,   Endo/Other  negative endocrine ROS  Renal/GU negative Renal ROS  negative genitourinary   Musculoskeletal  (+) Arthritis -, Osteoarthritis,    Abdominal   Peds negative pediatric ROS (+)  Hematology negative hematology ROS (+)   Anesthesia Other Findings   Reproductive/Obstetrics negative OB ROS                          Anesthesia Physical Anesthesia Plan  ASA: III  Anesthesia Plan: Spinal   Post-op Pain Management:    Induction: Intravenous  Airway Management Planned: Nasal Cannula  Additional Equipment:   Intra-op Plan:   Post-operative Plan: Extubation in OR  Informed Consent: I have reviewed the patients History and Physical, chart, labs and discussed the procedure including the risks, benefits and alternatives for the proposed anesthesia with the patient or authorized representative who has indicated his/her understanding and acceptance.   Dental advisory given  Plan Discussed with: CRNA  Anesthesia Plan Comments:         Anesthesia Quick Evaluation

## 2014-05-31 NOTE — H&P (Signed)
[name] is an 73 y.o. female.    Chief Complaint:   Right knee crepitus / pain  S/p right TKA  Procedure:    Right knee open scar debridement  HPI: Pt is a 73 y.o. female complaining of right knee pain and crepitus. Pain had continually increased since the beginning. X-rays in the clinic show previous TKA of the right knee.  She has full active knee extension; however on the right knee, there is more painful crepitation with active motion.  At this point with more significant crepitation related to excessive scarring in the right knee. I discussed with her that we could continue to observe this conservatively or she could consider having an open scar debridement based on the way that she feels if this right knee is bothering her. She feels that this may be a necessity. We would plan on an overnight hospital stay with antibiotics.  Pt has tried various conservative treatments which have failed to alleviate their symptoms. Various options are discussed with the patient. Risks, benefits and expectations were discussed with the patient. Patient understand the risks, benefits and expectations and wishes to proceed with surgery.   PCP: [name]., DO  D/C Plans:      Home with HHPT  Post-op Meds:       No Rx given  Tranexamic Acid:      To be given - IV    Decadron:      Is to be given  FYI:     ASA post-op  Norco post-op   PMH: Past Medical History  Diagnosis Date  . COPD (chronic obstructive pulmonary disease)   . Depression   . Anxiety   . Gout     last time 6-62yrs ago   . OCD (obsessive compulsive disorder)   . Tinea corporis   . Acute bronchitis   . Allergic rhinitis   . Macrocytosis   . Skin rash   . Hypertension     takes Amlodipine nightly  . Hyperlipidemia     taking Flax Seed Oil and Fish Oil  . Emphysema   . Hx of migraines 90's    after menopause HA stopped  . Arthritis     all over  . Osteopenia     takes Calcium and Vit D bid  . Back pain    RESOLVED AFTE BILATERAL HIP REPLACEMENTS  . Liver cyst   . IBS (irritable bowel syndrome)   . Bilateral cataracts   . Hot flashes     takes Prozac daily  . Arthritis   . Chicken pox   . Seasonal allergies   . Hypertension   . History of blood transfusion   . Pneumonia     x 2 ;last time back in 1999  . Lung cancer   . Anemia   . 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 Smoking 09/12/2011  . Cholelithiasis 05/21/2014    On CT scan 2015   . PONV (postoperative nausea and vomiting)   . Migraine     MIGRAINES RESOLVED WITH MENOPAUSE    PSH: Past Surgical History  Procedure Laterality Date  . Inguinal hernia repair  2000  . Neuroplasty decompression median nerve at carpal tunnel    . Rotator cuff repair  2001    right  . Tonsillectomy  as a child    and adenoids  . Dilation and curettage of uterus  at age 52  . Cyst removed  >30yrs ago    from left leg  . Carpal tunnel release  80's    right   . Total abdominal hysterectomy  2003  . Total hip arthroplasty  2011    left  . Colonoscopy    . Video bronchoscopy  10/20/2011    Procedure: VIDEO BRONCHOSCOPY;  Surgeon: Grace Isaac, MD;  Location: [facility] OR;  Service: Thoracic;  Laterality: N/A;  . Wedge resection  10-20-2011    rt lung  - for lung cancer  . Total hip arthroplasty  03/07/2012    Procedure: TOTAL HIP ARTHROPLASTY ANTERIOR APPROACH;  Surgeon: [name], MD;  Location: [facility] ORS;  Service: Orthopedics;  Laterality: Right;  . Total knee arthroplasty Right 01/02/2013    Procedure: RIGHT TOTAL KNEE ARTHROPLASTY;  Surgeon: [name], MD;  Location: [facility] ORS;  Service: Orthopedics;  Laterality: Right;  . Total knee arthroplasty Left 04/03/2013    Procedure: LEFT TOTAL KNEE ARTHROPLASTY;  Surgeon: [name], MD;  Location: [facility] ORS;  Service: Orthopedics;  Laterality: Left;    Social History:  reports that she quit smoking about 2 years ago. Her smoking use included  Cigarettes. She has a 25 pack-year smoking history. She has never used smokeless tobacco. She reports that she drinks alcohol. She reports that she does not use illicit drugs.  Allergies:  Allergies  Allergen Reactions  . Clindamycin/Lincomycin Nausea And Vomiting  . Penicillins Rash       . Spiriva [Tiotropium Bromide Monohydrate]     General intolerance  . Sulfa Antibiotics Hives and Rash    Severe Hives with skin reaction    Medications: No current facility-administered medications for this encounter.   Current Outpatient Prescriptions  Medication Sig Dispense Refill  . acetaminophen (TYLENOL) 500 MG tablet Take 500 mg by mouth every 6 (six) hours as needed for mild pain or headache.       . albuterol (PROAIR HFA) 108 (90 BASE) MCG/ACT inhaler Inhale 2 puffs into the lungs every 6 (six) hours as needed for wheezing or shortness of breath.  1 Inhaler  1  . amLODipine (NORVASC) 5 MG tablet Take 5 mg by mouth at bedtime.      . budesonide-formoterol (SYMBICORT) 80-4.5 MCG/ACT inhaler Inhale 2 puffs into the lungs 2 (two) times daily.  1 Inhaler  12  . Cholecalciferol (VITAMIN D-3 PO) Take 1,200 mg by mouth every morning.      Marland Kitchen FLUoxetine (PROZAC) 20 MG tablet Take 20 mg by mouth 2 (two) times daily.      . Ibuprofen (ADVIL) 200 MG CAPS Take 200 mg by mouth every 6 (six) hours as needed (pain).      Marland Kitchen loratadine (CLARITIN) 10 MG tablet Take 10 mg by mouth daily as needed for allergies.      . Multiple Vitamin (MULTIVITAMIN) tablet Take 1 tablet by mouth every morning.       . sodium chloride (OCEAN) 0.65 % SOLN nasal spray Place 1 spray into both nostrils daily as needed for congestion.      . TURMERIC PO Take 1 tablet by mouth at bedtime.      . vitamin E 400 UNIT capsule Take 400 Units by mouth every morning.       . [DISCONTINUED] Calcium Carbonate-Vitamin D (CALCIUM + D PO) Take by mouth daily. 500+400          Review of Systems  Constitutional: Negative.   Eyes:  Negative.     Respiratory: Negative.   Cardiovascular: Negative.   Gastrointestinal: Positive for diarrhea.  Genitourinary: Negative.   Musculoskeletal: Positive for joint pain.  Skin: Negative.   Neurological: Positive for headaches.  Endo/Heme/Allergies: Positive for environmental allergies.  Psychiatric/Behavioral: Positive for depression. The patient is nervous/anxious.      Physical Exam  Constitutional: She is oriented to person, place, and time. She appears well-developed and well-nourished.  HENT:  Head: Normocephalic and atraumatic.  Mouth/Throat: Oropharynx is clear and moist.  Eyes: Pupils are equal, round, and reactive to light.  Neck: Neck supple. No JVD present. No tracheal deviation present. No thyromegaly present.  Cardiovascular: Normal rate, regular rhythm, normal heart sounds and intact distal pulses.   Respiratory: Effort normal and breath sounds normal. No stridor. No respiratory distress. She has no wheezes.  GI: Soft. There is no tenderness.  Musculoskeletal:       Right knee: She exhibits decreased range of motion, swelling and laceration (healed previous incision). She exhibits no ecchymosis, no deformity, no erythema and normal alignment. Tenderness found.  Lymphadenopathy:    She has no cervical adenopathy.  Neurological: She is alert and oriented to person, place, and time.  Skin: Skin is warm and dry.  Psychiatric: She has a normal mood and affect.       Assessment/Plan Assessment:    Right knee crepitus / pain  S/p right TKA   Plan: Patient will undergo a right knee open scar debridement on 06/03/2014 per Dr. Alvan Dame at [facility]. Risks benefits and expectations were discussed with the patient. Patient understand risks, benefits and expectations and wishes to proceed.   West Pugh Twilia Yaklin   PA-C  05/31/2014, 4:24 PM

## 2014-06-03 ENCOUNTER — Observation Stay (HOSPITAL_COMMUNITY)
Admission: RE | Admit: 2014-06-03 | Discharge: 2014-06-04 | Disposition: A | Discharge status: Home / Self Care | Payer: Medicare Other | Source: Ambulatory Visit | Attending: Orthopedic Surgery | Admitting: Orthopedic Surgery

## 2014-06-03 ENCOUNTER — Encounter (HOSPITAL_COMMUNITY): Payer: Self-pay | Admitting: *Deleted

## 2014-06-03 ENCOUNTER — Encounter (HOSPITAL_COMMUNITY): Payer: Medicare Other | Admitting: Anesthesiology

## 2014-06-03 ENCOUNTER — Encounter (HOSPITAL_COMMUNITY)
Admission: RE | Disposition: A | Discharge status: Home / Self Care | Payer: Self-pay | Source: Ambulatory Visit | Attending: Orthopedic Surgery

## 2014-06-03 ENCOUNTER — Ambulatory Visit (HOSPITAL_COMMUNITY): Payer: Medicare Other | Admitting: Anesthesiology

## 2014-06-03 DIAGNOSIS — J439 Emphysema, unspecified: Secondary | ICD-10-CM | POA: Diagnosis not present

## 2014-06-03 DIAGNOSIS — L910 Hypertrophic scar: Secondary | ICD-10-CM | POA: Diagnosis not present

## 2014-06-03 DIAGNOSIS — L905 Scar conditions and fibrosis of skin: Secondary | ICD-10-CM | POA: Diagnosis present

## 2014-06-03 DIAGNOSIS — M238X1 Other internal derangements of right knee: Secondary | ICD-10-CM | POA: Diagnosis not present

## 2014-06-03 DIAGNOSIS — Z882 Allergy status to sulfonamides status: Secondary | ICD-10-CM | POA: Insufficient documentation

## 2014-06-03 DIAGNOSIS — Z881 Allergy status to other antibiotic agents status: Secondary | ICD-10-CM | POA: Insufficient documentation

## 2014-06-03 DIAGNOSIS — Z888 Allergy status to other drugs, medicaments and biological substances status: Secondary | ICD-10-CM | POA: Insufficient documentation

## 2014-06-03 DIAGNOSIS — F419 Anxiety disorder, unspecified: Secondary | ICD-10-CM | POA: Diagnosis not present

## 2014-06-03 DIAGNOSIS — Z87891 Personal history of nicotine dependence: Secondary | ICD-10-CM | POA: Insufficient documentation

## 2014-06-03 DIAGNOSIS — Z79899 Other long term (current) drug therapy: Secondary | ICD-10-CM | POA: Insufficient documentation

## 2014-06-03 DIAGNOSIS — E785 Hyperlipidemia, unspecified: Secondary | ICD-10-CM | POA: Diagnosis not present

## 2014-06-03 DIAGNOSIS — F329 Major depressive disorder, single episode, unspecified: Secondary | ICD-10-CM | POA: Diagnosis not present

## 2014-06-03 DIAGNOSIS — I10 Essential (primary) hypertension: Secondary | ICD-10-CM | POA: Diagnosis not present

## 2014-06-03 DIAGNOSIS — F42 Obsessive-compulsive disorder: Secondary | ICD-10-CM | POA: Insufficient documentation

## 2014-06-03 DIAGNOSIS — Z880 Allergy status to penicillin: Secondary | ICD-10-CM | POA: Insufficient documentation

## 2014-06-03 DIAGNOSIS — M8580 Other specified disorders of bone density and structure, unspecified site: Secondary | ICD-10-CM | POA: Diagnosis not present

## 2014-06-03 DIAGNOSIS — K589 Irritable bowel syndrome without diarrhea: Secondary | ICD-10-CM | POA: Insufficient documentation

## 2014-06-03 HISTORY — PX: EXCISIONAL TOTAL KNEE ARTHROPLASTY: SHX5015

## 2014-06-03 LAB — TYPE AND SCREEN
ABO/RH(D): A POS
Antibody Screen: NEGATIVE

## 2014-06-03 SURGERY — EXCISIONAL TOTAL KNEE ARTHROPLASTY
Anesthesia: Spinal | Site: Knee | Laterality: Right

## 2014-06-03 MED ORDER — SODIUM CHLORIDE 0.9 % IJ SOLN
INTRAMUSCULAR | Status: AC
  Filled 2014-06-03: qty 50

## 2014-06-03 MED ORDER — SODIUM CHLORIDE 0.9 % IV SOLN
INTRAVENOUS | Status: DC
  Administered 2014-06-03 (×2): via INTRAVENOUS
  Filled 2014-06-03 (×4): qty 1000

## 2014-06-03 MED ORDER — PROPOFOL 10 MG/ML IV BOLUS
INTRAVENOUS | Status: AC
  Filled 2014-06-03: qty 20

## 2014-06-03 MED ORDER — DEXAMETHASONE SODIUM PHOSPHATE 10 MG/ML IJ SOLN: 10.0000 mg | Freq: Once | INTRAMUSCULAR | Status: DC

## 2014-06-03 MED ORDER — BISACODYL 10 MG RE SUPP: 10.0000 mg | Freq: Every day | RECTAL | Status: DC | PRN

## 2014-06-03 MED ORDER — ONDANSETRON HCL 4 MG/2ML IJ SOLN
INTRAMUSCULAR | Status: AC
  Filled 2014-06-03: qty 2

## 2014-06-03 MED ORDER — HYDROCODONE-ACETAMINOPHEN 7.5-325 MG PO TABS
1.0000 | ORAL_TABLET | ORAL | Status: DC
  Administered 2014-06-03: 1 via ORAL
  Administered 2014-06-03 – 2014-06-04 (×4): 2 via ORAL
  Filled 2014-06-03 (×5): qty 2

## 2014-06-03 MED ORDER — METOCLOPRAMIDE HCL 5 MG/ML IJ SOLN: 5.0000 mg | Freq: Three times a day (TID) | INTRAMUSCULAR | Status: DC | PRN

## 2014-06-03 MED ORDER — ATROPINE SULFATE 0.4 MG/ML IJ SOLN
INTRAMUSCULAR | Status: AC
  Filled 2014-06-03: qty 1

## 2014-06-03 MED ORDER — VANCOMYCIN HCL IN DEXTROSE 1-5 GM/200ML-% IV SOLN
1000.0000 mg | Freq: Two times a day (BID) | INTRAVENOUS | Status: AC
  Administered 2014-06-03: 1000 mg via INTRAVENOUS
  Filled 2014-06-03: qty 200

## 2014-06-03 MED ORDER — METHOCARBAMOL 500 MG PO TABS
500.0000 mg | ORAL_TABLET | Freq: Four times a day (QID) | ORAL | Status: DC | PRN
  Administered 2014-06-03: 500 mg via ORAL
  Filled 2014-06-03: qty 1

## 2014-06-03 MED ORDER — 0.9 % SODIUM CHLORIDE (POUR BTL) OPTIME
TOPICAL | Status: DC | PRN
  Administered 2014-06-03: 1000 mL

## 2014-06-03 MED ORDER — AMLODIPINE BESYLATE 5 MG PO TABS
5.0000 mg | ORAL_TABLET | Freq: Every day | ORAL | Status: DC
  Administered 2014-06-03: 5 mg via ORAL
  Filled 2014-06-03 (×2): qty 1

## 2014-06-03 MED ORDER — SODIUM CHLORIDE 0.9 % IJ SOLN
INTRAMUSCULAR | Status: DC | PRN
  Administered 2014-06-03: 30 mL

## 2014-06-03 MED ORDER — DOCUSATE SODIUM 100 MG PO CAPS
100.0000 mg | ORAL_CAPSULE | Freq: Two times a day (BID) | ORAL | Status: DC
  Administered 2014-06-03: 100 mg via ORAL

## 2014-06-03 MED ORDER — ONDANSETRON HCL 4 MG/2ML IJ SOLN
INTRAMUSCULAR | Status: DC | PRN
  Administered 2014-06-03 (×2): 2 mg via INTRAVENOUS

## 2014-06-03 MED ORDER — MIDAZOLAM HCL 2 MG/2ML IJ SOLN
INTRAMUSCULAR | Status: AC
  Filled 2014-06-03: qty 2

## 2014-06-03 MED ORDER — METOCLOPRAMIDE HCL 10 MG PO TABS
5.0000 mg | ORAL_TABLET | Freq: Three times a day (TID) | ORAL | Status: DC | PRN
Start: 2014-06-03 — End: 2014-06-04

## 2014-06-03 MED ORDER — ONDANSETRON HCL 4 MG PO TABS: 4.0000 mg | ORAL_TABLET | Freq: Four times a day (QID) | ORAL | Status: DC | PRN

## 2014-06-03 MED ORDER — ASPIRIN EC 325 MG PO TBEC
325.0000 mg | DELAYED_RELEASE_TABLET | Freq: Two times a day (BID) | ORAL | Status: DC
Start: 2014-06-04 — End: 2014-06-04
  Administered 2014-06-04: 325 mg via ORAL
  Filled 2014-06-03 (×3): qty 1

## 2014-06-03 MED ORDER — SODIUM CHLORIDE 0.9 % IJ SOLN
INTRAMUSCULAR | Status: AC
  Filled 2014-06-03: qty 10

## 2014-06-03 MED ORDER — BUPIVACAINE-EPINEPHRINE (PF) 0.25% -1:200000 IJ SOLN
INTRAMUSCULAR | Status: AC
  Filled 2014-06-03: qty 30

## 2014-06-03 MED ORDER — FENTANYL CITRATE 0.05 MG/ML IJ SOLN: 25.0000 ug | INTRAMUSCULAR | Status: DC | PRN

## 2014-06-03 MED ORDER — TRANEXAMIC ACID 100 MG/ML IV SOLN
1000.0000 mg | Freq: Once | INTRAVENOUS | Status: AC
  Administered 2014-06-03: 1000 mg via INTRAVENOUS
  Filled 2014-06-03: qty 10

## 2014-06-03 MED ORDER — HYDROMORPHONE HCL 1 MG/ML IJ SOLN
0.5000 mg | INTRAMUSCULAR | Status: DC | PRN
  Administered 2014-06-03: 1 mg via INTRAVENOUS
  Filled 2014-06-03: qty 1

## 2014-06-03 MED ORDER — MIDAZOLAM HCL 5 MG/5ML IJ SOLN
INTRAMUSCULAR | Status: DC | PRN
  Administered 2014-06-03: 0.5 mg via INTRAVENOUS
  Administered 2014-06-03: 1 mg via INTRAVENOUS
  Administered 2014-06-03: 0.5 mg via INTRAVENOUS

## 2014-06-03 MED ORDER — DEXAMETHASONE SODIUM PHOSPHATE 10 MG/ML IJ SOLN
10.0000 mg | Freq: Once | INTRAMUSCULAR | Status: AC
  Administered 2014-06-04: 10 mg via INTRAVENOUS
  Filled 2014-06-03: qty 1

## 2014-06-03 MED ORDER — PHENOL 1.4 % MT LIQD: 1.0000 | OROMUCOSAL | Status: DC | PRN

## 2014-06-03 MED ORDER — MENTHOL 3 MG MT LOZG
1.0000 | LOZENGE | OROMUCOSAL | Status: DC | PRN
  Filled 2014-06-03: qty 9

## 2014-06-03 MED ORDER — ALBUTEROL SULFATE (2.5 MG/3ML) 0.083% IN NEBU: 2.5000 mg | INHALATION_SOLUTION | Freq: Four times a day (QID) | RESPIRATORY_TRACT | Status: DC | PRN

## 2014-06-03 MED ORDER — BUPIVACAINE IN DEXTROSE 0.75-8.25 % IT SOLN
INTRATHECAL | Status: DC | PRN
  Administered 2014-06-03: 1.8 mL via INTRATHECAL

## 2014-06-03 MED ORDER — SALINE SPRAY 0.65 % NA SOLN
1.0000 | Freq: Every day | NASAL | Status: DC | PRN
  Filled 2014-06-03: qty 44

## 2014-06-03 MED ORDER — BUDESONIDE-FORMOTEROL FUMARATE 80-4.5 MCG/ACT IN AERO
2.0000 | INHALATION_SPRAY | Freq: Two times a day (BID) | RESPIRATORY_TRACT | Status: DC
  Administered 2014-06-03: 2 via RESPIRATORY_TRACT
  Filled 2014-06-03: qty 6.9

## 2014-06-03 MED ORDER — DEXAMETHASONE SODIUM PHOSPHATE 10 MG/ML IJ SOLN
INTRAMUSCULAR | Status: DC | PRN
  Administered 2014-06-03: 10 mg via INTRAVENOUS

## 2014-06-03 MED ORDER — LACTATED RINGERS IV SOLN
INTRAVENOUS | Status: DC | PRN
  Administered 2014-06-03 (×2): via INTRAVENOUS

## 2014-06-03 MED ORDER — KETOROLAC TROMETHAMINE 30 MG/ML IJ SOLN
INTRAMUSCULAR | Status: DC | PRN
  Administered 2014-06-03: 30 mg

## 2014-06-03 MED ORDER — BUPIVACAINE-EPINEPHRINE (PF) 0.25% -1:200000 IJ SOLN
INTRAMUSCULAR | Status: DC | PRN
  Administered 2014-06-03: 30 mL

## 2014-06-03 MED ORDER — EPHEDRINE SULFATE 50 MG/ML IJ SOLN
INTRAMUSCULAR | Status: AC
  Filled 2014-06-03: qty 1

## 2014-06-03 MED ORDER — ALUM & MAG HYDROXIDE-SIMETH 200-200-20 MG/5ML PO SUSP: 30.0000 mL | ORAL | Status: DC | PRN

## 2014-06-03 MED ORDER — POLYETHYLENE GLYCOL 3350 17 G PO PACK: 17.0000 g | PACK | Freq: Two times a day (BID) | ORAL | Status: DC

## 2014-06-03 MED ORDER — METHOCARBAMOL 1000 MG/10ML IJ SOLN
500.0000 mg | Freq: Four times a day (QID) | INTRAVENOUS | Status: DC | PRN
  Administered 2014-06-03: 500 mg via INTRAVENOUS
  Filled 2014-06-03: qty 5

## 2014-06-03 MED ORDER — ONDANSETRON HCL 4 MG/2ML IJ SOLN: 4.0000 mg | Freq: Four times a day (QID) | INTRAMUSCULAR | Status: DC | PRN

## 2014-06-03 MED ORDER — LORATADINE 10 MG PO TABS
10.0000 mg | ORAL_TABLET | Freq: Every day | ORAL | Status: DC | PRN
  Filled 2014-06-03: qty 1

## 2014-06-03 MED ORDER — DIPHENHYDRAMINE HCL 25 MG PO CAPS: 25.0000 mg | ORAL_CAPSULE | Freq: Four times a day (QID) | ORAL | Status: DC | PRN

## 2014-06-03 MED ORDER — PHENYLEPHRINE HCL 10 MG/ML IJ SOLN
INTRAMUSCULAR | Status: AC
  Filled 2014-06-03: qty 1

## 2014-06-03 MED ORDER — CHLORHEXIDINE GLUCONATE 4 % EX LIQD: 60.0000 mL | Freq: Once | CUTANEOUS | Status: DC

## 2014-06-03 MED ORDER — VANCOMYCIN HCL IN DEXTROSE 1-5 GM/200ML-% IV SOLN
INTRAVENOUS | Status: AC
  Filled 2014-06-03: qty 200

## 2014-06-03 MED ORDER — PROPOFOL INFUSION 10 MG/ML OPTIME
INTRAVENOUS | Status: DC | PRN
  Administered 2014-06-03: 120 ug/kg/min via INTRAVENOUS

## 2014-06-03 MED ORDER — FENTANYL CITRATE 0.05 MG/ML IJ SOLN
INTRAMUSCULAR | Status: DC | PRN
  Administered 2014-06-03 (×2): 50 ug via INTRAVENOUS

## 2014-06-03 MED ORDER — KETOROLAC TROMETHAMINE 30 MG/ML IJ SOLN
INTRAMUSCULAR | Status: AC
  Filled 2014-06-03: qty 1

## 2014-06-03 MED ORDER — MAGNESIUM CITRATE PO SOLN: 1.0000 | Freq: Once | ORAL | Status: AC | PRN

## 2014-06-03 MED ORDER — ONDANSETRON HCL 4 MG/2ML IJ SOLN: 4.0000 mg | Freq: Once | INTRAMUSCULAR | Status: DC | PRN

## 2014-06-03 MED ORDER — SODIUM CHLORIDE 0.9 % IR SOLN
Status: DC | PRN
  Administered 2014-06-03: 1000 mL

## 2014-06-03 MED ORDER — FENTANYL CITRATE 0.05 MG/ML IJ SOLN
INTRAMUSCULAR | Status: AC
  Filled 2014-06-03: qty 2

## 2014-06-03 MED ORDER — VANCOMYCIN HCL IN DEXTROSE 1-5 GM/200ML-% IV SOLN
1000.0000 mg | INTRAVENOUS | Status: AC
  Administered 2014-06-03: 1000 mg via INTRAVENOUS

## 2014-06-03 MED ORDER — FERROUS SULFATE 325 (65 FE) MG PO TABS
325.0000 mg | ORAL_TABLET | Freq: Three times a day (TID) | ORAL | Status: DC
  Filled 2014-06-03 (×5): qty 1

## 2014-06-03 MED ORDER — FLUOXETINE HCL 20 MG PO CAPS
20.0000 mg | ORAL_CAPSULE | Freq: Two times a day (BID) | ORAL | Status: DC
  Administered 2014-06-03 (×2): 20 mg via ORAL
  Filled 2014-06-03 (×5): qty 1

## 2014-06-03 SURGICAL SUPPLY — 45 items
BANDAGE ELASTIC 6 VELCRO ST LF (GAUZE/BANDAGES/DRESSINGS) ×2 IMPLANT
BANDAGE ESMARK 6X9 LF (GAUZE/BANDAGES/DRESSINGS) ×1 IMPLANT
BNDG ESMARK 6X9 LF (GAUZE/BANDAGES/DRESSINGS) ×2
COVER MAYO STAND STRL (DRAPES) ×2 IMPLANT
DECANTER SPIKE VIAL GLASS SM (MISCELLANEOUS) ×2 IMPLANT
DERMABOND ADVANCED (GAUZE/BANDAGES/DRESSINGS) ×1
DERMABOND ADVANCED .7 DNX12 (GAUZE/BANDAGES/DRESSINGS) ×1 IMPLANT
DRAPE INCISE IOBAN 66X45 STRL (DRAPES) ×2 IMPLANT
DRAPE U-SHAPE 47X51 STRL (DRAPES) ×2 IMPLANT
DRSG AQUACEL AG ADV 3.5X10 (GAUZE/BANDAGES/DRESSINGS) ×2 IMPLANT
DURAPREP 26ML APPLICATOR (WOUND CARE) ×4 IMPLANT
ELECT REM PT RETURN 9FT ADLT (ELECTROSURGICAL) ×2
ELECTRODE REM PT RTRN 9FT ADLT (ELECTROSURGICAL) ×1 IMPLANT
FACESHIELD WRAPAROUND (MASK) ×4 IMPLANT
GLOVE BIO SURGEON STRL SZ8 (GLOVE) ×2 IMPLANT
GLOVE BIOGEL PI IND STRL 7.5 (GLOVE) ×1 IMPLANT
GLOVE BIOGEL PI IND STRL 8.5 (GLOVE) ×2 IMPLANT
GLOVE BIOGEL PI INDICATOR 7.5 (GLOVE) ×1
GLOVE BIOGEL PI INDICATOR 8.5 (GLOVE) ×2
GLOVE ECLIPSE 8.0 STRL XLNG CF (GLOVE) ×2 IMPLANT
GLOVE ORTHO TXT STRL SZ7.5 (GLOVE) ×2 IMPLANT
GOWN SPEC L3 XXLG W/TWL (GOWN DISPOSABLE) ×2 IMPLANT
GOWN STRL REUS W/TWL LRG LVL3 (GOWN DISPOSABLE) ×2 IMPLANT
GOWN STRL REUS W/TWL XL LVL3 (GOWN DISPOSABLE) ×2 IMPLANT
HANDPIECE INTERPULSE COAX TIP (DISPOSABLE) ×1
KIT BASIN OR (CUSTOM PROCEDURE TRAY) ×2 IMPLANT
MANIFOLD NEPTUNE II (INSTRUMENTS) ×2 IMPLANT
NDL SAFETY ECLIPSE 18X1.5 (NEEDLE) ×1 IMPLANT
NEEDLE HYPO 18GX1.5 SHARP (NEEDLE) ×1
PACK LOWER EXTREMITY WL (CUSTOM PROCEDURE TRAY) ×2 IMPLANT
PACK ORTHO EXTREMITY (CUSTOM PROCEDURE TRAY) ×2 IMPLANT
POSITIONER SURGICAL ARM (MISCELLANEOUS) ×2 IMPLANT
SET HNDPC FAN SPRY TIP SCT (DISPOSABLE) ×1 IMPLANT
SET PAD KNEE POSITIONER (MISCELLANEOUS) ×2 IMPLANT
SUT MNCRL AB 4-0 PS2 18 (SUTURE) ×2 IMPLANT
SUT VIC AB 1 CT1 27 (SUTURE) ×1
SUT VIC AB 1 CT1 27XBRD ANTBC (SUTURE) ×1 IMPLANT
SUT VIC AB 2-0 CT1 27 (SUTURE) ×2
SUT VIC AB 2-0 CT1 TAPERPNT 27 (SUTURE) ×2 IMPLANT
SUT VLOC 180 0 24IN GS25 (SUTURE) ×2 IMPLANT
SYR 50ML LL SCALE MARK (SYRINGE) ×2 IMPLANT
TOWEL OR 17X26 10 PK STRL BLUE (TOWEL DISPOSABLE) ×2 IMPLANT
TRAY FOLEY CATH 14FRSI W/METER (CATHETERS) ×2 IMPLANT
WRAP KNEE MAXI GEL POST OP (GAUZE/BANDAGES/DRESSINGS) ×2 IMPLANT
YANKAUER SUCT BULB TIP 10FT TU (MISCELLANEOUS) ×2 IMPLANT

## 2014-06-03 NOTE — Interval H&P Note (Signed)
History and Physical Interval Note:  06/03/2014 7:08 AM  [name]  has presented today for surgery, with the diagnosis of PAINFUL RIGHT KNEE SCARRING AFTER TOTAL KNEE REPLACEMENT  The various methods of treatment have been discussed with the patient and family. After consideration of risks, benefits and other options for treatment, the patient has consented to  Procedure(s): RIGHT KNEE SCAR EXCISION (Right) as a surgical intervention .  The patient's history has been reviewed, patient examined, no change in status, stable for surgery.  I have reviewed the patient's chart and labs.  Questions were answered to the patient's satisfaction.     [name]

## 2014-06-03 NOTE — Brief Op Note (Signed)
06/03/2014  9:31 AM  PATIENT:  [name]  73 y.o. female  PRE-OPERATIVE DIAGNOSIS:  PAINFUL RIGHT KNEE SCARRING AFTER TOTAL KNEE REPLACEMENT  POST-OPERATIVE DIAGNOSIS:  PAINFUL RIGHT KNEE SCARRING AFTER TOTAL KNEE REPLACEMENT  PROCEDURE:  Procedure(s): RIGHT KNEE SCAR EXCISION (Right)  SURGEON:  Surgeon(s) and Role:    * [name], MD - Primary  PHYSICIAN ASSISTANT: Norman Herrlich  ANESTHESIA:   spinal  EBL:  Total I/O In: 1100 [I.V.:1100] Out: 125 [Urine:125]  BLOOD ADMINISTERED:none  DRAINS: none   LOCAL MEDICATIONS USED:  MARCAINE     SPECIMEN:  No Specimen  DISPOSITION OF SPECIMEN:  N/A  COUNTS:  YES  TOURNIQUET:   Total Tourniquet Time Documented: Thigh (Right) - 9 minutes Total: Thigh (Right) - 9 minutes   DICTATION: .Other Dictation: Dictation Number [id number]  PLAN OF CARE: Admit for overnight observation  PATIENT DISPOSITION:  PACU - hemodynamically stable.   Delay start of Pharmacological VTE agent (>24hrs) due to surgical blood loss or risk of bleeding: no

## 2014-06-03 NOTE — Transfer of Care (Signed)
Immediate Anesthesia Transfer of Care Note  Patient: [name]  Procedure(s) Performed: Procedure(s): RIGHT KNEE SCAR EXCISION (Right)  Patient Location: PACU  Anesthesia Type:Spinal  Level of Consciousness: awake, alert , oriented and patient cooperative  Airway & Oxygen Therapy: Patient Spontanous Breathing and Patient connected to face mask oxygen  Post-op Assessment: Report given to PACU RN, Post -op Vital signs reviewed and stable and Patient moving all extremities  Post vital signs: Reviewed and stable  Complications: No apparent anesthesia complications

## 2014-06-03 NOTE — Anesthesia Procedure Notes (Addendum)
Anesthesia Regional Block:   Narrative:    Spinal  Patient location during procedure: OR Start time: 06/03/2014 7:38 AM Staffing CRNA/Resident: Dion Saucier E Performed by: resident/CRNA  Preanesthetic Checklist Completed: patient identified, site marked, surgical consent, pre-op evaluation, timeout performed, IV checked, risks and benefits discussed and monitors and equipment checked Spinal Block Patient position: sitting Prep: Betadine Patient monitoring: continuous pulse ox, heart rate and blood pressure Approach: right paramedian Location: L2-3 Injection technique: single-shot Needle Needle type: Sprotte  Needle gauge: 22 G Needle length: 9 cm Additional Notes Kit expiration date checked, one attempt midline, second paramedian.  Negative heme, paresthesias, clear csf, tolerated well.

## 2014-06-03 NOTE — Evaluation (Signed)
Physical Therapy Evaluation Patient Details Name: [name] MRN: [id number] DOB: 1940/09/02 Today's Date: 06/03/2014   History of Present Illness   Pt is a 73 year old female s/p R knee scar excision due to painful R knee scarring after TKA.  Clinical Impression  Patient is s/p R knee scar excision surgery resulting in functional limitations due to the deficits listed below (see PT Problem List).  Patient will benefit from skilled PT to increase their independence and safety with mobility to allow discharge to the venue listed below.  Pt mobilizing very well POD #0 and plans to d/c home tomorrow.  Pt agreeable to practice steps and review exercises prior to d/c in the morning as she does not want HHPT and states she will be compliant with exercises.      Follow Up Recommendations No PT follow up    Equipment Recommendations  None recommended by PT    Recommendations for Other Services       Precautions / Restrictions Precautions Precautions: Knee Restrictions Other Position/Activity Restrictions: WBAT      Mobility  Bed Mobility Overal bed mobility: Needs Assistance Bed Mobility: Supine to Sit     Supine to sit: Supervision     General bed mobility comments: supervision for lines  Transfers Overall transfer level: Needs assistance Equipment used: Rolling walker (2 wheeled) Transfers: Sit to/from Stand Sit to Stand: Min guard         General transfer comment: verbal cues for UE and LE positioning  Ambulation/Gait Ambulation/Gait assistance: Min guard Ambulation Distance (Feet): 120 Feet Assistive device: Rolling walker (2 wheeled) Gait Pattern/deviations: Step-through pattern;Step-to pattern;Antalgic     General Gait Details: verbal cues for technique, RW positioning, able to progress to step through pattern  Stairs            Wheelchair Mobility    Modified Rankin (Stroke Patients Only)       Balance                                              Pertinent Vitals/Pain Pain Assessment: 0-10 Pain Score: 3  Pain Location: R knee Pain Descriptors / Indicators: Sore Pain Intervention(s): Limited activity within patient's tolerance;Monitored during session;Premedicated before session;Repositioned;Ice applied    Home Living Family/patient expects to be discharged to:: Private residence Living Arrangements: Spouse/significant other Available Help at Discharge: Family Type of Home: House Home Access: Stairs to enter Entrance Stairs-Rails: Right Entrance Stairs-Number of Steps: 3-4 Home Layout: One level Home Equipment: Environmental consultant - 2 wheels;Bedside commode      Prior Function Level of Independence: Independent         Comments: reports difficulty sittting and standing as well as performing steps prior to surgery due to pain and limited knee bending functionally     Hand Dominance        Extremity/Trunk Assessment               Lower Extremity Assessment: RLE deficits/detail RLE Deficits / Details: able to perform SLR, R knee flexion observed 110* AAROM supine       Communication   Communication: No difficulties  Cognition Arousal/Alertness: Awake/alert Behavior During Therapy: WFL for tasks assessed/performed Overall Cognitive Status: Within Functional Limits for tasks assessed                      General  Comments      Exercises        Assessment/Plan    PT Assessment Patient needs continued PT services  PT Diagnosis Difficulty walking   PT Problem List Decreased strength;Decreased range of motion;Decreased mobility;Pain  PT Treatment Interventions Functional mobility training;DME instruction;Gait training;Stair training;Therapeutic exercise;Therapeutic activities;Patient/family education   PT Goals (Current goals can be found in the Care Plan section) Acute Rehab PT Goals Patient Stated Goal: home tomorrow PT Goal Formulation: With patient Time For Goal  Achievement: 06/06/14 Potential to Achieve Goals: Good    Frequency 7X/week   Barriers to discharge        Co-evaluation               End of Session   Activity Tolerance: Patient tolerated treatment well Patient left: in chair;with call bell/phone within reach      Functional Assessment Tool Used: clinical judgement Functional Limitation: Mobility: Walking and moving around Mobility: Walking and Moving Around Current Status (S5053): At least 1 percent but less than 20 percent impaired, limited or restricted Mobility: Walking and Moving Around Goal Status [phone]): 0 percent impaired, limited or restricted    Time: 1536-1550 PT Time Calculation (min): 14 min   Charges:   PT Evaluation $Initial PT Evaluation Tier I: 1 Procedure PT Treatments $Gait Training: 8-22 mins   PT G Codes:   Functional Assessment Tool Used: clinical judgement Functional Limitation: Mobility: Walking and moving around    Eustis E 06/03/2014, 4:51 PM Carmelia Bake, PT, DPT 06/03/2014 Pager: [phone]

## 2014-06-03 NOTE — Anesthesia Postprocedure Evaluation (Signed)
  Anesthesia Post-op Note  Patient: [name]  Procedure(s) Performed: Procedure(s) (LRB): RIGHT KNEE SCAR EXCISION (Right)  Patient Location: PACU  Anesthesia Type: Spinal  Level of Consciousness: awake and alert   Airway and Oxygen Therapy: Patient Spontanous Breathing  Post-op Pain: mild  Post-op Assessment: Post-op Vital signs reviewed, Patient's Cardiovascular Status Stable, Respiratory Function Stable, Patent Airway and No signs of Nausea or vomiting  Last Vitals:  Filed Vitals:   06/03/14 1052  BP: 129/86  Pulse: 69  Temp: 36.7 C  Resp: 12    Post-op Vital Signs: stable   Complications: No apparent anesthesia complications

## 2014-06-04 ENCOUNTER — Encounter (HOSPITAL_COMMUNITY): Payer: Self-pay | Admitting: Orthopedic Surgery

## 2014-06-04 DIAGNOSIS — L910 Hypertrophic scar: Secondary | ICD-10-CM | POA: Diagnosis not present

## 2014-06-04 LAB — BASIC METABOLIC PANEL
Anion gap: 11 (ref 5–15)
BUN: 10 mg/dL (ref 6–23)
CHLORIDE: 97 meq/L (ref 96–112)
CO2: 25 mEq/L (ref 19–32)
Calcium: 8.6 mg/dL (ref 8.4–10.5)
Creatinine, Ser: 0.74 mg/dL (ref 0.50–1.10)
GFR calc Af Amer: >90 mL/min (ref >90)
GFR calc non Af Amer: 84 mL/min — ABNORMAL LOW (ref >90)
GLUCOSE: 122 mg/dL — AB (ref 70–99)
POTASSIUM: 3.8 meq/L (ref 3.7–5.3)
Sodium: 133 mEq/L — ABNORMAL LOW (ref 137–147)

## 2014-06-04 LAB — CBC
HEMATOCRIT: 36.9 % (ref 36.0–46.0)
HEMOGLOBIN: 12.3 g/dL (ref 12.0–15.0)
MCH: 31.8 pg (ref 26.0–34.0)
MCHC: 33.3 g/dL (ref 30.0–36.0)
MCV: 95.3 fL (ref 78.0–100.0)
Platelets: 176 10*3/uL (ref 150–400)
RBC: 3.87 MIL/uL (ref 3.87–5.11)
RDW: 12 % (ref 11.5–15.5)
WBC: 10.9 10*3/uL — AB (ref 4.0–10.5)

## 2014-06-04 MED ORDER — POLYETHYLENE GLYCOL 3350 17 G PO PACK: 17.0000 g | PACK | Freq: Two times a day (BID) | ORAL | Status: DC

## 2014-06-04 MED ORDER — HYDROCODONE-ACETAMINOPHEN 7.5-325 MG PO TABS: 1.0000 | ORAL_TABLET | ORAL | Status: DC | PRN

## 2014-06-04 MED ORDER — TIZANIDINE HCL 4 MG PO TABS: 4.0000 mg | ORAL_TABLET | Freq: Four times a day (QID) | ORAL | Status: DC | PRN

## 2014-06-04 MED ORDER — ASPIRIN 325 MG PO TBEC: 325.0000 mg | DELAYED_RELEASE_TABLET | Freq: Two times a day (BID) | ORAL | Status: AC

## 2014-06-04 MED ORDER — DSS 100 MG PO CAPS: 100.0000 mg | ORAL_CAPSULE | Freq: Two times a day (BID) | ORAL | Status: DC

## 2014-06-04 MED ORDER — FERROUS SULFATE 325 (65 FE) MG PO TABS: 325.0000 mg | ORAL_TABLET | Freq: Three times a day (TID) | ORAL | Status: DC

## 2014-06-04 NOTE — Progress Notes (Signed)
Physical Therapy Treatment Patient Details Name: [name] MRN: [id number] DOB: 08-13-1940 Today's Date: 06/04/2014    History of Present Illness  Pt is a 73 year old female s/p R knee scar excision due to painful R knee scarring after TKA.    PT Comments    POD # 1 am session pt ready to D/C to home.  Amb in hallway, practiced stairs and performed TE's following handout.  Follow Up Recommendations  No PT follow up     Equipment Recommendations  None recommended by PT    Recommendations for Other Services       Precautions / Restrictions Precautions Precautions: Knee Restrictions Weight Bearing Restrictions: No Other Position/Activity Restrictions: WBAT    Mobility  Bed Mobility Overal bed mobility: Modified Independent                Transfers Overall transfer level: Modified independent                  Ambulation/Gait Ambulation/Gait assistance: Modified independent (Device/Increase time) Ambulation Distance (Feet): 500 Feet Assistive device: Rolling walker (2 wheeled)           Stairs Stairs: Yes Stairs assistance: Min guard Stair Management: One rail Left;Step to pattern;Forwards Number of Stairs: 4 General stair comments: one initial VC on proper sequencing  Wheelchair Mobility    Modified Rankin (Stroke Patients Only)       Balance                                    Cognition                            Exercises    Knee  TE's 10 reps B LE ankle pumps 10 reps towel squeezes 10 reps knee presses 10 reps heel slides  10 reps SAQ's 10 reps SLR's 10 reps ABD Followed by ICE     General Comments        Pertinent Vitals/Pain  c/o 2/10 knee pain    Home Living                      Prior Function            PT Goals (current goals can now be found in the care plan section) Progress towards PT goals: Progressing toward goals    Frequency  7X/week    PT Plan       Co-evaluation             End of Session Equipment Utilized During Treatment: Gait belt Activity Tolerance: Patient tolerated treatment well Patient left: in chair;with call bell/phone within reach     Time: 0912-0930 PT Time Calculation (min): 18 min  Charges:  $Gait Training: 8-22 mins                    G Codes:      Rica Koyanagi  PTA [facility]  Acute  Rehab Pager      [phone]

## 2014-06-04 NOTE — Care Management Note (Signed)
    Page 1 of 1   06/04/2014     8:49:13 AM CARE MANAGEMENT NOTE 06/04/2014  Patient:  [name], [name]   Account Number:  [id number]  Date Initiated:  06/04/2014  Documentation initiated by:  [employer] [facility] [employer]  Subjective/Objective Assessment:   adm: right total knee arthroplasty     Action/Plan:   discharge planning   Anticipated DC Date:  06/04/2014   Anticipated DC Plan:  [place]  CM consult      Choice offered to / List presented to:          [facility] arranged  [place] - 11 Patient Refused      Status of service:  Completed, signed off Medicare Important Message given?   (If response is "NO", the following Medicare IM given date fields will be blank) Date Medicare IM given:   Medicare IM given by:   Date Additional Medicare IM given:   Additional Medicare IM given by:    Discharge Disposition:  HOME/SELF CARE  Per UR Regulation:    If discussed at Long Length of Stay Meetings, dates discussed:    Comments:  06/04/14 08:45 CM met with pt in room to offer choice and pt refuses any home health services.  Pt states she has husband at home and plenty of support and has been through several similar surgeries and does not need any HHPT.  Pt has cane, 3n1, rolling walker at home and requires not DME. No other CM needs were communicated.  Mariane Masters, BSN, CM [phone].

## 2014-06-04 NOTE — Op Note (Signed)
[name], [name] NO.:  [id number]  MEDICAL RECORD NO.:  [id number]  LOCATION:  29                         FACILITY:  [facility]  PHYSICIAN:  Pietro Cassis. Alvan Dame, M.D.  DATE OF BIRTH:  [birth year]  DATE OF PROCEDURE:  06/03/2014 DATE OF DISCHARGE:                              OPERATIVE REPORT   PREOPERATIVE DIAGNOSIS:  Status post right total knee arthroplasty with painful scar and crepitation about a year out from surgery.  POSTOPERATIVE DIAGNOSIS:  Status post right total knee arthroplasty with painful scar and crepitation about a year out from surgery.  FINDINGS:  The patient was noted to have no signs of any infection or any other complicating features.  She did have hypertrophic scar in the supra and infrapatellar region.  PROCEDURE: 1. Open scar excision, sharply excising scar tissue in the     peripatellar region both proximal, circumferentially around the     patella down to visible tendinous structures of the quadriceps and     patellar tendons. 2. Irrigation of right knee.  SURGEON:  Pietro Cassis. Alvan Dame, M.D.  ASSISTANT:  Danae Orleans, PA-C.  Note that, Mr. [name] was present for the entirety of the case from preoperative position, perioperative management of operative extremity, general facilitation of the case, primary wound closure.  ANESTHESIA:  Spinal.  SPECIMENS:  None.  COMPLICATIONS:  None.  TOURNIQUET TIME:  90 minutes at 250 mmHg.  DRAINS:  None.  INDICATIONS FOR PROCEDURE:  [name] is a 73 year old female, a little over a year out from right total knee arthroplasty.  In the postoperative period, she had retained full knee extension and flexion, but it was reported to me painful crepitation and scar sensation in the knee.  Despite attempts at conservative measures including rest, anti- inflammatory use, and observation, she had persistent problems.  When she was last seen in office, she wished to and elected to have  this excised.  I discussed with her that my indications for doing this open for better visualization and direct debridement down to her tendons. Discussed the postoperative course and expectations with this type of procedure.  Consent was obtained knowing that the risks were infection, DVT, risks of recurrence of scar, and need for further surgeries.  For her risks of infection, I will plan to keep her in the hospital overnight.  Consent was obtained.  PROCEDURE IN DETAIL:  Patient was brought to the operative theater. Once adequate anesthesia, preoperative antibiotics, Ancef administered, patient was positioned in supine.  Right thigh tourniquet was placed. Right lower extremity was then prepped and draped in sterile fashion. Time-out was performed identifying the patient, planned procedure, and extremity.  Leg was exsanguinated, tourniquet elevated to 250 mmHg.  A midline incision was made through old incision.  Soft tissue plane was created, a median arthrotomy was then made encountering small clear synovial fluid.  No signs of any infection.  No other complicating features.  Following initial exposure, I was able to evert the patella revealing a significant hypertrophic scar in the supra and infrapatellar region circumferentially around the patella.  Then using a 10 blade, I sharply excised the scar down to the exposed quadriceps and patella  tendon.  There were no complications with this.  There were no other obvious issues that were going on there.  After irrigating this wound out with 1 L normal saline solution with pulse lavage, we reapproximated the extensor mechanism with the knee flexed about 60 degrees using a combination of #1 Vicryl and 0 V-Loc suture.  The remainder of the wound was closed with 2-0 Vicryl and a running 4-0 Monocryl.  The knee was cleaned, dried, and dressed sterilely with Dermabond and Aquacel dressing.  She was brought to the recovery room in stable  condition tolerating the procedure well.  Findings were reviewed with her husband. I will keep her overnight for antibiotics.     Pietro Cassis Alvan Dame, M.D.     MDO/MEDQ  D:  06/03/2014  T:  06/04/2014  Job:  [id number]

## 2014-06-04 NOTE — Progress Notes (Signed)
     Subjective: 1 Day Post-Op Procedure(s) (LRB): RIGHT KNEE SCAR EXCISION (Right)   Patient reports pain as mild, pain well controlled. She and her husband are very happy with how good she feels with her knee. No events throughout the night.  Ready to be discharged home.  Objective:   VITALS:   Filed Vitals:   06/04/14 0513  BP: 130/74  Pulse: 71  Temp: 98.3 F (36.8 C)  Resp: 16    Dorsiflexion/Plantar flexion intact Incision: dressing C/D/I No cellulitis present Compartment soft  LABS  Recent Labs  06/04/14 0430  HGB 12.3  HCT 36.9  WBC 10.9*  PLT 176     Recent Labs  06/04/14 0430  NA 133*  K 3.8  BUN 10  CREATININE 0.74  GLUCOSE 122*     Assessment/Plan: 1 Day Post-Op Procedure(s) (LRB): RIGHT KNEE SCAR EXCISION (Right) Foley cath d/c'ed Advance diet Up with therapy D/C IV fluids Discharge home with home health Follow up in 2 weeks at [facility]. Follow up with OLIN,Feliciana Narayan D in 2 weeks.  Contact information:  [facility] [address] [place] [name]   PAC  06/04/2014, 11:47 AM

## 2014-06-07 NOTE — Discharge Summary (Signed)
Physician Discharge Summary  Patient ID: [name] MRN: [id number] DOB/AGE: [birth year] 71 y.o.  Admit date: 06/03/2014 Discharge date: 06/04/2014   Procedures:  Procedure(s) (LRB): RIGHT KNEE SCAR EXCISION (Right)  Attending Physician:  Dr. Paralee Cancel   Admission Diagnoses:   Right knee crepitus / pain S/p right TKA  Discharge Diagnoses:  Active Problems:   Scar of knee  Past Medical History  Diagnosis Date  . COPD (chronic obstructive pulmonary disease)   . Depression   . Anxiety   . Gout     last time 6-52yrs ago   . OCD (obsessive compulsive disorder)   . Tinea corporis   . Acute bronchitis   . Allergic rhinitis   . Macrocytosis   . Skin rash   . Hypertension     takes Amlodipine nightly  . Hyperlipidemia     taking Flax Seed Oil and Fish Oil  . Emphysema   . Hx of migraines 90's    after menopause HA stopped  . Arthritis     all over  . Osteopenia     takes Calcium and Vit D bid  . Back pain     RESOLVED AFTE BILATERAL HIP REPLACEMENTS  . Liver cyst   . IBS (irritable bowel syndrome)   . Bilateral cataracts   . Hot flashes     takes Prozac daily  . Arthritis   . Chicken pox   . Seasonal allergies   . Hypertension   . History of blood transfusion   . Pneumonia     x 2 ;last time back in 1999  . Lung cancer   . Anemia   . 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 Smoking 09/12/2011  . Cholelithiasis 05/21/2014    On CT scan 2015   . PONV (postoperative nausea and vomiting)   . Migraine     MIGRAINES RESOLVED WITH MENOPAUSE    HPI: Pt is a 73 y.o. female complaining of right knee pain and crepitus. Pain had continually increased since the beginning. X-rays in the clinic show previous TKA of the right knee. She has full active knee extension; however on the right knee, there is more painful crepitation with active motion. At this point with more significant crepitation related to excessive scarring in the  right knee. I discussed with her that we could continue to observe this conservatively or she could consider having an open scar debridement based on the way that she feels if this right knee is bothering her. She feels that this may be a necessity. We would plan on an overnight hospital stay with antibiotics. Pt has tried various conservative treatments which have failed to alleviate their symptoms. Various options are discussed with the patient. Risks, benefits and expectations were discussed with the patient. Patient understand the risks, benefits and expectations and wishes to proceed with surgery.   PCP: [name]., DO   Discharged Condition: good  Hospital Course:  Patient underwent the above stated procedure on 06/03/2014. Patient tolerated the procedure well and brought to the recovery room in good condition and subsequently to the floor.  POD #1 BP: 130/74 ; Pulse: 71 ; Temp: 98.3 F (36.8 C) ; Resp: 16  Patient reports pain as mild, pain well controlled. She and her husband are very happy with how good she feels with her knee. No events throughout the night. Ready to be discharged home. Dorsiflexion/plantar flexion intact, incision: dressing  C/D/I, no cellulitis present and compartment soft.   LABS  Basename    HGB  12.3  HCT  36.9    Discharge Exam: General appearance: alert, cooperative and no distress Extremities: Homans sign is negative, no sign of DVT, no edema, redness or tenderness in the calves or thighs and no ulcers, gangrene or trophic changes  Disposition: Home with follow up in 2 weeks   Follow-up Information   Follow up with [name], MD. Schedule an appointment as soon as possible for a visit in 2 weeks.   Specialty:  Orthopedic Surgery   Contact information:   [address] [place] [address] [phone]       Discharge Instructions   Call MD / Call 911    Complete by:  As directed   If you experience chest pain or  shortness of breath, CALL 911 and be transported to the hospital emergency room.  If you develope a fever above 101 F, pus (white drainage) or increased drainage or redness at the wound, or calf pain, call your surgeon's office.     Change dressing    Complete by:  As directed   Maintain surgical dressing for 10-14 days, or until follow up in the clinic.     Constipation Prevention    Complete by:  As directed   Drink plenty of fluids.  Prune juice may be helpful.  You may use a stool softener, such as Colace (over the counter) 100 mg twice a day.  Use MiraLax (over the counter) for constipation as needed.     Diet - low sodium heart healthy    Complete by:  As directed      Discharge instructions    Complete by:  As directed   Maintain surgical dressing for 10-14 days, or until follow up in the clinic. Follow up in 2 weeks at [facility]. Call with any questions or concerns.     Driving restrictions    Complete by:  As directed   No driving for 4 weeks     Increase activity slowly as tolerated    Complete by:  As directed      TED hose    Complete by:  As directed   Use stockings (TED hose) for 2 weeks on both leg(s).  You may remove them at night for sleeping.     Weight bearing as tolerated    Complete by:  As directed   Laterality:  right  Extremity:  Lower             Medication List    STOP taking these medications       acetaminophen 500 MG tablet  Commonly known as:  TYLENOL     ADVIL 200 MG Caps  Generic drug:  Ibuprofen      TAKE these medications       albuterol 108 (90 BASE) MCG/ACT inhaler  Commonly known as:  PROAIR HFA  Inhale 2 puffs into the lungs every 6 (six) hours as needed for wheezing or shortness of breath.     amLODipine 5 MG tablet  Commonly known as:  NORVASC  Take 5 mg by mouth at bedtime.     aspirin 325 MG EC tablet  Take 1 tablet (325 mg total) by mouth 2 (two) times daily.     budesonide-formoterol 80-4.5 MCG/ACT inhaler   Commonly known as:  SYMBICORT  Inhale 2 puffs into the lungs 2 (two) times daily.  DSS 100 MG Caps  Take 100 mg by mouth 2 (two) times daily.     ferrous sulfate 325 (65 FE) MG tablet  Take 1 tablet (325 mg total) by mouth 3 (three) times daily after meals.     FLUoxetine 20 MG tablet  Commonly known as:  PROZAC  Take 20 mg by mouth 2 (two) times daily.     HYDROcodone-acetaminophen 7.5-325 MG per tablet  Commonly known as:  NORCO  Take 1-2 tablets by mouth every 4 (four) hours as needed for moderate pain.     loratadine 10 MG tablet  Commonly known as:  CLARITIN  Take 10 mg by mouth daily as needed for allergies.     multivitamin tablet  Take 1 tablet by mouth every morning.     polyethylene glycol packet  Commonly known as:  MIRALAX / GLYCOLAX  Take 17 g by mouth 2 (two) times daily.     sodium chloride 0.65 % Soln nasal spray  Commonly known as:  OCEAN  Place 1 spray into both nostrils daily as needed for congestion.     tiZANidine 4 MG tablet  Commonly known as:  ZANAFLEX  Take 1 tablet (4 mg total) by mouth every 6 (six) hours as needed.     TURMERIC PO  Take 1 tablet by mouth at bedtime.     VITAMIN D-3 PO  Take 1,200 mg by mouth every morning.     vitamin E 400 UNIT capsule  Take 400 Units by mouth every morning.         Signed: [place]. Abbie Jablon   PA-C  06/07/2014, 10:10 AM

## 2014-07-01 ENCOUNTER — Other Ambulatory Visit: Payer: Self-pay | Admitting: Family Medicine

## 2014-07-01 DIAGNOSIS — Z1231 Encounter for screening mammogram for malignant neoplasm of breast: Secondary | ICD-10-CM

## 2014-07-01 NOTE — Telephone Encounter (Signed)
Dr Maudie Mercury is this OK to refill?  According to the chart, you never prescribed this for the pt.

## 2014-07-02 NOTE — Telephone Encounter (Signed)
Ok to refill for 3 months but ensure has follow up in next 3 months.

## 2014-07-16 ENCOUNTER — Ambulatory Visit (HOSPITAL_COMMUNITY)
Admission: RE | Admit: 2014-07-16 | Discharge: 2014-07-16 | Disposition: A | Discharge status: Home / Self Care | Payer: Medicare Other | Source: Ambulatory Visit | Attending: Family Medicine | Admitting: Family Medicine

## 2014-07-16 DIAGNOSIS — Z1231 Encounter for screening mammogram for malignant neoplasm of breast: Secondary | ICD-10-CM | POA: Insufficient documentation

## 2014-07-22 ENCOUNTER — Other Ambulatory Visit: Payer: Self-pay | Admitting: Family Medicine

## 2014-08-13 DIAGNOSIS — S6391XA Sprain of unspecified part of right wrist and hand, initial encounter: Secondary | ICD-10-CM | POA: Diagnosis not present

## 2014-08-13 DIAGNOSIS — M79641 Pain in right hand: Secondary | ICD-10-CM | POA: Diagnosis not present

## 2014-09-03 DIAGNOSIS — H25812 Combined forms of age-related cataract, left eye: Secondary | ICD-10-CM | POA: Diagnosis not present

## 2014-09-03 DIAGNOSIS — H2512 Age-related nuclear cataract, left eye: Secondary | ICD-10-CM | POA: Diagnosis not present

## 2014-09-12 DIAGNOSIS — H34833 Tributary (branch) retinal vein occlusion, bilateral: Secondary | ICD-10-CM | POA: Diagnosis not present

## 2014-09-19 ENCOUNTER — Encounter: Payer: Medicare Other | Admitting: Family Medicine

## 2014-09-20 ENCOUNTER — Ambulatory Visit (INDEPENDENT_AMBULATORY_CARE_PROVIDER_SITE_OTHER): Payer: Medicare Other | Admitting: Family Medicine

## 2014-09-20 ENCOUNTER — Encounter: Payer: Self-pay | Admitting: Family Medicine

## 2014-09-20 VITALS — BP 120/82 | HR 76 | Temp 97.9°F | Ht 62.0 in | Wt 144.7 lb

## 2014-09-20 DIAGNOSIS — J449 Chronic obstructive pulmonary disease, unspecified: Secondary | ICD-10-CM | POA: Diagnosis not present

## 2014-09-20 DIAGNOSIS — F329 Major depressive disorder, single episode, unspecified: Secondary | ICD-10-CM

## 2014-09-20 DIAGNOSIS — F32A Depression, unspecified: Secondary | ICD-10-CM

## 2014-09-20 DIAGNOSIS — Z0000 Encounter for general adult medical examination without abnormal findings: Secondary | ICD-10-CM | POA: Diagnosis not present

## 2014-09-20 DIAGNOSIS — I159 Secondary hypertension, unspecified: Secondary | ICD-10-CM

## 2014-09-20 MED ORDER — FLUOXETINE HCL 20 MG PO CAPS: 20.0000 mg | ORAL_CAPSULE | Freq: Two times a day (BID) | ORAL | Status: DC

## 2014-09-20 MED ORDER — AMLODIPINE BESYLATE 5 MG PO TABS: 5.0000 mg | ORAL_TABLET | Freq: Every day | ORAL | Status: DC

## 2014-09-20 NOTE — Patient Instructions (Signed)
We recommend the following healthy lifestyle measures: - eat a healthy diet consisting of lots of vegetables, fruits, beans, nuts, seeds, healthy meats such as white chicken and fish and whole grains.  - avoid fried foods, fast food, processed foods, sodas, red meet and other fattening foods.  - get a least 150 minutes of aerobic exercise per week.   Follow up in 1 year and as needed

## 2014-09-20 NOTE — Progress Notes (Signed)
Pre visit review using our clinic review tool, if applicable. No additional management support is needed unless otherwise documented below in the visit note. 

## 2014-09-20 NOTE — Progress Notes (Signed)
Medicare Annual Preventive Care Visit  (initial annual wellness or annual wellness exam)  [name] is a pleasant 74 yo with PMH lung ca, COPD (sees pulm and onc), HTN, HLD, depression and sig OA (sees ortho) here for her medicare wellness visit.  Concerns and/or follow up today:  HTN: -meds: amlodipine 5 -stable -denies: CP, SOB, DOE  Depression and anxiety and hot flashes: -meds: prozac -reports: doing well, wants to continue -denies: SI, depression  ROS: negative for report of fevers, unintentional weight loss, vision changes, vision loss, hearing loss or change, chest pain, sob, hemoptysis, melena, hematochezia, hematuria, genital discharge or lesions, falls, bleeding or bruising, loc, thoughts of suicide or self harm, memory loss  1.) Patient-completed health risk assessment  - completed and reviewed, see scanned documentation  2.) Review of Medical History: -PMH, PSH, Family History and current specialty and care providers reviewed and updated and listed below  - see scanned in document in chart and below  Past Medical History  Diagnosis Date  . COPD (chronic obstructive pulmonary disease)   . Depression     hx ocd and anxiety in record as well  . Gout     last time 6-42yrs ago   . Allergic rhinitis   . Macrocytosis   . Hypertension     takes Amlodipine nightly  . Hyperlipidemia     taking Flax Seed Oil and Fish Oil  . Hx of migraines 90's    after menopause HA stopped  . Arthritis     all over  . Osteopenia     takes Calcium and Vit D bid  . Liver cyst   . IBS (irritable bowel syndrome)   . Bilateral cataracts   . Hot flashes     takes Prozac daily  . Chicken pox   . Seasonal allergies   . History of blood transfusion   . Pneumonia     x 2 ;last time back in 1999  . Lung cancer   . Anemia   . 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 Smoking 09/12/2011  . PONV (postoperative nausea and vomiting)   . Migraine      MIGRAINES RESOLVED WITH MENOPAUSE  . Cholelithiasis 05/21/2014    On CT scan 2015     Past Surgical History  Procedure Laterality Date  . Inguinal hernia repair  2000  . Neuroplasty decompression median nerve at carpal tunnel    . Rotator cuff repair  2001    right  . Tonsillectomy  as a child    and adenoids  . Dilation and curettage of uterus      at age 66  . Cyst removed  >2yrs ago    from left leg  . Carpal tunnel release  80's    right   . Total abdominal hysterectomy  2003  . Total hip arthroplasty  2011    left  . Colonoscopy    . Video bronchoscopy  10/20/2011    Procedure: VIDEO BRONCHOSCOPY;  Surgeon: Grace Isaac, MD;  Location: [facility] OR;  Service: Thoracic;  Laterality: N/A;  . Wedge resection  10-20-2011    rt lung  - for lung cancer  . Total hip arthroplasty  03/07/2012    Procedure: TOTAL HIP ARTHROPLASTY ANTERIOR APPROACH;  Surgeon: [name], MD;  Location: [facility] ORS;  Service: Orthopedics;  Laterality: Right;  . Total knee arthroplasty Right 01/02/2013    Procedure: RIGHT  TOTAL KNEE ARTHROPLASTY;  Surgeon: [name], MD;  Location: [facility];  Service: Orthopedics;  Laterality: Right;  . Total knee arthroplasty Left 04/03/2013    Procedure: LEFT TOTAL KNEE ARTHROPLASTY;  Surgeon: [name], MD;  Location: [facility] OR[facility];  Service: Orthopedics;  Laterality: Left;  . Excisional total knee arthroplasty Right 06/03/2014    Procedure: RIGHT KNEE SCAR EXCISION;  Surgeon: [name], MD;  Location: [facility];  Service: Orthopedics;  Laterality: Right;    History   Social History  . Marital Status: Married    Spouse Name: N/A  . Number of Children: N/A  . Years of Education: N/A   Occupational History  . retired    Social History Main Topics  . Smoking status: Former Smoker -- 0.50 packs/day for 50 years    Types: Cigarettes    Quit date: 09/23/2011  . Smokeless tobacco: Never Used  . Alcohol Use: 0.0 oz/week     Comment: 2 beers every night  . Drug  Use: No  . Sexual Activity: No   Other Topics Concern  . Not on file   Social History Narrative    The patient has a family history of  3.) Review of functional ability and level of safety:  Any difficulty hearing?  NO  History of falling? NO  Any trouble with IADLs - using a phone, using transportation, grocery shopping, preparing meals, doing housework, doing laundry, taking medications and managing money? NO  Advance Directives? YES  See summary of recommendations in Patient Instructions below.  4.) Physical Exam Filed Vitals:   09/20/14 0931  BP: 120/82  Pulse: 76  Temp: 97.9 F (36.6 C)   Estimated body mass index is 26.46 kg/(m^2) as calculated from the following:   Height as of this encounter: 5\' 2"  (1.575 m).   Weight as of this encounter: 144 lb 11.2 oz (65.635 kg).  EKG (optional): deferred  General: alert, appear well hydrated and in no acute distress  HEENT: visual acuity grossly intact - recently had cataract surgery  CV: HRRR  Lungs: CTA bilaterally  Psych: pleasant and cooperative, no obvious depression or anxiety  Mini Cog:  Patient Score: NEG    Patch of dry erythematous skin on L lower ext - reports saw derm for this and eczema  See patient instructions for recommendations.  Education and counseling regarding the above review of health provided with a plan for the following: -see scanned patient completed form for further details -fall prevention strategies discussed  -healthy lifestyle discussed -importance and resources for completing advanced directives discussed -see patient instructions below for any other recommendations provided  4)The following written screening schedule of preventive measures were reviewed with assessment and plan made per below, orders and patient instructions:      AAA screening n/a     Alcohol screening done     Obesity Screening and counseling done     STI screening n/a     Tobacco Screening  done       Pneumococcal (PPSV23 -one dose after 64, one before if risk factors), influenza yearly and hepatitis B vaccines (if high risk - end stage renal disease, IV drugs, homosexual men, live in home for mentally retarded, hemophilia receiving factors) ASSESSMENT/PLAN: done      Screening mammograph (yearly if >40) ASSESSMENT/PLAN: 07/2014      Screening Pap smear/pelvic exam (q2 years) ASSESSMENT/PLAN: n/a      Prostate cancer screening ASSESSMENT/PLAN: n/a      Colorectal  cancer screening (FOBT yearly or flex sig q4y or colonoscopy q10y or barium enema q4y) ASSESSMENT/PLAN: UTD      Diabetes outpatient self-management training services ASSESSMENT/PLAN: n/a      Bone mass measurements(covered q2y if indicated - estrogen def, osteoporosis, hyperparathyroid, vertebral abnormalities, osteoporosis or steroids) ASSESSMENT/PLAN: done, osteopenia, doing vit d and weight bearing exercise      Screening for glaucoma(q1y if high risk - diabetes, FH, AA and > 50 or hispanic and > 65) ASSESSMENT/PLAN: sees optho      Medical nutritional therapy for individuals with diabetes or renal disease ASSESSMENT/PLAN: n/a      Cardiovascular screening blood tests (lipids q5y) ASSESSMENT/PLAN: done in 2013, not fasting today      Diabetes screening tests ASSESSMENT/PLAN: done 05/2014   7.) Summary: -risk factors and conditions per above assessment were discussed and treatment, recommendations and referrals were offered per documentation above and orders and patient instructions.  Medicare annual wellness visit, subsequent  Moderate COPD (chronic obstructive pulmonary disease), Sees Dr. Chase Caller, Pulm  Depression  Secondary hypertension, unspecified  Patient Instructions  We recommend the following healthy lifestyle measures: - eat a healthy diet consisting of lots of vegetables, fruits, beans, nuts, seeds, healthy meats such as white chicken and fish and whole grains.  - avoid fried  foods, fast food, processed foods, sodas, red meet and other fattening foods.  - get a least 150 minutes of aerobic exercise per week.   Follow up in 1 year and as needed

## 2014-09-23 ENCOUNTER — Telehealth: Payer: Self-pay | Admitting: Family Medicine

## 2014-09-23 NOTE — Telephone Encounter (Signed)
emmi emailed °

## 2014-10-22 DIAGNOSIS — H2511 Age-related nuclear cataract, right eye: Secondary | ICD-10-CM | POA: Diagnosis not present

## 2014-10-22 DIAGNOSIS — H25811 Combined forms of age-related cataract, right eye: Secondary | ICD-10-CM | POA: Diagnosis not present

## 2014-11-20 DIAGNOSIS — Z961 Presence of intraocular lens: Secondary | ICD-10-CM | POA: Diagnosis not present

## 2014-11-22 ENCOUNTER — Ambulatory Visit (INDEPENDENT_AMBULATORY_CARE_PROVIDER_SITE_OTHER): Payer: Medicare Other | Admitting: Internal Medicine

## 2014-11-22 ENCOUNTER — Encounter: Payer: Self-pay | Admitting: Internal Medicine

## 2014-11-22 VITALS — BP 110/80 | HR 80 | Ht 62.0 in | Wt 144.6 lb

## 2014-11-22 DIAGNOSIS — J449 Chronic obstructive pulmonary disease, unspecified: Secondary | ICD-10-CM | POA: Diagnosis not present

## 2014-11-22 DIAGNOSIS — J329 Chronic sinusitis, unspecified: Secondary | ICD-10-CM

## 2014-11-22 MED ORDER — TIOTROPIUM BROMIDE MONOHYDRATE 2.5 MCG/ACT IN AERS: 2.0000 | INHALATION_SPRAY | Freq: Every day | RESPIRATORY_TRACT | Status: DC

## 2014-11-22 MED ORDER — MOMETASONE FURO-FORMOTEROL FUM 100-5 MCG/ACT IN AERO: 2.0000 | INHALATION_SPRAY | Freq: Two times a day (BID) | RESPIRATORY_TRACT | Status: DC

## 2014-11-22 MED ORDER — FLUTICASONE PROPIONATE 50 MCG/ACT NA SUSP: 2.0000 | Freq: Every day | NASAL | Status: DC

## 2014-11-22 NOTE — Patient Instructions (Addendum)
#  chronic rhinosinusitis - - continue nasal steroids; - TAKE NEW script for generic fluticasone inhaler 2 squirts each nostril daily   - encourage daily netti pott as well  #COPD  Gold stge 2  - stable.  - change symbicort to dulera 100/4.5 2 puff twice daily -start spiriva respimat 1 puff daily - refer pulmonary rehab  #lung cancer surveillance  - through Dr Julien Nordmann; last CT Oct 2015 without cancer recurrence  #followup  - 6 months with CAT score - depending on progress might wean down therapy

## 2014-11-22 NOTE — Progress Notes (Signed)
Subjective:    Patient ID: [name], female    DOB: Dec 28, 1940, 74 y.o.   MRN: [id number]  HPI  PMD is Dr Arsenio Loader.   #Known spring allergies,   #IBS, OA - knees and hip and does regular water exercises.   #Drinks 2 coors lite each night   -CAGE -negative early 2013.    #Ex-Smoker  - 25 pack-year smoking history. Quit 2013 after diagnosis of lung cancer.  #COPD-MM genotype - 2013: Pre RUL lobectomy and Pre Rx: fev1 1.1L/60% , DLCO 9.6/56%, TLC 5.6/128%  - 2013 May: Post lobectomy and post Rx: fev1: 1.28L/60%, ratio 49 . No desat walk test - Rx spiriva, symbicort since spring 2013 - 2014 Nov: 06/22/2013. FEV1 is 1.3 L/62%. No post bronchodilator change. FVC is 2.6 L postop day later which is 95%. Ratio is 50. Total lung capacity is 4.96L/104%. DLCO is level 0.7/54%. - On synmbicort alone 04/05/2014 - Walk test today 185 feet x 3 laps on RA: Desaturrated to < 88% at 2 laps - Change to [employer] plus Spiriva Respimat April 2016  # STage 1 lung cancer: pT1b, pN0 Stage 08 October 2011  - s/p lobectomy by Dr Servando Snare  - surveillance by Dr Earlie Server  - 05/15/13 - Clear CT. No recurrence  - October 2015 - clear CT,No recurrence       OV 11/22/2014  Chief Complaint  Patient presents with  . Follow-up    Pt here for 9 month f/u. Pt c/o increase in fatigue and increase in SOB. Pt c/o seasonal allergies with sneezing, coughing, watery eyes. Pt denies CP/tightness.    9 month follow-up.   - chronic rhinosinusitis: still a problem. Moderate severity. She feels it will never go away. She does not want tests or referrals. Netti pot and nasal steroids help. This time she is compliant with nasal steroids. She is asking for a prescription so she can save on cost   - lung cancer: October 2015 CT scan done by oncology is without any recurrence   - COPD: She's had progressive worsening of shortness of breath and fatigue. COPD cat score is now 3 and significantly worse from a baseline  of some 9. This chronic worsening. No COPD exacerbation. She feels that Symbicort is not adequate enough for her. She wants to change. She is asking about several different inhalers. In the past she did not tolerate Spiriva dry powder inhaler because of cough. She is willing to give Spiriva Respimat a try. Even though she has Gold stage II COPD with exertional desaturation at baseline she is highly interested in triple inhaler therapy in order to feel better. I recommended pulmonary rehabilitation for her and she says this is a first time I have a spoke to her about it. Of note she is desaturated with exertion in the past but she's not interested in oxygen therapy    CAT COPD Symptom & Quality of Life Score (GSK trademark) 0 is no burden. 5 is highest burden 07/17/2012  06/22/2013 Post knee replacement 04/05/2014  11/22/2014 worsening over time, Symbicort not working  Never Cough -> Cough all the time 2 2 2 2   No phlegm in chest -> Chest is full of phlegm 0 2 0 3  No chest tightness -> Chest feels very tight 0 1 0 1  No dyspnea for 1 flight stairs/hill -> Very dyspneic for 1 flight of stairs 2 1 1 1   No limitations for ADL at home -> Very limited with  ADL at home 0 0 1 2  Confident leaving home -> Not at all confident leaving home 0 0 1 1  Sleep soundly -> Do not sleep soundly because of lung condition 0 2 2 1   Lots of Energy -> No energy at all 2 1 2 3   TOTAL Score (max 40)  6 9 9 14          has a past medical history of COPD (chronic obstructive pulmonary disease); Depression; Gout; Allergic rhinitis; Macrocytosis; Hypertension; Hyperlipidemia; migraines (90's); Arthritis; Osteopenia; Liver cyst; IBS (irritable bowel syndrome); Bilateral cataracts; Hot flashes; Chicken pox; Seasonal allergies; History of blood transfusion; Pneumonia; Lung cancer; Anemia; S/P right THA, AA - Dr. Alvan Dame, ortho (03/07/2012); S/P right TKA (01/02/2013); Smoking (09/12/2011); PONV (postoperative nausea and vomiting);  Migraine; and Cholelithiasis (05/21/2014).   reports that she quit smoking about 3 years ago. Her smoking use included Cigarettes. She has a 25 pack-year smoking history. She has never used smokeless tobacco.  Past Surgical History  Procedure Laterality Date  . Inguinal hernia repair  2000  . Neuroplasty decompression median nerve at carpal tunnel    . Rotator cuff repair  2001    right  . Tonsillectomy  as a child    and adenoids  . Dilation and curettage of uterus      at age 59  . Cyst removed  >21yrs ago    from left leg  . Carpal tunnel release  80's    right   . Total abdominal hysterectomy  2003  . Total hip arthroplasty  2011    left  . Colonoscopy    . Video bronchoscopy  10/20/2011    Procedure: VIDEO BRONCHOSCOPY;  Surgeon: Grace Isaac, MD;  Location: [facility] OR;  Service: Thoracic;  Laterality: N/A;  . Wedge resection  10-20-2011    rt lung  - for lung cancer  . Total hip arthroplasty  03/07/2012    Procedure: TOTAL HIP ARTHROPLASTY ANTERIOR APPROACH;  Surgeon: [name], MD;  Location: [facility];  Service: Orthopedics;  Laterality: Right;  . Total knee arthroplasty Right 01/02/2013    Procedure: RIGHT TOTAL KNEE ARTHROPLASTY;  Surgeon: [name], MD;  Location: [facility];  Service: Orthopedics;  Laterality: Right;  . Total knee arthroplasty Left 04/03/2013    Procedure: LEFT TOTAL KNEE ARTHROPLASTY;  Surgeon: [name], MD;  Location: [facility];  Service: Orthopedics;  Laterality: Left;  . Excisional total knee arthroplasty Right 06/03/2014    Procedure: RIGHT KNEE SCAR EXCISION;  Surgeon: [name], MD;  Location: [facility];  Service: Orthopedics;  Laterality: Right;    Allergies  Allergen Reactions  . Clindamycin/Lincomycin Nausea And Vomiting  . Penicillins Rash       . Spiriva [Tiotropium Bromide Monohydrate]     General intolerance  . Sulfa Antibiotics Hives and Rash    Severe Hives with skin reaction    Immunization History  Administered Date(s)  Administered  . Influenza Split 06/02/2011, 05/09/2012  . Influenza,inj,Quad PF,36+ Mos 05/18/2013, 04/17/2014  . Pneumococcal Conjugate-13 04/05/2014  . Pneumococcal Polysaccharide-23 08/09/1998, 09/21/2011  . Tdap 06/22/2012    Family History  Problem Relation Age of Onset  . Alcohol abuse    . Depression    . Hearing loss    . Heart disease    . Hypertension    . Anesthesia problems Neg Hx   . Hypotension Neg Hx   . Malignant hyperthermia Neg Hx   . Pseudochol deficiency Neg  Hx   . Hypertension Brother      Current outpatient prescriptions:  .  albuterol (PROAIR HFA) 108 (90 BASE) MCG/ACT inhaler, Inhale 2 puffs into the lungs every 6 (six) hours as needed for wheezing or shortness of breath., Disp: 1 Inhaler, Rfl: 1 .  amLODipine (NORVASC) 5 MG tablet, Take 1 tablet (5 mg total) by mouth at bedtime., Disp: 90 tablet, Rfl: 3 .  budesonide-formoterol (SYMBICORT) 80-4.5 MCG/ACT inhaler, Inhale 2 puffs into the lungs 2 (two) times daily., Disp: 1 Inhaler, Rfl: 12 .  calcium carbonate (OS-CAL) 600 MG TABS tablet, Take 600 mg by mouth 2 (two) times daily with a meal., Disp: , Rfl:  .  cetirizine (ZYRTEC) 10 MG tablet, Take 10 mg by mouth daily., Disp: , Rfl:  .  Cholecalciferol (VITAMIN D-3 PO), Take 1,200 mg by mouth every morning., Disp: , Rfl:  .  FLUoxetine (PROZAC) 20 MG capsule, Take 1 capsule (20 mg total) by mouth 2 (two) times daily., Disp: 180 capsule, Rfl: 3 .  loratadine (CLARITIN) 10 MG tablet, Take 10 mg by mouth daily as needed for allergies., Disp: , Rfl:  .  sodium chloride (OCEAN) 0.65 % SOLN nasal spray, Place 1 spray into both nostrils daily as needed for congestion., Disp: , Rfl:  .  TURMERIC PO, Take 1 tablet by mouth 2 (two) times daily. , Disp: , Rfl:  .  vitamin E 400 UNIT capsule, Take 400 Units by mouth every morning. , Disp: , Rfl:  .  [DISCONTINUED] Calcium Carbonate-Vitamin D (CALCIUM + D PO), Take by mouth daily. 500+400 , Disp: , Rfl:     Review of  Systems  Constitutional: Negative for fever and unexpected weight change.  HENT: Positive for rhinorrhea, sinus pressure and sneezing. Negative for congestion, dental problem, ear pain, nosebleeds, postnasal drip, sore throat and trouble swallowing.   Eyes: Negative for redness and itching.  Respiratory: Positive for cough and shortness of breath. Negative for chest tightness and wheezing.   Cardiovascular: Negative for palpitations and leg swelling.  Gastrointestinal: Negative for nausea and vomiting.  Genitourinary: Negative for dysuria.  Musculoskeletal: Negative for joint swelling.  Skin: Negative for rash.  Neurological: Negative for headaches.  Hematological: Does not bruise/bleed easily.  Psychiatric/Behavioral: Negative for dysphoric mood. The patient is not nervous/anxious.        Objective:   Physical Exam  Filed Vitals:   11/22/14 1004  BP: 110/80  Pulse: 80  Height: 5\' 2"  (1.575 m)  Weight: 144 lb 9.6 oz (65.59 kg)  SpO2: 94%   itals reviewed. Constitutional: She is oriented to person, place, and time. She appears well-developed and well-nourished. No distress.  HENT: Chronic postnasal drainage present Head: Normocephalic and atraumatic.  Right Ear: External ear normal.  Left Ear: External ear normal.  Mouth/Throat: Oropharynx is clear and moist. No oropharyngeal exudate.  Eyes: Conjunctivae and EOM are normal. Pupils are equal, round, and reactive to light. Right eye exhibits no discharge. Left eye exhibits no discharge. No scleral icterus.  Neck: Normal range of motion. Neck supple. No JVD present. No tracheal deviation present. No thyromegaly present.  Cardiovascular: Normal rate, regular rhythm, normal heart sounds and intact distal pulses.  Exam reveals no gallop and no friction rub.   No murmur heard. Pulmonary/Chest: Effort normal and breath sounds normal. No respiratory distress. She has no wheezes. She has no rales. She exhibits no tenderness.  Abdominal:  Soft. Bowel sounds are normal. She exhibits no distension and no mass. There  is no tenderness. There is no rebound and no guarding.  Musculoskeletal: Normal range of motion. She exhibits no edema and no tenderness.  Lymphadenopathy:    She has no cervical adenopathy.  Neurological: She is alert and oriented to person, place, and time. She has normal reflexes. No cranial nerve deficit. She exhibits normal muscle tone. Coordination normal.  Skin: Skin is warm and dry. No rash noted. She is not diaphoretic. No erythema. No pallor.  Psychiatric: She has a normal mood and affect. Her behavior is normal. Judgment and thought content normal.         Assessment & Plan:     ICD-9-CM ICD-10-CM   1. Moderate COPD (chronic obstructive pulmonary disease), Sees Dr. Chase Caller, Pulm 496 J44.9 AMB referral to rehabilitation  2. Chronic sinusitis, unspecified location 473.9 J32.9    #chronic rhinosinusitis - - continue nasal steroids; - TAKE NEW script for generic fluticasone inhaler 2 squirts each nostril daily   - encourage daily netti pott as well  #COPD  Gold stge 2  - stable.  - change symbicort to dulera 100/4.5 2 puff twice daily -start spiriva respimat 1 puff daily - refer pulmonary rehab  #lung cancer surveillance  - through Dr Julien Nordmann; last CT Oct 2015 without cancer recurrence  #followup  - 6 months with CAT score - depending on progress might wean down therapy   Dr. Brand Males, M.D., [facility].C.P Pulmonary and Critical Care Medicine Staff Physician [facility] Pager: [phone], If no answer or between  15:00h - 7:00h: call [phone]  11/22/2014 11:15 AM

## 2014-11-29 ENCOUNTER — Telehealth (HOSPITAL_COMMUNITY): Payer: Self-pay

## 2014-11-29 ENCOUNTER — Telehealth: Payer: Self-pay | Admitting: Internal Medicine

## 2014-11-29 MED ORDER — FLUTICASONE FUROATE-VILANTEROL 100-25 MCG/INH IN AEPB: 1.0000 | INHALATION_SPRAY | Freq: Every day | RESPIRATORY_TRACT | Status: DC

## 2014-11-29 NOTE — Telephone Encounter (Signed)
Try BREO low dose 1 puff daily. IF that also denied we can do PA

## 2014-11-29 NOTE — Telephone Encounter (Signed)
Called patient regarding entrance to Pulmonary Rehab.  Patient states that they are interested in attending the program. Billing codes given.  Marshella is going to verify insurance coverage and follow up.

## 2014-11-29 NOTE — Telephone Encounter (Signed)
Patient called to advise [place] that pharmacy told her Ruthe Mannan has been denied and she will need a PA or MR will need to switch her medication.  Called pharmacy, they did not have a list of medication options.  Would you like to do PA?  MR - please advise.

## 2014-11-29 NOTE — Telephone Encounter (Signed)
Called and spoke to pt. Informed pt of the recs per MR. Sample placed up front for pick up and rx sent to pharmacy. Pt verbalized understanding and denied any further questions or concerns at this time.

## 2014-12-05 ENCOUNTER — Telehealth (HOSPITAL_COMMUNITY): Payer: Self-pay

## 2014-12-05 NOTE — Telephone Encounter (Signed)
Patient called stating she has check with her insurance and has a 50.00 copay/vist for pulmonary rehab. She can't afford the program at this time and also feels she does not need rehab at this time. Encouraged her to call back if her financial situation changes or if her conditions worsens in the future.

## 2015-04-06 DIAGNOSIS — J441 Chronic obstructive pulmonary disease with (acute) exacerbation: Secondary | ICD-10-CM | POA: Diagnosis not present

## 2015-04-06 DIAGNOSIS — R0602 Shortness of breath: Secondary | ICD-10-CM | POA: Diagnosis not present

## 2015-04-08 ENCOUNTER — Encounter: Payer: Self-pay | Admitting: Family Medicine

## 2015-04-08 ENCOUNTER — Ambulatory Visit (INDEPENDENT_AMBULATORY_CARE_PROVIDER_SITE_OTHER): Payer: Medicare Other | Admitting: Family Medicine

## 2015-04-08 VITALS — BP 120/84 | HR 95 | Temp 98.1°F | Ht 62.0 in | Wt 138.8 lb

## 2015-04-08 DIAGNOSIS — J209 Acute bronchitis, unspecified: Secondary | ICD-10-CM | POA: Diagnosis not present

## 2015-04-08 MED ORDER — PREDNISONE 20 MG PO TABS: ORAL_TABLET | ORAL | Status: DC

## 2015-04-08 MED ORDER — HYDROCODONE-HOMATROPINE 5-1.5 MG/5ML PO SYRP: 5.0000 mL | ORAL_SOLUTION | Freq: Three times a day (TID) | ORAL | Status: DC | PRN

## 2015-04-08 NOTE — Progress Notes (Signed)
HPI:  Cough: -started: 5 days ago -symptoms:nasal congestion, sore throat, cough, PND, mild SOB -reports monitoring her O2 at home and in normal range for her 94-95 -went to [facility] 2 days ago and report CXR normal, on doxy and given a shot of something and musinex, used albuterol a few days ago -denies:fever, NVD, tooth pain, sinus pain -sick contacts/travel/risks: denies flu exposure, tick exposure or or Ebola risks - husband sick recenlty -Hx of: allergies, COPD (sees pulmonology), hx lung ca s/p lobectomy (sees onc and pulm)  ROS: See pertinent positives and negatives per HPI.  Past Medical History  Diagnosis Date  . COPD (chronic obstructive pulmonary disease)   . Depression     hx ocd and anxiety in record as well  . Gout     last time 6-83yr ago   . Allergic rhinitis   . Macrocytosis   . Hypertension     takes Amlodipine nightly  . Hyperlipidemia     taking Flax Seed Oil and Fish Oil  . Hx of migraines 90's    after menopause HA stopped  . Arthritis     all over  . Osteopenia     takes Calcium and Vit D bid  . Liver cyst   . IBS (irritable bowel syndrome)   . Bilateral cataracts   . Hot flashes     takes Prozac daily  . Chicken pox   . Seasonal allergies   . History of blood transfusion   . Pneumonia     x 2 ;last time back in 1999  . Lung cancer   . Anemia   . S/P right THA, AA - Dr. OAlvan Dame ortho 03/07/2012  . 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 Smoking 09/12/2011  . PONV (postoperative nausea and vomiting)   . Migraine     MIGRAINES RESOLVED WITH MENOPAUSE  . Cholelithiasis 05/21/2014    On CT scan 2015     Past Surgical History  Procedure Laterality Date  . Inguinal hernia repair  2000  . Neuroplasty decompression median nerve at carpal tunnel    . Rotator cuff repair  2001    right  . Tonsillectomy  as a child    and adenoids  . Dilation and curettage of uterus      at age 74 . Cyst removed  >331yrago    from left leg  . Carpal tunnel release  80's    right   . Total abdominal hysterectomy  2003  . Total hip arthroplasty  2011    left  . Colonoscopy    . Video bronchoscopy  10/20/2011    Procedure: VIDEO BRONCHOSCOPY;  Surgeon: EdGrace IsaacMD;  [facility];  Service: Thoracic;  Laterality: N/A;  . Wedge resection  10-20-2011    rt lung  - for lung cancer  . Total hip arthroplasty  03/07/2012    Procedure: TOTAL HIP ARTHROPLASTY ANTERIOR APPROACH;  Surgeon: MaMauri PoleMD;  Location: [facility];  Service: Orthopedics;  Laterality: Right;  . Total knee arthroplasty Right 01/02/2013    Procedure: RIGHT TOTAL KNEE ARTHROPLASTY;  Surgeon: MaMauri PoleMD;  Location: [facility];  Service: Orthopedics;  Laterality: Right;  . Total knee arthroplasty Left 04/03/2013    Procedure: LEFT TOTAL KNEE ARTHROPLASTY;  Surgeon: MaMauri PoleMD;  Location: [facility];  Service: Orthopedics;  Laterality: Left;  . Excisional total knee arthroplasty Right 06/03/2014    Procedure: RIGHT KNEE SCAR EXCISION;  Surgeon:  [name], MD;  Location: [facility];  Service: Orthopedics;  Laterality: Right;    Family History  Problem Relation Age of Onset  . Alcohol abuse    . Depression    . Hearing loss    . Heart disease    . Hypertension    . Anesthesia problems Neg Hx   . Hypotension Neg Hx   . Malignant hyperthermia Neg Hx   . Pseudochol deficiency Neg Hx   . Hypertension Brother     Social History   Social History  . Marital Status: Married    Spouse Name: N/A  . Number of Children: N/A  . Years of Education: N/A   Occupational History  . retired    Social History Main Topics  . Smoking status: Former Smoker -- 0.50 packs/day for 50 years    Types: Cigarettes    Quit date: 09/23/2011  . Smokeless tobacco: Never Used  . Alcohol Use: 0.0 oz/week     Comment: 2 beers every night  . Drug Use: No  . Sexual Activity: No   Other Topics Concern  . None   Social History Narrative     Current outpatient prescriptions:  .  albuterol  (PROAIR HFA) 108 (90 BASE) MCG/ACT inhaler, Inhale 2 puffs into the lungs every 6 (six) hours as needed for wheezing or shortness of breath., Disp: 1 Inhaler, Rfl: 1 .  amLODipine (NORVASC) 5 MG tablet, Take 1 tablet (5 mg total) by mouth at bedtime., Disp: 90 tablet, Rfl: 3 .  calcium carbonate (OS-CAL) 600 MG TABS tablet, Take 600 mg by mouth 2 (two) times daily with a meal., Disp: , Rfl:  .  cetirizine (ZYRTEC) 10 MG tablet, Take 10 mg by mouth daily., Disp: , Rfl:  .  Cholecalciferol (VITAMIN D-3 PO), Take 1,200 mg by mouth every morning., Disp: , Rfl:  .  DOXYCYCLINE PO, Take 100 mg by mouth., Disp: , Rfl:  .  FLUoxetine (PROZAC) 20 MG capsule, Take 1 capsule (20 mg total) by mouth 2 (two) times daily., Disp: 180 capsule, Rfl: 3 .  fluticasone (FLONASE) 50 MCG/ACT nasal spray, Place 2 sprays into both nostrils daily., Disp: 16 g, Rfl: 5 .  Fluticasone Furoate-Vilanterol (BREO ELLIPTA) 100-25 MCG/INH AEPB, Inhale 1 puff into the lungs daily., Disp: 60 each, Rfl: 5 .  GuaiFENesin (MUCINEX PO), Take by mouth as needed., Disp: , Rfl:  .  loratadine (CLARITIN) 10 MG tablet, Take 10 mg by mouth daily as needed for allergies., Disp: , Rfl:  .  sodium chloride (OCEAN) 0.65 % SOLN nasal spray, Place 1 spray into both nostrils daily as needed for congestion., Disp: , Rfl:  .  Tiotropium Bromide Monohydrate (SPIRIVA RESPIMAT) 2.5 MCG/ACT AERS, Inhale 2 puffs into the lungs daily., Disp: 1 Inhaler, Rfl: 5 .  TURMERIC PO, Take 1 tablet by mouth 2 (two) times daily. , Disp: , Rfl:  .  vitamin E 400 UNIT capsule, Take 400 Units by mouth every morning. , Disp: , Rfl:  .  HYDROcodone-homatropine (HYCODAN) 5-1.5 MG/5ML syrup, Take 5 mLs by mouth every 8 (eight) hours as needed for cough., Disp: 120 mL, Rfl: 0 .  predniSONE (DELTASONE) 20 MG tablet, '40mg'$  (2 tablets) daily for 3 days, then '20mg'$  (1 tablet daily for 3 days), Disp: 9 tablet, Rfl: 0 .  [DISCONTINUED] Calcium Carbonate-Vitamin D (CALCIUM + D PO), Take  by mouth daily. 500+400 , Disp: , Rfl:   EXAM:  Filed Vitals:   04/08/15 1603  BP: 120/84  Pulse: 95  Temp: 98.1 F (36.7 C)    Body mass index is 25.38 kg/(m^2).  GENERAL: vitals reviewed and listed above, alert, oriented, appears well hydrated and in no acute distress  HEENT: atraumatic, conjunttiva clear, no obvious abnormalities on inspection of external nose and ears, normal appearance of ear canals and TMs, clear nasal congestion, mild post oropharyngeal erythema with PND, no tonsillar edema or exudate, no sinus TTP  NECK: no obvious masses on inspection, no JVD  LUNGS: clear to auscultation bilaterally, no wheezes, rales or rhonchi, good air movement  CV: HRRR, no peripheral edema  MS: moves all extremities without noticeable abnormality  PSYCH: pleasant and cooperative, no obvious depression or anxiety  ASSESSMENT AND PLAN:  Discussed the following assessment and plan:  Acute bronchitis, unspecified organism  -advised assistant to obtain CXR results -suspect viral bronchitis in setting of COPD -opted for oral steroid taper and cough medication - discussed risks and follow up and emergency precuations -of course, we advised to return or notify a doctor immediately if symptoms worsen or persist or new concerns arise.    There are no Patient Instructions on file for this visit.   Colin Benton R.

## 2015-04-08 NOTE — Patient Instructions (Signed)
-  As we discussed, we have prescribed a new medication for you at this appointment. We discussed the common and serious potential adverse effects of this medication and you can review these and more with the pharmacist when you pick up your medication.  Please follow the instructions for use carefully and notify us immediately if you have any problems taking this medication.  -follow up as needed and seek care immediatly if worsening or having difficulty breathing

## 2015-04-08 NOTE — Progress Notes (Signed)
Pre visit review using our clinic review tool, if applicable. No additional management support is needed unless otherwise documented below in the visit note. 

## 2015-04-10 ENCOUNTER — Telehealth: Payer: Self-pay | Admitting: Family Medicine

## 2015-04-10 DIAGNOSIS — J432 Centrilobular emphysema: Secondary | ICD-10-CM

## 2015-04-10 DIAGNOSIS — R9389 Abnormal findings on diagnostic imaging of other specified body structures: Secondary | ICD-10-CM

## 2015-04-10 DIAGNOSIS — C3490 Malignant neoplasm of unspecified part of unspecified bronchus or lung: Secondary | ICD-10-CM

## 2015-04-10 NOTE — Telephone Encounter (Signed)
Received CXR from [facility] visit. ?infiltrat, scar or atelectasis r mid lung - radiologist advised follow up to ensure resolution. Called pt.  She is on doxy and prednisone and reports is feeling better. Advised follow up/return precautions and advised repeat CXR in 4 weeks at [place]. She agreed to put on calendar to do.

## 2015-04-21 ENCOUNTER — Ambulatory Visit (INDEPENDENT_AMBULATORY_CARE_PROVIDER_SITE_OTHER): Payer: Medicare Other | Admitting: Pulmonary Disease

## 2015-04-21 ENCOUNTER — Ambulatory Visit (INDEPENDENT_AMBULATORY_CARE_PROVIDER_SITE_OTHER)
Admission: RE | Admit: 2015-04-21 | Discharge: 2015-04-21 | Disposition: A | Discharge status: Home / Self Care | Payer: Medicare Other | Source: Ambulatory Visit | Attending: Pulmonary Disease | Admitting: Pulmonary Disease

## 2015-04-21 ENCOUNTER — Encounter: Payer: Self-pay | Admitting: Pulmonary Disease

## 2015-04-21 VITALS — BP 130/80 | HR 82 | Temp 97.8°F | Wt 137.0 lb

## 2015-04-21 DIAGNOSIS — J441 Chronic obstructive pulmonary disease with (acute) exacerbation: Secondary | ICD-10-CM

## 2015-04-21 DIAGNOSIS — C3491 Malignant neoplasm of unspecified part of right bronchus or lung: Secondary | ICD-10-CM

## 2015-04-21 DIAGNOSIS — R0602 Shortness of breath: Secondary | ICD-10-CM | POA: Diagnosis not present

## 2015-04-21 DIAGNOSIS — R05 Cough: Secondary | ICD-10-CM | POA: Diagnosis not present

## 2015-04-21 MED ORDER — LEVOFLOXACIN 750 MG PO TABS: 750.0000 mg | ORAL_TABLET | Freq: Every day | ORAL | Status: DC

## 2015-04-21 MED ORDER — METHYLPREDNISOLONE ACETATE 80 MG/ML IJ SUSP
80.0000 mg | Freq: Once | INTRAMUSCULAR | Status: AC
  Administered 2015-04-21: 80 mg via INTRAMUSCULAR

## 2015-04-21 MED ORDER — METHYLPREDNISOLONE 8 MG PO TABS: ORAL_TABLET | ORAL | Status: DC

## 2015-04-21 MED ORDER — FIRST-DUKES MOUTHWASH MT SUSP: 5.0000 mL | Freq: Four times a day (QID) | OROMUCOSAL | Status: DC | PRN

## 2015-04-21 NOTE — Progress Notes (Signed)
Subjective:     Patient ID: [name], female   DOB: 07/20/1941, 74 y.o.   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  PMD is Dr Blima Singer, [facility].  #Known spring allergies  #IBS, OA - knees and hip and does regular water exercises.   #Drinks 2 coors lite each night  -CAGE -negative early 2013.   #Ex-Smoker - 25 pack-year smoking history. Quit 2013 after diagnosis of lung cancer.  #COPD-MM genotype - 2013: Pre RUL lobectomy and Pre Rx: fev1 1.1L/60% , DLCO 9.6/56%, TLC 5.6/128%  - 2013 May: Post lobectomy and post Rx: fev1: 1.28L/60%, ratio 49 . No desat walk test - Rx spiriva, symbicort since spring 2013 - 2014 Nov: 06/22/2013. FEV1 is 1.3 L/62%. No post bronchodilator change. FVC is 2.6 L postop day later which is 95%. Ratio is 50. Total lung capacity is 4.96L/104%. DLCO is level 0.7/54%. - On synmbicort alone 04/05/2014 - Walk test today 185 feet x 3 laps on RA: Desat to < 88% at 2 laps - Change to [facility] plus Spiriva Respimat April 2016  # Stage 1 lung cancer: pT1b, pN0 Stage 08 October 2011 - s/p lobectomy by Dr Servando Snare - surveillance by Dr Earlie Server - 05/15/13 - Clear CT. No recurrence - October 2015 - clear CT,No recurrence   OV 11/22/2014 w/ MR>>   Chief Complaint  Patient presents with  . Follow-up    Pt here for 9 month f/u. Pt c/o increase in fatigue and increase in SOB. Pt c/o seasonal allergies with sneezing, coughing, watery eyes. Pt denies CP/tightness.   9 month follow-up:  - chronic rhinosinusitis: still a problem. Moderate severity. She feels it will never go away. She does not want tests or referrals. Netti pot and nasal steroids help. This time she is compliant with nasal steroids. She is asking for a prescription so she can save on cost  - lung cancer: October 2015 CT scan done by  oncology is without any recurrence  - COPD: She's had progressive worsening of shortness of breath and fatigue. COPD cat score is now 34 and significantly worse from a baseline of some 9. This chronic worsening. No COPD exacerbation. She feels that Symbicort is not adequate enough for her. She wants to change. She is asking about several different inhalers. In the past she did not tolerate Spiriva dry powder inhaler because of cough. She is willing to give Spiriva Respimat a try. Even though she has Gold stage II COPD with exertional desaturation at baseline she is highly interested in triple inhaler therapy in order to feel better. I recommended pulmonary rehabilitation for her and she says this is a first time I have a spoke to her about it. Of note she is desaturated with exertion in the past but she's not interested in oxygen therapy        ~  April 21, 2015:  Add-on appt w/ SN>>       8 y/o WF w/ hx GOLD Stage 2 COPD, & RUL lung cancer 10/2011- squamous cell ca s/p wedge resection by DrGerhardt & no additional therapy rec by DrMohamed who continues to follow;  She quit smoking 2013 & is treated w/ BREO- one inhalation daily, Spriva respimat- 2sp daily, ProairHFA prn, plus Claritin/ Flonase/ Guaifenesin, Hycodan... She was prev referred to [facility] but she did not participate due to cost issues [employer]  wouldn't pay")... She presents today w/ a 3wk hx cough, hoarseness, yellow sput and incr SOB w/o f/c/s;  She was seen at an urgent care where CXR was reported neg & given a shot & antibiotic;  Then she went to her PCP & given a tapering course of Pred & cough syrup;  She notes symptoms have persisted despite this Rx...      EXAM showed Afeb, VSS, O2sat=95% on RA;  HEENT- sl red w/o exud, sl hoarse;  Chest- few bibasilar rhonchi w/o wheezing/ rales/ or signs of consolidation;  Heart- RR w/o m/r/g;  Abd- soft, neg;  Ext- neg w/o c/c/e...   CXR 04/21/15 showed norm heart size, sl hyperinflation,  scarring in right mid lung, NAD...   IMP/PLAN>>     COPD exac -- we discussed her persisent symptoms and rec treatment w/ [id number] (& Align), Depo80 & Medrol'8mg'$ - tapering sched (see AVS), Mucinex600-2Bid, Fluids, MMW prn for throat symptoms.    GOLD Stage 2 COPD, ex-smoker -- continue BREO, SPIRIVA Respimat, PROAIR-HFA rescue inhaler...    S/p RUL wedge resection SqCellCa 10/2011 by DrGerhardt, followed by DrMohamed w/ serial CT Chest Q68mofor 226yr now yearly w/ repeat CT due next month...     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     Past Medical History  Diagnosis Date  . COPD (chronic obstructive pulmonary disease)   . Depression     hx ocd and anxiety in record as well  . Gout     last time 6-8y50yrgo   . Allergic rhinitis   . Macrocytosis   . Hypertension     takes Amlodipine nightly  . Hyperlipidemia     taking Flax Seed Oil and Fish Oil  . Hx of migraines 90's    after menopause HA stopped  . Arthritis     all over  . Osteopenia     takes Calcium and Vit D bid  . Liver cyst   . IBS (irritable bowel syndrome)   . Bilateral cataracts   . Hot flashes     takes Prozac daily  . Chicken pox   . Seasonal allergies   . History of blood transfusion   . Pneumonia     x 2 ;last time back in 1999  . Lung cancer   . Anemia   . S/P right THA, AA - Dr. OliAlvan Damertho 03/07/2012  . 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 Smoking 09/12/2011  . PONV (postoperative nausea and vomiting)   . Migraine     MIGRAINES RESOLVED WITH MENOPAUSE  . Cholelithiasis 05/21/2014    On CT scan 2015     Past Surgical History  Procedure Laterality Date  . Inguinal hernia repair  2000  . Neuroplasty decompression median nerve at carpal tunnel    . Rotator cuff repair  2001    right  . Tonsillectomy  as a child    and adenoids  . Dilation and curettage of uterus      at age 89 3 Cyst removed  >37y6yro    from left leg  . Carpal tunnel release  80's    right   . Total abdominal hysterectomy  2003  .  Total hip arthroplasty  2011    left  . Colonoscopy    . Video bronchoscopy  10/20/2011    Procedure: VIDEO BRONCHOSCOPY;  Surgeon: EdwaGrace Isaac;  [facility];  Laterality: N/A;  . Wedge resection  10-20-2011  rt lung  - for lung cancer  . Total hip arthroplasty  03/07/2012    Procedure: TOTAL HIP ARTHROPLASTY ANTERIOR APPROACH;  Surgeon: [name], MD;  Location[facility];  Service: Orthopedics;  Laterality: Right;  . Total knee arthroplasty Right 01/02/2013    Procedure: RIGHT TOTAL KNEE ARTHROPLASTY;  Surgeon: [name], MD;  Location: [facility];  Service: Orthopedics;  Laterality: Right;  . Total knee arthroplasty Left 04/03/2013    Procedure: LEFT TOTAL KNEE ARTHROPLASTY;  Surgeon: [name], MD;  [facility];  Service: Orthopedics;  Laterality: Left;  . Excisional total knee arthroplasty Right 06/03/2014    Procedure: RIGHT KNEE SCAR EXCISION;  Surgeon: [name], MD;  Location[facility];  Service: Orthopedics;  Laterality: Right;    Outpatient Encounter Prescriptions as of 04/21/2015  Medication Sig  . albuterol (PROAIR HFA) 108 (90 BASE) MCG/ACT inhaler Inhale 2 puffs into the lungs every 6 (six) hours as needed for wheezing or shortness of breath.  Marland Kitchen amLODipine (NORVASC) 5 MG tablet Take 1 tablet (5 mg total) by mouth at bedtime.  . calcium carbonate (OS-CAL) 600 MG TABS tablet Take 600 mg by mouth 2 (two) times daily with a meal.  . Cholecalciferol (VITAMIN D-3 PO) Take 1,200 mg by mouth every morning.  Marland Kitchen DOXYCYCLINE PO Take 100 mg by mouth. (she has finished this Rx)  . FLUoxetine (PROZAC) 20 MG capsule Take 1 capsule (20 mg total) by mouth 2 (two) times daily.  . fluticasone (FLONASE) 50 MCG/ACT nasal spray Place 2 sprays into both nostrils daily.  . Fluticasone Furoate-Vilanterol (BREO ELLIPTA) 100-25 MCG/INH AEPB Inhale 1 puff into the lungs daily.  . GuaiFENesin (MUCINEX PO) Take by mouth as needed.  Marland Kitchen HYDROcodone-homatropine  (HYCODAN) 5-1.5 MG/5ML syrup Take 5 mLs by mouth every 8 (eight) hours as needed for cough.  . loratadine (CLARITIN) 10 MG tablet Take 10 mg by mouth daily as needed for allergies.  . sodium chloride (OCEAN) 0.65 % SOLN nasal spray Place 1 spray into both nostrils daily as needed for congestion.  . Tiotropium Bromide Monohydrate (SPIRIVA RESPIMAT) 2.5 MCG/ACT AERS Inhale 2 puffs into the lungs daily.  . TURMERIC PO Take 1 tablet by mouth 2 (two) times daily.   . vitamin E 400 UNIT capsule Take 400 Units by mouth every morning.   . cetirizine (ZYRTEC) 10 MG tablet Take 10 mg by mouth daily.  . predniSONE (DELTASONE) 20 MG tablet '40mg'$  (2 tablets) daily for 3 days, then '20mg'$  (1 tablet daily for 3 days) (Patient not taking: Reported on 04/21/2015)    Allergies  Allergen Reactions  . Clindamycin/Lincomycin Nausea And Vomiting  . Penicillins Rash       . Spiriva [Tiotropium Bromide Monohydrate]     General intolerance  . Sulfa Antibiotics Hives and Rash    Severe Hives with skin reaction    Current Medications, Allergies, Past Medical History, Past Surgical History, Family History, and Social History were reviewed in [employer] record.   Review of Systems            All symptoms NEG except where BOLDED >>  Constitutional:  F/C/S, fatigue, anorexia, unexpected weight change. HEENT:  HA, visual changes, hearing loss, earache, nasal symptoms, sore throat, mouth sores, hoarseness. Resp:  cough, sputum, hemoptysis; SOB, tightness, wheezing. Cardio:  CP, palpit, DOE, orthopnea, edema. GI:  N/V/D/C, blood in stool; reflux, abd pain, distention, gas. GU:  dysuria, freq, urgency, hematuria, flank pain, voiding  difficulty. MS:  joint pain, swelling, tenderness, decr ROM; neck pain, back pain, etc. Neuro:  HA, tremors, seizures, dizziness, syncope, weakness, numbness, gait abn. Skin:  suspicious lesions or skin rash. Heme:  adenopathy, bruising, bleeding. Psyche:   confusion, agitation, sleep disturbance, hallucinations, anxiety, depression suicidal.   Objective:   Physical Exam      Vital Signs:  Reviewed...  General:  WD, WN, 74 y/o WF in NAD; alert & oriented; pleasant & cooperative... HEENT:  [place]/AT; Conjunctiva- pink, Sclera- nonicteric, EOM-wnl, PERRLA, EACs-clear, TMs-wnl; NOSE-clear; THROAT-sl red w/o exud. Neck:  Supple w/ fair ROM; no JVD; normal carotid impulses w/o bruits; no thyromegaly or nodules palpated; no lymphadenopathy. Chest:  decr BS bilat w/ scat bibasilar rhonchi, no wheezing/ rales/ consolidation... Heart:  Regular Rhythm; norm S1 & S2 without murmurs, rubs, or gallops detected. Abdomen:  Soft & nontender- no guarding or rebound; normal bowel sounds; no organomegaly or masses palpated. Ext:  decrROM; without deformities +arthritic changes; no varicose veins, venous insuffic, or edema;  Pulses intact w/o bruits. Neuro:  No focal neuro deficits; sensory testing normal; gait normal & balance OK. Derm:  No lesions noted; no rash etc. Lymph:  No cervical, supraclavicular, axillary, or inguinal adenopathy palpated.   Assessment:      IMP/PLAN>>     COPD exac -- we discussed her persisent symptoms and rec treatment w/ [id number] (& Align), Depo80 & Medrol'8mg'$ - tapering sched (see AVS), Mucinex600-2Bid, Fluids, MMW prn for throat symptoms.    GOLD Stage 2 COPD, ex-smoker -- continue BREO, SPIRIVA Respimat, PROAIR-HFA rescue inhaler...    S/p RUL wedge resection SqCellCa 10/2011 by DrGerhardt, followed by DrMohamed w/ serial CT Chest Q34mofor 283yr now yearly w/ repeat CT due next month...     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      Plan:     Patient's Medications  New Prescriptions   DIPHENHYD-HYDROCORT-NYSTATIN (FIRST-DUKES MOUTHWASH) SUSP    Use as directed 5 mLs in the mouth or throat 4 (four) times daily as needed.   LEVOFLOXACIN (LEVAQUIN) 750 MG TABLET    Take 1 tablet (750 mg total) by mouth daily.   METHYLPREDNISOLONE  (MEDROL) 8 MG TABLET    Use as directed  Previous Medications   ALBUTEROL (PROAIR HFA) 108 (90 BASE) MCG/ACT INHALER    Inhale 2 puffs into the lungs every 6 (six) hours as needed for wheezing or shortness of breath.   AMLODIPINE (NORVASC) 5 MG TABLET    Take 1 tablet (5 mg total) by mouth at bedtime.   CALCIUM CARBONATE (OS-CAL) 600 MG TABS TABLET    Take 600 mg by mouth 2 (two) times daily with a meal.   CETIRIZINE (ZYRTEC) 10 MG TABLET    Take 10 mg by mouth daily.   CHOLECALCIFEROL (VITAMIN D-3 PO)    Take 1,200 mg by mouth every morning.   FLUOXETINE (PROZAC) 20 MG CAPSULE    Take 1 capsule (20 mg total) by mouth 2 (two) times daily.   FLUTICASONE (FLONASE) 50 MCG/ACT NASAL SPRAY    Place 2 sprays into both nostrils daily.   FLUTICASONE FUROATE-VILANTEROL (BREO ELLIPTA) 100-25 MCG/INH AEPB    Inhale 1 puff into the lungs daily.   GUAIFENESIN (MUCINEX PO)    Take by mouth as needed.   HYDROCODONE-HOMATROPINE (HYCODAN) 5-1.5 MG/5ML SYRUP    Take 5 mLs by mouth every 8 (eight) hours as needed for cough.   LORATADINE (CLARITIN) 10 MG TABLET    Take 10 mg  by mouth daily as needed for allergies.   SODIUM CHLORIDE (OCEAN) 0.65 % SOLN NASAL SPRAY    Place 1 spray into both nostrils daily as needed for congestion.   TIOTROPIUM BROMIDE MONOHYDRATE (SPIRIVA RESPIMAT) 2.5 MCG/ACT AERS    Inhale 2 puffs into the lungs daily.   TURMERIC PO    Take 1 tablet by mouth 2 (two) times daily.    VITAMIN E 400 UNIT CAPSULE    Take 400 Units by mouth every morning.   Modified Medications   No medications on file  Discontinued Medications   No medications on file

## 2015-04-21 NOTE — Patient Instructions (Signed)
Today we updated your med list in our EPIC system...    Continue your current medications the same...    Continue your [place], [place], MUCINEX, fluids...  We are treating your COPD exacerbation w/     LEVAQUIN '750mg'$  - take one tab daily til gone...    Take a PROBIOTIC like ALIGN daily while you are on the Levaquin...    We gave you a Depo shot today...    Start the MEDROL '8mg'$  tabs as follows (start in AM 9/13)>       Start w/ one tab twice daily for 4 days...       Then decrease to 1 tab each AM for 4 days...       Then decrease to 1/2 tab each AM for 4 days...       Then decrease to 1/2 tab EVERY OTHER DAY til gone...  We also wrote for some MAGIC MOUTHWASH -     take one tsp, gargle & swallow up to 4 times daily as needed for throat symptoms...  Today we did a follow up CXR...    We will contact you w/ the results when available...   Rest at home & drink lots of water/ fluids/ juices etc...  Call for any questions or if I can be of service in any way.Marland KitchenMarland Kitchen

## 2015-05-21 ENCOUNTER — Encounter: Payer: Self-pay | Admitting: Internal Medicine

## 2015-05-21 ENCOUNTER — Encounter (HOSPITAL_COMMUNITY): Payer: Self-pay

## 2015-05-21 ENCOUNTER — Ambulatory Visit (INDEPENDENT_AMBULATORY_CARE_PROVIDER_SITE_OTHER): Payer: Medicare Other | Admitting: Internal Medicine

## 2015-05-21 ENCOUNTER — Other Ambulatory Visit (HOSPITAL_BASED_OUTPATIENT_CLINIC_OR_DEPARTMENT_OTHER): Payer: Medicare Other

## 2015-05-21 ENCOUNTER — Ambulatory Visit (HOSPITAL_COMMUNITY)
Admission: RE | Admit: 2015-05-21 | Discharge: 2015-05-21 | Disposition: A | Discharge status: Home / Self Care | Payer: Medicare Other | Source: Ambulatory Visit | Attending: Internal Medicine | Admitting: Internal Medicine

## 2015-05-21 VITALS — BP 112/74 | HR 82 | Ht 62.0 in | Wt 138.0 lb

## 2015-05-21 DIAGNOSIS — J329 Chronic sinusitis, unspecified: Secondary | ICD-10-CM

## 2015-05-21 DIAGNOSIS — C3491 Malignant neoplasm of unspecified part of right bronchus or lung: Secondary | ICD-10-CM

## 2015-05-21 DIAGNOSIS — Z902 Acquired absence of lung [part of]: Secondary | ICD-10-CM | POA: Insufficient documentation

## 2015-05-21 DIAGNOSIS — J449 Chronic obstructive pulmonary disease, unspecified: Secondary | ICD-10-CM | POA: Diagnosis not present

## 2015-05-21 DIAGNOSIS — Z85118 Personal history of other malignant neoplasm of bronchus and lung: Secondary | ICD-10-CM

## 2015-05-21 DIAGNOSIS — Z23 Encounter for immunization: Secondary | ICD-10-CM | POA: Diagnosis not present

## 2015-05-21 LAB — COMPREHENSIVE METABOLIC PANEL (CC13)
ALT: 13 U/L (ref 0–55)
AST: 14 U/L (ref 5–34)
Albumin: 3.9 g/dL (ref 3.5–5.0)
Alkaline Phosphatase: 65 U/L (ref 40–150)
Anion Gap: 9 mEq/L (ref 3–11)
BUN: 7.8 mg/dL (ref 7.0–26.0)
CHLORIDE: 104 meq/L (ref 98–109)
CO2: 27 meq/L (ref 22–29)
Calcium: 9.2 mg/dL (ref 8.4–10.4)
Creatinine: 0.8 mg/dL (ref 0.6–1.1)
EGFR: 75 mL/min/{1.73_m2} — AB (ref >90)
GLUCOSE: 93 mg/dL (ref 70–140)
Potassium: 4 mEq/L (ref 3.5–5.1)
Sodium: 140 mEq/L (ref 136–145)
Total Bilirubin: 0.77 mg/dL (ref 0.20–1.20)
Total Protein: 6.4 g/dL (ref 6.4–8.3)

## 2015-05-21 LAB — CBC WITH DIFFERENTIAL/PLATELET
BASO%: 1.1 % (ref 0.0–2.0)
BASOS ABS: 0.1 10*3/uL (ref 0.0–0.1)
EOS ABS: 0.1 10*3/uL (ref 0.0–0.5)
EOS%: 1.1 % (ref 0.0–7.0)
HCT: 42.8 % (ref 34.8–46.6)
HGB: 14.6 g/dL (ref 11.6–15.9)
LYMPH%: 17.8 % (ref 14.0–49.7)
MCH: 32.3 pg (ref 25.1–34.0)
MCHC: 34.1 g/dL (ref 31.5–36.0)
MCV: 94.7 fL (ref 79.5–101.0)
MONO#: 0.4 10*3/uL (ref 0.1–0.9)
MONO%: 7.5 % (ref 0.0–14.0)
NEUT#: 4 10*3/uL (ref 1.5–6.5)
NEUT%: 72.5 % (ref 38.4–76.8)
PLATELETS: 170 10*3/uL (ref 145–400)
RBC: 4.52 10*6/uL (ref 3.70–5.45)
RDW: 12.9 % (ref 11.2–14.5)
WBC: 5.6 10*3/uL (ref 3.9–10.3)
lymph#: 1 10*3/uL (ref 0.9–3.3)

## 2015-05-21 MED ORDER — IOHEXOL 300 MG/ML  SOLN
75.0000 mL | Freq: Once | INTRAMUSCULAR | Status: AC | PRN
  Administered 2015-05-21: 75 mL via INTRAVENOUS

## 2015-05-21 NOTE — Patient Instructions (Addendum)
#  chronic rhinosinusitis - - continue nasal steroids   - encourage daily netti pott as well - if headache does not go away, call us back  #COPD  Gold stge 2 - glad you are over sept 2016 flare up  - stable.  -  breo daily -spiriva respimat 1 puff daily - flu shot 05/21/2015    #lung cancer surveillance  - through Dr Julien Nordmann; last CT Oct 2016 without cancer recurrence  #followup  - 6 months with CAT score - depending on progress might wean down therapy

## 2015-05-21 NOTE — Progress Notes (Signed)
Subjective:     Patient ID: [name], female   DOB: [birth year], 74 y.o.   MRN: [id number]  HPI PMD is Dr Blima Singer, [facility].  #Known spring allergies  #IBS, OA - knees and hip and does regular water exercises.   #Drinks 2 coors lite each night  -CAGE -negative early 2013.   #Ex-Smoker - 25 pack-year smoking history. Quit 2013 after diagnosis of lung cancer.  #COPD-MM genotype - 2013: Pre RUL lobectomy and Pre Rx: fev1 1.1L/60% , DLCO 9.6/56%, TLC 5.6/128%  - 2013 May: Post lobectomy and post Rx: fev1: 1.28L/60%, ratio 49 . No desat walk test - Rx spiriva, symbicort since spring 2013 - 2014 Nov: 06/22/2013. FEV1 is 1.3 L/62%. No post bronchodilator change. FVC is 2.6 L postop day later which is 95%. Ratio is 50. Total lung capacity is 4.96L/104%. DLCO is level 0.7/54%. - On synmbicort alone 04/05/2014 - Walk test today 185 feet x 3 laps on RA: Desat to < 88% at 2 laps - Change to [facility] plus Spiriva Respimat April 2016  # Stage 1 lung cancer: pT1b, pN0 Stage 08 October 2011 - s/p lobectomy by Dr Servando Snare - surveillance by Dr Earlie Server - 05/15/13 - Clear CT. No recurrence - October 2015 - clear CT,No recurrence           OV 05/21/2015  Chief Complaint  Patient presents with  . Follow-up    pt following for COPD. pt states she was in a few weeks ago for a bad bug with a cough. pt states the cough is better. pt states breathing has improved, no c/o SOB or ches tightness.     Follow-upfollow-up  Moderate COPD: In August/September 2016 despite my advice she went to [place] and went up and out acute of 7500 feet. She said she did not have any problems but on the way back in the plane she picked up COPD exacerbation. She's had for 5 week she was in bed and miserable with chronic cough. She had to be treated with 2 rounds of antibiotics and prednisone. She was in fact seen by Dr. Nicki Reaper in our office 04/21/2015  which notes are reviewed. Since then she has bounced back and is at baseline and is feeling great. She is on Spiriva and Symbicort. She is asking about innogent oxygen system for exertion. She'll have the flu shot today  Chronic sinus drainage: She is compliant with nasal steroid and saline nasal wash. Reports that with exacerbation she has had sinus headache that is still persistent for over a month but is improving steadily. She does not want any further investigation.  Lung cancer surveillance: She had CT scan of the chest today without contrast. Personally visualized the film. No evidence of recurrence or nodules or pneumonia and I agree with the final report   Ct Chest W Contrast  05/21/2015  CLINICAL DATA:  Right lung cancer, for restaging EXAM: CT CHEST WITH CONTRAST TECHNIQUE: Multidetector CT imaging of the chest was performed during intravenous contrast administration. CONTRAST:  29m OMNIPAQUE IOHEXOL 300 MG/ML  SOLN COMPARISON:  05/20/2014 FINDINGS: Mediastinum/Nodes: Heart is normal size.  No pericardial effusion. Coronary atherosclerosis. Atherosclerotic calcifications aortic arch. Small mediastinal lymph nodes which do not meet pathologic CT size criteria. Visualized thyroid is unremarkable. Lungs/Pleura: Status post right upper lobe wedge resection. Mild scarring in the lingula. No suspicious pulmonary nodules. Moderate centrilobular and paraseptal emphysematous changes, upper lobe predominant. No focal consolidation. No pleural effusion or pneumothorax. Upper abdomen: Visualized upper  abdomen is notable for numerous hepatic cysts, measuring up to 3.8 cm in the central left liver (series 2/ image 9), including a 2.1 cm cyst with peripheral calcifications in the medial segment left hepatic lobe (series 2/image 53). Additionally noted is a 2.0 cm gallstone (series 2/image 64). Musculoskeletal: Degenerative changes of the visualized thoracolumbar spine. IMPRESSION: Status post right upper lobe  wedge resection. No evidence of recurrent or metastatic disease. Electronically Signed   By: Julian Hy M.D.   On: 05/21/2015 12:12      Immunization History  Administered Date(s) Administered  . Influenza Split 06/02/2011, 05/09/2012  . Influenza,inj,Quad PF,36+ Mos 05/18/2013, 04/17/2014  . Pneumococcal Conjugate-13 04/05/2014  . Pneumococcal Polysaccharide-23 08/09/1998, 09/21/2011  . Tdap 06/22/2012     Current outpatient prescriptions:  .  albuterol (PROAIR HFA) 108 (90 BASE) MCG/ACT inhaler, Inhale 2 puffs into the lungs every 6 (six) hours as needed for wheezing or shortness of breath., Disp: 1 Inhaler, Rfl: 1 .  amLODipine (NORVASC) 5 MG tablet, Take 1 tablet (5 mg total) by mouth at bedtime., Disp: 90 tablet, Rfl: 3 .  calcium & magnesium carbonates (MYLANTA) 311-232 MG tablet, Take 1 tablet by mouth daily., Disp: , Rfl:  .  Cholecalciferol (VITAMIN D-3 PO), Take 1,200 mg by mouth every morning., Disp: , Rfl:  .  FLUoxetine (PROZAC) 20 MG capsule, Take 1 capsule (20 mg total) by mouth 2 (two) times daily., Disp: 180 capsule, Rfl: 3 .  fluticasone (FLONASE) 50 MCG/ACT nasal spray, Place 2 sprays into both nostrils daily., Disp: 16 g, Rfl: 5 .  Fluticasone Furoate-Vilanterol (BREO ELLIPTA) 100-25 MCG/INH AEPB, Inhale 1 puff into the lungs daily., Disp: 60 each, Rfl: 5 .  GuaiFENesin (MUCINEX PO), Take by mouth as needed., Disp: , Rfl:  .  loratadine (CLARITIN) 10 MG tablet, Take 10 mg by mouth daily as needed for allergies., Disp: , Rfl:  .  sodium chloride (OCEAN) 0.65 % SOLN nasal spray, Place 1 spray into both nostrils daily as needed for congestion., Disp: , Rfl:  .  Tiotropium Bromide Monohydrate (SPIRIVA RESPIMAT) 2.5 MCG/ACT AERS, Inhale 2 puffs into the lungs daily., Disp: 1 Inhaler, Rfl: 5 .  vitamin E 400 UNIT capsule, Take 400 Units by mouth every morning. , Disp: , Rfl:  .  [DISCONTINUED] Calcium Carbonate-Vitamin D (CALCIUM + D PO), Take by mouth daily. 500+400  , Disp: , Rfl:     Review of Systems  As per history of present illness     Objective:   Physical Exam  Constitutional: She is oriented to person, place, and time. She appears well-developed and well-nourished. No distress.  HENT:  Head: Normocephalic and atraumatic.  Right Ear: External ear normal.  Left Ear: External ear normal.  Mouth/Throat: Oropharynx is clear and moist. No oropharyngeal exudate.  Eyes: Conjunctivae and EOM are normal. Pupils are equal, round, and reactive to light. Right eye exhibits no discharge. Left eye exhibits no discharge. No scleral icterus.  Neck: Normal range of motion. Neck supple. No JVD present. No tracheal deviation present. No thyromegaly present.  Cardiovascular: Normal rate, regular rhythm, normal heart sounds and intact distal pulses.  Exam reveals no gallop and no friction rub.   No murmur heard. Pulmonary/Chest: Effort normal and breath sounds normal. No respiratory distress. She has no wheezes. She has no rales. She exhibits no tenderness.  Abdominal: Soft. Bowel sounds are normal. She exhibits no distension and no mass. There is no tenderness. There is no rebound and no  guarding.  Musculoskeletal: Normal range of motion. She exhibits no edema or tenderness.  Lymphadenopathy:    She has no cervical adenopathy.  Neurological: She is alert and oriented to person, place, and time. She has normal reflexes. No cranial nerve deficit. She exhibits normal muscle tone. Coordination normal.  Skin: Skin is warm and dry. No rash noted. She is not diaphoretic. No erythema. No pallor.  Psychiatric: She has a normal mood and affect. Her behavior is normal. Judgment and thought content normal.  Vitals reviewed.   Filed Vitals:   05/21/15 1410  BP: 112/74  Pulse: 82  Height: '5\' 2"'$  (1.575 m)  Weight: 138 lb (62.596 kg)  SpO2: 94%         Assessment:       ICD-9-CM ICD-10-CM   1. Moderate COPD (chronic obstructive pulmonary disease), Sees Dr.  Chase Caller, Pulm 496 J44.9   2. Chronic sinusitis, unspecified location 473.9 J32.9   3. Squamous cell lung cancer, right (HCC) 162.9 C34.91        Plan:      #chronic rhinosinusitis - - continue nasal steroids   - encourage daily netti pott as well - if headache does not go away, call us back  #COPD  Gold stge 2 - glad you are over sept 2016 flare up  - stable.  -  breo daily -spiriva respimat 1 puff daily - flu shot 05/21/2015    #lung cancer surveillance  - through Dr Julien Nordmann; last CT Oct 2016 without cancer recurrence  #followup  - 6 months with CAT score - depending on progress might wean down therapy        Dr. Brand Males, M.D., [facility].C.P Pulmonary and Critical Care Medicine Staff Physician [facility] Pager: [phone], If no answer or between  15:00h - 7:00h: call [phone]  05/21/2015 2:28 PM

## 2015-05-26 ENCOUNTER — Encounter: Payer: Self-pay | Admitting: Internal Medicine

## 2015-05-26 ENCOUNTER — Telehealth: Payer: Self-pay | Admitting: Internal Medicine

## 2015-05-26 ENCOUNTER — Ambulatory Visit (HOSPITAL_BASED_OUTPATIENT_CLINIC_OR_DEPARTMENT_OTHER): Payer: Medicare Other | Admitting: Internal Medicine

## 2015-05-26 VITALS — BP 147/83 | HR 63 | Temp 98.0°F | Resp 20 | Ht 62.0 in | Wt 138.2 lb

## 2015-05-26 DIAGNOSIS — Z85118 Personal history of other malignant neoplasm of bronchus and lung: Secondary | ICD-10-CM | POA: Diagnosis not present

## 2015-05-26 DIAGNOSIS — C3491 Malignant neoplasm of unspecified part of right bronchus or lung: Secondary | ICD-10-CM

## 2015-05-26 NOTE — Progress Notes (Signed)
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   PRIOR THERAPY: Status post wedge resection of the right upper lobe lung nodule under the care of Dr. Roxan Hockey on 10/20/2011.   CURRENT THERAPY: Observation.  INTERVAL HISTORY: [name] 74 y.o. female returns to the clinic today for annual followup visit. The patient is feeling fine today with no specific complaints. She is recovering from upper respiratory infection and was treated with 2 courses of antibiotics as well as prednisone. She denied having any significant chest pain, shortness of breath, but has mild cough cough with no hemoptysis. She denied having any weight loss or night sweats. The patient had repeat CT scan of the chest performed recently and she is here for evaluation and discussion of her scan results.   MEDICAL HISTORY: Past Medical History  Diagnosis Date  . COPD (chronic obstructive pulmonary disease) ([place])   . Depression     hx ocd and anxiety in record as well  . Gout     last time 6-36yr ago   . Allergic rhinitis   . Macrocytosis   . Hypertension     takes Amlodipine nightly  . Hyperlipidemia     taking Flax Seed Oil and Fish Oil  . Hx of migraines 90's    after menopause HA stopped  . Arthritis     all over  . Osteopenia     takes Calcium and Vit D bid  . Liver cyst   . IBS (irritable bowel syndrome)   . Bilateral cataracts   . Hot flashes     takes Prozac daily  . Chicken pox   . Seasonal allergies   . History of blood transfusion   . Pneumonia     x 2 ;last time back in 1999  . [place]   . Anemia   . S/P right THA, AA - Dr. OAlvan Dame ortho 03/07/2012  . 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 Smoking 09/12/2011  . PONV (postoperative  nausea and vomiting)   . Migraine     MIGRAINES RESOLVED WITH MENOPAUSE  . Cholelithiasis 05/21/2014    On CT scan 2015     ALLERGIES:  is allergic to clindamycin/lincomycin; penicillins; spiriva; and sulfa antibiotics.  MEDICATIONS:  Current Outpatient Prescriptions  Medication Sig Dispense Refill  . albuterol (PROAIR HFA) 108 (90 BASE) MCG/ACT inhaler Inhale 2 puffs into the lungs every 6 (six) hours as needed for wheezing or shortness of breath. 1 Inhaler 1  . amLODipine (NORVASC) 5 MG tablet Take 1 tablet (5 mg total) by mouth at bedtime. 90 tablet 3  . calcium & magnesium carbonates (MYLANTA) 3096-283MG tablet Take 1 tablet by mouth daily.    . Cholecalciferol (VITAMIN D-3 PO) Take 1,200 mg by mouth every morning.    .Marland KitchenFLUoxetine (PROZAC) 20 MG capsule Take 1 capsule (20 mg total) by mouth 2 (two) times daily. 180 capsule 3  . fluticasone (FLONASE) 50 MCG/ACT nasal spray Place 2 sprays into both nostrils daily. 16 g 5  . Fluticasone Furoate-Vilanterol (BREO ELLIPTA) 100-25 MCG/INH AEPB Inhale 1 puff into the lungs daily. 60 each 5  . GuaiFENesin (MUCINEX PO) Take by mouth as needed.    . loratadine (CLARITIN) 10 MG tablet  Take 10 mg by mouth daily as needed for allergies.    . sodium chloride (OCEAN) 0.65 % SOLN nasal spray Place 1 spray into both nostrils daily as needed for congestion.    . Tiotropium Bromide Monohydrate (SPIRIVA RESPIMAT) 2.5 MCG/ACT AERS Inhale 2 puffs into the lungs daily. 1 Inhaler 5  . vitamin E 400 UNIT capsule Take 400 Units by mouth every morning.     . [DISCONTINUED] Calcium Carbonate-Vitamin D (CALCIUM + D PO) Take by mouth daily. 500+400      No current facility-administered medications for this visit.    SURGICAL HISTORY:  Past Surgical History  Procedure Laterality Date  . Inguinal hernia repair  2000  . Neuroplasty decompression median nerve at carpal tunnel    . Rotator cuff repair  2001    right  . Tonsillectomy  as a child    and adenoids    . Dilation and curettage of uterus      at age 32  . Cyst removed  >59yr ago    from left leg  . Carpal tunnel release  80's    right   . Total abdominal hysterectomy  2003  . Total hip arthroplasty  2011    left  . Colonoscopy    . Video bronchoscopy  10/20/2011    Procedure: VIDEO BRONCHOSCOPY;  Surgeon: EGrace Isaac MD;  [facility];  Service: Thoracic;  Laterality: N/A;  . Wedge resection  10-20-2011    rt lung  - for lung cancer  . Total hip arthroplasty  03/07/2012    Procedure: TOTAL HIP ARTHROPLASTY ANTERIOR APPROACH;  Surgeon: MMauri Pole MD;  Location: [facility];  Service: Orthopedics;  Laterality: Right;  . Total knee arthroplasty Right 01/02/2013    Procedure: RIGHT TOTAL KNEE ARTHROPLASTY;  Surgeon: MMauri Pole MD;  Location: [facility];  Service: Orthopedics;  Laterality: Right;  . Total knee arthroplasty Left 04/03/2013    Procedure: LEFT TOTAL KNEE ARTHROPLASTY;  Surgeon: MMauri Pole MD;  Location: [facility];  Service: Orthopedics;  Laterality: Left;  . Excisional total knee arthroplasty Right 06/03/2014    Procedure: RIGHT KNEE SCAR EXCISION;  Surgeon: MMauri Pole MD;  Location: [facility];  Service: Orthopedics;  Laterality: Right;    REVIEW OF SYSTEMS:  A comprehensive review of systems was negative except for: Respiratory: positive for cough   PHYSICAL EXAMINATION: General appearance: alert, cooperative and no distress Head: Normocephalic, without obvious abnormality, atraumatic Neck: no adenopathy, no JVD, supple, symmetrical, trachea midline and thyroid not enlarged, symmetric, no tenderness/mass/nodules Lymph nodes: Cervical, supraclavicular, and axillary nodes normal. Resp: clear to auscultation bilaterally Cardio: regular rate and rhythm, S1, S2 normal, no murmur, click, rub or gallop GI: soft, non-tender; bowel sounds normal; no masses,  no organomegaly Extremities: extremities normal, atraumatic, no cyanosis or edema  ECOG PERFORMANCE STATUS: 1  - Symptomatic but completely ambulatory  Blood pressure 147/83, pulse 63, temperature 98 F (36.7 C), temperature source Oral, resp. rate 20, height '5\' 2"'$  (1.575 m), weight 138 lb 3.2 oz (62.687 kg), SpO2 98 %.  LABORATORY DATA: Lab Results  Component Value Date   WBC 5.6 05/21/2015   HGB 14.6 05/21/2015   HCT 42.8 05/21/2015   MCV 94.7 05/21/2015   PLT 170 05/21/2015      Chemistry      Component Value Date/Time   NA 140 05/21/2015 0909   NA 133* 06/04/2014 0430   K 4.0 05/21/2015 0909   K 3.8  06/04/2014 0430   CL 97 06/04/2014 0430   CL 103 11/13/2012 0939   CO2 27 05/21/2015 0909   CO2 25 06/04/2014 0430   BUN 7.8 05/21/2015 0909   BUN 10 06/04/2014 0430   CREATININE 0.8 05/21/2015 0909   CREATININE 0.74 06/04/2014 0430      Component Value Date/Time   CALCIUM 9.2 05/21/2015 0909   CALCIUM 8.6 06/04/2014 0430   ALKPHOS 65 05/21/2015 0909   ALKPHOS 44 10/22/2011 0335   AST 14 05/21/2015 0909   AST 20 10/22/2011 0335   ALT 13 05/21/2015 0909   ALT 14 10/22/2011 0335   BILITOT 0.77 05/21/2015 0909   BILITOT 0.5 10/22/2011 0335       RADIOGRAPHIC STUDIES:  ASSESSMENT AND PLAN: This is a very pleasant 74 years old white female with history of stage IA non-small cell lung cancer, squamous cell carcinoma diagnosed in March of 2013 status post wedge resection of the right upper lobe and currently on observation. The patient is doing fine and she has no evidence for disease recurrence. She has been on observation for the last 3 years. I discussed the scan results with her and recommended for her to continue on observation with repeat CT scan of the chest in 1 year. She was advised to call immediately if she has any concerning symptoms in the interval.  The patient voices understanding of current disease status and treatment options and is in agreement with the current care plan.  All questions were answered. The patient knows to call the clinic with any problems,  questions or concerns. We can certainly see the patient much sooner if necessary.  Disclaimer: This note was dictated with voice recognition software. Similar sounding words can inadvertently be transcribed and may not be corrected upon review.

## 2015-05-26 NOTE — Telephone Encounter (Signed)
per pof to sch pt appt-gave pt copy of avs-adv [facility] will call to sch scan

## 2015-05-29 ENCOUNTER — Emergency Department (HOSPITAL_COMMUNITY): Payer: Medicare Other

## 2015-05-29 ENCOUNTER — Emergency Department (HOSPITAL_COMMUNITY)
Admission: EM | Admit: 2015-05-29 | Discharge: 2015-05-29 | Disposition: A | Discharge status: Home / Self Care | Payer: Medicare Other | Attending: Emergency Medicine | Admitting: Emergency Medicine

## 2015-05-29 ENCOUNTER — Encounter (HOSPITAL_COMMUNITY): Payer: Self-pay | Admitting: Emergency Medicine

## 2015-05-29 DIAGNOSIS — E785 Hyperlipidemia, unspecified: Secondary | ICD-10-CM | POA: Insufficient documentation

## 2015-05-29 DIAGNOSIS — Z880 Allergy status to penicillin: Secondary | ICD-10-CM | POA: Insufficient documentation

## 2015-05-29 DIAGNOSIS — M7989 Other specified soft tissue disorders: Secondary | ICD-10-CM | POA: Diagnosis not present

## 2015-05-29 DIAGNOSIS — Z862 Personal history of diseases of the blood and blood-forming organs and certain disorders involving the immune mechanism: Secondary | ICD-10-CM | POA: Insufficient documentation

## 2015-05-29 DIAGNOSIS — M19041 Primary osteoarthritis, right hand: Secondary | ICD-10-CM | POA: Diagnosis not present

## 2015-05-29 DIAGNOSIS — F329 Major depressive disorder, single episode, unspecified: Secondary | ICD-10-CM | POA: Insufficient documentation

## 2015-05-29 DIAGNOSIS — Z79899 Other long term (current) drug therapy: Secondary | ICD-10-CM | POA: Insufficient documentation

## 2015-05-29 DIAGNOSIS — Z7951 Long term (current) use of inhaled steroids: Secondary | ICD-10-CM | POA: Diagnosis not present

## 2015-05-29 DIAGNOSIS — I10 Essential (primary) hypertension: Secondary | ICD-10-CM | POA: Diagnosis not present

## 2015-05-29 DIAGNOSIS — Z8719 Personal history of other diseases of the digestive system: Secondary | ICD-10-CM | POA: Insufficient documentation

## 2015-05-29 DIAGNOSIS — M79641 Pain in right hand: Secondary | ICD-10-CM

## 2015-05-29 DIAGNOSIS — M8580 Other specified disorders of bone density and structure, unspecified site: Secondary | ICD-10-CM | POA: Diagnosis not present

## 2015-05-29 DIAGNOSIS — Z87891 Personal history of nicotine dependence: Secondary | ICD-10-CM | POA: Insufficient documentation

## 2015-05-29 DIAGNOSIS — Z85118 Personal history of other malignant neoplasm of bronchus and lung: Secondary | ICD-10-CM | POA: Diagnosis not present

## 2015-05-29 DIAGNOSIS — H269 Unspecified cataract: Secondary | ICD-10-CM | POA: Diagnosis not present

## 2015-05-29 DIAGNOSIS — Z8619 Personal history of other infectious and parasitic diseases: Secondary | ICD-10-CM | POA: Diagnosis not present

## 2015-05-29 DIAGNOSIS — J449 Chronic obstructive pulmonary disease, unspecified: Secondary | ICD-10-CM | POA: Diagnosis not present

## 2015-05-29 DIAGNOSIS — Z8701 Personal history of pneumonia (recurrent): Secondary | ICD-10-CM | POA: Insufficient documentation

## 2015-05-29 MED ORDER — HYDROCODONE-ACETAMINOPHEN 5-325 MG PO TABS
1.0000 | ORAL_TABLET | Freq: Once | ORAL | Status: AC
  Administered 2015-05-29: 1 via ORAL
  Filled 2015-05-29: qty 1

## 2015-05-29 MED ORDER — IBUPROFEN 800 MG PO TABS: 800.0000 mg | ORAL_TABLET | Freq: Three times a day (TID) | ORAL | Status: DC

## 2015-05-29 NOTE — ED Provider Notes (Signed)
CSN: [id number]     Arrival date & time 05/29/15  1106 History   First MD Initiated Contact with Patient 05/29/15 1111     Chief Complaint  Patient presents with  . Hand Pain     (Consider location/radiation/quality/duration/timing/severity/associated sxs/prior Treatment) HPI Comments: Patient is a 74 year old female who presents to the ED with complaint of right hand pain. She notes she woke up Tuesday morning and noticed mild swelling and pain to the top of her right hand. Denies any recent fall, trauma, injury. Denies fever, chills, numbness, tingling, weakness, rash. She states she had similar swelling and pain a few months ago, was seen at an urgent care and was given pain meds. She notes the pain resolved and then a few weeks later she again started to have pain in the same area. She notes the pain went away without any treatment. Patient reports her swelling today is worse than initial onset a few months ago. Endorses history of osteoarthritis. She notes she has been taking tramadol at home with mild relief.  Patient is a 74 y.o. female presenting with hand pain.  Hand Pain Associated symptoms include arthralgias and joint swelling. Pertinent negatives include no chills, fever, numbness or weakness.    Past Medical History  Diagnosis Date  . COPD (chronic obstructive pulmonary disease) ([place])   . Depression     hx ocd and anxiety in record as well  . Gout     last time 6-9yr ago   . Allergic rhinitis   . Macrocytosis   . Hypertension     takes Amlodipine nightly  . Hyperlipidemia     taking Flax Seed Oil and Fish Oil  . Hx of migraines 90's    after menopause HA stopped  . Arthritis     all over  . Osteopenia     takes Calcium and Vit D bid  . Liver cyst   . IBS (irritable bowel syndrome)   . Bilateral cataracts   . Hot flashes     takes Prozac daily  . Chicken pox   . Seasonal allergies   . History of blood transfusion   . Pneumonia     x 2 ;last time back in  1999  . Lung cancer ([place]   . Anemia   . S/P right THA, AA - Dr. OAlvan Dame ortho 03/07/2012  . 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 Smoking 09/12/2011  . PONV (postoperative nausea and vomiting)   . Migraine     MIGRAINES RESOLVED WITH MENOPAUSE  . Cholelithiasis 05/21/2014    On CT scan 2015    Past Surgical History  Procedure Laterality Date  . Inguinal hernia repair  2000  . Neuroplasty decompression median nerve at carpal tunnel    . Rotator cuff repair  2001    right  . Tonsillectomy  as a child    and adenoids  . Dilation and curettage of uterus      at age 74 . Cyst removed  >89yrago    from left leg  . Carpal tunnel release  80's    right   . Total abdominal hysterectomy  2003  . Total hip arthroplasty  2011    left  . Colonoscopy    . Video bronchoscopy  10/20/2011    Procedure: VIDEO BRONCHOSCOPY;  Surgeon: EdGrace IsaacMD;  [place] [facility] Service: Thoracic;  Laterality: N/A;  . Wedge resection  10-20-2011    rt lung  - for  lung cancer  . Total hip arthroplasty  03/07/2012    Procedure: TOTAL HIP ARTHROPLASTY ANTERIOR APPROACH;  Surgeon: [name], MD;  Location: [facility];  Service: Orthopedics;  Laterality: Right;  . Total knee arthroplasty Right 01/02/2013    Procedure: RIGHT TOTAL KNEE ARTHROPLASTY;  Surgeon: [name], MD;  Location: [facility];  Service: Orthopedics;  Laterality: Right;  . Total knee arthroplasty Left 04/03/2013    Procedure: LEFT TOTAL KNEE ARTHROPLASTY;  Surgeon: [name], MD;  Location: [facility];  Service: Orthopedics;  Laterality: Left;  . Excisional total knee arthroplasty Right 06/03/2014    Procedure: RIGHT KNEE SCAR EXCISION;  Surgeon: [name], MD;  Location: [facility];  Service: Orthopedics;  Laterality: Right;   Family History  Problem Relation Age of Onset  . Alcohol abuse    . Depression    . Hearing loss    . Heart disease    . Hypertension    . Anesthesia problems Neg Hx   . Hypotension Neg Hx   . Malignant  hyperthermia Neg Hx   . Pseudochol deficiency Neg Hx   . Hypertension Brother    Social History  Substance Use Topics  . Smoking status: Former Smoker -- 0.50 packs/day for 50 years    Types: Cigarettes    Quit date: 09/23/2011  . Smokeless tobacco: Never Used  . Alcohol Use: 0.0 oz/week     Comment: 2 beers every night   OB History    No data available     Review of Systems  Constitutional: Negative for fever and chills.  Musculoskeletal: Positive for joint swelling and arthralgias.  Skin: Negative for wound.  Neurological: Negative for weakness and numbness.      Allergies  Clindamycin/lincomycin; Penicillins; Spiriva; and Sulfa antibiotics  Home Medications   Prior to Admission medications   Medication Sig Start Date End Date Taking? Authorizing Provider  albuterol (PROAIR HFA) 108 (90 BASE) MCG/ACT inhaler Inhale 2 puffs into the lungs every 6 (six) hours as needed for wheezing or shortness of breath. 11/14/13   Brand Males, MD  amLODipine (NORVASC) 5 MG tablet Take 1 tablet (5 mg total) by mouth at bedtime. 09/20/14   [name], DO  calcium & magnesium carbonates (MYLANTA) 449-675 MG tablet Take 1 tablet by mouth daily.    Historical Provider, MD  Cholecalciferol (VITAMIN D-3 PO) Take 1,200 mg by mouth every morning.    Historical Provider, MD  FLUoxetine (PROZAC) 20 MG capsule Take 1 capsule (20 mg total) by mouth 2 (two) times daily. 09/20/14   [name], DO  fluticasone (FLONASE) 50 MCG/ACT nasal spray Place 2 sprays into both nostrils daily. 11/22/14   Brand Males, MD  Fluticasone Furoate-Vilanterol (BREO ELLIPTA) 100-25 MCG/INH AEPB Inhale 1 puff into the lungs daily. 11/29/14   Brand Males, MD  GuaiFENesin (MUCINEX PO) Take by mouth as needed.    Historical Provider, MD  loratadine (CLARITIN) 10 MG tablet Take 10 mg by mouth daily as needed for allergies.    Historical Provider, MD  sodium chloride (OCEAN) 0.65 % SOLN nasal spray Place 1 spray into  both nostrils daily as needed for congestion.    Historical Provider, MD  Tiotropium Bromide Monohydrate (SPIRIVA RESPIMAT) 2.5 MCG/ACT AERS Inhale 2 puffs into the lungs daily. 11/22/14   Brand Males, MD  vitamin E 400 UNIT capsule Take 400 Units by mouth every morning.     Historical Provider, MD   BP 116/81 mmHg  Pulse 74  Temp(Src) 97.9 F (36.6 C) (Oral)  Resp 16  SpO2 100% Physical Exam  Constitutional: She is oriented to person, place, and time. She appears well-developed and well-nourished.  HENT:  Head: Normocephalic and atraumatic.  Eyes: Conjunctivae and EOM are normal. Right eye exhibits no discharge. Left eye exhibits no discharge. No scleral icterus.  Neck: Normal range of motion. Neck supple.  Cardiovascular: Normal rate.   Pulmonary/Chest: Effort normal. No respiratory distress.  Musculoskeletal: Normal range of motion. She exhibits edema and tenderness.       Right hand: She exhibits tenderness, bony tenderness and swelling. She exhibits normal range of motion, normal capillary refill, no deformity and no laceration. Normal sensation noted. Normal strength noted.       Hands: Neurological: She is alert and oriented to person, place, and time.  Skin: Skin is warm and dry.  Nursing note and vitals reviewed.   ED Course  Procedures (including critical care time) Labs Review Labs Reviewed - No data to display  Imaging Review No results found. I have personally reviewed and evaluated these images and lab results as part of my medical decision-making.  Filed Vitals:   05/29/15 1112  BP: 116/81  Pulse: 74  Temp: 97.9 F (36.6 C)  Resp: 16     MDM   Final diagnoses:  Right hand pain  Osteoarthritis of right hand, unspecified osteoarthritis type    Patient presents with right hand pain with swelling. No numbness, tingling, weakness. Denies any recent fall, trauma, injury. Reports having similar symptoms a few months ago. History of osteoarthritis. VSS.  Exam revealed mild swelling to the right dorsal hand between the first and second proximal metacarpals, mildly TTP, no erythema, no warmth, no cyst or deformities palpated. Full passive and active range of motion of right hand and digits. Patient able to form a fist, equal grip strength. Right hand neurovascularly intact. Patient given pain meds and ice in the ED. Right hand x-ray revealed malaligned laminate consistent with dorsal intercalated segmental instability, osteopenia and diffuse osteoarthritis. No evidence of septic joint. I suspect patient's symptoms are likely due to to chronic osteoarthritis. Plan to discharge patient home with NSAIDs. Advised patient to follow up with her orthopedist at her scheduled appointment and to continue using ice and her hand brace for pain relief.  Evaluation does not show pathology requring ongoing emergent intervention or admission. Pt is hemodynamically stable and mentating appropriately. Discussed findings/results and plan with patient/guardian, who agrees with plan. All questions answered. Return precautions discussed and outpatient follow up given.      Chesley Noon [place], [place] 05/29/15 [place], MD 05/30/15 [phone]

## 2015-05-29 NOTE — Discharge Instructions (Signed)
Take your medications as prescribed. You may continue using ice and your hand brace for pain relief. Follow-up with your orthopedist at your scheduled appointment. Return to the emergency department if symptoms worsen or new onset of fever, decreased range of motion, weakness, numbness, tingling.

## 2015-05-29 NOTE — ED Notes (Signed)
Per pt, states bump on top of right hand-noticed it on Tuesday-states same thing happened twice in past-no injury

## 2015-06-24 DIAGNOSIS — M19041 Primary osteoarthritis, right hand: Secondary | ICD-10-CM | POA: Diagnosis not present

## 2015-08-19 ENCOUNTER — Ambulatory Visit (INDEPENDENT_AMBULATORY_CARE_PROVIDER_SITE_OTHER): Payer: Medicare Other | Admitting: Internal Medicine

## 2015-08-19 ENCOUNTER — Encounter: Payer: Self-pay | Admitting: Internal Medicine

## 2015-08-19 VITALS — BP 120/82 | HR 75 | Ht 62.0 in | Wt 143.8 lb

## 2015-08-19 DIAGNOSIS — J449 Chronic obstructive pulmonary disease, unspecified: Secondary | ICD-10-CM

## 2015-08-19 DIAGNOSIS — R5383 Other fatigue: Secondary | ICD-10-CM

## 2015-08-19 NOTE — Patient Instructions (Addendum)
ICD-9-CM ICD-10-CM   1. Moderate COPD (chronic obstructive pulmonary disease), Sees Dr. Chase Caller, Pulm 496 J44.9 Pulmonary Function Test  2. Other fatigue 780.79 R53.83     Please do full pulmonary function test Continue Spiriva and Brio for now Return to see my nurse practitioner Patricia Nettle or myself after the pulmonary function test in the next few weeks  Consider pulmonary rehabilitation as finances would allow

## 2015-08-19 NOTE — Progress Notes (Signed)
Subjective:     Patient ID: [name], female   DOB: January 29, 1941, 75 y.o.   MRN: [id number]  HPI  PMD is Dr Blima Singer, [facility].  #Known spring allergies  #IBS, OA - knees and hip and does regular water exercises.   #Drinks 2 coors lite each night  -CAGE -negative early 2013.   #Ex-Smoker - 25 pack-year smoking history. Quit 2013 after diagnosis of lung cancer.  #COPD-MM genotype - 2013: Pre RUL lobectomy and Pre Rx: fev1 1.1L/60% , DLCO 9.6/56%, TLC 5.6/128%  - 2013 May: Post lobectomy and post Rx: fev1: 1.28L/60%, ratio 49 . No desat walk test - Rx spiriva, symbicort since spring 2013 - 2014 Nov: 06/22/2013. FEV1 is 1.3 L/62%. No post bronchodilator change. FVC is 2.6 L postop day later which is 95%. Ratio is 50. Total lung capacity is 4.96L/104%. DLCO is level 0.7/54%. - On synmbicort alone 04/05/2014 - Walk test today 185 feet x 3 laps on RA: Desat to < 88% at 2 laps - Change to [facility] plus Spiriva Respimat April 2016  # Stage 1 lung cancer: pT1b, pN0 Stage 08 October 2011 - s/p lobectomy by Dr Servando Snare - surveillance by Dr Earlie Server - 05/15/13 - Clear CT. No recurrence - October 2015 - clear CT,No recurrence           OV 05/21/2015  Chief Complaint  Patient presents with  . Follow-up    pt following for COPD. pt states she was in a few weeks ago for a bad bug with a cough. pt states the cough is better. pt states breathing has improved, no c/o SOB or ches tightness.     Follow-upfollow-up  Moderate COPD: In August/September 2016 despite my advice she went to [place] and went up and out acute of 7500 feet. She said she did not have any problems but on the way back in the plane she picked up COPD exacerbation. She's had for 5 week she was in bed and miserable with chronic cough. She had to be treated with 2 rounds of antibiotics and prednisone. She was in fact seen by Dr. Nicki Reaper in our office 04/21/2015  which notes are reviewed. Since then she has bounced back and is at baseline and is feeling great. She is on Spiriva and Symbicort. She is asking about innogent oxygen system for exertion. She'll have the flu shot today  Chronic sinus drainage: She is compliant with nasal steroid and saline nasal wash. Reports that with exacerbation she has had sinus headache that is still persistent for over a month but is improving steadily. She does not want any further investigation.  Lung cancer surveillance: She had CT scan of the chest today without contrast. Personally visualized the film. No evidence of recurrence or nodules or pneumonia and I agree with the final report    OV 08/19/2015  Chief Complaint  Patient presents with  . Acute Visit    Pt c/o weakness, increase in SOB, nonprod cough. Pt states she had a bad cold in August 2016 and was treated with pred and abx, pt states she has improved since then but her s/s have not completely resolved. Pt denies f/c/s, CP/tightness, chest congestion.    Follow-up moderate COPD. Last PFT was in November 2014. Since her exacerbation in August 2016 she continues to have persistent fatigue and dyspnea. She feels that she has had a new lower baseline. This is not resolving. It is moderate in intensity. She does not want to attend pulmonary  rehabilitation. This because of the high cost. There is not much of a cough. Most of the symptoms exertional shortness of breath and fatigue. Both of the score hand-in-hand. She is on Spiriva and Brio with which she is compliant. CT chest in October 2016 and was normal. Creatinine normal. Hemoglobin normal.   Current outpatient prescriptions:  .  albuterol (PROAIR HFA) 108 (90 BASE) MCG/ACT inhaler, Inhale 2 puffs into the lungs every 6 (six) hours as needed for wheezing or shortness of breath., Disp: 1 Inhaler, Rfl: 1 .  amLODipine (NORVASC) 5 MG tablet, Take 1 tablet (5 mg total) by mouth at bedtime., Disp: 90 tablet, Rfl:  3 .  calcium & magnesium carbonates (MYLANTA) 311-232 MG tablet, Take 1 tablet by mouth daily., Disp: , Rfl:  .  Cholecalciferol (VITAMIN D-3 PO), Take 1,200 mg by mouth every morning., Disp: , Rfl:  .  FLUoxetine (PROZAC) 20 MG capsule, Take 1 capsule (20 mg total) by mouth 2 (two) times daily., Disp: 180 capsule, Rfl: 3 .  fluticasone (FLONASE) 50 MCG/ACT nasal spray, Place 2 sprays into both nostrils daily., Disp: 16 g, Rfl: 5 .  Fluticasone Furoate-Vilanterol (BREO ELLIPTA) 100-25 MCG/INH AEPB, Inhale 1 puff into the lungs daily., Disp: 60 each, Rfl: 5 .  GuaiFENesin (MUCINEX PO), Take by mouth as needed., Disp: , Rfl:  .  ibuprofen (ADVIL,MOTRIN) 800 MG tablet, Take 1 tablet (800 mg total) by mouth 3 (three) times daily., Disp: 21 tablet, Rfl: 0 .  loratadine (CLARITIN) 10 MG tablet, Take 10 mg by mouth daily as needed for allergies., Disp: , Rfl:  .  Probiotic Product (PROBIOTIC ADVANCED PO), Take by mouth daily., Disp: , Rfl:  .  sodium chloride (OCEAN) 0.65 % SOLN nasal spray, Place 1 spray into both nostrils daily as needed for congestion., Disp: , Rfl:  .  Tiotropium Bromide Monohydrate (SPIRIVA RESPIMAT) 2.5 MCG/ACT AERS, Inhale 2 puffs into the lungs daily., Disp: 1 Inhaler, Rfl: 5 .  vitamin C (ASCORBIC ACID) 500 MG tablet, Take 500 mg by mouth daily., Disp: , Rfl:  .  vitamin E 400 UNIT capsule, Take 400 Units by mouth every morning. , Disp: , Rfl:  .  [DISCONTINUED] Calcium Carbonate-Vitamin D (CALCIUM + D PO), Take by mouth daily. 500+400 , Disp: , Rfl:    Allergies  Allergen Reactions  . Clindamycin/Lincomycin Nausea And Vomiting  . Penicillins Rash       . Spiriva [Tiotropium Bromide Monohydrate]     General intolerance  . Sulfa Antibiotics Hives and Rash    Severe Hives with skin reaction    Immunization History  Administered Date(s) Administered  . Influenza Split 06/02/2011, 05/09/2012  . Influenza,inj,Quad PF,36+ Mos 05/18/2013, 04/17/2014, 05/21/2015  .  Pneumococcal Conjugate-13 04/05/2014  . Pneumococcal Polysaccharide-23 08/09/1998, 09/21/2011  . Tdap 06/22/2012     Review of Systems Per hpi    Objective:   Physical Exam  Constitutional: She is oriented to person, place, and time. She appears well-developed and well-nourished. No distress.  HENT:  Head: Normocephalic and atraumatic.  Right Ear: External ear normal.  Left Ear: External ear normal.  Mouth/Throat: Oropharynx is clear and moist. No oropharyngeal exudate.  Eyes: Conjunctivae and EOM are normal. Pupils are equal, round, and reactive to light. Right eye exhibits no discharge. Left eye exhibits no discharge. No scleral icterus.  Neck: Normal range of motion. Neck supple. No JVD present. No tracheal deviation present. No thyromegaly present.  Cardiovascular: Normal rate, regular rhythm, normal heart sounds  and intact distal pulses.  Exam reveals no gallop and no friction rub.   No murmur heard. Pulmonary/Chest: Effort normal and breath sounds normal. No respiratory distress. She has no wheezes. She has no rales. She exhibits no tenderness.  Abdominal: Soft. Bowel sounds are normal. She exhibits no distension and no mass. There is no tenderness. There is no rebound and no guarding.  Musculoskeletal: Normal range of motion. She exhibits no edema or tenderness.  Lymphadenopathy:    She has no cervical adenopathy.  Neurological: She is alert and oriented to person, place, and time. She has normal reflexes. No cranial nerve deficit. She exhibits normal muscle tone. Coordination normal.  Skin: Skin is warm and dry. No rash noted. She is not diaphoretic. No erythema. No pallor.  Psychiatric: She has a normal mood and affect. Her behavior is normal. Judgment and thought content normal.  Vitals reviewed.   Filed Vitals:   08/19/15 1412  BP: 120/82  Pulse: 75  Height: '5\' 2"'$  (1.575 m)  Weight: 143 lb 12.8 oz (65.227 kg)  SpO2: 95%        Assessment:       ICD-9-CM  ICD-10-CM   1. Moderate COPD (chronic obstructive pulmonary disease), Sees Dr. Chase Caller, Pulm 496 J44.9 Pulmonary Function Test  2. Other fatigue 780.79 R53.83     Most likely differential diagnosis is worsening lung function following "hit and run" COPD exacerbation and she reset into a new baseline. The other differential diagnosis is physical deconditioning. Following exacerbation. There is no evidence of lung cancer recurrence based on October 2006 seen CT chest. Kidney function and hemoglobin are normal October 2016 is personally reviewed.     Plan:     Recommended pulmonary rehabilitation but she cannot afford this. She is doing some swimming aerobic exercises at [facility] and she will continue this.  She is agreed for full pulmonary function test to reassess current poor function severity. Last PFT was 2 years ago. She is already on triple therapies are not sure what else we can add perhaps other than changing MDIs around  Dr. Brand Males, M.D., [facility].C.P Pulmonary and Critical Care Medicine Staff Physician [facility] Pager: [phone], If no answer or between  15:00h - 7:00h: call [phone]  08/19/2015 2:41 PM

## 2015-08-25 ENCOUNTER — Other Ambulatory Visit: Payer: Self-pay | Admitting: Internal Medicine

## 2015-09-03 ENCOUNTER — Other Ambulatory Visit: Payer: Self-pay

## 2015-09-03 DIAGNOSIS — Z1231 Encounter for screening mammogram for malignant neoplasm of breast: Secondary | ICD-10-CM

## 2015-09-10 ENCOUNTER — Encounter: Payer: Self-pay | Admitting: Adult Health

## 2015-09-10 ENCOUNTER — Ambulatory Visit (INDEPENDENT_AMBULATORY_CARE_PROVIDER_SITE_OTHER): Payer: Medicare Other | Admitting: Adult Health

## 2015-09-10 ENCOUNTER — Ambulatory Visit (INDEPENDENT_AMBULATORY_CARE_PROVIDER_SITE_OTHER): Payer: Medicare Other | Admitting: Internal Medicine

## 2015-09-10 VITALS — BP 116/78

## 2015-09-10 DIAGNOSIS — J449 Chronic obstructive pulmonary disease, unspecified: Secondary | ICD-10-CM

## 2015-09-10 DIAGNOSIS — C3490 Malignant neoplasm of unspecified part of unspecified bronchus or lung: Secondary | ICD-10-CM

## 2015-09-10 LAB — PULMONARY FUNCTION TEST
DL/VA % PRED: 56 %
DL/VA: 2.58 ml/min/mmHg/L
DLCO unc % pred: 43 %
DLCO unc: 9.44 ml/min/mmHg
FEF 25-75 POST: 0.45 L/s
FEF 25-75 Pre: 0.45 L/sec
FEF2575-%CHANGE-POST: -1 %
FEF2575-%PRED-POST: 26 %
FEF2575-%PRED-PRE: 26 %
FEV1-%Change-Post: -1 %
FEV1-%Pred-Post: 55 %
FEV1-%Pred-Pre: 56 %
FEV1-PRE: 1.14 L
FEV1-Post: 1.12 L
FEV1FVC-%CHANGE-POST: 0 %
FEV1FVC-%PRED-PRE: 65 %
FEV6-%Change-Post: 0 %
FEV6-%PRED-PRE: 86 %
FEV6-%Pred-Post: 85 %
FEV6-Post: 2.19 L
FEV6-Pre: 2.2 L
FEV6FVC-%Change-Post: 1 %
FEV6FVC-%Pred-Post: 100 %
FEV6FVC-%Pred-Pre: 98 %
FVC-%CHANGE-POST: -2 %
FVC-%PRED-PRE: 87 %
FVC-%Pred-Post: 84 %
FVC-POST: 2.27 L
FVC-PRE: 2.33 L
PRE FEV1/FVC RATIO: 49 %
Post FEV1/FVC ratio: 49 %
Post FEV6/FVC ratio: 96 %
Pre FEV6/FVC Ratio: 95 %
RV % pred: 143 %
RV: 3.05 L
TLC % pred: 115 %
TLC: 5.46 L

## 2015-09-10 MED ORDER — ALBUTEROL SULFATE HFA 108 (90 BASE) MCG/ACT IN AERS: 2.0000 | INHALATION_SPRAY | Freq: Four times a day (QID) | RESPIRATORY_TRACT | Status: DC | PRN

## 2015-09-10 NOTE — Progress Notes (Signed)
Subjective:    Patient ID: [name], female    DOB: 1941/02/17, 75 y.o.   MRN: [id number]  HPI 75 yo female former smoker with COPD , Lung cancer   TEST   #COPD-MM genotype - 2013: Pre RUL lobectomy and Pre Rx: fev1 1.1L/60% , DLCO 9.6/56%, TLC 5.6/128%  - 2013 May: Post lobectomy and post Rx: fev1: 1.28L/60%, ratio 49 . No desat walk test - Rx spiriva, symbicort since spring 2013 - 2014 Nov: 06/22/2013. FEV1 is 1.3 L/62%. No post bronchodilator change. FVC is 2.6 L postop day later which is 95%. Ratio is 50. Total lung capacity is 4.96L/104%. DLCO is level 0.7/54%. - On synmbicort alone 04/05/2014 - Walk test today 185 feet x 3 laps on RA: Desat to < 88% at 2 laps - Change to [facility] plus Spiriva Respimat April 2016  # Stage 1 lung cancer: pT1b, pN0 Stage 08 October 2011 - s/p lobectomy by Dr Servando Snare - surveillance by Dr Earlie Server - 05/15/13 - Clear CT. No recurrence - October 2015 - clear CT,No recurrence  -Oct 2016 >no recurrence   09/11/2015 Follow up : COPD  Pt returns for 4 weeks follow up for COPD  PFT done today were reviewed with pt and showed  FEV 1 56%, ratio 49 , FVC 87%,  DLCO 43%  This is down slightly from 2014 (FEV1 62%)  CT chest in Oct 2016 did show mod emphysema .  She remains on Spiriva and BREO .  She has tried Symbicort and Dulera in past.  She says she does get winded with activity -but tried to stay active.  Has minimal cough . Sometimes has some wheezing and sinus drainge.  Denies chest pain, orthopnea, edema or hemoptysis .   Most recent CT chest Oct 2016 survelliance showed no cancer recurrence . Denies any chest pain, orthopnea, PND, leg swelling or hemoptysis.  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 She has questions regarding her prognosis .  Worried she will not be able to help him. "I need to out live him and be able to care for him"  She remains active with Swimming at [employer] and is part of the  :"[employer]. "    Past Medical History  Diagnosis Date  . COPD (chronic obstructive pulmonary disease) ([place])   . Depression     hx ocd and anxiety in record as well  . Gout     last time 6-33yr ago   . Allergic rhinitis   . Macrocytosis   . Hypertension     takes Amlodipine nightly  . Hyperlipidemia     taking Flax Seed Oil and Fish Oil  . Hx of migraines 90's    after menopause HA stopped  . Arthritis     all over  . Osteopenia     takes Calcium and Vit D bid  . Liver cyst   . IBS (irritable bowel syndrome)   . Bilateral cataracts   . Hot flashes     takes Prozac daily  . Chicken pox   . Seasonal allergies   . History of blood transfusion   . Pneumonia     x 2 ;last time back in 1999  . [place]   . Anemia   . S/P right THA, AA - Dr. OAlvan Dame ortho 03/07/2012  . 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 Smoking 09/12/2011  . PONV (postoperative nausea and vomiting)   . Migraine     MIGRAINES RESOLVED WITH  MENOPAUSE  . Cholelithiasis 05/21/2014    On CT scan 2015    Current Outpatient Prescriptions on File Prior to Visit  Medication Sig Dispense Refill  . amLODipine (NORVASC) 5 MG tablet Take 1 tablet (5 mg total) by mouth at bedtime. 90 tablet 3  . BREO ELLIPTA 100-25 MCG/INH AEPB INHALE ONE PUFF BY MOUTH ONCE DAILY 60 each 12  . Cholecalciferol (VITAMIN D-3 PO) Take 1,200 mg by mouth every morning.    Marland Kitchen FLUoxetine (PROZAC) 20 MG capsule Take 1 capsule (20 mg total) by mouth 2 (two) times daily. 180 capsule 3  . fluticasone (FLONASE) 50 MCG/ACT nasal spray Place 2 sprays into both nostrils daily. 16 g 5  . GuaiFENesin (MUCINEX PO) Take by mouth as needed.    Marland Kitchen ibuprofen (ADVIL,MOTRIN) 800 MG tablet Take 1 tablet (800 mg total) by mouth 3 (three) times daily. 21 tablet 0  . Probiotic Product (PROBIOTIC ADVANCED PO) Take by mouth daily.    . sodium chloride (OCEAN) 0.65 % SOLN nasal spray Place 1 spray into both nostrils daily as needed for congestion.    . Tiotropium  Bromide Monohydrate (SPIRIVA RESPIMAT) 2.5 MCG/ACT AERS Inhale 2 puffs into the lungs daily. 1 Inhaler 5  . vitamin C (ASCORBIC ACID) 500 MG tablet Take 500 mg by mouth daily.    . [DISCONTINUED] Calcium Carbonate-Vitamin D (CALCIUM + D PO) Take by mouth daily. 500+400      No current facility-administered medications on file prior to visit.      Review of Systems Constitutional:   No  weight loss, night sweats,  Fevers, chills, + fatigue, or  lassitude.  HEENT:   No headaches,  Difficulty swallowing,  Tooth/dental problems, or  Sore throat,                No sneezing, itching, ear ache, nasal congestion +, post nasal drip,   CV:  No chest pain,  Orthopnea, PND, swelling in lower extremities, anasarca, dizziness, palpitations, syncope.   GI  No heartburn, indigestion, abdominal pain, nausea, vomiting, diarrhea, change in bowel habits, loss of appetite, bloody stools.   Resp:    No chest wall deformity  Skin: no rash or lesions.  GU: no dysuria, change in color of urine, no urgency or frequency.  No flank pain, no hematuria   MS:  No joint pain or swelling.  No decreased range of motion.  No back pain.  Psych:  No change in mood or affect. No depression or anxiety.  No memory loss.          Objective:   Physical Exam Filed Vitals:   09/10/15 1209  BP: 116/78     GEN: A/Ox3; pleasant , NAD,eldelry   HEENT:  [place]/AT,  EACs-clear, TMs-wnl, NOSE-clear, THROAT-clear, no lesions, no postnasal drip or exudate noted.   NECK:  Supple w/ fair ROM; no JVD; normal carotid impulses w/o bruits; no thyromegaly or nodules palpated; no lymphadenopathy.  RESP  Decreased BS in bases no accessory muscle use, no dullness to percussion  CARD:  RRR, no m/r/g  , no peripheral edema, pulses intact, no cyanosis or clubbing.  GI:   Soft & nt; nml bowel sounds; no organomegaly or masses detected.  Musco: Warm bil, no deformities or joint swelling noted.   Neuro: alert, no focal deficits  noted.    Skin: Warm, no lesions or rashes         Assessment & Plan:

## 2015-09-10 NOTE — Progress Notes (Signed)
PFT done today. 

## 2015-09-10 NOTE — Patient Instructions (Addendum)
Continue on [place] .  Activity as tolerated.  Follow up Dr. Chase Caller in 4 months and As needed

## 2015-09-11 NOTE — Assessment & Plan Note (Signed)
PFT do show mild decline in lung function  She is on good regimen , has tried several inhalers in past.  For now stay on BREO and Spiriva .  If COPD exacerbations pick up , consider changing  Encouraged on remaining active with swimming She can not afford pulm rehab  Plan  Continue on [place] and [place] .  Activity as tolerated.  Follow up Dr. Chase Caller in 4 months and As needed

## 2015-09-11 NOTE — Assessment & Plan Note (Signed)
No evidence of recurrence on CT chest 05/2015  Cont follow up serial CT and Oncology follow up

## 2015-09-17 ENCOUNTER — Ambulatory Visit
Admission: RE | Admit: 2015-09-17 | Discharge: 2015-09-17 | Disposition: A | Discharge status: Home / Self Care | Payer: Medicare Other | Source: Ambulatory Visit

## 2015-09-17 DIAGNOSIS — Z1231 Encounter for screening mammogram for malignant neoplasm of breast: Secondary | ICD-10-CM | POA: Diagnosis not present

## 2015-09-22 ENCOUNTER — Encounter: Payer: Self-pay | Admitting: Family Medicine

## 2015-09-22 ENCOUNTER — Ambulatory Visit (INDEPENDENT_AMBULATORY_CARE_PROVIDER_SITE_OTHER): Payer: Medicare Other | Admitting: Family Medicine

## 2015-09-22 VITALS — BP 118/80 | HR 90 | Temp 97.8°F | Ht 62.5 in | Wt 141.2 lb

## 2015-09-22 DIAGNOSIS — F329 Major depressive disorder, single episode, unspecified: Secondary | ICD-10-CM

## 2015-09-22 DIAGNOSIS — E2839 Other primary ovarian failure: Secondary | ICD-10-CM

## 2015-09-22 DIAGNOSIS — Z0000 Encounter for general adult medical examination without abnormal findings: Secondary | ICD-10-CM

## 2015-09-22 DIAGNOSIS — F32A Depression, unspecified: Secondary | ICD-10-CM

## 2015-09-22 DIAGNOSIS — J449 Chronic obstructive pulmonary disease, unspecified: Secondary | ICD-10-CM

## 2015-09-22 DIAGNOSIS — I159 Secondary hypertension, unspecified: Secondary | ICD-10-CM

## 2015-09-22 LAB — LIPID PANEL
CHOLESTEROL: 191 mg/dL (ref 0–200)
HDL: 53.4 mg/dL (ref >39.00)
LDL CALC: 116 mg/dL — AB (ref 0–99)
NonHDL: 137.2
TRIGLYCERIDES: 106 mg/dL (ref 0.0–149.0)
Total CHOL/HDL Ratio: 4
VLDL: 21.2 mg/dL (ref 0.0–40.0)

## 2015-09-22 LAB — HEMOGLOBIN A1C: HEMOGLOBIN A1C: 5.4 % (ref 4.6–6.5)

## 2015-09-22 MED ORDER — AMLODIPINE BESYLATE 5 MG PO TABS: 5.0000 mg | ORAL_TABLET | Freq: Every day | ORAL | Status: DC

## 2015-09-22 MED ORDER — FLUOXETINE HCL 20 MG PO CAPS: 20.0000 mg | ORAL_CAPSULE | Freq: Two times a day (BID) | ORAL | Status: DC

## 2015-09-22 NOTE — Progress Notes (Signed)
Pre visit review using our clinic review tool, if applicable. No additional management support is needed unless otherwise documented below in the visit note. 

## 2015-09-22 NOTE — Patient Instructions (Signed)
BEFORE YOU LEAVE: -schedule follow up in about 4 months -labs  -We have ordered labs or studies at this visit. It can take up to 1-2 weeks for results and processing. We will contact you with instructions IF your results are abnormal. Normal results will be released to your [facility]. If you have not heard from [place] or can not find your results in [facility] in 2 weeks please contact our office.  We recommend the following healthy lifestyle measures: - eat a healthy whole foods diet consisting of regular small meals composed of vegetables, fruits, beans, nuts, seeds, healthy meats such as white chicken and fish and whole grains.  - avoid sweets, white starchy foods, fried foods, fast food, processed foods, sodas, red meet and other fattening foods.  - get a least 150-300 minutes of aerobic exercise per week.   -We placed a referral for you as discussed for the bone density testing. It usually takes about 1-2 weeks to process and schedule this referral. If you have not heard from [place] regarding this appointment in 2 weeks please contact our office.

## 2015-09-22 NOTE — Progress Notes (Signed)
Medicare Annual Preventive Care Visit  (initial annual wellness or annual wellness exam)  Concerns and/or follow up today:  [name] is a pleasant 75 yo with PMH lung ca, COPD (sees pulm and onc), HTN, HLD, depression and sig OA (sees ortho) here for her medicare wellness visit.  Concerns and/or follow up today:  HTN: -meds: amlodipine 5 -stable -denies: CP, SOB, DOE  Depression and anxiety and hot flashes: -meds: prozac -reports: doing well, wants to continue -denies: SI, depression  COPD - moldy worsened on recent PFT - meds: Breo and spiriva per recent pulm notes  Hx stage 56A non-sm cell lung ca -observation w/out disease recurrence since 2013  Hx osteoporosis or osteopenia: -reports on fosomax in the past and willing to restart if needed -takes vit D, gets regular exercise  ROS: negative for report of fevers, unintentional weight loss, vision changes, vision loss, hearing loss or change, chest pain, sob, hemoptysis, melena, hematochezia, hematuria, genital discharge or lesions, falls, bleeding or bruising, loc, thoughts of suicide or self harm, memory loss  1.) Patient-completed health risk assessment  - completed and reviewed, see scanned documentation  2.) Review of Medical History: -PMH, PSH, Family History and current specialty and care providers reviewed and updated and listed below  - see scanned in document in chart and below  Past Medical History  Diagnosis Date  . COPD (chronic obstructive pulmonary disease) ([place])   . Depression     hx ocd and anxiety in record as well  . Gout     last time 6-34yr ago   . Allergic rhinitis   . Macrocytosis   . Hypertension     takes Amlodipine nightly  . Hyperlipidemia     taking Flax Seed Oil and Fish Oil  . Hx of migraines 90's    after menopause HA stopped  . Arthritis     all over  . Osteopenia     takes Calcium and Vit D bid  . Liver cyst   . IBS (irritable bowel syndrome)   . Bilateral cataracts    . Hot flashes     takes Prozac daily  . Chicken pox   . Seasonal allergies   . History of blood transfusion   . Pneumonia     x 2 ;last time back in 1999  . Lung cancer ([place]   . Anemia   . S/P right THA, AA - Dr. OAlvan Dame ortho 03/07/2012  . 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 Smoking 09/12/2011  . PONV (postoperative nausea and vomiting)   . Migraine     MIGRAINES RESOLVED WITH MENOPAUSE  . Cholelithiasis 05/21/2014    On CT scan 2015     Past Surgical History  Procedure Laterality Date  . Inguinal hernia repair  2000  . Neuroplasty decompression median nerve at carpal tunnel    . Rotator cuff repair  2001    right  . Tonsillectomy  as a child    and adenoids  . Dilation and curettage of uterus      at age 75 . Cyst removed  >89yrago    from left leg  . Carpal tunnel release  80's    right   . Total abdominal hysterectomy  2003  . Total hip arthroplasty  2011    left  . Colonoscopy    . Video bronchoscopy  10/20/2011    Procedure: VIDEO BRONCHOSCOPY;  Surgeon: EdGrace IsaacMD;  Location: [facility] Service: Thoracic;  Laterality: N/A;  . Wedge resection  10-20-2011    rt lung  - for lung cancer  . Total hip arthroplasty  03/07/2012    Procedure: TOTAL HIP ARTHROPLASTY ANTERIOR APPROACH;  Surgeon: [name], MD;  Location: [facility];  Service: Orthopedics;  Laterality: Right;  . Total knee arthroplasty Right 01/02/2013    Procedure: RIGHT TOTAL KNEE ARTHROPLASTY;  Surgeon: [name], MD;  Location: [facility];  Service: Orthopedics;  Laterality: Right;  . Total knee arthroplasty Left 04/03/2013    Procedure: LEFT TOTAL KNEE ARTHROPLASTY;  Surgeon: [name], MD;  Location: [facility];  Service: Orthopedics;  Laterality: Left;  . Excisional total knee arthroplasty Right 06/03/2014    Procedure: RIGHT KNEE SCAR EXCISION;  Surgeon: [name], MD;  Location: [facility];  Service: Orthopedics;  Laterality: Right;    Social History   Social History  . Marital Status: Married     Spouse Name: N/A  . Number of Children: N/A  . Years of Education: N/A   Occupational History  . retired    Social History Main Topics  . Smoking status: Former Smoker -- 0.50 packs/day for 50 years    Types: Cigarettes    Quit date: 09/23/2011  . Smokeless tobacco: Never Used  . Alcohol Use: 0.0 oz/week     Comment: 2 beers every night  . Drug Use: No  . Sexual Activity: No   Other Topics Concern  . Not on file   Social History Narrative    Family History  Problem Relation Age of Onset  . Alcohol abuse    . Depression    . Hearing loss    . Heart disease    . Hypertension    . Anesthesia problems Neg Hx   . Hypotension Neg Hx   . Malignant hyperthermia Neg Hx   . Pseudochol deficiency Neg Hx   . Hypertension Brother     Current Outpatient Prescriptions on File Prior to Visit  Medication Sig Dispense Refill  . albuterol (PROAIR HFA) 108 (90 Base) MCG/ACT inhaler Inhale 2 puffs into the lungs every 6 (six) hours as needed for wheezing or shortness of breath. 1 Inhaler 6  . BREO ELLIPTA 100-25 MCG/INH AEPB INHALE ONE PUFF BY MOUTH ONCE DAILY 60 each 12  . Calcium-Magnesium-Zinc 500-250-12.5 MG TABS Take 1 tablet by mouth daily.    . Cholecalciferol (VITAMIN D-3 PO) Take 1,200 mg by mouth every morning.    . fluticasone (FLONASE) 50 MCG/ACT nasal spray Place 2 sprays into both nostrils daily. 16 g 5  . GuaiFENesin (MUCINEX PO) Take by mouth as needed.    Marland Kitchen ibuprofen (ADVIL,MOTRIN) 800 MG tablet Take 1 tablet (800 mg total) by mouth 3 (three) times daily. 21 tablet 0  . Probiotic Product (PROBIOTIC ADVANCED PO) Take by mouth daily.    . sodium chloride (OCEAN) 0.65 % SOLN nasal spray Place 1 spray into both nostrils daily as needed for congestion.    . Tiotropium Bromide Monohydrate (SPIRIVA RESPIMAT) 2.5 MCG/ACT AERS Inhale 2 puffs into the lungs daily. 1 Inhaler 5  . vitamin C (ASCORBIC ACID) 500 MG tablet Take 500 mg by mouth daily.    . [DISCONTINUED] Calcium  Carbonate-Vitamin D (CALCIUM + D PO) Take by mouth daily. 500+400      No current facility-administered medications on file prior to visit.     3.) Review of functional ability and level of safety:  Any difficulty hearing?  NO  History  of falling?  NO  Any trouble with IADLs - using a phone, using transportation, grocery shopping, preparing meals, doing housework, doing laundry, taking medications and managing money? NO  Advance Directives? Yes  See summary of recommendations in Patient Instructions below.  4.) Physical Exam Filed Vitals:   09/22/15 0931  BP: 118/80  Pulse: 90  Temp: 97.8 F (36.6 C)   Estimated body mass index is 25.4 kg/(m^2) as calculated from the following:   Height as of this encounter: 5' 2.5" (1.588 m).   Weight as of this encounter: 141 lb 3.2 oz (64.048 kg).  EKG (optional): deferred  General: alert, appear well hydrated and in no acute distress  HEENT: visual acuity grossly intact  CV: HRRR, normal pedal pulses, no edema  Lungs: CTA bilaterally  Psych: pleasant and cooperative, no obvious depression or anxiety  Cognitive function grossly intact  See patient instructions for recommendations.  Education and counseling regarding the above review of health provided with a plan for the following: -see scanned patient completed form for further details -fall prevention strategies discussed  -healthy lifestyle discussed -importance and resources for completing advanced directives discussed -see patient instructions below for any other recommendations provided  4)The following written screening schedule of preventive measures were reviewed with assessment and plan made per below, orders and patient instructions:       Alcohol screening done     Obesity Screening and counseling done     STI screening (Hep C if born [birth year]) offered and per pt wishes     Tobacco Screening done done       Pneumococcal (PPSV23 -one dose after 64, one  before if risk factors), influenza yearly and hepatitis B vaccines (if high risk - end stage renal disease, IV drugs, homosexual men, live in home for mentally retarded, hemophilia receiving factors) ASSESSMENT/PLAN: done      Screening mammograph (yearly if >40) ASSESSMENT/PLAN: done 09/2015      Screening Pap smear/pelvic exam (q2 years) ASSESSMENT/PLAN: n/a, declined      Prostate cancer screening ASSESSMENT/PLAN: n/a, declined      Colorectal cancer screening (FOBT yearly or flex sig q4y or colonoscopy q10y or barium enema q4y) ASSESSMENT/PLAN: done, reports told 10 years in 2010      Diabetes outpatient self-management training services ASSESSMENT/PLAN: utd or done      Bone mass measurements(covered q2y if indicated - estrogen def, osteoporosis, hyperparathyroid, vertebral abnormalities, osteoporosis or steroids) ASSESSMENT/PLAN: 07/2013, wants to repeat      Screening for glaucoma(q1y if high risk - diabetes, FH, AA and > 50 or hispanic and > 65) ASSESSMENT/PLAN: utd  - sees ptho      Medical nutritional therapy for individuals with diabetes or renal disease ASSESSMENT/PLAN: see orders      Cardiovascular screening blood tests (lipids q5y) ASSESSMENT/PLAN: see orders and labs      Diabetes screening tests ASSESSMENT/PLAN: see orders and labs   7.) Summary:  Medicare annual wellness visit, subsequent - Plan: Lipid Panel, Hemoglobin A1c -risk factors and conditions per above assessment were discussed and treatment, recommendations and referrals were offered per documentation above and orders and patient instructions.  Secondary hypertension, unspecified -stable, medications refilled, reviewed labs  Depression -stable, medications refilled  Moderate COPD (chronic obstructive pulmonary disease), Sees Dr. Chase Caller, Pulm -notes reviewed -managed by pulm  Estrogen deficiency - Plan: DG Bone Density -order placed per pt preference  Patient Instructions  BEFORE YOU  LEAVE: -schedule follow up in about 4 months -labs  -  We have ordered labs or studies at this visit. It can take up to 1-2 weeks for results and processing. We will contact you with instructions IF your results are abnormal. Normal results will be released to your [facility]. If you have not heard from [place] or can not find your results in [facility] in 2 weeks please contact our office.  We recommend the following healthy lifestyle measures: - eat a healthy whole foods diet consisting of regular small meals composed of vegetables, fruits, beans, nuts, seeds, healthy meats such as white chicken and fish and whole grains.  - avoid sweets, white starchy foods, fried foods, fast food, processed foods, sodas, red meet and other fattening foods.  - get a least 150-300 minutes of aerobic exercise per week.   -We placed a referral for you as discussed for the bone density testing. It usually takes about 1-2 weeks to process and schedule this referral. If you have not heard from [place] regarding this appointment in 2 weeks please contact our office.

## 2015-10-08 ENCOUNTER — Encounter: Payer: Self-pay | Admitting: Family Medicine

## 2015-10-24 ENCOUNTER — Other Ambulatory Visit: Payer: Self-pay | Admitting: Internal Medicine

## 2015-10-30 ENCOUNTER — Ambulatory Visit
Admission: RE | Admit: 2015-10-30 | Discharge: 2015-10-30 | Disposition: A | Discharge status: Home / Self Care | Payer: Medicare Other | Source: Ambulatory Visit | Attending: Family Medicine | Admitting: Family Medicine

## 2015-10-30 DIAGNOSIS — E2839 Other primary ovarian failure: Secondary | ICD-10-CM

## 2015-10-30 DIAGNOSIS — Z780 Asymptomatic menopausal state: Secondary | ICD-10-CM | POA: Diagnosis not present

## 2015-10-30 DIAGNOSIS — M85832 Other specified disorders of bone density and structure, left forearm: Secondary | ICD-10-CM | POA: Diagnosis not present

## 2015-11-10 ENCOUNTER — Ambulatory Visit (INDEPENDENT_AMBULATORY_CARE_PROVIDER_SITE_OTHER): Payer: Medicare Other | Admitting: Family Medicine

## 2015-11-10 ENCOUNTER — Encounter: Payer: Self-pay | Admitting: Family Medicine

## 2015-11-10 VITALS — BP 110/82 | HR 77 | Temp 98.4°F | Ht 62.5 in | Wt 139.9 lb

## 2015-11-10 DIAGNOSIS — R6889 Other general symptoms and signs: Secondary | ICD-10-CM | POA: Diagnosis not present

## 2015-11-10 DIAGNOSIS — R251 Tremor, unspecified: Secondary | ICD-10-CM | POA: Diagnosis not present

## 2015-11-10 DIAGNOSIS — R5383 Other fatigue: Secondary | ICD-10-CM | POA: Diagnosis not present

## 2015-11-10 LAB — TSH: TSH: 2.8 u[IU]/mL (ref 0.35–4.50)

## 2015-11-10 NOTE — Progress Notes (Signed)
Pre visit review using our clinic review tool, if applicable. No additional management support is needed unless otherwise documented below in the visit note. 

## 2015-11-10 NOTE — Patient Instructions (Addendum)
Before you leave: -Labs to check thyroid level -Regular follow-up as scheduled  We recommend the following healthy lifestyle measures: - eat a healthy whole foods diet consisting of regular small meals composed of vegetables, fruits, beans, nuts, seeds, healthy meats such as white chicken and fish and whole grains.  - avoid sweets, white starchy foods, fried foods, fast food, processed foods, sodas, red meet and other fattening foods.  - get a least 150-300 minutes of aerobic exercise per week.   -We have ordered labs or studies at this visit. It can take up to 1-2 weeks for results and processing. We will contact you with instructions IF your results are abnormal. Normal results will be released to your [facility]. If you have not heard from [place] or can not find your results in [facility] in 2 weeks please contact our office.

## 2015-11-10 NOTE — Progress Notes (Signed)
HPI:  Acute visit for:  Tremor: -she is worried about parkinsons -Started several years ago -Hands shake as she goes to reach for something; they do not shake at rest -No stiff or slow gait, no pin rolling, no depressed or flattened affect, no tremor elsewhere, no weakness or numbness -She also feels tired and cold, this has always been the case -Wonders about her thyroid as well -Not interfering with activities   ROS: See pertinent positives and negatives per HPI.  Past Medical History  Diagnosis Date  . COPD (chronic obstructive pulmonary disease) ([place])   . Depression     hx ocd and anxiety in record as well  . Gout     last time 6-25yr ago   . Allergic rhinitis   . Macrocytosis   . Hypertension     takes Amlodipine nightly  . Hyperlipidemia     taking Flax Seed Oil and Fish Oil  . Hx of migraines 90's    after menopause HA stopped  . Arthritis     all over  . Osteopenia     takes Calcium and Vit D bid  . Liver cyst   . IBS (irritable bowel syndrome)   . Bilateral cataracts   . Hot flashes     takes Prozac daily  . Chicken pox   . Seasonal allergies   . History of blood transfusion   . Pneumonia     x 2 ;last time back in 1999  . [place]   . Anemia   . S/P right THA, AA - Dr. OAlvan Dame ortho 03/07/2012  . 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 Smoking 09/12/2011  . PONV (postoperative nausea and vomiting)   . Migraine     MIGRAINES RESOLVED WITH MENOPAUSE  . Cholelithiasis 05/21/2014    On CT scan 2015     Past Surgical History  Procedure Laterality Date  . Inguinal hernia repair  2000  . Neuroplasty decompression median nerve at carpal tunnel    . Rotator cuff repair  2001    right  . Tonsillectomy  as a child    and adenoids  . Dilation and curettage of uterus      at age 75 . Cyst removed  >89yrago    from left leg  . Carpal tunnel release  80's    right   . Total abdominal hysterectomy  2003  . Total hip arthroplasty  2011    left  .  Colonoscopy    . Video bronchoscopy  10/20/2011    Procedure: VIDEO BRONCHOSCOPY;  Surgeon: EdGrace IsaacMD;  [facility];  Service: Thoracic;  Laterality: N/A;  . Wedge resection  10-20-2011    rt lung  - for lung cancer  . Total hip arthroplasty  03/07/2012    Procedure: TOTAL HIP ARTHROPLASTY ANTERIOR APPROACH;  Surgeon: MaMauri PoleMD;  [facility];  Service: Orthopedics;  Laterality: Right;  . Total knee arthroplasty Right 01/02/2013    Procedure: RIGHT TOTAL KNEE ARTHROPLASTY;  Surgeon: MaMauri PoleMD;  [facility];  Service: Orthopedics;  Laterality: Right;  . Total knee arthroplasty Left 04/03/2013    Procedure: LEFT TOTAL KNEE ARTHROPLASTY;  Surgeon: MaMauri PoleMD;  [facility];  Service: Orthopedics;  Laterality: Left;  . Excisional total knee arthroplasty Right 06/03/2014    Procedure: RIGHT KNEE SCAR EXCISION;  Surgeon: MaMauri PoleMD;  [facility];  Service: Orthopedics;  Laterality: Right;  Family History  Problem Relation Age of Onset  . Alcohol abuse    . Depression    . Hearing loss    . Heart disease    . Hypertension    . Anesthesia problems Neg Hx   . Hypotension Neg Hx   . Malignant hyperthermia Neg Hx   . Pseudochol deficiency Neg Hx   . Hypertension Brother     Social History   Social History  . Marital Status: Married    Spouse Name: N/A  . Number of Children: N/A  . Years of Education: N/A   Occupational History  . retired    Social History Main Topics  . Smoking status: Former Smoker -- 0.50 packs/day for 50 years    Types: Cigarettes    Quit date: 09/23/2011  . Smokeless tobacco: Never Used  . Alcohol Use: 0.0 oz/week     Comment: 2 beers every night  . Drug Use: No  . Sexual Activity: No   Other Topics Concern  . None   Social History Narrative     Current outpatient prescriptions:  .  Acetaminophen (TYLENOL PO), Take by mouth as needed., Disp: , Rfl:  .  albuterol (PROAIR HFA) 108 (90  Base) MCG/ACT inhaler, Inhale 2 puffs into the lungs every 6 (six) hours as needed for wheezing or shortness of breath., Disp: 1 Inhaler, Rfl: 6 .  amLODipine (NORVASC) 5 MG tablet, Take 1 tablet (5 mg total) by mouth at bedtime., Disp: 90 tablet, Rfl: 3 .  BREO ELLIPTA 100-25 MCG/INH AEPB, INHALE ONE PUFF BY MOUTH ONCE DAILY, Disp: 60 each, Rfl: 12 .  Calcium-Magnesium-Zinc 500-250-12.5 MG TABS, Take 1 tablet by mouth daily., Disp: , Rfl:  .  Cholecalciferol (VITAMIN D-3 PO), Take 1,200 mg by mouth every morning., Disp: , Rfl:  .  FLUoxetine (PROZAC) 20 MG capsule, Take 1 capsule (20 mg total) by mouth 2 (two) times daily., Disp: 180 capsule, Rfl: 3 .  fluticasone (FLONASE) 50 MCG/ACT nasal spray, Place 2 sprays into both nostrils daily., Disp: 16 g, Rfl: 5 .  GuaiFENesin (MUCINEX PO), Take by mouth as needed., Disp: , Rfl:  .  Probiotic Product (PROBIOTIC ADVANCED PO), Take by mouth daily., Disp: , Rfl:  .  sodium chloride (OCEAN) 0.65 % SOLN nasal spray, Place 1 spray into both nostrils daily as needed for congestion., Disp: , Rfl:  .  SPIRIVA RESPIMAT 2.5 MCG/ACT AERS, INHALE TWO SPRAY(S) BY MOUTH ONCE DAILY, Disp: 4 g, Rfl: 5 .  vitamin C (ASCORBIC ACID) 500 MG tablet, Take 500 mg by mouth daily., Disp: , Rfl:  .  [DISCONTINUED] Calcium Carbonate-Vitamin D (CALCIUM + D PO), Take by mouth daily. 500+400 , Disp: , Rfl:   EXAM:  Filed Vitals:   11/10/15 0946  BP: 110/82  Pulse: 77  Temp: 98.4 F (36.9 C)    Body mass index is 25.16 kg/(m^2).  GENERAL: vitals reviewed and listed above, alert, oriented, appears well hydrated and in no acute distress  HEENT: atraumatic, conjunttiva clear, no obvious abnormalities on inspection of external nose and ears  NECK: no obvious masses on inspection  LUNGS: clear to auscultation bilaterally, no wheezes, rales or rhonchi, good air movement  CV: HRRR, no peripheral edema  MS: moves all extremities without noticeable  abnormality  PSYCH/NEURO: pleasant and cooperative, no obvious depression or anxiety, affect is normal, gait is normal, speech and processing grossly intact, action tremor bilateral hands, no tremor at rest, normal strength, sensitivity to light touch  in range of motion in the upper extremities without rigidity  ASSESSMENT AND PLAN:  Discussed the following assessment and plan:  Tremor  Other fatigue - Plan: TSH  Cold intolerance - Plan: TSH  -we discussed possible serious and likely etiologies, workup and treatment, treatment risks and return precautions - likely essential tremor given exam findings, Parkinson disorder extremely unlikely, no signs of this on exam -after this discussion, Brecklynn opted for monitoring/observation as is not interfering with tasks, check thyroid level -follow up advised as scheduled -of course, we advised [place]  to return or notify a doctor immediately if symptoms worsen or persist or new concerns arise.  -Patient advised to return or notify a doctor immediately if symptoms worsen or persist or new concerns arise.  Patient Instructions  Before you leave: -Labs to check thyroid level -Regular follow-up as scheduled  We recommend the following healthy lifestyle measures: - eat a healthy whole foods diet consisting of regular small meals composed of vegetables, fruits, beans, nuts, seeds, healthy meats such as white chicken and fish and whole grains.  - avoid sweets, white starchy foods, fried foods, fast food, processed foods, sodas, red meet and other fattening foods.  - get a least 150-300 minutes of aerobic exercise per week.   -We have ordered labs or studies at this visit. It can take up to 1-2 weeks for results and processing. We will contact you with instructions IF your results are abnormal. Normal results will be released to your [facility]. If you have not heard from [place] or can not find your results in [facility] in 2 weeks please contact our  office.            Colin Benton R.

## 2015-11-26 ENCOUNTER — Telehealth: Payer: Self-pay | Admitting: Internal Medicine

## 2015-11-26 NOTE — Telephone Encounter (Signed)
Per [name], pt's rov can be moved back to June per MR- pt rescheduled.  Nothing further needed.

## 2015-11-27 ENCOUNTER — Ambulatory Visit: Payer: Medicare Other | Admitting: Internal Medicine

## 2015-12-03 DIAGNOSIS — H349 Unspecified retinal vascular occlusion: Secondary | ICD-10-CM | POA: Diagnosis not present

## 2015-12-03 DIAGNOSIS — Z961 Presence of intraocular lens: Secondary | ICD-10-CM | POA: Diagnosis not present

## 2015-12-03 DIAGNOSIS — H34832 Tributary (branch) retinal vein occlusion, left eye, with macular edema: Secondary | ICD-10-CM | POA: Diagnosis not present

## 2016-01-10 DIAGNOSIS — S62609A Fracture of unspecified phalanx of unspecified finger, initial encounter for closed fracture: Secondary | ICD-10-CM | POA: Diagnosis not present

## 2016-01-12 ENCOUNTER — Ambulatory Visit (INDEPENDENT_AMBULATORY_CARE_PROVIDER_SITE_OTHER): Payer: Medicare Other | Admitting: Internal Medicine

## 2016-01-12 ENCOUNTER — Encounter: Payer: Self-pay | Admitting: Internal Medicine

## 2016-01-12 VITALS — BP 118/72 | HR 83 | Ht 62.5 in | Wt 134.0 lb

## 2016-01-12 DIAGNOSIS — J449 Chronic obstructive pulmonary disease, unspecified: Secondary | ICD-10-CM

## 2016-01-12 DIAGNOSIS — S62615A Displaced fracture of proximal phalanx of left ring finger, initial encounter for closed fracture: Secondary | ICD-10-CM | POA: Diagnosis not present

## 2016-01-12 MED ORDER — UMECLIDINIUM-VILANTEROL 62.5-25 MCG/INH IN AEPB: 1.0000 | INHALATION_SPRAY | Freq: Every day | RESPIRATORY_TRACT | Status: DC

## 2016-01-12 MED ORDER — FLUTICASONE FUROATE 100 MCG/ACT IN AEPB: 1.0000 | INHALATION_SPRAY | Freq: Every day | RESPIRATORY_TRACT | Status: DC

## 2016-01-12 NOTE — Progress Notes (Signed)
Subjective:     Patient ID: [name], female   DOB: 12-26-1940, 75 y.o.   MRN: [id number]  HPI  75 yo female former smoker with COPD , Lung cancer   TEST   #COPD-MM genotype - 2013: Pre RUL lobectomy and Pre Rx: fev1 1.1L/60% , DLCO 9.6/56%, TLC 5.6/128%  - 2013 May: Post lobectomy and post Rx: fev1: 1.28L/60%, ratio 49 . No desat walk test - Rx spiriva, symbicort since spring 2013 - 2014 Nov: 06/22/2013. FEV1 is 1.3 L/62%. No post bronchodilator change. FVC is 2.6 L postop day later which is 95%. Ratio is 50. Total lung capacity is 4.96L/104%. DLCO is level 0.7/54%. - On synmbicort alone 04/05/2014 - Walk test today 185 feet x 3 laps on RA: Desat to < 88% at 2 laps - Change to [facility] plus Spiriva Respimat April 2016  # Stage 1 lung cancer: pT1b, pN0 Stage 08 October 2011 - s/p lobectomy by Dr Servando Snare - surveillance by Dr Earlie Server - 05/15/13 - Clear CT. No recurrence - October 2015 - clear CT,No recurrence  -Oct 2016 >no recurrence   09/11/2015 Follow up : COPD  Pt returns for 4 weeks follow up for COPD  PFT done today were reviewed with pt and showed  FEV 1 56%, ratio 49 , FVC 87%,  DLCO 43%  This is down slightly from 2014 (FEV1 62%)  CT chest in Oct 2016 did show mod emphysema .  She remains on Spiriva and BREO .  She has tried Symbicort and Dulera in past.  She says she does get winded with activity -but tried to stay active.  Has minimal cough . Sometimes has some wheezing and sinus drainge.  Denies chest pain, orthopnea, edema or hemoptysis .   Most recent CT chest Oct 2016 survelliance showed no cancer recurrence . Denies any chest pain, orthopnea, PND, leg swelling or hemoptysis.  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 She has questions regarding her prognosis .  Worried she will not be able to help him. "I need to out live him and be able to care for him"  She remains active with Sw[employer] at [employer] and is part of the  :"[employer]. "    OV 01/12/2016  Chief Complaint  Patient presents with  . Follow-up    Pt c/o some dry cough and SOB with exertion. Pt denies wheeze/CP/tightness.    Moderate COPD lung cancer patient  Pulmonary function Feb 2017 showed Gold stage II COPD. CT scan of the chest October 2016 did not show any lung cancer recurrence. She is on triple inhaler therapy. She wants to switch to another version of triple inhaler therapy namely ANORO with and ICS. This is because of friend has had good experience with it. She continues her shortness of breath with exertion but is reluctant to try oxygen. We discussed nocturnal oxygen. She says she will consider the indigent system on her own. COPD cat score is 19. In the interim she has developed mild intention tremor with inhalers are not bothering her as much.    CAT COPD Symptom & Quality of Life Score ([employer] trademark) 0 is no burden. 5 is highest burden 01/12/2016   Never Cough -> Cough all the time 2  No phlegm in chest -> Chest is full of phlegm 2  No chest tightness -> Chest feels very tight 1  No dyspnea for 1 flight stairs/hill -> Very dyspneic for 1 flight of stairs 4  No limitations for ADL  at home -> Very limited with ADL at home 2  Confident leaving home -> Not at all confident leaving home 1  Sleep soundly -> Do not sleep soundly because of lung condition 3  Lots of Energy -> No energy at all 4  TOTAL Score (max 40)  19        has a past medical history of COPD (chronic obstructive pulmonary disease) ([place]); Depression; Gout; Allergic rhinitis; Macrocytosis; Hypertension; Hyperlipidemia; migraines (90's); Arthritis; Osteopenia; Liver cyst; IBS (irritable bowel syndrome); Bilateral cataracts; Hot flashes; Chicken pox; Seasonal allergies; History of blood transfusion; Pneumonia; Lung cancer ([place]); Anemia; S/P right THA, AA - Dr. Alvan Dame, ortho (03/07/2012); S/P right TKA (01/02/2013); Smoking (09/12/2011); PONV (postoperative nausea and  vomiting); Migraine; and Cholelithiasis (05/21/2014).   reports that she quit smoking about 4 years ago. Her smoking use included Cigarettes. She has a 25 pack-year smoking history. She has never used smokeless tobacco.  Past Surgical History  Procedure Laterality Date  . Inguinal hernia repair  2000  . Neuroplasty decompression median nerve at carpal tunnel    . Rotator cuff repair  2001    right  . Tonsillectomy  as a child    and adenoids  . Dilation and curettage of uterus      at age 13  . Cyst removed  >78yr ago    from left leg  . Carpal tunnel release  80's    right   . Total abdominal hysterectomy  2003  . Total hip arthroplasty  2011    left  . Colonoscopy    . Video bronchoscopy  10/20/2011    Procedure: VIDEO BRONCHOSCOPY;  Surgeon: EGrace Isaac MD;  [facility];  Service: Thoracic;  Laterality: N/A;  . Wedge resection  10-20-2011    rt lung  - for lung cancer  . Total hip arthroplasty  03/07/2012    Procedure: TOTAL HIP ARTHROPLASTY ANTERIOR APPROACH;  Surgeon: MMauri Pole MD;  [facility];  Service: Orthopedics;  Laterality: Right;  . Total knee arthroplasty Right 01/02/2013    Procedure: RIGHT TOTAL KNEE ARTHROPLASTY;  Surgeon: MMauri Pole MD;  [facility];  Service: Orthopedics;  Laterality: Right;  . Total knee arthroplasty Left 04/03/2013    Procedure: LEFT TOTAL KNEE ARTHROPLASTY;  Surgeon: MMauri Pole MD;  [facility];  Service: Orthopedics;  Laterality: Left;  . Excisional total knee arthroplasty Right 06/03/2014    Procedure: RIGHT KNEE SCAR EXCISION;  Surgeon: MMauri Pole MD;  [facility];  Service: Orthopedics;  Laterality: Right;    Allergies  Allergen Reactions  . Clindamycin/Lincomycin Nausea And Vomiting  . Penicillins Rash       . Spiriva [Tiotropium Bromide Monohydrate]     General intolerance  . Sulfa Antibiotics Hives and Rash    Severe Hives with skin reaction    Immunization History   Administered Date(s) Administered  . Influenza Split 06/02/2011, 05/09/2012  . Influenza,inj,Quad PF,36+ Mos 05/18/2013, 04/17/2014, 05/21/2015  . Pneumococcal Conjugate-13 04/05/2014  . Pneumococcal Polysaccharide-23 08/09/1998, 09/21/2011  . Tdap 06/22/2012  . Zoster 08/10/2007    Family History  Problem Relation Age of Onset  . Alcohol abuse    . Depression    . Hearing loss    . Heart disease    . Hypertension    . Anesthesia problems Neg Hx   . Hypotension Neg Hx   . Malignant hyperthermia Neg Hx   . Pseudochol deficiency Neg Hx   .  Hypertension Brother      Current outpatient prescriptions:  .  Acetaminophen (TYLENOL PO), Take by mouth as needed., Disp: , Rfl:  .  albuterol (PROAIR HFA) 108 (90 Base) MCG/ACT inhaler, Inhale 2 puffs into the lungs every 6 (six) hours as needed for wheezing or shortness of breath., Disp: 1 Inhaler, Rfl: 6 .  amLODipine (NORVASC) 5 MG tablet, Take 1 tablet (5 mg total) by mouth at bedtime., Disp: 90 tablet, Rfl: 3 .  BREO ELLIPTA 100-25 MCG/INH AEPB, INHALE ONE PUFF BY MOUTH ONCE DAILY, Disp: 60 each, Rfl: 12 .  Calcium-Magnesium-Zinc 500-250-12.5 MG TABS, Take 1 tablet by mouth daily., Disp: , Rfl:  .  Cholecalciferol (VITAMIN D-3 PO), Take 1,200 mg by mouth every morning., Disp: , Rfl:  .  FLUoxetine (PROZAC) 20 MG capsule, Take 1 capsule (20 mg total) by mouth 2 (two) times daily., Disp: 180 capsule, Rfl: 3 .  fluticasone (FLONASE) 50 MCG/ACT nasal spray, Place 2 sprays into both nostrils daily., Disp: 16 g, Rfl: 5 .  GuaiFENesin (MUCINEX PO), Take by mouth as needed., Disp: , Rfl:  .  Probiotic Product (PROBIOTIC ADVANCED PO), Take by mouth daily., Disp: , Rfl:  .  sodium chloride (OCEAN) 0.65 % SOLN nasal spray, Place 1 spray into both nostrils daily as needed for congestion., Disp: , Rfl:  .  SPIRIVA RESPIMAT 2.5 MCG/ACT AERS, INHALE TWO SPRAY(S) BY MOUTH ONCE DAILY, Disp: 4 g, Rfl: 5 .  vitamin C (ASCORBIC ACID) 500 MG tablet, Take  500 mg by mouth daily. Reported on 01/12/2016, Disp: , Rfl:  .  [DISCONTINUED] Calcium Carbonate-Vitamin D (CALCIUM + D PO), Take by mouth daily. 500+400 , Disp: , Rfl:      Review of Systems     Objective:   Physical Exam  Filed Vitals:   01/12/16 1012  BP: 118/72  Pulse: 83  Height: 5' 2.5" (1.588 m)  Weight: 134 lb (60.782 kg)  SpO2: 93%        Assessment:       ICD-9-CM ICD-10-CM   1. Moderate COPD (chronic obstructive pulmonary disease), Sees Dr. Chase Caller, Pulm 496 J44.9        Plan:       Stable disease  Plan  -per your wish: stop spiriva and breo but start ANORO and ARNUITY daily  - take sample if we have  - subject to inusrance approval - use albtuerol as needed - consider innogen for night time and exretion o2 to help desats and symptoms  - per our discussion  Followup - CT chest oct 2017 per prior order for lung cancr screen - ROV with me after Oct 2017 after CT     Dr. Brand Males, M.D., [facility].C.P Pulmonary and Critical Care Medicine Staff Physician [place] [facility] Pager: [phone], If no answer or between  15:00h - 7:00h: call [phone]  01/12/2016 10:37 AM

## 2016-01-12 NOTE — Patient Instructions (Addendum)
ICD-9-CM ICD-10-CM   1. Moderate COPD (chronic obstructive pulmonary disease), Sees Dr. Chase Caller, Pulm 496 J44.9     Stable disease  Plan  -per your wish: stop spiriva and breo but start ANORO and ARNUITY daily  - take sample if we have  - subject to inusrance approval - use albtuerol as needed - consider innogen for night time and exretion o2 to help desats and symptoms  - per our discussion  Followup - CT chest oct 2017 per prior order for lung cancr screen - ROV with me after Oct 2017 after CT

## 2016-01-16 ENCOUNTER — Encounter: Payer: Self-pay | Admitting: Family Medicine

## 2016-01-16 ENCOUNTER — Telehealth: Payer: Self-pay | Admitting: Family Medicine

## 2016-01-16 ENCOUNTER — Ambulatory Visit (INDEPENDENT_AMBULATORY_CARE_PROVIDER_SITE_OTHER): Payer: Medicare Other | Admitting: Family Medicine

## 2016-01-16 VITALS — BP 102/80 | HR 86 | Temp 98.3°F | Ht 62.5 in | Wt 134.1 lb

## 2016-01-16 DIAGNOSIS — B029 Zoster without complications: Secondary | ICD-10-CM | POA: Diagnosis not present

## 2016-01-16 MED ORDER — VALACYCLOVIR HCL 1 G PO TABS: 1000.0000 mg | ORAL_TABLET | Freq: Three times a day (TID) | ORAL | Status: DC

## 2016-01-16 NOTE — Progress Notes (Signed)
Pre visit review using our clinic review tool, if applicable. No additional management support is needed unless otherwise documented below in the visit note. 

## 2016-01-16 NOTE — Progress Notes (Signed)
   Subjective:    Patient ID: [name], female    DOB: 06-30-1941, 75 y.o.   MRN: [id number]  HPI Here for a rash on the left face and trunk that appeared 5 days ago. At first there were a lot of small blisters but these have now dried up. The rash itches and burns a little, but there is not a lot of pain with it. Of note the day before this appeared she fractured her left hand. She is now wearing a cast on that hand.    Review of Systems  Constitutional: Negative.   Respiratory: Negative.   Cardiovascular: Negative.   Skin: Positive for rash.       Objective:   Physical Exam  Constitutional: She appears well-developed and well-nourished.  Skin:  There are widespread red patches of macular or slightly raised erythema on the occipital scalp, left face, left neck, left chest, left shoulder and left upper back. There are some scattered scabbed lesions as well           Assessment & Plan:  This is shingles. Treat with Valtrex for 10 days. It does not seem to be very painful for her, but she does have some oxycodone from the Orthopedist to use if needed. Laurey Morale, MD

## 2016-01-16 NOTE — Telephone Encounter (Signed)
Pt coming to see Dr. Sarajane Jews today

## 2016-01-16 NOTE — Telephone Encounter (Signed)
[facility] - Client [place] [facility]  Patient Name: [name]  DOB: [birth year]    Initial Comment caller states she has a rash on her body   Nurse Assessment  Nurse: Wynetta Emery, RN, Baker Janus Date/Time Eilene Ghazi Time): 01/16/2016 9:40:49 AM  Confirm and document reason for call. If symptomatic, describe symptoms. You must click the next button to save text entered. ---Lavell Luster broke left hand while sitting in chair holding dog's leash when something caught dog's attention in the opposite direction-- she broke her hand spiral fracture - and manually rotated after numbing it - cast in place to keep it from moving -- does not want surgery so this is what ortho doc did. She now has rash to body neck up in hair and down chest -- given hydro 5/325 for pain  Has the patient traveled out of the country within the last 30 days? ---No  Does the patient have any new or worsening symptoms? ---Yes  Will a triage be completed? ---Yes  Related visit to physician within the last 2 weeks? ---Yes  Does the PT have any chronic conditions? (i.e. diabetes, asthma, etc.) ---Yes  List chronic conditions. ---Fx of left hand  Is this a behavioral health or substance abuse call? ---No     Guidelines    Guideline Title Affirmed Question Affirmed Notes  Rash - Widespread On Drugs Taking new prescription medicine (EXCEPTIONs: finished taking new prescription antibiotic OR questions about flushing from niacin)    Final Disposition User   See Physician within [address] Hours [place], RN, Baker Janus    Comments  NOTE 01-16-2016 230pm appt w/ arrival time 215pm with Dr. Annie Main a. Sarajane Jews [place] location   Referrals  REFERRED TO PCP OFFICE   Disagree/Comply: Comply

## 2016-01-19 ENCOUNTER — Encounter: Payer: Self-pay | Admitting: Family Medicine

## 2016-01-19 ENCOUNTER — Ambulatory Visit (INDEPENDENT_AMBULATORY_CARE_PROVIDER_SITE_OTHER): Payer: Medicare Other | Admitting: Family Medicine

## 2016-01-19 VITALS — BP 110/80 | HR 74 | Temp 98.2°F | Ht 62.5 in | Wt 134.7 lb

## 2016-01-19 DIAGNOSIS — F3342 Major depressive disorder, recurrent, in full remission: Secondary | ICD-10-CM | POA: Diagnosis not present

## 2016-01-19 DIAGNOSIS — B029 Zoster without complications: Secondary | ICD-10-CM

## 2016-01-19 DIAGNOSIS — J441 Chronic obstructive pulmonary disease with (acute) exacerbation: Secondary | ICD-10-CM

## 2016-01-19 DIAGNOSIS — I159 Secondary hypertension, unspecified: Secondary | ICD-10-CM

## 2016-01-19 LAB — CBC
HEMATOCRIT: 42.9 % (ref 36.0–46.0)
Hemoglobin: 14.3 g/dL (ref 12.0–15.0)
MCHC: 33.2 g/dL (ref 30.0–36.0)
MCV: 93.8 fl (ref 78.0–100.0)
PLATELETS: 215 10*3/uL (ref 150.0–400.0)
RBC: 4.58 Mil/uL (ref 3.87–5.11)
RDW: 12.7 % (ref 11.5–15.5)
WBC: 4.4 10*3/uL (ref 4.0–10.5)

## 2016-01-19 LAB — BASIC METABOLIC PANEL
BUN: 8 mg/dL (ref 6–23)
CALCIUM: 9.2 mg/dL (ref 8.4–10.5)
CO2: 28 meq/L (ref 19–32)
Chloride: 99 mEq/L (ref 96–112)
Creatinine, Ser: 0.89 mg/dL (ref 0.40–1.20)
GFR: 66.15 mL/min (ref >60.00)
GLUCOSE: 82 mg/dL (ref 70–99)
Potassium: 4.3 mEq/L (ref 3.5–5.1)
SODIUM: 134 meq/L — AB (ref 135–145)

## 2016-01-19 NOTE — Progress Notes (Signed)
HPI:  HTN: -meds: amlodipine 5 -stable -denies: CP, SOB, DOE  Depression and anxiety and hot flashes: -meds: prozac -reports: doing well, wants to continue -denies: SI, depression  COPD -managed by Dr. Chase Caller, pulmonologist -doing new regimen - anoro, arnuity  Hx stage 50A non-sm cell lung ca -observation w/out disease recurrence since 2013  Hx osteoporosis or osteopenia: -takes vit D, gets regular exercise -had bone density test  Osteoarthritis: -sees ortho  Shingles: -on L chest, seen here last week, on valtrex -doing much better  Fx of finger: -from injury with dog, seeing Dr. Amedeo Plenty in ortho -L hand in cast, doing better  ROS: See pertinent positives and negatives per HPI.  Past Medical History  Diagnosis Date  . COPD (chronic obstructive pulmonary disease) ([place])   . Depression     hx ocd and anxiety in record as well  . Gout     last time 6-73yr ago   . Allergic rhinitis   . Macrocytosis   . Hypertension     takes Amlodipine nightly  . Hyperlipidemia     taking Flax Seed Oil and Fish Oil  . Hx of migraines 90's    after menopause HA stopped  . Arthritis     all over  . Osteopenia     takes Calcium and Vit D bid  . Liver cyst   . IBS (irritable bowel syndrome)   . Bilateral cataracts   . Hot flashes     takes Prozac daily  . Chicken pox   . Seasonal allergies   . History of blood transfusion   . Pneumonia     x 2 ;last time back in 1999  . [place]   . Anemia   . S/P right THA, AA - Dr. OAlvan Dame ortho 03/07/2012  . 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 Smoking 09/12/2011  . PONV (postoperative nausea and vomiting)   . Migraine     MIGRAINES RESOLVED WITH MENOPAUSE  . Cholelithiasis 05/21/2014    On CT scan 2015     Past Surgical History  Procedure Laterality Date  . Inguinal hernia repair  2000  . Neuroplasty decompression median nerve at carpal tunnel    . Rotator cuff repair  2001    right  . Tonsillectomy  as a child    and  adenoids  . Dilation and curettage of uterus      at age 75 . Cyst removed  >343yrago    from left leg  . Carpal tunnel release  80's    right   . Total abdominal hysterectomy  2003  . Total hip arthroplasty  2011    left  . Colonoscopy    . Video bronchoscopy  10/20/2011    Procedure: VIDEO BRONCHOSCOPY;  Surgeon: EdGrace IsaacMD;  [facility];  Service: Thoracic;  Laterality: N/A;  . Wedge resection  10-20-2011    rt lung  - for lung cancer  . Total hip arthroplasty  03/07/2012    Procedure: TOTAL HIP ARTHROPLASTY ANTERIOR APPROACH;  Surgeon: MaMauri PoleMD;  [facility];  Service: Orthopedics;  Laterality: Right;  . Total knee arthroplasty Right 01/02/2013    Procedure: RIGHT TOTAL KNEE ARTHROPLASTY;  Surgeon: MaMauri PoleMD;  [facility];  Service: Orthopedics;  Laterality: Right;  . Total knee arthroplasty Left 04/03/2013    Procedure: LEFT TOTAL KNEE ARTHROPLASTY;  Surgeon: MaMauri PoleMD;  [facility];  Service: Orthopedics;  Laterality: Left;  . Excisional total knee arthroplasty Right 06/03/2014    Procedure: RIGHT KNEE SCAR EXCISION;  Surgeon: [name], MD;  [facility]: [facility];  Service: Orthopedics;  Laterality: Right;    Family History  Problem Relation Age of Onset  . Alcohol abuse    . Depression    . Hearing loss    . Heart disease    . Hypertension    . Anesthesia problems Neg Hx   . Hypotension Neg Hx   . Malignant hyperthermia Neg Hx   . Pseudochol deficiency Neg Hx   . Hypertension Brother     Social History   Social History  . Marital Status: Married    Spouse Name: N/A  . Number of Children: N/A  . Years of Education: N/A   Occupational History  . retired    Social History Main Topics  . Smoking status: Former Smoker -- 0.50 packs/day for 50 years    Types: Cigarettes    Quit date: 09/23/2011  . Smokeless tobacco: Never Used  . Alcohol Use: 0.0 oz/week     Comment: 2 beers every night  . Drug Use: No   . Sexual Activity: No   Other Topics Concern  . None   Social History Narrative     Current outpatient prescriptions:  .  Acetaminophen (TYLENOL PO), Take by mouth as needed., Disp: , Rfl:  .  albuterol (PROAIR HFA) 108 (90 Base) MCG/ACT inhaler, Inhale 2 puffs into the lungs every 6 (six) hours as needed for wheezing or shortness of breath., Disp: 1 Inhaler, Rfl: 6 .  amLODipine (NORVASC) 5 MG tablet, Take 1 tablet (5 mg total) by mouth at bedtime., Disp: 90 tablet, Rfl: 3 .  Calcium-Magnesium-Zinc 500-250-12.5 MG TABS, Take 1 tablet by mouth daily., Disp: , Rfl:  .  Cholecalciferol (VITAMIN D-3 PO), Take 1,200 mg by mouth every morning., Disp: , Rfl:  .  Cyanocobalamin (VITAMIN B-12 PO), Take by mouth., Disp: , Rfl:  .  FLUoxetine (PROZAC) 20 MG capsule, Take 1 capsule (20 mg total) by mouth 2 (two) times daily., Disp: 180 capsule, Rfl: 3 .  fluticasone (FLONASE) 50 MCG/ACT nasal spray, Place 2 sprays into both nostrils daily., Disp: 16 g, Rfl: 5 .  Fluticasone Furoate (ARNUITY ELLIPTA) 100 MCG/ACT AEPB, Inhale 1 puff into the lungs daily., Disp: 30 each, Rfl: 5 .  GuaiFENesin (MUCINEX PO), Take by mouth as needed., Disp: , Rfl:  .  Probiotic Product (PROBIOTIC ADVANCED PO), Take by mouth daily., Disp: , Rfl:  .  sodium chloride (OCEAN) 0.65 % SOLN nasal spray, Place 1 spray into both nostrils daily as needed for congestion., Disp: , Rfl:  .  umeclidinium-vilanterol (ANORO ELLIPTA) 62.5-25 MCG/INH AEPB, Inhale 1 puff into the lungs daily., Disp: 1 each, Rfl: 5 .  valACYclovir (VALTREX) 1000 MG tablet, Take 1 tablet (1,000 mg total) by mouth 3 (three) times daily., Disp: 30 tablet, Rfl: 0 .  [DISCONTINUED] Calcium Carbonate-Vitamin D (CALCIUM + D PO), Take by mouth daily. 500+400 , Disp: , Rfl:   EXAM:  Filed Vitals:   01/19/16 0926  BP: 110/80  Pulse: 74  Temp: 98.2 F (36.8 C)    Body mass index is 24.23 kg/(m^2).  GENERAL: vitals reviewed and listed above, alert, oriented,  appears well hydrated and in no acute distress  HEENT: atraumatic, conjunttiva clear, no obvious abnormalities on inspection of external nose and ears  NECK: no obvious masses on inspection  LUNGS: clear to auscultation  bilaterally, no wheezes, rales or rhonchi, good air movement  CV: HRRR, no peripheral edema  MS: moves all extremities without noticeable abnormality  PSYCH: pleasant and cooperative, no obvious depression or anxiety  ASSESSMENT AND PLAN:  Discussed the following assessment and plan:  Secondary hypertension, unspecified - Plan: Basic metabolic panel, CBC (no diff)  Recurrent major depressive disorder, in full remission (HCC)  COPD exacerbation (HCC)  Shingles  -bmp, cbc -lifestyle recs -follow up 4 months -Patient advised to return or notify a doctor immediately if symptoms worsen or persist or new concerns arise.  Patient Instructions  BEFORE YOU LEAVE: -labs -follow up in 4 months  We recommend the following healthy lifestyle measures: - eat a healthy whole foods diet consisting of regular small meals composed of vegetables, fruits, beans, nuts, seeds, healthy meats such as white chicken and fish and whole grains.  - avoid sweets, white starchy foods, fried foods, fast food, processed foods, sodas, red meet and other fattening foods.  - get a least 150-300 minutes of aerobic exercise per week.   We have ordered labs or studies at this visit. It can take up to 1-2 weeks for results and processing. IF results require follow up or explanation, we will call you with instructions. Clinically stable results will be released to your [facility]. If you have not heard from [place] or cannot find your results in [facility] in 2 weeks please contact our office at [phone].  If you are not yet signed up for [facility], please consider signing up.            Colin Benton R.

## 2016-01-19 NOTE — Patient Instructions (Addendum)
BEFORE YOU LEAVE: -labs -follow up in 4 months  We recommend the following healthy lifestyle measures: - eat a healthy whole foods diet consisting of regular small meals composed of vegetables, fruits, beans, nuts, seeds, healthy meats such as white chicken and fish and whole grains.  - avoid sweets, white starchy foods, fried foods, fast food, processed foods, sodas, red meet and other fattening foods.  - get a least 150-300 minutes of aerobic exercise per week.   We have ordered labs or studies at this visit. It can take up to 1-2 weeks for results and processing. IF results require follow up or explanation, we will call you with instructions. Clinically stable results will be released to your [facility]. If you have not heard from [place] or cannot find your results in [facility] in 2 weeks please contact our office at [phone].  If you are not yet signed up for [facility], please consider signing up.

## 2016-01-19 NOTE — Progress Notes (Signed)
Pre visit review using our clinic review tool, if applicable. No additional management support is needed unless otherwise documented below in the visit note. 

## 2016-01-22 DIAGNOSIS — S62615D Displaced fracture of proximal phalanx of left ring finger, subsequent encounter for fracture with routine healing: Secondary | ICD-10-CM | POA: Diagnosis not present

## 2016-01-22 DIAGNOSIS — Z4789 Encounter for other orthopedic aftercare: Secondary | ICD-10-CM | POA: Diagnosis not present

## 2016-02-02 DIAGNOSIS — S62615D Displaced fracture of proximal phalanx of left ring finger, subsequent encounter for fracture with routine healing: Secondary | ICD-10-CM | POA: Diagnosis not present

## 2016-02-11 DIAGNOSIS — S62615A Displaced fracture of proximal phalanx of left ring finger, initial encounter for closed fracture: Secondary | ICD-10-CM | POA: Diagnosis not present

## 2016-02-16 DIAGNOSIS — S62615D Displaced fracture of proximal phalanx of left ring finger, subsequent encounter for fracture with routine healing: Secondary | ICD-10-CM | POA: Diagnosis not present

## 2016-02-18 DIAGNOSIS — S62615D Displaced fracture of proximal phalanx of left ring finger, subsequent encounter for fracture with routine healing: Secondary | ICD-10-CM | POA: Diagnosis not present

## 2016-02-27 DIAGNOSIS — S62615A Displaced fracture of proximal phalanx of left ring finger, initial encounter for closed fracture: Secondary | ICD-10-CM | POA: Diagnosis not present

## 2016-03-01 DIAGNOSIS — S62615D Displaced fracture of proximal phalanx of left ring finger, subsequent encounter for fracture with routine healing: Secondary | ICD-10-CM | POA: Diagnosis not present

## 2016-04-19 ENCOUNTER — Ambulatory Visit (INDEPENDENT_AMBULATORY_CARE_PROVIDER_SITE_OTHER): Payer: Medicare Other | Admitting: *Deleted

## 2016-04-19 DIAGNOSIS — Z23 Encounter for immunization: Secondary | ICD-10-CM | POA: Diagnosis not present

## 2016-05-14 ENCOUNTER — Ambulatory Visit (INDEPENDENT_AMBULATORY_CARE_PROVIDER_SITE_OTHER): Payer: Medicare Other | Admitting: Family Medicine

## 2016-05-14 ENCOUNTER — Encounter: Payer: Self-pay | Admitting: Family Medicine

## 2016-05-14 VITALS — BP 112/78 | HR 68 | Temp 98.4°F | Ht 62.5 in | Wt 129.0 lb

## 2016-05-14 DIAGNOSIS — I159 Secondary hypertension, unspecified: Secondary | ICD-10-CM | POA: Diagnosis not present

## 2016-05-14 DIAGNOSIS — E663 Overweight: Secondary | ICD-10-CM | POA: Diagnosis not present

## 2016-05-14 DIAGNOSIS — C3490 Malignant neoplasm of unspecified part of unspecified bronchus or lung: Secondary | ICD-10-CM | POA: Diagnosis not present

## 2016-05-14 DIAGNOSIS — J449 Chronic obstructive pulmonary disease, unspecified: Secondary | ICD-10-CM | POA: Diagnosis not present

## 2016-05-14 NOTE — Progress Notes (Signed)
HPI:  Follow up:  HTN: -meds: amlodipine 5 -stable -denies: CP, SOB, DOE -has been exercising more and is feeling well  Depression and anxiety and hot flashes: -meds: prozac -reports: stable, no concerns -denies: SI, depression  COPD -managed by Dr. Chase Caller, pulmonologist  Hx stage 1A non-sm cell lung ca -observation w/out disease recurrence since 2013 -reports has labs with specialist soon  Hx osteoporosis or osteopenia: -takes vit D, gets regular exercise  Osteoarthritis: -sees ortho  Shingles: -doing much better, now only postinflammatory hypergpig  Fx of finger: -from injury with dog, seeing Dr. Amedeo Plenty in ortho -L hand in cast, doing better   ROS: See pertinent positives and negatives per HPI.  Past Medical History:  Diagnosis Date  . Allergic rhinitis   . Anemia   . Arthritis    all over  . Bilateral cataracts   . Chicken pox   . Cholelithiasis 05/21/2014   On CT scan 2015   . COPD (chronic obstructive pulmonary disease) ([place])   . Depression    hx ocd and anxiety in record as well  . Gout    last time 6-71yr ago   . History of blood transfusion   . Hot flashes    takes Prozac daily  . Hx of migraines 90's   after menopause HA stopped  . Hyperlipidemia    taking Flax Seed Oil and Fish Oil  . Hypertension    takes Amlodipine nightly  . IBS (irritable bowel syndrome)   . Liver cyst   . [place]   . Macrocytosis   . Migraine    MIGRAINES RESOLVED WITH MENOPAUSE  . Osteopenia    takes Calcium and Vit D bid  . Pneumonia    x 2 ;last time back in 1999  . PONV (postoperative nausea and vomiting)   . S/P right THA, AA - Dr. OAlvan Dame ortho 03/07/2012  . 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 Seasonal allergies   . Smoking 09/12/2011    Past Surgical History:  Procedure Laterality Date  . CARPAL TUNNEL RELEASE  80's   right   . COLONOSCOPY    . cyst removed  >356yrago   from left leg  . DILATION AND CURETTAGE OF UTERUS     at age  89. EXCISIONAL TOTAL KNEE ARTHROPLASTY Right 06/03/2014   Procedure: RIGHT KNEE SCAR EXCISION;  Surgeon: MaMauri PoleMD;  [facility];  Service: Orthopedics;  Laterality: Right;  . INGUINAL HERNIA REPAIR  2000  . neuroplasty decompression median nerve at carpal tunnel    . ROTATOR CUFF REPAIR  2001   right  . TONSILLECTOMY  as a child   and adenoids  . TOTAL ABDOMINAL HYSTERECTOMY  2003  . TOTAL HIP ARTHROPLASTY  2011   left  . TOTAL HIP ARTHROPLASTY  03/07/2012   Procedure: TOTAL HIP ARTHROPLASTY ANTERIOR APPROACH;  Surgeon: MaMauri PoleMD;  [facility];  Service: Orthopedics;  Laterality: Right;  . TOTAL KNEE ARTHROPLASTY Right 01/02/2013   Procedure: RIGHT TOTAL KNEE ARTHROPLASTY;  Surgeon: MaMauri PoleMD;  [facility];  Service: Orthopedics;  Laterality: Right;  . TOTAL KNEE ARTHROPLASTY Left 04/03/2013   Procedure: LEFT TOTAL KNEE ARTHROPLASTY;  Surgeon: MaMauri PoleMD;  [facility];  Service: Orthopedics;  Laterality: Left;  . Mar[place] KitchenIDEO BRONCHOSCOPY  10/20/2011   Procedure: VIDEO BRONCHOSCOPY;  Surgeon: EdGrace IsaacMD;  [facility]: [facility];  Laterality: N/A;  . WEDGE  RESECTION  10-20-2011   rt lung  - for lung cancer    Family History  Problem Relation Age of Onset  . Alcohol abuse    . Depression    . Hearing loss    . Heart disease    . Hypertension    . Anesthesia problems Neg Hx   . Hypotension Neg Hx   . Malignant hyperthermia Neg Hx   . Pseudochol deficiency Neg Hx   . Hypertension Brother     Social History   Social History  . Marital status: Married    Spouse name: N/A  . Number of children: N/A  . Years of education: N/A   Occupational History  . retired    Social History Main Topics  . Smoking status: Former Smoker    Packs/day: 0.50    Years: 50.00    Types: Cigarettes    Quit date: 09/23/2011  . Smokeless tobacco: Never Used  . Alcohol use 0.0 oz/week     Comment: 2 beers every night  . Drug  use: No  . Sexual activity: No   Other Topics Concern  . None   Social History Narrative  . None     Current Outpatient Prescriptions:  .  Acetaminophen (TYLENOL PO), Take by mouth as needed. Alternates with Ibuprofen, Disp: , Rfl:  .  albuterol (PROAIR HFA) 108 (90 Base) MCG/ACT inhaler, Inhale 2 puffs into the lungs every 6 (six) hours as needed for wheezing or shortness of breath., Disp: 1 Inhaler, Rfl: 6 .  amLODipine (NORVASC) 5 MG tablet, Take 1 tablet (5 mg total) by mouth at bedtime., Disp: 90 tablet, Rfl: 3 .  Calcium-Magnesium-Zinc 500-250-12.5 MG TABS, Take 1 tablet by mouth daily., Disp: , Rfl:  .  Cholecalciferol (VITAMIN D-3 PO), Take 1,200 mg by mouth every morning., Disp: , Rfl:  .  Cyanocobalamin (VITAMIN B-12 PO), Take by mouth., Disp: , Rfl:  .  FLUoxetine (PROZAC) 20 MG capsule, Take 1 capsule (20 mg total) by mouth 2 (two) times daily., Disp: 180 capsule, Rfl: 3 .  fluticasone (FLONASE) 50 MCG/ACT nasal spray, Place 2 sprays into both nostrils daily., Disp: 16 g, Rfl: 5 .  Fluticasone Furoate (ARNUITY ELLIPTA) 100 MCG/ACT AEPB, Inhale 1 puff into the lungs daily., Disp: 30 each, Rfl: 5 .  GuaiFENesin (MUCINEX PO), Take by mouth as needed., Disp: , Rfl:  .  IBUPROFEN PO, Take by mouth. Alternates with Tylenol, Disp: , Rfl:  .  Probiotic Product (PROBIOTIC ADVANCED PO), Take by mouth daily., Disp: , Rfl:  .  sodium chloride (OCEAN) 0.65 % SOLN nasal spray, Place 1 spray into both nostrils daily as needed for congestion., Disp: , Rfl:  .  umeclidinium-vilanterol (ANORO ELLIPTA) 62.5-25 MCG/INH AEPB, Inhale 1 puff into the lungs daily., Disp: 1 each, Rfl: 5 .  vitamin E 400 UNIT capsule, Take 400 Units by mouth daily., Disp: , Rfl:   EXAM:  Vitals:   05/14/16 0939  BP: 112/78  Pulse: 68  Temp: 98.4 F (36.9 C)    Body mass index is 23.22 kg/m.  GENERAL: vitals reviewed and listed above, alert, oriented, appears well hydrated and in no acute distress  HEENT:  atraumatic, conjunttiva clear, no obvious abnormalities on inspection of external nose and ears  NECK: no obvious masses on inspection  LUNGS: clear to auscultation bilaterally, no wheezes, rales or rhonchi, good air movement  CV: HRRR, no peripheral edema  MS: moves all extremities without noticeable abnormality  PSYCH: pleasant  and cooperative, no obvious depression or anxiety  ASSESSMENT AND PLAN:  Discussed the following assessment and plan:  Secondary hypertension  Moderate COPD (chronic obstructive pulmonary disease), Sees Dr. Chase Caller, Pulm  Squamous cell carcinoma of lung, unspecified laterality ([place])  Overweight (BMI 25.0-29.9)  -congratulated on lifestyle changes and encouraged to continue -she had her flu shot -she plans to do labs with specialist -Patient advised to return or notify a doctor immediately if symptoms worsen or persist or new concerns arise.  Patient Instructions  BEFORE YOU LEAVE: -follow up: follow up Dr. Maudie Mercury 3-4 months and Medicare exam with susan in February   We recommend the following healthy lifestyle for LIFE: 1) Small portions.   Tip: eat off of a salad plate instead of a dinner plate.  Tip: if you need more or a snack choose fruits, veggies and/or a handful of nuts or seeds.  2) Eat a healthy clean diet.  * Tip: Avoid (less then 1 serving per week): processed foods, sweets, sweetened drinks, white starches (rice, flour, bread, potatoes, pasta, etc), red meat, fast foods, butter  *Tip: CHOOSE instead   * 5-9 servings per day of fresh or frozen fruits and vegetables (but not corn, potatoes, bananas, canned or dried fruit)   *nuts and seeds, beans   *olives and olive oil   *small portions of lean meats such as fish and white chicken    *small portions of whole grains  3)Get at least 150 minutes of sweaty aerobic exercise per week.  4)Reduce stress - consider counseling, meditation and relaxation to balance other aspects of your  life.      Colin Benton R., DO

## 2016-05-14 NOTE — Patient Instructions (Addendum)
BEFORE YOU LEAVE: -follow up: follow up Dr. Maudie Mercury 3-4 months and Medicare exam with susan in February   We recommend the following healthy lifestyle for LIFE: 1) Small portions.   Tip: eat off of a salad plate instead of a dinner plate.  Tip: if you need more or a snack choose fruits, veggies and/or a handful of nuts or seeds.  2) Eat a healthy clean diet.  * Tip: Avoid (less then 1 serving per week): processed foods, sweets, sweetened drinks, white starches (rice, flour, bread, potatoes, pasta, etc), red meat, fast foods, butter  *Tip: CHOOSE instead   * 5-9 servings per day of fresh or frozen fruits and vegetables (but not corn, potatoes, bananas, canned or dried fruit)   *nuts and seeds, beans   *olives and olive oil   *small portions of lean meats such as fish and white chicken    *small portions of whole grains  3)Get at least 150 minutes of sweaty aerobic exercise per week.  4)Reduce stress - consider counseling, meditation and relaxation to balance other aspects of your life.

## 2016-05-14 NOTE — Progress Notes (Signed)
Pre visit review using our clinic review tool, if applicable. No additional management support is needed unless otherwise documented below in the visit note. 

## 2016-05-20 ENCOUNTER — Inpatient Hospital Stay: Admission: RE | Admit: 2016-05-20 | Payer: Medicare Other | Source: Ambulatory Visit

## 2016-05-21 ENCOUNTER — Ambulatory Visit (HOSPITAL_COMMUNITY)
Admission: RE | Admit: 2016-05-21 | Discharge: 2016-05-21 | Disposition: A | Discharge status: Home / Self Care | Payer: Medicare Other | Source: Ambulatory Visit | Attending: Internal Medicine | Admitting: Internal Medicine

## 2016-05-21 ENCOUNTER — Other Ambulatory Visit (HOSPITAL_BASED_OUTPATIENT_CLINIC_OR_DEPARTMENT_OTHER): Payer: Medicare Other

## 2016-05-21 DIAGNOSIS — I700 Atherosclerosis of aorta: Secondary | ICD-10-CM | POA: Insufficient documentation

## 2016-05-21 DIAGNOSIS — C3491 Malignant neoplasm of unspecified part of right bronchus or lung: Secondary | ICD-10-CM

## 2016-05-21 DIAGNOSIS — I2510 Atherosclerotic heart disease of native coronary artery without angina pectoris: Secondary | ICD-10-CM | POA: Diagnosis not present

## 2016-05-21 DIAGNOSIS — Z9889 Other specified postprocedural states: Secondary | ICD-10-CM | POA: Diagnosis not present

## 2016-05-21 DIAGNOSIS — Z8572 Personal history of non-Hodgkin lymphomas: Secondary | ICD-10-CM

## 2016-05-21 DIAGNOSIS — J449 Chronic obstructive pulmonary disease, unspecified: Secondary | ICD-10-CM

## 2016-05-21 DIAGNOSIS — C3490 Malignant neoplasm of unspecified part of unspecified bronchus or lung: Secondary | ICD-10-CM | POA: Diagnosis not present

## 2016-05-21 DIAGNOSIS — M8580 Other specified disorders of bone density and structure, unspecified site: Secondary | ICD-10-CM | POA: Diagnosis not present

## 2016-05-21 LAB — CBC WITH DIFFERENTIAL/PLATELET
BASO%: 1.8 % (ref 0.0–2.0)
Basophils Absolute: 0.1 10*3/uL (ref 0.0–0.1)
EOS%: 2.2 % (ref 0.0–7.0)
Eosinophils Absolute: 0.1 10*3/uL (ref 0.0–0.5)
HEMATOCRIT: 44.1 % (ref 34.8–46.6)
HEMOGLOBIN: 14.9 g/dL (ref 11.6–15.9)
LYMPH#: 1 10*3/uL (ref 0.9–3.3)
LYMPH%: 18.9 % (ref 14.0–49.7)
MCH: 31.7 pg (ref 25.1–34.0)
MCHC: 33.7 g/dL (ref 31.5–36.0)
MCV: 94.1 fL (ref 79.5–101.0)
MONO#: 0.4 10*3/uL (ref 0.1–0.9)
MONO%: 8 % (ref 0.0–14.0)
NEUT#: 3.6 10*3/uL (ref 1.5–6.5)
NEUT%: 69.1 % (ref 38.4–76.8)
Platelets: 177 10*3/uL (ref 145–400)
RBC: 4.69 10*6/uL (ref 3.70–5.45)
RDW: 12.7 % (ref 11.2–14.5)
WBC: 5.2 10*3/uL (ref 3.9–10.3)

## 2016-05-21 LAB — COMPREHENSIVE METABOLIC PANEL
ALT: 10 U/L (ref 0–55)
AST: 15 U/L (ref 5–34)
Albumin: 3.9 g/dL (ref 3.5–5.0)
Alkaline Phosphatase: 74 U/L (ref 40–150)
Anion Gap: 8 mEq/L (ref 3–11)
BUN: 10.1 mg/dL (ref 7.0–26.0)
CALCIUM: 9.3 mg/dL (ref 8.4–10.4)
CHLORIDE: 104 meq/L (ref 98–109)
CO2: 25 mEq/L (ref 22–29)
CREATININE: 0.8 mg/dL (ref 0.6–1.1)
EGFR: 70 mL/min/{1.73_m2} — ABNORMAL LOW (ref >90)
Glucose: 87 mg/dl (ref 70–140)
Potassium: 4.2 mEq/L (ref 3.5–5.1)
Sodium: 137 mEq/L (ref 136–145)
Total Bilirubin: 0.89 mg/dL (ref 0.20–1.20)
Total Protein: 6.6 g/dL (ref 6.4–8.3)

## 2016-05-24 ENCOUNTER — Encounter: Payer: Self-pay | Admitting: Family Medicine

## 2016-05-24 ENCOUNTER — Other Ambulatory Visit: Payer: Medicare Other

## 2016-05-24 DIAGNOSIS — I700 Atherosclerosis of aorta: Secondary | ICD-10-CM | POA: Insufficient documentation

## 2016-05-25 ENCOUNTER — Telehealth: Payer: Self-pay | Admitting: Internal Medicine

## 2016-05-25 ENCOUNTER — Encounter: Payer: Self-pay | Admitting: Internal Medicine

## 2016-05-25 ENCOUNTER — Ambulatory Visit (HOSPITAL_BASED_OUTPATIENT_CLINIC_OR_DEPARTMENT_OTHER): Payer: Medicare Other | Admitting: Internal Medicine

## 2016-05-25 VITALS — BP 141/89 | HR 69 | Temp 97.8°F | Resp 18 | Ht 62.5 in | Wt 129.7 lb

## 2016-05-25 DIAGNOSIS — C3491 Malignant neoplasm of unspecified part of right bronchus or lung: Secondary | ICD-10-CM

## 2016-05-25 DIAGNOSIS — Z85118 Personal history of other malignant neoplasm of bronchus and lung: Secondary | ICD-10-CM

## 2016-05-25 NOTE — Telephone Encounter (Signed)
Avs report and appointment schedule given to patient, per 05/25/16 los.

## 2016-05-25 NOTE — Progress Notes (Signed)
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   PRIOR THERAPY: Status post wedge resection of the right upper lobe lung nodule under the care of Dr. Roxan Hockey on 10/20/2011.   CURRENT THERAPY: Observation.  INTERVAL HISTORY: [name] 75 y.o. female returns to the clinic today for annual followup visit. The patient is feeling fine today with no specific complaints. She denied having any significant chest pain, shortness of breath, cough or hemoptysis. She denied having any weight loss or night sweats. The patient had repeat CT scan of the chest performed recently and she is here for evaluation and discussion of her scan results.   MEDICAL HISTORY: Past Medical History:  Diagnosis Date  . Allergic rhinitis   . Anemia   . Arthritis    all over  . Bilateral cataracts   . Chicken pox   . Cholelithiasis 05/21/2014   On CT scan 2015   . COPD (chronic obstructive pulmonary disease) ([place])   . Depression    hx ocd and anxiety in record as well  . Gout    last time 6-1yr ago   . History of blood transfusion   . Hot flashes    takes Prozac daily  . Hx of migraines 90's   after menopause HA stopped  . Hyperlipidemia    taking Flax Seed Oil and Fish Oil  . Hypertension    takes Amlodipine nightly  . IBS (irritable bowel syndrome)   . Liver cyst   . Lung cancer ([place]   . Macrocytosis   . Migraine    MIGRAINES RESOLVED WITH MENOPAUSE  . Osteopenia    takes Calcium and Vit D bid  . Pneumonia    x 2 ;last time back in 1999  . PONV (postoperative nausea and vomiting)   . S/P right THA, AA - Dr. OAlvan Dame ortho 03/07/2012  . 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 Seasonal allergies   . Smoking 09/12/2011    ALLERGIES:  is allergic  to clindamycin/lincomycin; penicillins; spiriva [tiotropium bromide monohydrate]; and sulfa antibiotics.  MEDICATIONS:  Current Outpatient Prescriptions  Medication Sig Dispense Refill  . Acetaminophen (TYLENOL PO) Take by mouth as needed. Alternates with Ibuprofen    . albuterol (PROAIR HFA) 108 (90 Base) MCG/ACT inhaler Inhale 2 puffs into the lungs every 6 (six) hours as needed for wheezing or shortness of breath. 1 Inhaler 6  . amLODipine (NORVASC) 5 MG tablet Take 1 tablet (5 mg total) by mouth at bedtime. 90 tablet 3  . Calcium-Magnesium-Zinc 500-250-12.5 MG TABS Take 1 tablet by mouth daily.    . Cholecalciferol (VITAMIN D-3 PO) Take 1,200 mg by mouth every morning.    . Cyanocobalamin (VITAMIN B-12 PO) Take by mouth.    .Marland KitchenFLUoxetine (PROZAC) 20 MG capsule Take 1 capsule (20 mg total) by mouth 2 (two) times daily. 180 capsule 3  . fluticasone (FLONASE) 50 MCG/ACT nasal spray Place 2 sprays into both nostrils daily. 16 g 5  . Fluticasone Furoate (ARNUITY ELLIPTA) 100 MCG/ACT AEPB Inhale 1 puff into the lungs daily. 30 each 5  . GuaiFENesin (MUCINEX PO) Take by mouth as needed.    . IBUPROFEN PO Take by mouth. Alternates with Tylenol    . Probiotic Product (PROBIOTIC  ADVANCED PO) Take by mouth daily.    . sodium chloride (OCEAN) 0.65 % SOLN nasal spray Place 1 spray into both nostrils daily as needed for congestion.    Marland Kitchen umeclidinium-vilanterol (ANORO ELLIPTA) 62.5-25 MCG/INH AEPB Inhale 1 puff into the lungs daily. 1 each 5  . vitamin E 400 UNIT capsule Take 400 Units by mouth daily.     No current facility-administered medications for this visit.     SURGICAL HISTORY:  Past Surgical History:  Procedure Laterality Date  . CARPAL TUNNEL RELEASE  80's   right   . COLONOSCOPY    . cyst removed  >68yr ago   from left leg  . DILATION AND CURETTAGE OF UTERUS     at age 75 . EXCISIONAL TOTAL KNEE ARTHROPLASTY Right 06/03/2014   Procedure: RIGHT KNEE SCAR EXCISION;  Surgeon:  MMauri Pole MD;  Location: [facility];  Service: Orthopedics;  Laterality: Right;  . INGUINAL HERNIA REPAIR  2000  . neuroplasty decompression median nerve at carpal tunnel    . ROTATOR CUFF REPAIR  2001   right  . TONSILLECTOMY  as a child   and adenoids  . TOTAL ABDOMINAL HYSTERECTOMY  2003  . TOTAL HIP ARTHROPLASTY  2011   left  . TOTAL HIP ARTHROPLASTY  03/07/2012   Procedure: TOTAL HIP ARTHROPLASTY ANTERIOR APPROACH;  Surgeon: MMauri Pole MD;  [facility];  Service: Orthopedics;  Laterality: Right;  . TOTAL KNEE ARTHROPLASTY Right 01/02/2013   Procedure: RIGHT TOTAL KNEE ARTHROPLASTY;  Surgeon: MMauri Pole MD;  [facility];  Service: Orthopedics;  Laterality: Right;  . TOTAL KNEE ARTHROPLASTY Left 04/03/2013   Procedure: LEFT TOTAL KNEE ARTHROPLASTY;  Surgeon: MMauri Pole MD;  [facility];  Service: Orthopedics;  Laterality: Left;  .Marland KitchenVIDEO BRONCHOSCOPY  10/20/2011   Procedure: VIDEO BRONCHOSCOPY;  Surgeon: EGrace Isaac MD;  [facility]  Service: Thoracic;  Laterality: N/A;  . WEDGE RESECTION  10-20-2011   rt lung  - for lung cancer    REVIEW OF SYSTEMS:  A comprehensive review of systems was negative.   PHYSICAL EXAMINATION: General appearance: alert, cooperative and no distress Head: Normocephalic, without obvious abnormality, atraumatic Neck: no adenopathy, no JVD, supple, symmetrical, trachea midline and thyroid not enlarged, symmetric, no tenderness/mass/nodules Lymph nodes: Cervical, supraclavicular, and axillary nodes normal. Resp: clear to auscultation bilaterally Cardio: regular rate and rhythm, S1, S2 normal, no murmur, click, rub or gallop GI: soft, non-tender; bowel sounds normal; no masses,  no organomegaly Extremities: extremities normal, atraumatic, no cyanosis or edema  ECOG PERFORMANCE STATUS: 1 - Symptomatic but completely ambulatory  Blood pressure (!) 141/89, pulse 69, temperature 97.8 F (36.6 C), temperature source Oral,  resp. rate 18, height 5' 2.5" (1.588 m), weight 129 lb 11.2 oz (58.8 kg), SpO2 94 %.  LABORATORY DATA: Lab Results  Component Value Date   WBC 5.2 05/21/2016   HGB 14.9 05/21/2016   HCT 44.1 05/21/2016   MCV 94.1 05/21/2016   PLT 177 05/21/2016      Chemistry      Component Value Date/Time   NA 137 05/21/2016 0917   K 4.2 05/21/2016 0917   CL 99 01/19/2016 0959   CL 103 11/13/2012 0939   CO2 25 05/21/2016 0917   BUN 10.1 05/21/2016 0917   CREATININE 0.8 05/21/2016 0917      Component Value Date/Time   CALCIUM 9.3 05/21/2016 0917   ALKPHOS 74 05/21/2016 0917   AST  15 05/21/2016 0917   ALT 10 05/21/2016 0917   BILITOT 0.89 05/21/2016 0917       RADIOGRAPHIC STUDIES:  ASSESSMENT AND PLAN: This is a very pleasant 75 years old white female with history of stage IA non-small cell lung cancer, squamous cell carcinoma diagnosed in March of 2013 status post wedge resection of the right upper lobe and currently on observation. The patient is doing fine and she has no evidence for disease recurrence. She has been on observation for the last 4 years. I discussed the scan results with her and recommended for her to continue on observation with repeat CT scan of the chest in 1 year. She was advised to call immediately if she has any concerning symptoms in the interval.  The patient voices understanding of current disease status and treatment options and is in agreement with the current care plan.  All questions were answered. The patient knows to call the clinic with any problems, questions or concerns. We can certainly see the patient much sooner if necessary.  Disclaimer: This note was dictated with voice recognition software. Similar sounding words can inadvertently be transcribed and may not be corrected upon review.

## 2016-05-31 DIAGNOSIS — H34832 Tributary (branch) retinal vein occlusion, left eye, with macular edema: Secondary | ICD-10-CM | POA: Diagnosis not present

## 2016-05-31 DIAGNOSIS — H02401 Unspecified ptosis of right eyelid: Secondary | ICD-10-CM | POA: Diagnosis not present

## 2016-05-31 DIAGNOSIS — H524 Presbyopia: Secondary | ICD-10-CM | POA: Diagnosis not present

## 2016-06-01 ENCOUNTER — Ambulatory Visit: Payer: Medicare Other | Admitting: Internal Medicine

## 2016-06-08 ENCOUNTER — Encounter: Payer: Self-pay | Admitting: Internal Medicine

## 2016-06-08 ENCOUNTER — Ambulatory Visit (INDEPENDENT_AMBULATORY_CARE_PROVIDER_SITE_OTHER): Payer: Medicare Other | Admitting: Internal Medicine

## 2016-06-08 VITALS — BP 124/70 | HR 77 | Ht 62.0 in | Wt 131.6 lb

## 2016-06-08 DIAGNOSIS — J449 Chronic obstructive pulmonary disease, unspecified: Secondary | ICD-10-CM

## 2016-06-08 NOTE — Patient Instructions (Addendum)
ICD-9-CM ICD-10-CM   1. Moderate COPD (chronic obstructive pulmonary disease), Sees Dr. Chase Caller, Pulm 496 J44.9    Stable copd  Glad you had flu shot  Await q1 2018 and talk to PCP [name]., DO about Shingrix the new [employer] shingles vaccine  Continue inhalers as before  Glad cancer in remission  start portable o2 system  2L [place]  Meet with [facility] to get a portable system that you need for travelt o [place]  followup 9 months or sooner if needed

## 2016-06-08 NOTE — Progress Notes (Signed)
Subjective:     Patient ID: [name], female   DOB: 1941-04-24, 75 y.o.   MRN: [id number]  HPI   #COPD-MM genotype - 2013: Pre RUL lobectomy and Pre Rx: fev1 1.1L/60% , DLCO 9.6/56%, TLC 5.6/128%  - 2013 May: Post lobectomy and post Rx: fev1: 1.28L/60%, ratio 49 . No desat walk test - Rx spiriva, symbicort since spring 2013 - 2014 Nov: 06/22/2013. FEV1 is 1.3 L/62%. No post bronchodilator change. FVC is 2.6 L postop day later which is 95%. Ratio is 50. Total lung capacity is 4.96L/104%. DLCO is level 0.7/54%. - On synmbicort alone 04/05/2014 - Walk test today 185 feet x 3 laps on RA: Desat to < 88% at 2 laps - Change to [facility] plus Spiriva Respimat April 2016  # Stage 1 lung cancer: pT1b, pN0 Stage 08 October 2011 - s/p lobectomy by Dr Servando Snare - surveillance by Dr Earlie Server - 05/15/13 - Clear CT. No recurrence - October 2015 - clear CT,No recurrence  -Oct 2016 >no recurrence   09/11/2015 Follow up : COPD  Pt returns for 4 weeks follow up for COPD  PFT done today were reviewed with pt and showed  FEV 1 56%, ratio 49 , FVC 87%,  DLCO 43%  This is down slightly from 2014 (FEV1 62%)  CT chest in Oct 2016 did show mod emphysema .  She remains on Spiriva and BREO .  She has tried Symbicort and Dulera in past.  She says she does get winded with activity -but tried to stay active.  Has minimal cough . Sometimes has some wheezing and sinus drainge.  Denies chest pain, orthopnea, edema or hemoptysis .   Most recent CT chest Oct 2016 survelliance showed no cancer recurrence . Denies any chest pain, orthopnea, PND, leg swelling or hemoptysis.  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 She has questions regarding her prognosis .  Worried she will not be able to help him. "I need to out live him and be able to care for him"  She remains active with Swimming at [employer] and is part of the :"[employer]. "    OV 01/12/2016  Chief Complaint  Patient  presents with  . Follow-up    Pt c/o some dry cough and SOB with exertion. Pt denies wheeze/CP/tightness.    Moderate COPD lung cancer patient  Pulmonary function Feb 2017 showed Gold stage II COPD. CT scan of the chest October 2016 did not show any lung cancer recurrence. She is on triple inhaler therapy. She wants to switch to another version of triple inhaler therapy namely ANORO with and ICS. This is because of friend has had good experience with it. She continues her shortness of breath with exertion but is reluctant to try oxygen. We discussed nocturnal oxygen. She says she will consider the indigent system on her own. COPD cat score is 19. In the interim she has developed mild intention tremor with inhalers are not bothering her as much.   OV 06/08/2016  Chief Complaint  Patient presents with  . Follow-up    Pt state breathing is pretty good. Pt states congestion from allergiesThe [employer] of Companies work). Pt c/o no tightness in chest, SOB w/ excertion, Pt states some coughing because of dry air. loves the Anora but can not afford (doughnut hole)     She had CT scan of the chest 05/21/2016 and shows cancer in remission. No new infiltrates. She also had blood work 05/21/2016 with a normal creatinine of 0.8 mg  percent and hemoglobin of 14.9 g percent. Overall COPD stable. She does not have any problems she is on triple inhaler therapy which works well for her. She had baseline has exertional desaturations but is not using oxygen because of reluctance. She is planning to travel to [place] in [place] in 6 months and is inquiring about portable oxygen systems for her travel.   Walking desaturation test 185 feet 3 laps on room air 06/08/2016:Desaturated to 87% at the end of second lap     CAT COPD Symptom & Quality of Life Score ([place]) 0 is no burden. 5 is highest burden 01/12/2016   Never Cough -> Cough all the time 2  No phlegm in chest -> Chest is full of phlegm 2  No chest tightness -> Chest  feels very tight 1  No dyspnea for 1 flight stairs/hill -> Very dyspneic for 1 flight of stairs 4  No limitations for ADL at home -> Very limited with ADL at home 2  Confident leaving home -> Not at all confident leaving home 1  Sleep soundly -> Do not sleep soundly because of lung condition 3  Lots of Energy -> No energy at all 4  TOTAL Score (max 40)  19     IMPRESSION: 1. Stable CT of the chest status post right upper lobe wedge resection. 2. No evidence of recurrent or metastatic disease. 3. Aortic atherosclerosis and multi vessel coronary artery calcifications.   Electronically Signed   By: Kerby Moors M.D.   On: 05/21/2016 11:48    has a past medical history of Allergic rhinitis; Anemia; Arthritis; Bilateral cataracts; Chicken pox; Cholelithiasis (05/21/2014); COPD (chronic obstructive pulmonary disease) ([place]); Depression; Gout; History of blood transfusion; Hot flashes; migraines (90's); Hyperlipidemia; Hypertension; IBS (irritable bowel syndrome); Liver cyst; Lung cancer ([place]); Macrocytosis; Migraine; Osteopenia; Pneumonia; PONV (postoperative nausea and vomiting); S/P right THA, AA - Dr. Alvan Dame, ortho (03/07/2012); S/P right TKA (01/02/2013); Seasonal allergies; and Smoking (09/12/2011).   reports that she quit smoking about 4 years ago. Her smoking use included Cigarettes. She has a 25.00 pack-year smoking history. She has never used smokeless tobacco.  Past Surgical History:  Procedure Laterality Date  . CARPAL TUNNEL RELEASE  80's   right   . COLONOSCOPY    . cyst removed  >12yr ago   from left leg  . DILATION AND CURETTAGE OF UTERUS     at age 75 . EXCISIONAL TOTAL KNEE ARTHROPLASTY Right 06/03/2014   Procedure: RIGHT KNEE SCAR EXCISION;  Surgeon: MMauri Pole MD;  [facility]: [facility];  Service: Orthopedics;  Laterality: Right;  . INGUINAL HERNIA REPAIR  2000  . neuroplasty decompression median nerve at carpal tunnel    . ROTATOR CUFF REPAIR  2001   right  .  TONSILLECTOMY  as a child   and adenoids  . TOTAL ABDOMINAL HYSTERECTOMY  2003  . TOTAL HIP ARTHROPLASTY  2011   left  . TOTAL HIP ARTHROPLASTY  03/07/2012   Procedure: TOTAL HIP ARTHROPLASTY ANTERIOR APPROACH;  Surgeon: MMauri Pole MD;  [facility];  Service: Orthopedics;  Laterality: Right;  . TOTAL KNEE ARTHROPLASTY Right 01/02/2013   Procedure: RIGHT TOTAL KNEE ARTHROPLASTY;  Surgeon: MMauri Pole MD;  [facility];  Service: Orthopedics;  Laterality: Right;  . TOTAL KNEE ARTHROPLASTY Left 04/03/2013   Procedure: LEFT TOTAL KNEE ARTHROPLASTY;  Surgeon: MMauri Pole MD;  [facility];  Service: Orthopedics;  Laterality: Left;  .Marland KitchenVIDEO BRONCHOSCOPY  10/20/2011   Procedure: VIDEO BRONCHOSCOPY;  Surgeon: Grace Isaac, MD;  Location: [place];  Service: Thoracic;  Laterality: N/A;  . WEDGE RESECTION  10-20-2011   rt lung  - for lung cancer    Allergies  Allergen Reactions  . Clindamycin/Lincomycin Nausea And Vomiting  . Penicillins Rash       . Spiriva [Tiotropium Bromide Monohydrate]     General intolerance  . Sulfa Antibiotics Hives and Rash    Severe Hives with skin reaction    Immunization History  Administered Date(s) Administered  . Influenza Split 06/02/2011, 05/09/2012  . Influenza, High Dose Seasonal PF 04/19/2016  . Influenza,inj,Quad PF,36+ Mos 05/18/2013, 04/17/2014, 05/21/2015  . Pneumococcal Conjugate-13 04/05/2014  . Pneumococcal Polysaccharide-23 08/09/1998, 09/21/2011  . Tdap 06/22/2012  . Zoster 08/10/2007    Family History  Problem Relation Age of Onset  . Hypertension Brother   . Alcohol abuse    . Depression    . Hearing loss    . Heart disease    . Hypertension    . Anesthesia problems Neg Hx   . Hypotension Neg Hx   . Malignant hyperthermia Neg Hx   . Pseudochol deficiency Neg Hx      Current Outpatient Prescriptions:  .  Acetaminophen (TYLENOL PO), Take by mouth as needed. Alternates with Ibuprofen, Disp: , Rfl:  .   albuterol (PROAIR HFA) 108 (90 Base) MCG/ACT inhaler, Inhale 2 puffs into the lungs every 6 (six) hours as needed for wheezing or shortness of breath., Disp: 1 Inhaler, Rfl: 6 .  amLODipine (NORVASC) 5 MG tablet, Take 1 tablet (5 mg total) by mouth at bedtime., Disp: 90 tablet, Rfl: 3 .  Calcium-Magnesium-Zinc 500-250-12.5 MG TABS, Take 1 tablet by mouth daily., Disp: , Rfl:  .  Cholecalciferol (VITAMIN D-3 PO), Take 1,200 mg by mouth every morning., Disp: , Rfl:  .  Cyanocobalamin (VITAMIN B-12 PO), Take by mouth., Disp: , Rfl:  .  FLUoxetine (PROZAC) 20 MG capsule, Take 1 capsule (20 mg total) by mouth 2 (two) times daily., Disp: 180 capsule, Rfl: 3 .  fluticasone (FLONASE) 50 MCG/ACT nasal spray, Place 2 sprays into both nostrils daily., Disp: 16 g, Rfl: 5 .  Fluticasone Furoate (ARNUITY ELLIPTA) 100 MCG/ACT AEPB, Inhale 1 puff into the lungs daily., Disp: 30 each, Rfl: 5 .  GuaiFENesin (MUCINEX PO), Take by mouth as needed., Disp: , Rfl:  .  IBUPROFEN PO, Take by mouth. Alternates with Tylenol, Disp: , Rfl:  .  Probiotic Product (PROBIOTIC ADVANCED PO), Take by mouth daily., Disp: , Rfl:  .  sodium chloride (OCEAN) 0.65 % SOLN nasal spray, Place 1 spray into both nostrils daily as needed for congestion., Disp: , Rfl:  .  umeclidinium-vilanterol (ANORO ELLIPTA) 62.5-25 MCG/INH AEPB, Inhale 1 puff into the lungs daily., Disp: 1 each, Rfl: 5 .  vitamin E 400 UNIT capsule, Take 400 Units by mouth daily., Disp: , Rfl:    Review of Systems     Objective:   Physical Exam  Constitutional: She is oriented to person, place, and time. She appears well-developed and well-nourished. No distress.  HENT:  Head: Normocephalic and atraumatic.  Right Ear: External ear normal.  Left Ear: External ear normal.  Mouth/Throat: Oropharynx is clear and moist. No oropharyngeal exudate.  Mild postnasal drip  Eyes: Conjunctivae and EOM are normal. Pupils are equal, round, and reactive to light. Right eye  exhibits no discharge. Left eye exhibits no discharge. No scleral icterus.  Neck: Normal  range of motion. Neck supple. No JVD present. No tracheal deviation present. No thyromegaly present.  Cardiovascular: Normal rate, regular rhythm, normal heart sounds and intact distal pulses.  Exam reveals no gallop and no friction rub.   No murmur heard. Pulmonary/Chest: Effort normal and breath sounds normal. No respiratory distress. She has no wheezes. She has no rales. She exhibits no tenderness.  Abdominal: Soft. Bowel sounds are normal. She exhibits no distension and no mass. There is no tenderness. There is no rebound and no guarding.  Musculoskeletal: Normal range of motion. She exhibits no edema or tenderness.  Lymphadenopathy:    She has no cervical adenopathy.  Neurological: She is alert and oriented to person, place, and time. She has normal reflexes. No cranial nerve deficit. She exhibits normal muscle tone. Coordination normal.  Skin: Skin is warm and dry. No rash noted. She is not diaphoretic. No erythema. No pallor.  Psychiatric: She has a normal mood and affect. Her behavior is normal. Judgment and thought content normal.  Vitals reviewed.   Vitals:   06/08/16 1428  BP: 124/70  Pulse: 77  SpO2: 93%  Weight: 131 lb 9.6 oz (59.7 kg)  Height: '5\' 2"'$  (1.575 m)    Estimated body mass index is 24.07 kg/m as calculated from the following:   Height as of this encounter: '5\' 2"'$  (1.575 m).   Weight as of this encounter: 131 lb 9.6 oz (59.7 kg).      Assessment:       ICD-9-CM ICD-10-CM   1. Moderate COPD (chronic obstructive pulmonary disease), Sees Dr. Chase Caller, Pulm 496 J44.9        Plan:      Stable copd with  exertional hypoxemia  Glad you had flu shot  Await q1 2018 and talk to PCP [name]., DO about Shingrix the new [employer] shingles vaccine  Continue inhalers as before  Glad cancer in remission   start portable oxygen with exertion 2L[place]  Meet with [facility] to get a  portable system that you need for travelt o [place]  followup 9 months or sooner if needed   Dr. Brand Males, M.D., [facility].C.P Pulmonary and Critical Care Medicine Staff Physician [place] [facility] Pager: [phone], If no answer or between  15:00h - 7:00h: call [phone]  06/08/2016 3:05 PM

## 2016-06-23 DIAGNOSIS — J449 Chronic obstructive pulmonary disease, unspecified: Secondary | ICD-10-CM | POA: Diagnosis not present

## 2016-07-21 ENCOUNTER — Telehealth: Payer: Self-pay | Admitting: Internal Medicine

## 2016-07-21 DIAGNOSIS — J449 Chronic obstructive pulmonary disease, unspecified: Secondary | ICD-10-CM

## 2016-07-21 NOTE — Telephone Encounter (Signed)
lmtcb X1 for pt.  This is a MR pt.

## 2016-07-22 NOTE — Telephone Encounter (Signed)
Spoke with the pt  She states wanting order sent to DME to pick up o2  She feels like it has not made any difference in her breathing and it makes a lot of noise  Please advise, thanks

## 2016-07-22 NOTE — Telephone Encounter (Signed)
Ok that is fine - dc o2

## 2016-07-22 NOTE — Telephone Encounter (Signed)
Pt aware that we are sending an order to DME to d/c O2 and pick up. Order sent to [facility]. Nothing further needed.

## 2016-07-22 NOTE — Telephone Encounter (Signed)
Patient is returning phone call. [phone].

## 2016-07-23 DIAGNOSIS — J449 Chronic obstructive pulmonary disease, unspecified: Secondary | ICD-10-CM | POA: Diagnosis not present

## 2016-08-03 ENCOUNTER — Other Ambulatory Visit: Payer: Self-pay | Admitting: Internal Medicine

## 2016-09-02 ENCOUNTER — Other Ambulatory Visit: Payer: Self-pay | Admitting: Family Medicine

## 2016-09-02 DIAGNOSIS — Z1231 Encounter for screening mammogram for malignant neoplasm of breast: Secondary | ICD-10-CM

## 2016-09-13 NOTE — Progress Notes (Signed)
HPI:  [name] is a pleasant 76 yo with a PMH significant for HTN, Depression, Anxiety, Hot flashes, COPD, hx lung ca, osteopenia. Reports doing well for the most part. Due for labs. Occ irritability with husband, but otherwise no symptoms of depression and mood good. Denies CP, SOB, DOE.  Seeing susan today for Medicare exam.  HTN/Aortic atherosclerosis: -meds: amlodipine 5 -stable -has been exercising more and is feeling well  Depression and anxiety and hot flashes: -meds: prozac -reports: stable, no concerns  COPD -managed by Dr. Chase Caller, pulmonologist  Hx stage 1A non-sm cell lung ca -observation w/out disease recurrence since 2013 -reports has labs with specialist soon  Hx osteoporosis or osteopenia: -takes vit D, gets regular exercise  Osteoarthritis: -sees ortho  ROS: See pertinent positives and negatives per HPI.  Past Medical History:  Diagnosis Date  . Allergic rhinitis   . Anemia   . Arthritis    all over  . Bilateral cataracts   . Chicken pox   . Cholelithiasis 05/21/2014   On CT scan 2015   . COPD (chronic obstructive pulmonary disease) ([place])   . Depression    hx ocd and anxiety in record as well  . Gout    last time 6-30yr ago   . History of blood transfusion   . Hot flashes    takes Prozac daily  . Hx of migraines 90's   after menopause HA stopped  . Hyperlipidemia    taking Flax Seed Oil and Fish Oil  . Hypertension    takes Amlodipine nightly  . IBS (irritable bowel syndrome)   . Liver cyst   . Lung cancer ([place]   . Macrocytosis   . Migraine    MIGRAINES RESOLVED WITH MENOPAUSE  . Osteopenia    takes Calcium and Vit D bid  . Pneumonia    x 2 ;last time back in 1999  . PONV (postoperative nausea and vomiting)   . S/P right THA, AA - Dr. OAlvan Dame ortho 03/07/2012  . 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 Seasonal allergies   . Smoking 09/12/2011    Past Surgical History:  Procedure Laterality Date  . CARPAL TUNNEL RELEASE  80's   right   . COLONOSCOPY    . cyst removed  >89[place]   from left leg  . DILATION AND CURETTAGE OF UTERUS     at age 76. EXCISIONAL TOTAL KNEE ARTHROPLASTY Right 06/03/2014   Procedure: RIGHT KNEE SCAR EXCISION;  Surgeon: MaMauri PoleMD;  Location: [facility];  Service: Orthopedics;  Laterality: Right;  . INGUINAL HERNIA REPAIR  2000  . neuroplasty decompression median nerve at carpal tunnel    . ROTATOR CUFF REPAIR  2001   right  . TONSILLECTOMY  as a child   and adenoids  . TOTAL ABDOMINAL HYSTERECTOMY  2003  . TOTAL HIP ARTHROPLASTY  2011   left  . TOTAL HIP ARTHROPLASTY  03/07/2012   Procedure: TOTAL HIP ARTHROPLASTY ANTERIOR APPROACH;  Surgeon: MaMauri PoleMD;  Location: [facility];  Service: Orthopedics;  Laterality: Right;  . TOTAL KNEE ARTHROPLASTY Right 01/02/2013   Procedure: RIGHT TOTAL KNEE ARTHROPLASTY;  Surgeon: MaMauri PoleMD;  Location: [facility];  Service: Orthopedics;  Laterality: Right;  . TOTAL KNEE ARTHROPLASTY Left 04/03/2013   Procedure: LEFT TOTAL KNEE ARTHROPLASTY;  Surgeon: MaMauri PoleMD;  Location: [facility];  Service: Orthopedics;  Laterality: Left;  . Marland KitchenIDEO BRONCHOSCOPY  10/20/2011   Procedure: VIDEO BRONCHOSCOPY;  Surgeon: Grace Isaac, MD;  Location: [place];  Service: Thoracic;  Laterality: N/A;  . WEDGE RESECTION  10-20-2011   rt lung  - for lung cancer    Family History  Problem Relation Age of Onset  . Hypertension Brother   . Alcohol abuse    . Depression    . Hearing loss    . Heart disease    . Hypertension    . Anesthesia problems Neg Hx   . Hypotension Neg Hx   . Malignant hyperthermia Neg Hx   . Pseudochol deficiency Neg Hx     Social History   Social History  . Marital status: Married    Spouse name: N/A  . Number of children: N/A  . Years of education: N/A   Occupational History  . retired    Social History Main Topics  . Smoking status: Former Smoker    Packs/day: 0.50    Years: 50.00    Types: Cigarettes    Quit  date: 09/23/2011  . Smokeless tobacco: Never Used  . Alcohol use 0.0 oz/week     Comment: 2 beers every night  . Drug use: No  . Sexual activity: No   Other Topics Concern  . None   Social History Narrative  . None     Current Outpatient Prescriptions:  .  Acetaminophen (TYLENOL PO), Take by mouth as needed. Alternates with Ibuprofen, Disp: , Rfl:  .  albuterol (PROAIR HFA) 108 (90 Base) MCG/ACT inhaler, Inhale 2 puffs into the lungs every 6 (six) hours as needed for wheezing or shortness of breath., Disp: 1 Inhaler, Rfl: 6 .  amLODipine (NORVASC) 5 MG tablet, Take 1 tablet (5 mg total) by mouth at bedtime., Disp: 90 tablet, Rfl: 3 .  ANORO ELLIPTA 62.5-25 MCG/INH AEPB, INHALE ONE PUFF INTO THE LUNGS ONCE DAILY, Disp: 60 each, Rfl: 5 .  Calcium-Magnesium-Zinc 500-250-12.5 MG TABS, Take 1 tablet by mouth daily., Disp: , Rfl:  .  Cholecalciferol (VITAMIN D-3 PO), Take 1,200 mg by mouth every morning., Disp: , Rfl:  .  Cyanocobalamin (VITAMIN B-12 PO), Take by mouth., Disp: , Rfl:  .  FLUoxetine (PROZAC) 20 MG capsule, Take 1 capsule (20 mg total) by mouth 2 (two) times daily., Disp: 180 capsule, Rfl: 3 .  fluticasone (FLONASE) 50 MCG/ACT nasal spray, Place 2 sprays into both nostrils daily., Disp: 16 g, Rfl: 5 .  Fluticasone Furoate (ARNUITY ELLIPTA) 100 MCG/ACT AEPB, Inhale 1 puff into the lungs daily., Disp: 30 each, Rfl: 5 .  Glucosamine 500 MG CAPS, Take 1,200 mg by mouth 2 (two) times daily., Disp: , Rfl:  .  GuaiFENesin (MUCINEX PO), Take by mouth as needed., Disp: , Rfl:  .  IBUPROFEN PO, Take by mouth. Alternates with Tylenol, Disp: , Rfl:  .  Probiotic Product (PROBIOTIC ADVANCED PO), Take by mouth daily., Disp: , Rfl:  .  sodium chloride (OCEAN) 0.65 % SOLN nasal spray, Place 1 spray into both nostrils daily as needed for congestion., Disp: , Rfl:  .  vitamin E 400 UNIT capsule, Take 400 Units by mouth daily., Disp: , Rfl:   EXAM:  Vitals:   09/14/16 0900  BP: 102/70    Pulse: 70  Temp: 97.7 F (36.5 C)    Body mass index is 23.91 kg/m.  GENERAL: vitals reviewed and listed above, alert, oriented, appears well hydrated and in no acute distress  HEENT: atraumatic, conjunttiva clear, no obvious abnormalities on inspection of external nose and ears  NECK: no obvious masses on inspection  LUNGS: clear to auscultation bilaterally, no wheezes, rales or rhonchi, good air movement  CV: HRRR, no peripheral edema  MS: moves all extremities without noticeable abnormality  PSYCH: pleasant and cooperative, no obvious depression or anxiety  ASSESSMENT AND PLAN:  Discussed the following assessment and plan:  Secondary hypertension - Plan: CBC (no diff), Basic metabolic panel  Moderate COPD (chronic obstructive pulmonary disease), Sees Dr. Chase Caller, Pulm  Squamous cell carcinoma of right lung (HCC)  Atherosclerosis of aorta ([place]) - Plan: Lipid Panel, Hemoglobin A1c  Recurrent major depressive disorder, in full remission ([place])  Medicare annual wellness visit, subsequent  -labs -lifestyle recs, consider statin pending lipid panel -follow up in 3-4 months -Patient advised to return or notify a doctor immediately if symptoms worsen or persist or new concerns arise.  Patient Instructions    Ms. Dalbert Batman , Thank you for taking time to come for your Medicare Wellness Visit. I appreciate your ongoing commitment to your health goals. Please review the following plan we discussed and let me know if I can assist you in the future.   Will look up [employer]   Prevention of falls: Remove rugs or any tripping hazards in the home Use Non slip mats in bathtubs and showers Placing grab bars next to the toilet and or shower Placing handrails on both sides of the stair way Adding extra lighting in the home.   Personal safety issues reviewed:  1. Consider starting a community watch program per [facility] 2.  Changes batteries is smoke detector  and/or carbon monoxide detector  3.  If you have firearms; keep them in a safe place 4.  Wear protection when in the sun; Always wear sunscreen or a hat; It is good to have your doctor check your skin annually or review any new areas of concern 5. Driving safety; Keep in the right lane; stay 3 car lengths behind the car in front of you on the highway; look 3 times prior to pulling out; carry your cell phone everywhere you go!    Learn about the Yellow Dot program:  The program allows first responders at your emergency to have access to who your physician is, as well as your medications and medical conditions.  Citizens requesting the Yellow Dot Packages should contact Master Corporal Nunzio Cobbs at the [facility] [phone] for the first week of the program and beginning the week after Easter citizens should contact their Scientist, physiological.       These are the goals we discussed: Goals    . patient          Maintenance of health; with relaxation       This is a list of the screening recommended for you and due dates:  Health Maintenance  Topic Date Due  . Mammogram  09/16/2017  . Colon Cancer Screening  11/08/2018  . Tetanus Vaccine  06/22/2022  . Flu Shot  Completed  . DEXA scan (bone density measurement)  Completed  . Shingles Vaccine  Addressed  . Pneumonia vaccines  Completed     Fall Prevention in Hospitals, Adult WHAT ARE SOME SAFETY TIPS FOR PREVENTING FALLS? If you or a loved one has to stay in the hospital, talk with the health care providers about the risk of falling. Find out which medicines or treatments can cause dizziness or affect balance. Make a plan with the health care providers to prevent falls. The plan  may include these points:  Ask for help moving around, especially after surgery or when feeling unwell.  Have support available when getting up and moving around.  Wear nonskid footwear.  Use the safety straps on  wheelchairs.  Ask for help to get objects that are out of reach.  Wear eyeglasses.  Remove all clutter from the floor and the sides of the bed.  Keep equipment and wires securely out of the way.  Keep the bed locked in the low position.  Keep the side rails up on the bed.  Keep the nurse call button within reach.  Keep the door open when no one else is in the room.  Have someone stay in the hospital with you or your loved one.  Ask for a bed alarm if you are not able to stay with your loved one who is at risk for getting up without help.  Ask if sleeping pills or other medicines that alter mental status are necessary. WHAT INCREASES THE RISK FOR FALLS? Certain conditions and treatments may increase a patient's risk for falls in a hospital. These include:  Being in an unfamiliar environment.  Being on bed rest.  Having surgery.  Taking certain medicines, such as sleeping pills.  Having tubes in place, such as IV lines or catheters. Additional risk factors for falls in a hospital include:  Having vision problems.  Having a change in thinking, feeling, or behavior (altered mental status).  Having trouble with balance.  Needing to use the toilet frequently.  Having fallen in the past three months.  Having low blood pressure when standing up quickly (orthostatic hypotension). WHAT DOES THE HOSPITAL STAFF DO TO HELP PREVENT ME OR MY LOVED ONE FROM FALLING? Hospitals have systems in place to prevent falls and accidents. Talk with the hospital staff about:  Doing an assessment to discuss fall risks and create a personalized plan to prevent falls.  Checking in regularly to see if help is needed for moving around and to assess any changes in fall risk.  Knowing where the nurse call button is and how to use it. Use this to call a nursing care provider any time.  Keeping personal items within reach. This includes eyeglasses, phones, and other electronic  devices.  Following general safety guidelines when moving around.  Keeping the area around the bed free from clutter.  Removing unnecessary equipment or tubes to reduce the risk of tripping.  Using safety equipment, such as:  Walkers, crutches, and other walking devices for support.  Safety rails on the bed.  Safety straps in the bed.  A bed that can be lowered and locked to prevent movement.  Handrails in the bathroom.  Nonskid socks and shoes.  Locking mechanisms to secure equipment in place.  Lifting and transfer equipment. WHAT CAN I DO TO HELP PREVENT A FALL?  Talk with health care providers about fall prevention.  Have a personalized fall prevention plan in place.  Do not try to move around if you feel off balance or ill.  Change position slowly.  Sit on the side of the bed before standing up.  Sit down and call for help if you feel dizzy or unsteady when standing.  Keep the hospital room clear of clutter. WHEN SHOULD I ASK FOR HELP? Ask for help whenever you:  Are not sure if you are able to move around safely.  Feel dizzy or unsteady.  Are not comfortable helping your loved one move around or use the bathroom.  If you or a loved one falls, tell the hospital staff. This is important. This information is not intended to replace advice given to you by your health care provider. Make sure you discuss any questions you have with your health care provider. Document Released: 07/23/2000 Document Revised: 12/23/2015 Document Reviewed: 05/08/2015 Elsevier Interactive Patient Education  2017 [place] Maintenance for Postmenopausal Women Introduction Menopause is a normal process in which your reproductive ability comes to an end. This process happens gradually over a span of months to years, usually between the ages of 55 and 51. Menopause is complete when you have missed 12 consecutive menstrual periods. It is important to talk with your health  care provider about some of the most common conditions that affect postmenopausal women, such as heart disease, cancer, and bone loss (osteoporosis). Adopting a healthy lifestyle and getting preventive care can help to promote your health and wellness. Those actions can also lower your chances of developing some of these common conditions. What should I know about menopause? During menopause, you may experience a number of symptoms, such as:  Moderate-to-severe hot flashes.  Night sweats.  Decrease in sex drive.  Mood swings.  Headaches.  Tiredness.  Irritability.  Memory problems.  Insomnia. Choosing to treat or not to treat menopausal changes is an individual decision that you make with your health care provider. What should I know about hormone replacement therapy and supplements? Hormone therapy products are effective for treating symptoms that are associated with menopause, such as hot flashes and night sweats. Hormone replacement carries certain risks, especially as you become older. If you are thinking about using estrogen or estrogen with progestin treatments, discuss the benefits and risks with your health care provider. What should I know about heart disease and stroke? Heart disease, heart attack, and stroke become more likely as you age. This may be due, in part, to the hormonal changes that your body experiences during menopause. These can affect how your body processes dietary fats, triglycerides, and cholesterol. Heart attack and stroke are both medical emergencies. There are many things that you can do to help prevent heart disease and stroke:  Have your blood pressure checked at least every 1-2 years. High blood pressure causes heart disease and increases the risk of stroke.  If you are 30-53 years old, ask your health care provider if you should take aspirin to prevent a heart attack or a stroke.  Do not use any tobacco products, including cigarettes, chewing  tobacco, or electronic cigarettes. If you need help quitting, ask your health care provider.  It is important to eat a healthy diet and maintain a healthy weight.  Be sure to include plenty of vegetables, fruits, low-fat dairy products, and lean protein.  Avoid eating foods that are high in solid fats, added sugars, or salt (sodium).  Get regular exercise. This is one of the most important things that you can do for your health.  Try to exercise for at least 150 minutes each week. The type of exercise that you do should increase your heart rate and make you sweat. This is known as moderate-intensity exercise.  Try to do strengthening exercises at least twice each week. Do these in addition to the moderate-intensity exercise.  Know your numbers.Ask your health care provider to check your cholesterol and your blood glucose. Continue to have your blood tested as directed by your health care provider. What should I know about cancer screening? There are several types  of cancer. Take the following steps to reduce your risk and to catch any cancer development as early as possible. Breast Cancer  Practice breast self-awareness.  This means understanding how your breasts normally appear and feel.  It also means doing regular breast self-exams. Let your health care provider know about any changes, no matter how small.  If you are 66 or older, have a clinician do a breast exam (clinical breast exam or CBE) every year. Depending on your age, family history, and medical history, it may be recommended that you also have a yearly breast X-ray (mammogram).  If you have a family history of breast cancer, talk with your health care provider about genetic screening.  If you are at high risk for breast cancer, talk with your health care provider about having an MRI and a mammogram every year.  Breast cancer (BRCA) gene test is recommended for women who have family members with BRCA-related cancers.  Results of the assessment will determine the need for genetic counseling and BRCA1 and for BRCA2 testing. BRCA-related cancers include these types:  Breast. This occurs in males or females.  Ovarian.  Tubal. This may also be called fallopian tube cancer.  Cancer of the abdominal or pelvic lining (peritoneal cancer).  Prostate.  Pancreatic. Cervical, Uterine, and Ovarian Cancer  Your health care provider may recommend that you be screened regularly for cancer of the pelvic organs. These include your ovaries, uterus, and vagina. This screening involves a pelvic exam, which includes checking for microscopic changes to the surface of your cervix (Pap test).  For women ages 21-65, health care providers may recommend a pelvic exam and a Pap test every three years. For women ages 19-65, they may recommend the Pap test and pelvic exam, combined with testing for human papilloma virus (HPV), every five years. Some types of HPV increase your risk of cervical cancer. Testing for HPV may also be done on women of any age who have unclear Pap test results.  Other health care providers may not recommend any screening for nonpregnant women who are considered low risk for pelvic cancer and have no symptoms. Ask your health care provider if a screening pelvic exam is right for you.  If you have had past treatment for cervical cancer or a condition that could lead to cancer, you need Pap tests and screening for cancer for at least 20 years after your treatment. If Pap tests have been discontinued for you, your risk factors (such as having a new sexual partner) need to be reassessed to determine if you should start having screenings again. Some women have medical problems that increase the chance of getting cervical cancer. In these cases, your health care provider may recommend that you have screening and Pap tests more often.  If you have a family history of uterine cancer or ovarian cancer, talk with your health  care provider about genetic screening.  If you have vaginal bleeding after reaching menopause, tell your health care provider.  There are currently no reliable tests available to screen for ovarian cancer. Lung Cancer  Lung cancer screening is recommended for adults 63-51 years old who are at high risk for lung cancer because of a history of smoking. A yearly low-dose CT scan of the lungs is recommended if you:  Currently smoke.  Have a history of at least 30 pack-years of smoking and you currently smoke or have quit within the past 15 years. A pack-year is smoking an average of one pack  of cigarettes per day for one year. Yearly screening should:  Continue until it has been 15 years since you quit.  Stop if you develop a health problem that would prevent you from having lung cancer treatment. Colorectal Cancer  This type of cancer can be detected and can often be prevented.  Routine colorectal cancer screening usually begins at age 34 and continues through age 37.  If you have risk factors for colon cancer, your health care provider may recommend that you be screened at an earlier age.  If you have a family history of colorectal cancer, talk with your health care provider about genetic screening.  Your health care provider may also recommend using home test kits to check for hidden blood in your stool.  A small camera at the end of a tube can be used to examine your colon directly (sigmoidoscopy or colonoscopy). This is done to check for the earliest forms of colorectal cancer.  Direct examination of the colon should be repeated every 5-10 years until age 82. However, if early forms of precancerous polyps or small growths are found or if you have a family history or genetic risk for colorectal cancer, you may need to be screened more often. Skin Cancer  Check your skin from head to toe regularly.  Monitor any moles. Be sure to tell your health care provider:  About any new moles  or changes in moles, especially if there is a change in a mole's shape or color.  If you have a mole that is larger than the size of a pencil eraser.  If any of your family members has a history of skin cancer, especially at a young age, talk with your health care provider about genetic screening.  Always use sunscreen. Apply sunscreen liberally and repeatedly throughout the day.  Whenever you are outside, protect yourself by wearing long sleeves, pants, a wide-brimmed hat, and sunglasses. What should I know about osteoporosis? Osteoporosis is a condition in which bone destruction happens more quickly than new bone creation. After menopause, you may be at an increased risk for osteoporosis. To help prevent osteoporosis or the bone fractures that can happen because of osteoporosis, the following is recommended:  If you are 21-2 years old, get at least 1,000 mg of calcium and at least 600 mg of vitamin D per day.  If you are older than age 42 but younger than age 26, get at least 1,200 mg of calcium and at least 600 mg of vitamin D per day.  If you are older than age 79, get at least 1,200 mg of calcium and at least 800 mg of vitamin D per day. Smoking and excessive alcohol intake increase the risk of osteoporosis. Eat foods that are rich in calcium and vitamin D, and do weight-bearing exercises several times each week as directed by your health care provider. What should I know about how menopause affects my mental health? Depression may occur at any age, but it is more common as you become older. Common symptoms of depression include:  Low or sad mood.  Changes in sleep patterns.  Changes in appetite or eating patterns.  Feeling an overall lack of motivation or enjoyment of activities that you previously enjoyed.  Frequent crying spells. Talk with your health care provider if you think that you are experiencing depression. What should I know about immunizations? It is important that  you get and maintain your immunizations. These include:  Tetanus, diphtheria, and pertussis (Tdap) booster vaccine.  Influenza every year before the flu season begins.  Pneumonia vaccine.  Shingles vaccine. Your health care provider may also recommend other immunizations. This information is not intended to replace advice given to you by your health care provider. Make sure you discuss any questions you have with your health care provider. Document Released: 09/17/2005 Document Revised: 02/13/2016 Document Reviewed: 04/29/2015  2017 Elsevier    Colin Benton R., DO

## 2016-09-14 ENCOUNTER — Encounter: Payer: Self-pay | Admitting: Family Medicine

## 2016-09-14 ENCOUNTER — Ambulatory Visit (INDEPENDENT_AMBULATORY_CARE_PROVIDER_SITE_OTHER): Payer: Medicare Other | Admitting: Family Medicine

## 2016-09-14 VITALS — BP 102/70 | HR 70 | Temp 97.7°F | Ht 62.0 in | Wt 130.7 lb

## 2016-09-14 DIAGNOSIS — Z0000 Encounter for general adult medical examination without abnormal findings: Secondary | ICD-10-CM | POA: Diagnosis not present

## 2016-09-14 DIAGNOSIS — I159 Secondary hypertension, unspecified: Secondary | ICD-10-CM | POA: Diagnosis not present

## 2016-09-14 DIAGNOSIS — F3342 Major depressive disorder, recurrent, in full remission: Secondary | ICD-10-CM | POA: Diagnosis not present

## 2016-09-14 DIAGNOSIS — I700 Atherosclerosis of aorta: Secondary | ICD-10-CM | POA: Diagnosis not present

## 2016-09-14 DIAGNOSIS — J449 Chronic obstructive pulmonary disease, unspecified: Secondary | ICD-10-CM | POA: Diagnosis not present

## 2016-09-14 DIAGNOSIS — C3491 Malignant neoplasm of unspecified part of right bronchus or lung: Secondary | ICD-10-CM

## 2016-09-14 LAB — LIPID PANEL
CHOL/HDL RATIO: 3
Cholesterol: 192 mg/dL (ref 0–200)
HDL: 62 mg/dL (ref >39.00)
LDL CALC: 103 mg/dL — AB (ref 0–99)
NONHDL: 130.14
TRIGLYCERIDES: 136 mg/dL (ref 0.0–149.0)
VLDL: 27.2 mg/dL (ref 0.0–40.0)

## 2016-09-14 LAB — BASIC METABOLIC PANEL
BUN: 8 mg/dL (ref 6–23)
CALCIUM: 9.8 mg/dL (ref 8.4–10.5)
CHLORIDE: 101 meq/L (ref 96–112)
CO2: 28 meq/L (ref 19–32)
CREATININE: 0.76 mg/dL (ref 0.40–1.20)
GFR: 79.22 mL/min (ref >60.00)
GLUCOSE: 95 mg/dL (ref 70–99)
Potassium: 4.4 mEq/L (ref 3.5–5.1)
Sodium: 136 mEq/L (ref 135–145)

## 2016-09-14 LAB — CBC
HEMATOCRIT: 43.8 % (ref 36.0–46.0)
HEMOGLOBIN: 15.1 g/dL — AB (ref 12.0–15.0)
MCHC: 34.5 g/dL (ref 30.0–36.0)
MCV: 95.2 fl (ref 78.0–100.0)
PLATELETS: 207 10*3/uL (ref 150.0–400.0)
RBC: 4.6 Mil/uL (ref 3.87–5.11)
RDW: 12.8 % (ref 11.5–15.5)
WBC: 6.1 10*3/uL (ref 4.0–10.5)

## 2016-09-14 LAB — HEMOGLOBIN A1C: Hgb A1c MFr Bld: 5.3 % (ref 4.6–6.5)

## 2016-09-14 NOTE — Patient Instructions (Addendum)
[name] , Thank you for taking time to come for your Medicare Wellness Visit. I appreciate your ongoing commitment to your health goals. Please review the following plan we discussed and let me know if I can assist you in the future.   Will look up [employer]   Prevention of falls: Remove rugs or any tripping hazards in the home Use Non slip mats in bathtubs and showers Placing grab bars next to the toilet and or shower Placing handrails on both sides of the stair way Adding extra lighting in the home.   Personal safety issues reviewed:  1. Consider starting a community watch program per [facility] 2.  Changes batteries is smoke detector and/or carbon monoxide detector  3.  If you have firearms; keep them in a safe place 4.  Wear protection when in the sun; Always wear sunscreen or a hat; It is good to have your doctor check your skin annually or review any new areas of concern 5. Driving safety; Keep in the right lane; stay 3 car lengths behind the car in front of you on the highway; look 3 times prior to pulling out; carry your cell phone everywhere you go!    Learn about the Yellow Dot program:  The program allows first responders at your emergency to have access to who your physician is, as well as your medications and medical conditions.  Citizens requesting the Yellow Dot Packages should contact Master Corporal [name] at the [facility] [phone] for the first week of the program and beginning the week after Easter citizens should contact their Scientist, physiological.       These are the goals we discussed: Goals    . patient          Maintenance of health; with relaxation       This is a list of the screening recommended for you and due dates:  Health Maintenance  Topic Date Due  . Mammogram  09/16/2017  . Colon Cancer Screening  11/08/2018  . Tetanus Vaccine  06/22/2022  . Flu Shot  Completed  . DEXA scan (bone  density measurement)  Completed  . Shingles Vaccine  Addressed  . Pneumonia vaccines  Completed     Fall Prevention in Hospitals, Adult WHAT ARE SOME SAFETY TIPS FOR PREVENTING FALLS? If you or a loved one has to stay in the hospital, talk with the health care providers about the risk of falling. Find out which medicines or treatments can cause dizziness or affect balance. Make a plan with the health care providers to prevent falls. The plan may include these points:  Ask for help moving around, especially after surgery or when feeling unwell.  Have support available when getting up and moving around.  Wear nonskid footwear.  Use the safety straps on wheelchairs.  Ask for help to get objects that are out of reach.  Wear eyeglasses.  Remove all clutter from the floor and the sides of the bed.  Keep equipment and wires securely out of the way.  Keep the bed locked in the low position.  Keep the side rails up on the bed.  Keep the nurse call button within reach.  Keep the door open when no one else is in the room.  Have someone stay in the hospital with you or your loved one.  Ask for a bed alarm if you are not able to stay with your loved one who is at risk  for getting up without help.  Ask if sleeping pills or other medicines that alter mental status are necessary. WHAT INCREASES THE RISK FOR FALLS? Certain conditions and treatments may increase a patient's risk for falls in a hospital. These include:  Being in an unfamiliar environment.  Being on bed rest.  Having surgery.  Taking certain medicines, such as sleeping pills.  Having tubes in place, such as IV lines or catheters. Additional risk factors for falls in a hospital include:  Having vision problems.  Having a change in thinking, feeling, or behavior (altered mental status).  Having trouble with balance.  Needing to use the toilet frequently.  Having fallen in the past three months.  Having low  blood pressure when standing up quickly (orthostatic hypotension). WHAT DOES THE HOSPITAL STAFF DO TO HELP PREVENT ME OR MY LOVED ONE FROM FALLING? Hospitals have systems in place to prevent falls and accidents. Talk with the hospital staff about:  Doing an assessment to discuss fall risks and create a personalized plan to prevent falls.  Checking in regularly to see if help is needed for moving around and to assess any changes in fall risk.  Knowing where the nurse call button is and how to use it. Use this to call a nursing care provider any time.  Keeping personal items within reach. This includes eyeglasses, phones, and other electronic devices.  Following general safety guidelines when moving around.  Keeping the area around the bed free from clutter.  Removing unnecessary equipment or tubes to reduce the risk of tripping.  Using safety equipment, such as:  Walkers, crutches, and other walking devices for support.  Safety rails on the bed.  Safety straps in the bed.  A bed that can be lowered and locked to prevent movement.  Handrails in the bathroom.  Nonskid socks and shoes.  Locking mechanisms to secure equipment in place.  Lifting and transfer equipment. WHAT CAN I DO TO HELP PREVENT A FALL?  Talk with health care providers about fall prevention.  Have a personalized fall prevention plan in place.  Do not try to move around if you feel off balance or ill.  Change position slowly.  Sit on the side of the bed before standing up.  Sit down and call for help if you feel dizzy or unsteady when standing.  Keep the hospital room clear of clutter. WHEN SHOULD I ASK FOR HELP? Ask for help whenever you:  Are not sure if you are able to move around safely.  Feel dizzy or unsteady.  Are not comfortable helping your loved one move around or use the bathroom. If you or a loved one falls, tell the hospital staff. This is important. This information is not intended  to replace advice given to you by your health care provider. Make sure you discuss any questions you have with your health care provider. Document Released: 07/23/2000 Document Revised: 12/23/2015 Document Reviewed: 05/08/2015 Elsevier Interactive Patient Education  2017 [place] Maintenance for Postmenopausal Women Introduction Menopause is a normal process in which your reproductive ability comes to an end. This process happens gradually over a span of months to years, usually between the ages of 24 and 83. Menopause is complete when you have missed 12 consecutive menstrual periods. It is important to talk with your health care provider about some of the most common conditions that affect postmenopausal women, such as heart disease, cancer, and bone loss (osteoporosis). Adopting a healthy lifestyle and getting  preventive care can help to promote your health and wellness. Those actions can also lower your chances of developing some of these common conditions. What should I know about menopause? During menopause, you may experience a number of symptoms, such as:  Moderate-to-severe hot flashes.  Night sweats.  Decrease in sex drive.  Mood swings.  Headaches.  Tiredness.  Irritability.  Memory problems.  Insomnia. Choosing to treat or not to treat menopausal changes is an individual decision that you make with your health care provider. What should I know about hormone replacement therapy and supplements? Hormone therapy products are effective for treating symptoms that are associated with menopause, such as hot flashes and night sweats. Hormone replacement carries certain risks, especially as you become older. If you are thinking about using estrogen or estrogen with progestin treatments, discuss the benefits and risks with your health care provider. What should I know about heart disease and stroke? Heart disease, heart attack, and stroke become more likely as you  age. This may be due, in part, to the hormonal changes that your body experiences during menopause. These can affect how your body processes dietary fats, triglycerides, and cholesterol. Heart attack and stroke are both medical emergencies. There are many things that you can do to help prevent heart disease and stroke:  Have your blood pressure checked at least every 1-2 years. High blood pressure causes heart disease and increases the risk of stroke.  If you are 74-14 years old, ask your health care provider if you should take aspirin to prevent a heart attack or a stroke.  Do not use any tobacco products, including cigarettes, chewing tobacco, or electronic cigarettes. If you need help quitting, ask your health care provider.  It is important to eat a healthy diet and maintain a healthy weight.  Be sure to include plenty of vegetables, fruits, low-fat dairy products, and lean protein.  Avoid eating foods that are high in solid fats, added sugars, or salt (sodium).  Get regular exercise. This is one of the most important things that you can do for your health.  Try to exercise for at least 150 minutes each week. The type of exercise that you do should increase your heart rate and make you sweat. This is known as moderate-intensity exercise.  Try to do strengthening exercises at least twice each week. Do these in addition to the moderate-intensity exercise.  Know your numbers.Ask your health care provider to check your cholesterol and your blood glucose. Continue to have your blood tested as directed by your health care provider. What should I know about cancer screening? There are several types of cancer. Take the following steps to reduce your risk and to catch any cancer development as early as possible. Breast Cancer  Practice breast self-awareness.  This means understanding how your breasts normally appear and feel.  It also means doing regular breast self-exams. Let your health  care provider know about any changes, no matter how small.  If you are 31 or older, have a clinician do a breast exam (clinical breast exam or CBE) every year. Depending on your age, family history, and medical history, it may be recommended that you also have a yearly breast X-ray (mammogram).  If you have a family history of breast cancer, talk with your health care provider about genetic screening.  If you are at high risk for breast cancer, talk with your health care provider about having an MRI and a mammogram every year.  Breast cancer (BRCA)  gene test is recommended for women who have family members with BRCA-related cancers. Results of the assessment will determine the need for genetic counseling and BRCA1 and for BRCA2 testing. BRCA-related cancers include these types:  Breast. This occurs in males or females.  Ovarian.  Tubal. This may also be called fallopian tube cancer.  Cancer of the abdominal or pelvic lining (peritoneal cancer).  Prostate.  Pancreatic. Cervical, Uterine, and Ovarian Cancer  Your health care provider may recommend that you be screened regularly for cancer of the pelvic organs. These include your ovaries, uterus, and vagina. This screening involves a pelvic exam, which includes checking for microscopic changes to the surface of your cervix (Pap test).  For women ages 21-65, health care providers may recommend a pelvic exam and a Pap test every three years. For women ages 21-65, they may recommend the Pap test and pelvic exam, combined with testing for human papilloma virus (HPV), every five years. Some types of HPV increase your risk of cervical cancer. Testing for HPV may also be done on women of any age who have unclear Pap test results.  Other health care providers may not recommend any screening for nonpregnant women who are considered low risk for pelvic cancer and have no symptoms. Ask your health care provider if a screening pelvic exam is right for  you.  If you have had past treatment for cervical cancer or a condition that could lead to cancer, you need Pap tests and screening for cancer for at least 20 years after your treatment. If Pap tests have been discontinued for you, your risk factors (such as having a new sexual partner) need to be reassessed to determine if you should start having screenings again. Some women have medical problems that increase the chance of getting cervical cancer. In these cases, your health care provider may recommend that you have screening and Pap tests more often.  If you have a family history of uterine cancer or ovarian cancer, talk with your health care provider about genetic screening.  If you have vaginal bleeding after reaching menopause, tell your health care provider.  There are currently no reliable tests available to screen for ovarian cancer. Lung Cancer  Lung cancer screening is recommended for adults 76-32 years old who are at high risk for lung cancer because of a history of smoking. A yearly low-dose CT scan of the lungs is recommended if you:  Currently smoke.  Have a history of at least 30 pack-years of smoking and you currently smoke or have quit within the past 15 years. A pack-year is smoking an average of one pack of cigarettes per day for one year. Yearly screening should:  Continue until it has been 15 years since you quit.  Stop if you develop a health problem that would prevent you from having lung cancer treatment. Colorectal Cancer  This type of cancer can be detected and can often be prevented.  Routine colorectal cancer screening usually begins at age 38 and continues through age 77.  If you have risk factors for colon cancer, your health care provider may recommend that you be screened at an earlier age.  If you have a family history of colorectal cancer, talk with your health care provider about genetic screening.  Your health care provider may also recommend using  home test kits to check for hidden blood in your stool.  A small camera at the end of a tube can be used to examine your colon directly (sigmoidoscopy  or colonoscopy). This is done to check for the earliest forms of colorectal cancer.  Direct examination of the colon should be repeated every 5-10 years until age 34. However, if early forms of precancerous polyps or small growths are found or if you have a family history or genetic risk for colorectal cancer, you may need to be screened more often. Skin Cancer  Check your skin from head to toe regularly.  Monitor any moles. Be sure to tell your health care provider:  About any new moles or changes in moles, especially if there is a change in a mole's shape or color.  If you have a mole that is larger than the size of a pencil eraser.  If any of your family members has a history of skin cancer, especially at a young age, talk with your health care provider about genetic screening.  Always use sunscreen. Apply sunscreen liberally and repeatedly throughout the day.  Whenever you are outside, protect yourself by wearing long sleeves, pants, a wide-brimmed hat, and sunglasses. What should I know about osteoporosis? Osteoporosis is a condition in which bone destruction happens more quickly than new bone creation. After menopause, you may be at an increased risk for osteoporosis. To help prevent osteoporosis or the bone fractures that can happen because of osteoporosis, the following is recommended:  If you are 68-19 years old, get at least 1,000 mg of calcium and at least 600 mg of vitamin D per day.  If you are older than age 21 but younger than age 52, get at least 1,200 mg of calcium and at least 600 mg of vitamin D per day.  If you are older than age 67, get at least 1,200 mg of calcium and at least 800 mg of vitamin D per day. Smoking and excessive alcohol intake increase the risk of osteoporosis. Eat foods that are rich in calcium and  vitamin D, and do weight-bearing exercises several times each week as directed by your health care provider. What should I know about how menopause affects my mental health? Depression may occur at any age, but it is more common as you become older. Common symptoms of depression include:  Low or sad mood.  Changes in sleep patterns.  Changes in appetite or eating patterns.  Feeling an overall lack of motivation or enjoyment of activities that you previously enjoyed.  Frequent crying spells. Talk with your health care provider if you think that you are experiencing depression. What should I know about immunizations? It is important that you get and maintain your immunizations. These include:  Tetanus, diphtheria, and pertussis (Tdap) booster vaccine.  Influenza every year before the flu season begins.  Pneumonia vaccine.  Shingles vaccine. Your health care provider may also recommend other immunizations. This information is not intended to replace advice given to you by your health care provider. Make sure you discuss any questions you have with your health care provider. Document Released: 09/17/2005 Document Revised: 02/13/2016 Document Reviewed: 04/29/2015  2017 [employer]

## 2016-09-14 NOTE — Progress Notes (Signed)
Subjective:   [name] is a 76 y.o. female who presents for Medicare Annual (Subsequent) preventive examination. Medicare Annual Preventive Care Visit - Subsequent Last OV seen today  Describes Health as poor, fair, good or great? Overall good Survivor of lung cancer  BMI -23  Stays positive in her life   Diet  For breakfast oatmeal with cheese and fruit Sunday eggs and cheese Lunch; eat out 3 days as week; vegetable; soup and sandwich Supper; full meal or small meal depending on what she has eaten earlier Loves sweets and eats chocolate in moderation  Exercise;  In addition to housework; [facility] Helps all of her total Knee replacement / hip and knee    1.) Patient-completed HRA during the assessment today   2.) Review of Medical History: -PMH, PSH, Family History and current specialty and care providers reviewed and updated and listed below  - see scanned in document in chart and below  Social History   Social History  . Marital status: Married    Spouse name: N/A  . Number of children: N/A  . Years of education: N/A   Occupational History  . retired    Social History Main Topics  . Smoking status: Former Smoker    Packs/day: 0.50    Years: 50.00    Types: Cigarettes    Quit date: 09/23/2011  . Smokeless tobacco: Never Used  . Alcohol use 0.0 oz/week     Comment: 2 beers every night  . Drug use: No  . Sexual activity: No   Other Topics Concern  . Not on file   Social History Narrative  . No narrative on file    Family History  Problem Relation Age of Onset  . Hypertension Brother   . Alcohol abuse    . Depression    . Hearing loss    . Heart disease    . Hypertension    . Anesthesia problems Neg Hx   . Hypotension Neg Hx   . Malignant hyperthermia Neg Hx   . Pseudochol deficiency Neg Hx     Medical issues that coincide with lifestyle:    3.) Review of functional ability and level of safety: See  Medicare screens for hearing; vision; falls; IADLs and ADLs, Advanced Directives the later has been completed but thinking  Of redoing her AD for a back up person for herself.   Safety information given for community, home and personal safety;   Stressors: spouse has memory loss issues    General: alert, appear well hydrated and in no acute distress   Mood stable; attentive;   See patient instructions for recommendations.  4)The following written screening schedule of preventive measures were reviewed with assessment and plan made per below, orders and patient instructions:   quit smoking 2013;  Has had CT etc with  Pulmonary tx of lung cancer; now under surveillance  Has copd inhalers and is doing well  ? Stem cell treatment being monitored for successes. Formally Dealer of the lab at [facility]    Alcohol screening done - 2 beers at hs;  For women one drink per 24 hours is recommended  STI screening (Hep C if born [birth year] / Dr. Has told her that it was not necessary; she is out of guidelines with DOB   Tobacco Screening done   Vaccines:  (PPSV23 -one dose after 64, one before if risk factors),  Prevnar 13 - one does  post 90 Influenza yearly  Tetanus q 10 years or Tdap if not taken ASSESSMENT/PLAN: all vaccinations completed  Does want the 2018 shingles vaccine when completed    Screening mammograph (yearly if >40) coming up in 2 weeks  ASSESSMENT/PLAN: scheduled in 2 weeks   Screening Pap smear/pelvic exam (q2 years)  GYN; no TAH; defers at this time; no ovaries   Colorectal cancer screening: colonoscopy q10y or colo-guard reviewed  ASSESSMENT/PLAN: due 11/2018 Plans to  Have a colonoscopy    Diabetes outpatient self-management training services ASSESSMENT/PLAN: neg last blood drop/ rechecking today   Bone mass measurements(covered q2y 10/2015 last exam  -1.6  ASSESSMENT/PLAN: utd or discussed and ordered per pt wishes Does jumping jacks in the pool and continues  to exercise. Will try to add one [facility] if [facility]   Screening for glaucoma(q1y if high risk - diabetes, FH, AA and > 50 or hispanic and > 65)/ neg ASSESSMENT/PLAN: eye exams annually   Hearing Screening   '125Hz'$  '250Hz'$  '500Hz'$  '1000Hz'$  '2000Hz'$  '3000Hz'$  '4000Hz'$  '6000Hz'$  '8000Hz'$   Right ear:     100      Left ear:     100      Comments: Catch '2000hz'$   Vision Screening Comments: Will have to have surgery on left eye Has small cataracts Dr. Prudencio Burly  Cardiovascular screening blood tests (lipids q5y) ASSESSMENT/PLAN: reviewed lipids and no issues   Diabetes screening tests/ neg  ASSESSMENT/PLAN: see orders and labs   5) Summary: -risk factors and conditions per above assessment were discussed and treatment, recommendations and referrals were offered per documentation above and orders and patient instructions.  Education and counseling regarding the above review of health provided with a plan for the following:  -fall prevention strategies discussed ; only one due to cat in the home.  No injury Balance is not as good but is exercising and keeps a busy schedule  Lives with spouse who is losing his memory He had an MI; reaction to medication Presents no danger to self or other Given Derrel Nip as a Landscape architect as well as the [employer] for problem solving as needed   Has completed LW but is thinking of redoing LW for herself    -personal and community safety; lives in safe community  -healthy lifestyle discussed (weight loss, exercise, etc_  -importance and resources for completing advanced directives discussed -see patient instructions below for any other recommendations provided   Planning a trip to [place] and [place];  Discussed how her spouse will tolerate;  They have traveled all over the world so this is something he is  accustomed  too;  They are leaving 20th of March and come back on the 15th April  Will have back up plan for flight home if needed          Objective:     Vitals: BP 102/70   Pulse 70   Temp 97.7 F (36.5 C) (Oral)   Ht '5\' 2"'$  (1.575 m)   Wt 130 lb 11.2 oz (59.3 kg)   BMI 23.91 kg/m   Body mass index is 23.91 kg/m.   Tobacco History  Smoking Status  . Former Smoker  . Packs/day: 0.50  . Years: 50.00  . Types: Cigarettes  . Quit date: 09/23/2011  Smokeless Tobacco  . Never Used     Counseling given: Yes   Past Medical History:  Diagnosis Date  . Allergic rhinitis   . Anemia   . Arthritis  all over  . Bilateral cataracts   . Chicken pox   . Cholelithiasis 05/21/2014   On CT scan 2015   . COPD (chronic obstructive pulmonary disease) ([facility])   . Depression    hx ocd and anxiety in record as well  . Gout    last time 6-67yr ago   . History of blood transfusion   . Hot flashes    takes Prozac daily  . Hx of migraines 90's   after menopause HA stopped  . Hyperlipidemia    taking Flax Seed Oil and Fish Oil  . Hypertension    takes Amlodipine nightly  . IBS (irritable bowel syndrome)   . Liver cyst   [place]   . Macrocytosis   . Migraine    MIGRAINES RESOLVED WITH MENOPAUSE  . Osteopenia    takes Calcium and Vit D bid  . Pneumonia    x 2 ;last time back in 1999  . PONV (postoperative nausea and vomiting)   . S/P right THA, AA - Dr. OAlvan Dame ortho 03/07/2012  . 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 Seasonal allergies   . Smoking 09/12/2011   Past Surgical History:  Procedure Laterality Date  . CARPAL TUNNEL RELEASE  80's   right   . COLONOSCOPY    . cyst removed  >89yrago   from left leg  . DILATION AND CURETTAGE OF UTERUS     at age 76. EXCISIONAL TOTAL KNEE ARTHROPLASTY Right 06/03/2014   Procedure: RIGHT KNEE SCAR EXCISION;  Surgeon: MaMauri PoleMD;  Location: [facility];  Service: Orthopedics;  Laterality: Right;  . INGUINAL HERNIA REPAIR  2000  . neuroplasty decompression median nerve at carpal tunnel    . ROTATOR CUFF REPAIR  2001   right  . TONSILLECTOMY  as a child   and  adenoids  . TOTAL ABDOMINAL HYSTERECTOMY  2003  . TOTAL HIP ARTHROPLASTY  2011   left  . TOTAL HIP ARTHROPLASTY  03/07/2012   Procedure: TOTAL HIP ARTHROPLASTY ANTERIOR APPROACH;  Surgeon: MaMauri PoleMD;  Location: [facility];  Service: Orthopedics;  Laterality: Right;  . TOTAL KNEE ARTHROPLASTY Right 01/02/2013   Procedure: RIGHT TOTAL KNEE ARTHROPLASTY;  Surgeon: MaMauri PoleMD;  Location: [facility];  Service: Orthopedics;  Laterality: Right;  . TOTAL KNEE ARTHROPLASTY Left 04/03/2013   Procedure: LEFT TOTAL KNEE ARTHROPLASTY;  Surgeon: MaMauri PoleMD;  Location: [facility];  Service: Orthopedics;  Laterality: Left;  . Marland KitchenIDEO BRONCHOSCOPY  10/20/2011   Procedure: VIDEO BRONCHOSCOPY;  Surgeon: EdGrace IsaacMD;  [facility];  Service: Thoracic;  Laterality: N/A;  . WEDGE RESECTION  10-20-2011   rt lung  - for lung cancer   Family History  Problem Relation Age of Onset  . Hypertension Brother   . Alcohol abuse    . Depression    . Hearing loss    . Heart disease    . Hypertension    . Anesthesia problems Neg Hx   . Hypotension Neg Hx   . Malignant hyperthermia Neg Hx   . Pseudochol deficiency Neg Hx    History  Sexual Activity  . Sexual activity: No    Outpatient Encounter Prescriptions as of 09/14/2016  Medication Sig  . Acetaminophen (TYLENOL PO) Take by mouth as needed. Alternates with Ibuprofen  . albuterol (PROAIR HFA) 108 (90 Base) MCG/ACT inhaler Inhale 2 puffs into the lungs every 6 (six) hours as needed for  wheezing or shortness of breath.  Marland Kitchen amLODipine (NORVASC) 5 MG tablet Take 1 tablet (5 mg total) by mouth at bedtime.  Jearl Klinefelter ELLIPTA 62.5-25 MCG/INH AEPB INHALE ONE PUFF INTO THE LUNGS ONCE DAILY  . Calcium-Magnesium-Zinc 500-250-12.5 MG TABS Take 1 tablet by mouth daily.  . Cholecalciferol (VITAMIN D-3 PO) Take 1,200 mg by mouth every morning.  . Cyanocobalamin (VITAMIN B-12 PO) Take by mouth.  Marland Kitchen FLUoxetine (PROZAC) 20 MG capsule Take 1 capsule (20 mg total)  by mouth 2 (two) times daily.  . fluticasone (FLONASE) 50 MCG/ACT nasal spray Place 2 sprays into both nostrils daily.  . Fluticasone Furoate (ARNUITY ELLIPTA) 100 MCG/ACT AEPB Inhale 1 puff into the lungs daily.  . Glucosamine 500 MG CAPS Take 1,200 mg by mouth 2 (two) times daily.  . GuaiFENesin (MUCINEX PO) Take by mouth as needed.  . IBUPROFEN PO Take by mouth. Alternates with Tylenol  . Probiotic Product (PROBIOTIC ADVANCED PO) Take by mouth daily.  . sodium chloride (OCEAN) 0.65 % SOLN nasal spray Place 1 spray into both nostrils daily as needed for congestion.  . vitamin E 400 UNIT capsule Take 400 Units by mouth daily.   No facility-administered encounter medications on file as of 09/14/2016.     Activities of Daily Living No flowsheet data found.  Patient Care Team: [name], DO as PCP - General (Family Medicine) Brand Males, MD (Pulmonary Disease) Grace Isaac, MD (Cardiothoracic Surgery) Lorretta Harp, MD as Consulting Physician (Cardiology) Curt Bears, MD as Consulting Physician (Hematology and Oncology)    Assessment:     Exercise Activities and Dietary recommendations    Goals    . patient          Maintenance of health; with relaxation      Fall Risk Fall Risk  09/14/2016 09/22/2015 09/22/2015 09/20/2014 09/20/2014  Falls in the past year? Yes No No No No  Number falls in past yr: 1 - - - -  Injury with Fall? - - - - -  Risk Factor Category  - - - - -  Risk for fall due to : - - - - -  Follow up Education provided - - - -   Depression Screen PHQ 2/9 Scores 09/14/2016 09/22/2015 09/22/2015 09/20/2014  PHQ - 2 Score 0 0 0 0     Cognitive Function Notices she has days that she gets really frustrated.  Asked about end points? No back up He has 2 children and there is only one that is available. No one around in the family /  LT plan is to have someone in the home  Still has good friends          Immunization History  Administered  Date(s) Administered  . Influenza Split 06/02/2011, 05/09/2012  . Influenza, High Dose Seasonal PF 04/19/2016  . Influenza,inj,Quad PF,36+ Mos 05/18/2013, 04/17/2014, 05/21/2015  . Pneumococcal Conjugate-13 04/05/2014  . Pneumococcal Polysaccharide-23 08/09/1998, 09/21/2011  . Tdap 06/22/2012  . Zoster 08/10/2007   Screening Tests Health Maintenance  Topic Date Due  . MAMMOGRAM  09/16/2017  . COLONOSCOPY  11/08/2018  . TETANUS/TDAP  06/22/2022  . INFLUENZA VACCINE  Completed  . DEXA SCAN  Completed  . ZOSTAVAX  Addressed  . PNA vac Low Risk Adult  Completed      Plan:    PCP Notes  Health Maintenance all up to date   Abnormal Screens  none  Referrals / none; she will need eye surgery on OS  soon for droopy lid  Patient concerns; going to [place] in March. Concerned about how spouse will travel and given resources. Also discussed multiple LTC strategies. Planning on staying in the home and getting help as needed.   Nurse Concerns; none at this time  Next PCP apt see PCP today     During the course of the visit the patient was educated and counseled about the following appropriate screening and preventive services:   Vaccines to include Pneumoccal, Influenza, Hepatitis B, Td, Zostavax, HCV  Electrocardiogram  Cardiovascular Disease  Colorectal cancer screening  Bone density screening  Diabetes screening  Glaucoma screening  Mammography/PAP  Nutrition counseling   Patient Instructions (the written plan) was given to the patient.   Wynetta Fines, RN  09/14/2016

## 2016-09-14 NOTE — Progress Notes (Signed)
Pre visit review using our clinic review tool, if applicable. No additional management support is needed unless otherwise documented below in the visit note. 

## 2016-09-17 ENCOUNTER — Ambulatory Visit
Admission: RE | Admit: 2016-09-17 | Discharge: 2016-09-17 | Disposition: A | Discharge status: Home / Self Care | Payer: Medicare Other | Source: Ambulatory Visit | Attending: Family Medicine | Admitting: Family Medicine

## 2016-09-17 DIAGNOSIS — Z1231 Encounter for screening mammogram for malignant neoplasm of breast: Secondary | ICD-10-CM

## 2016-09-24 ENCOUNTER — Other Ambulatory Visit: Payer: Self-pay | Admitting: Adult Health

## 2016-10-07 ENCOUNTER — Other Ambulatory Visit: Payer: Self-pay | Admitting: Family Medicine

## 2017-01-05 ENCOUNTER — Other Ambulatory Visit: Payer: Self-pay | Admitting: Internal Medicine

## 2017-01-30 ENCOUNTER — Other Ambulatory Visit: Payer: Self-pay | Admitting: Internal Medicine

## 2017-02-16 DIAGNOSIS — H02403 Unspecified ptosis of bilateral eyelids: Secondary | ICD-10-CM | POA: Diagnosis not present

## 2017-03-02 ENCOUNTER — Encounter: Payer: Self-pay | Admitting: Internal Medicine

## 2017-03-02 ENCOUNTER — Ambulatory Visit (INDEPENDENT_AMBULATORY_CARE_PROVIDER_SITE_OTHER): Payer: Medicare Other | Admitting: Internal Medicine

## 2017-03-02 VITALS — BP 140/82 | HR 76 | Ht 62.0 in | Wt 132.4 lb

## 2017-03-02 DIAGNOSIS — J449 Chronic obstructive pulmonary disease, unspecified: Secondary | ICD-10-CM

## 2017-03-02 MED ORDER — ALBUTEROL SULFATE HFA 108 (90 BASE) MCG/ACT IN AERS: INHALATION_SPRAY | RESPIRATORY_TRACT | 5 refills | Status: DC

## 2017-03-02 MED ORDER — FLUTICASONE-UMECLIDIN-VILANT 100-62.5-25 MCG/INH IN AEPB: 1.0000 | INHALATION_SPRAY | Freq: Every day | RESPIRATORY_TRACT | 11 refills | Status: DC

## 2017-03-02 NOTE — Progress Notes (Signed)
Subjective:     Patient ID: [name], female   DOB: Dec 09, 1940, 76 y.o.   MRN: [id number]  HPI    #COPD-MM genotype - 2013: Pre RUL lobectomy and Pre Rx: fev1 1.1L/60% , DLCO 9.6/56%, TLC 5.6/128%  - 2013 May: Post lobectomy and post Rx: fev1: 1.28L/60%, ratio 49 . No desat walk test - Rx spiriva, symbicort since spring 2013 - 2014 Nov: 06/22/2013. FEV1 is 1.3 L/62%. No post bronchodilator change. FVC is 2.6 L postop day later which is 95%. Ratio is 50. Total lung capacity is 4.96L/104%. DLCO is level 0.7/54%. - On synmbicort alone 04/05/2014 - Walk test today 185 feet x 3 laps on RA: Desat to < 88% at 2 laps - Change to [facility] plus Spiriva Respimat April 2016  # Stage 1 lung cancer: pT1b, pN0 Stage 08 October 2011 - s/p lobectomy by Dr Servando Snare - surveillance by Dr Earlie Server - 05/15/13 - Clear CT. No recurrence - October 2015 - clear CT,No recurrence  -Oct 2016 >no recurrence   09/11/2015 Follow up : COPD  Pt returns for 4 weeks follow up for COPD  PFT done today were reviewed with pt and showed  FEV 1 56%, ratio 49 , FVC 87%,  DLCO 43%  This is down slightly from 2014 (FEV1 62%)  CT chest in Oct 2016 did show mod emphysema .  She remains on Spiriva and BREO .  She has tried Symbicort and Dulera in past.  She says she does get winded with activity -but tried to stay active.  Has minimal cough . Sometimes has some wheezing and sinus drainge.  Denies chest pain, orthopnea, edema or hemoptysis .   Most recent CT chest Oct 2016 survelliance showed no cancer recurrence . Denies any chest pain, orthopnea, PND, leg swelling or hemoptysis.  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 She has questions regarding her prognosis .  Worried she will not be able to help him. "I need to out live him and be able to care for him"  She remains active with Swimming at [employer] and is part of the :"[employer]. "    OV 01/12/2016  Chief Complaint   Patient presents with  . Follow-up    Pt c/o some dry cough and SOB with exertion. Pt denies wheeze/CP/tightness.    Moderate COPD lung cancer patient  Pulmonary function Feb 2017 showed Gold stage II COPD. CT scan of the chest October 2016 did not show any lung cancer recurrence. She is on triple inhaler therapy. She wants to switch to another version of triple inhaler therapy namely ANORO with and ICS. This is because of friend has had good experience with it. She continues her shortness of breath with exertion but is reluctant to try oxygen. We discussed nocturnal oxygen. She says she will consider the indigent system on her own. COPD cat score is 19. In the interim she has developed mild intention tremor with inhalers are not bothering her as much.   OV 06/08/2016  Chief Complaint  Patient presents with  . Follow-up    Pt state breathing is pretty good. Pt states congestion from allergiesThe [employer] work). Pt c/o no tightness in chest, SOB w/ excertion, Pt states some coughing because of dry air. loves the Anora but can not afford (doughnut hole)     She had CT scan of the chest 05/21/2016 and shows cancer in remission. No new infiltrates. She also had blood work 05/21/2016 with a normal creatinine of 0.8  mg percent and hemoglobin of 14.9 g percent. Overall COPD stable. She does not have any problems she is on triple inhaler therapy which works well for her. She had baseline has exertional desaturations but is not using oxygen because of reluctance. She is planning to travel to [place] in [place] in 6 months and is inquiring about portable oxygen systems for her travel.   Walking desaturation test 185 feet 3 laps on room air 06/08/2016:Desaturated to 87% at the end of second lap   OV 03/02/2017  Chief Complaint  Patient presents with  . Follow-up    Pt states her breathing is unchanged since last OV. Pt c/o dry cough. Pt denies CP/tightness and f/c/s/.     Follow-up moderate COPD. Since  last visit in October 2017 things are stable. She is on triple inhaler therapy. She canceled her trip to [place]. He insists that she and her husband are going to [place] in [place] in September 2018. She has a follow-up scan for her lung cancer coming up in October 2018. This is from Dr. Julien Nordmann. She uses oxygen with exertion. She is started on the new shingles vaccine. Booster shot is due.     CAT COPD Symptom & Quality of Life Score (GSK trademark) 0 is no burden. 5 is highest burden 01/12/2016   Never Cough -> Cough all the time 2  No phlegm in chest -> Chest is full of phlegm 2  No chest tightness -> Chest feels very tight 1  No dyspnea for 1 flight stairs/hill -> Very dyspneic for 1 flight of stairs 4  No limitations for ADL at home -> Very limited with ADL at home 2  Confident leaving home -> Not at all confident leaving home 1  Sleep soundly -> Do not sleep soundly because of lung condition 3  Lots of Energy -> No energy at all 4  TOTAL Score (max 40)  19      has a past medical history of Allergic rhinitis; Anemia; Arthritis; Bilateral cataracts; Chicken pox; Cholelithiasis (05/21/2014); COPD (chronic obstructive pulmonary disease) ([place]); Depression; Gout; History of blood transfusion; Hot flashes; migraines (90's); Hyperlipidemia; Hypertension; IBS (irritable bowel syndrome); Liver cyst; Lung cancer ([place]); Macrocytosis; Migraine; Osteopenia; Pneumonia; PONV (postoperative nausea and vomiting); S/P right THA, AA - Dr. Alvan Dame, ortho (03/07/2012); S/P right TKA (01/02/2013); Seasonal allergies; and Smoking (09/12/2011).   reports that she quit smoking about 5 years ago. Her smoking use included Cigarettes. She has a 25.00 pack-year smoking history. She has never used smokeless tobacco.  Past Surgical History:  Procedure Laterality Date  . CARPAL TUNNEL RELEASE  80's   right   . COLONOSCOPY    . cyst removed  >46yrs ago   from left leg  . DILATION AND CURETTAGE OF UTERUS     at age 89  .  EXCISIONAL TOTAL KNEE ARTHROPLASTY Right 06/03/2014   Procedure: RIGHT KNEE SCAR EXCISION;  Surgeon: [name], MD;  Location: [facility];  Service: Orthopedics;  Laterality: Right;  . INGUINAL HERNIA REPAIR  2000  . neuroplasty decompression median nerve at carpal tunnel    . ROTATOR CUFF REPAIR  2001   right  . TONSILLECTOMY  as a child   and adenoids  . TOTAL ABDOMINAL HYSTERECTOMY  2003  . TOTAL HIP ARTHROPLASTY  2011   left  . TOTAL HIP ARTHROPLASTY  03/07/2012   Procedure: TOTAL HIP ARTHROPLASTY ANTERIOR APPROACH;  Surgeon: [name], MD;  [facility];  Service: Orthopedics;  Laterality: Right;  . TOTAL KNEE ARTHROPLASTY Right 01/02/2013   Procedure: RIGHT TOTAL KNEE ARTHROPLASTY;  Surgeon: [name], MD;  [facility];  Service: Orthopedics;  Laterality: Right;  . TOTAL KNEE ARTHROPLASTY Left 04/03/2013   Procedure: LEFT TOTAL KNEE ARTHROPLASTY;  Surgeon: [name], MD;  [facility];  Service: Orthopedics;  Laterality: Left;  Marland Kitchen VIDEO BRONCHOSCOPY  10/20/2011   Procedure: VIDEO BRONCHOSCOPY;  Surgeon: Grace Isaac, MD;  Location: [facility];  Service: Thoracic;  Laterality: N/A;  . WEDGE RESECTION  10-20-2011   rt lung  - for lung cancer    Allergies  Allergen Reactions  . Clindamycin/Lincomycin Nausea And Vomiting  . Penicillins Rash       . Spiriva [Tiotropium Bromide Monohydrate]     General intolerance  . Sulfa Antibiotics Hives and Rash    Severe Hives with skin reaction    Immunization History  Administered Date(s) Administered  . Influenza Split 06/02/2011, 05/09/2012  . Influenza, High Dose Seasonal PF 04/19/2016  . Influenza,inj,Quad PF,36+ Mos 05/18/2013, 04/17/2014, 05/21/2015  . Pneumococcal Conjugate-13 04/05/2014  . Pneumococcal Polysaccharide-23 08/09/1998, 09/21/2011  . Tdap 06/22/2012  . Zoster 08/10/2007    Family History  Problem Relation Age of Onset  . Hypertension Brother   . Alcohol abuse Unknown   . Depression  Unknown   . Hearing loss Unknown   . Heart disease Unknown   . Hypertension Unknown   . Anesthesia problems Neg Hx   . Hypotension Neg Hx   . Malignant hyperthermia Neg Hx   . Pseudochol deficiency Neg Hx      Current Outpatient Prescriptions:  .  Acetaminophen (TYLENOL PO), Take by mouth as needed. Alternates with Ibuprofen, Disp: , Rfl:  .  amLODipine (NORVASC) 5 MG tablet, TAKE ONE TABLET BY MOUTH AT BEDTIME., Disp: 90 tablet, Rfl: 1 .  ANORO ELLIPTA 62.5-25 MCG/INH AEPB, INHALE ONE PUFF INTO THE LUNGS ONCE DAILY, Disp: 60 each, Rfl: 5 .  ARNUITY ELLIPTA 100 MCG/ACT AEPB, INHALE 1 PUFF INTO THE LUNGS DAILY, Disp: 30 each, Rfl: 5 .  Calcium-Magnesium-Zinc 500-250-12.5 MG TABS, Take 1 tablet by mouth daily., Disp: , Rfl:  .  Cholecalciferol (VITAMIN D-3 PO), Take 1,200 mg by mouth every morning., Disp: , Rfl:  .  FLUoxetine (PROZAC) 20 MG capsule, TAKE ONE CAPSULE BY MOUTH TWICE DAILY, Disp: 180 capsule, Rfl: 1 .  fluticasone (FLONASE) 50 MCG/ACT nasal spray, Place 2 sprays into both nostrils daily., Disp: 16 g, Rfl: 5 .  Glucosamine 500 MG CAPS, Take 1,200 mg by mouth 2 (two) times daily., Disp: , Rfl:  .  GuaiFENesin (MUCINEX PO), Take by mouth as needed., Disp: , Rfl:  .  IBUPROFEN PO, Take by mouth. Alternates with Tylenol, Disp: , Rfl:  .  PROAIR HFA 108 (90 Base) MCG/ACT inhaler, INHALE TWO PUFFS BY MOUTH EVERY 6 HOURS AS NEEDED FOR WHEEZING OR SHORTNESS OF BREATH, Disp: 1 Inhaler, Rfl: 0 .  Probiotic Product (PROBIOTIC ADVANCED PO), Take by mouth daily., Disp: , Rfl:  .  sodium chloride (OCEAN) 0.65 % SOLN nasal spray, Place 1 spray into both nostrils daily as needed for congestion., Disp: , Rfl:  .  vitamin E 400 UNIT capsule, Take 400 Units by mouth daily., Disp: , Rfl:  .  Cyanocobalamin (VITAMIN B-12 PO), Take by mouth., Disp: , Rfl:   Review of Systems     Objective:   Physical Exam  Constitutional: She is oriented to person, place, and time.  She appears well-developed  and well-nourished. No distress.  HENT:  Head: Normocephalic and atraumatic.  Right Ear: External ear normal.  Left Ear: External ear normal.  Mouth/Throat: Oropharynx is clear and moist. No oropharyngeal exudate.  Eyes: Pupils are equal, round, and reactive to light. Conjunctivae and EOM are normal. Right eye exhibits no discharge. Left eye exhibits no discharge. No scleral icterus.  Neck: Normal range of motion. Neck supple. No JVD present. No tracheal deviation present. No thyromegaly present.  Cardiovascular: Normal rate, regular rhythm, normal heart sounds and intact distal pulses.  Exam reveals no gallop and no friction rub.   No murmur heard. Pulmonary/Chest: Effort normal and breath sounds normal. No respiratory distress. She has no wheezes. She has no rales. She exhibits no tenderness.  Abdominal: Soft. Bowel sounds are normal. She exhibits no distension and no mass. There is no tenderness. There is no rebound and no guarding.  Musculoskeletal: Normal range of motion. She exhibits no edema or tenderness.  Lymphadenopathy:    She has no cervical adenopathy.  Neurological: She is alert and oriented to person, place, and time. She has normal reflexes. No cranial nerve deficit. She exhibits normal muscle tone. Coordination normal.  Skin: Skin is warm and dry. No rash noted. She is not diaphoretic. No erythema. No pallor.  Psychiatric: She has a normal mood and affect. Her behavior is normal. Judgment and thought content normal.  Vitals reviewed.  Vitals:   03/02/17 1155  BP: 140/82  Pulse: 76  SpO2: 90%  Weight: 132 lb 6.4 oz (60.1 kg)  Height: 5\' 2"  (1.575 m)    Estimated body mass index is 24.22 kg/m as calculated from the following:   Height as of this encounter: 5\' 2"  (1.575 m).   Weight as of this encounter: 132 lb 6.4 oz (60.1 kg).     Assessment:       ICD-10-CM   1. Moderate COPD (chronic obstructive pulmonary disease), Sees Dr. Chase Caller, Pulm J44.9         Plan:      Stable disease  Plan - change anoro + arnuity to TRELEGY once daily - if cheaper you could do TRELEGY  - take sample trelegy if you have it - use albuterol as needd; Daneil Dan to ensure refills - flu shot in fall - use o2 with exertion esp with travel to [place]  Followup 6 months or sooner if needed   Dr. Brand Males, M.D., [facility].C.P Pulmonary and Critical Care Medicine Staff Physician [place] [facility] Pager: [phone], If no answer or between  15:00h - 7:00h: call [phone]  03/02/2017 12:12 PM

## 2017-03-02 NOTE — Patient Instructions (Addendum)
ICD-10-CM   1. Moderate COPD (chronic obstructive pulmonary disease), Sees Dr. Chase Caller, Pulm J44.9    Stable disease  Plan - change anoro + arnuity to [facility] once daily - if cheaper you could do TRELEGY  - take sample trelegy if you have it - use albuterol as needd; Daneil Dan to ensure refills - flu shot in fall - use o2 with exertion esp with travel to [place]  Followup 6 months or sooner if needed

## 2017-04-14 ENCOUNTER — Other Ambulatory Visit: Payer: Self-pay | Admitting: Family Medicine

## 2017-04-27 ENCOUNTER — Ambulatory Visit (INDEPENDENT_AMBULATORY_CARE_PROVIDER_SITE_OTHER): Payer: Medicare Other | Admitting: *Deleted

## 2017-04-27 DIAGNOSIS — Z23 Encounter for immunization: Secondary | ICD-10-CM

## 2017-05-10 DIAGNOSIS — H02421 Myogenic ptosis of right eyelid: Secondary | ICD-10-CM | POA: Diagnosis not present

## 2017-05-11 DIAGNOSIS — H02421 Myogenic ptosis of right eyelid: Secondary | ICD-10-CM | POA: Diagnosis not present

## 2017-05-17 ENCOUNTER — Other Ambulatory Visit (HOSPITAL_BASED_OUTPATIENT_CLINIC_OR_DEPARTMENT_OTHER): Payer: Medicare Other

## 2017-05-17 ENCOUNTER — Ambulatory Visit (HOSPITAL_COMMUNITY)
Admission: RE | Admit: 2017-05-17 | Discharge: 2017-05-17 | Disposition: A | Discharge status: Home / Self Care | Payer: Medicare Other | Source: Ambulatory Visit | Attending: Internal Medicine | Admitting: Internal Medicine

## 2017-05-17 ENCOUNTER — Encounter (HOSPITAL_COMMUNITY): Payer: Self-pay

## 2017-05-17 DIAGNOSIS — I2510 Atherosclerotic heart disease of native coronary artery without angina pectoris: Secondary | ICD-10-CM | POA: Insufficient documentation

## 2017-05-17 DIAGNOSIS — I700 Atherosclerosis of aorta: Secondary | ICD-10-CM | POA: Insufficient documentation

## 2017-05-17 DIAGNOSIS — C3491 Malignant neoplasm of unspecified part of right bronchus or lung: Secondary | ICD-10-CM | POA: Diagnosis not present

## 2017-05-17 DIAGNOSIS — R918 Other nonspecific abnormal finding of lung field: Secondary | ICD-10-CM | POA: Diagnosis not present

## 2017-05-17 DIAGNOSIS — J432 Centrilobular emphysema: Secondary | ICD-10-CM | POA: Diagnosis not present

## 2017-05-17 DIAGNOSIS — Z85118 Personal history of other malignant neoplasm of bronchus and lung: Secondary | ICD-10-CM

## 2017-05-17 LAB — CBC WITH DIFFERENTIAL/PLATELET
BASO%: 1.2 % (ref 0.0–2.0)
Basophils Absolute: 0.1 10*3/uL (ref 0.0–0.1)
EOS%: 1.2 % (ref 0.0–7.0)
Eosinophils Absolute: 0.1 10*3/uL (ref 0.0–0.5)
HCT: 43 % (ref 34.8–46.6)
HGB: 14.7 g/dL (ref 11.6–15.9)
LYMPH%: 20.8 % (ref 14.0–49.7)
MCH: 32.6 pg (ref 25.1–34.0)
MCHC: 34.2 g/dL (ref 31.5–36.0)
MCV: 95.3 fL (ref 79.5–101.0)
MONO#: 0.5 10*3/uL (ref 0.1–0.9)
MONO%: 8.3 % (ref 0.0–14.0)
NEUT#: 3.9 10*3/uL (ref 1.5–6.5)
NEUT%: 68.5 % (ref 38.4–76.8)
Platelets: 183 10*3/uL (ref 145–400)
RBC: 4.51 10*6/uL (ref 3.70–5.45)
RDW: 12.4 % (ref 11.2–14.5)
WBC: 5.8 10*3/uL (ref 3.9–10.3)
lymph#: 1.2 10*3/uL (ref 0.9–3.3)
nRBC: 0 % (ref 0–0)

## 2017-05-17 LAB — COMPREHENSIVE METABOLIC PANEL
ALT: 13 U/L (ref 0–55)
AST: 16 U/L (ref 5–34)
Albumin: 4.4 g/dL (ref 3.5–5.0)
Alkaline Phosphatase: 74 U/L (ref 40–150)
Anion Gap: 9 mEq/L (ref 3–11)
BUN: 9.3 mg/dL (ref 7.0–26.0)
CO2: 24 mEq/L (ref 22–29)
Calcium: 9.4 mg/dL (ref 8.4–10.4)
Chloride: 103 mEq/L (ref 98–109)
Creatinine: 0.8 mg/dL (ref 0.6–1.1)
EGFR: 68 mL/min/{1.73_m2} — ABNORMAL LOW (ref >90)
Glucose: 98 mg/dl (ref 70–140)
Potassium: 4 mEq/L (ref 3.5–5.1)
Sodium: 136 mEq/L (ref 136–145)
Total Bilirubin: 0.81 mg/dL (ref 0.20–1.20)
Total Protein: 7.2 g/dL (ref 6.4–8.3)

## 2017-05-17 MED ORDER — IOPAMIDOL (ISOVUE-300) INJECTION 61%
INTRAVENOUS | Status: AC
  Filled 2017-05-17: qty 75

## 2017-05-17 MED ORDER — IOPAMIDOL (ISOVUE-300) INJECTION 61%
75.0000 mL | Freq: Once | INTRAVENOUS | Status: AC | PRN
  Administered 2017-05-17: 75 mL via INTRAVENOUS

## 2017-05-24 ENCOUNTER — Ambulatory Visit (HOSPITAL_BASED_OUTPATIENT_CLINIC_OR_DEPARTMENT_OTHER): Payer: Medicare Other | Admitting: Internal Medicine

## 2017-05-24 ENCOUNTER — Encounter: Payer: Self-pay | Admitting: Internal Medicine

## 2017-05-24 VITALS — BP 151/93 | HR 67 | Temp 97.9°F | Resp 18 | Ht 62.0 in | Wt 136.3 lb

## 2017-05-24 DIAGNOSIS — I10 Essential (primary) hypertension: Secondary | ICD-10-CM | POA: Diagnosis not present

## 2017-05-24 DIAGNOSIS — Z85118 Personal history of other malignant neoplasm of bronchus and lung: Secondary | ICD-10-CM

## 2017-05-24 DIAGNOSIS — C3491 Malignant neoplasm of unspecified part of right bronchus or lung: Secondary | ICD-10-CM

## 2017-05-24 NOTE — Progress Notes (Signed)
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   PRIOR THERAPY: Status post wedge resection of the right upper lobe lung nodule under the care of Dr. Roxan Hockey on 10/20/2011.   CURRENT THERAPY: Observation.  INTERVAL HISTORY: [name] 76 y.o. female returns to the clinic today for follow-up visit. The patient is feeling fine today with no specific complaints. She denied having any chest pain, shortness of breath, cough or hemoptysis. She denied having any fever or chills. She was recently diagnosed with essential tremor as well as positional vertigo. She is followed by Dr. Chase Caller for her pulmonary issues. The patient denied having any weight loss or night sweats. She is here today for evaluation with repeat CT scan of the chest for restaging of her disease.  MEDICAL HISTORY: Past Medical History:  Diagnosis Date  . Allergic rhinitis   . Anemia   . Arthritis    all over  . Bilateral cataracts   . Chicken pox   . Cholelithiasis 05/21/2014   On CT scan 2015   . COPD (chronic obstructive pulmonary disease) ([place])   . Depression    hx ocd and anxiety in record as well  . Gout    last time 6-33yrs ago   . History of blood transfusion   . Hot flashes    takes Prozac daily  . Hx of migraines 90's   after menopause HA stopped  . Hyperlipidemia    taking Flax Seed Oil and Fish Oil  . Hypertension    takes Amlodipine nightly  . IBS (irritable bowel syndrome)   . Liver cyst   . Lung cancer ([place])   . Macrocytosis   . Migraine    MIGRAINES RESOLVED WITH MENOPAUSE  . Osteopenia    takes Calcium and Vit D bid  . Pneumonia    x 2 ;last time back in 1999  . PONV (postoperative nausea and vomiting)   . S/P right THA, AA  - Dr. 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 Seasonal allergies   . Smoking 09/12/2011    ALLERGIES:  is allergic to clindamycin/lincomycin; penicillins; spiriva [tiotropium bromide monohydrate]; and sulfa antibiotics.  MEDICATIONS:  Current Outpatient Prescriptions  Medication Sig Dispense Refill  . albuterol (PROAIR HFA) 108 (90 Base) MCG/ACT inhaler INHALE TWO PUFFS BY MOUTH EVERY 6 HOURS AS NEEDED FOR WHEEZING OR SHORTNESS OF BREATH 1 Inhaler 5  . amLODipine (NORVASC) 5 MG tablet TAKE 1 TABLET BY MOUTH AT BEDTIME. NEED APPOINTMENT. 90 tablet 0  . Calcium-Magnesium-Zinc 500-250-12.5 MG TABS Take 1 tablet by mouth daily.    . Cholecalciferol (VITAMIN D-3 PO) Take 1,200 mg by mouth every morning.    . Cyanocobalamin (VITAMIN B-12 PO) Take by mouth.    Marland Kitchen FLUoxetine (PROZAC) 20 MG capsule TAKE 1 CAPSULE BY MOUTH TWICE DAILY. NEED AN APPOINTMENT. 180 capsule 0  . fluticasone (FLONASE) 50 MCG/ACT nasal spray Place 2 sprays into both nostrils daily. 16 g 5  . Fluticasone-Umeclidin-Vilant (TRELEGY ELLIPTA) 100-62.5-25 MCG/INH AEPB Inhale 1 puff into the lungs daily. 1 each 11  . Glucosamine 500 MG CAPS Take 1,200 mg by mouth 2 (two) times daily.    . GuaiFENesin (MUCINEX PO) Take by mouth as needed.    Marland Kitchen  sodium chloride (OCEAN) 0.65 % SOLN nasal spray Place 1 spray into both nostrils daily as needed for congestion.    . vitamin E 400 UNIT capsule Take 400 Units by mouth daily.    . Acetaminophen (TYLENOL PO) Take by mouth as needed. Alternates with Ibuprofen     No current facility-administered medications for this visit.     SURGICAL HISTORY:  Past Surgical History:  Procedure Laterality Date  . CARPAL TUNNEL RELEASE  80's   right   . COLONOSCOPY    . cyst removed  >69yrs ago   from left leg  . DILATION AND CURETTAGE OF UTERUS     at age 52  . EXCISIONAL TOTAL KNEE ARTHROPLASTY Right 06/03/2014   Procedure: RIGHT KNEE SCAR EXCISION;  Surgeon: [name], MD;   [facility];  Service: Orthopedics;  Laterality: Right;  . INGUINAL HERNIA REPAIR  2000  . neuroplasty decompression median nerve at carpal tunnel    . ROTATOR CUFF REPAIR  2001   right  . TONSILLECTOMY  as a child   and adenoids  . TOTAL ABDOMINAL HYSTERECTOMY  2003  . TOTAL HIP ARTHROPLASTY  2011   left  . TOTAL HIP ARTHROPLASTY  03/07/2012   Procedure: TOTAL HIP ARTHROPLASTY ANTERIOR APPROACH;  Surgeon: [name], MD;  [facility];  Service: Orthopedics;  Laterality: Right;  . TOTAL KNEE ARTHROPLASTY Right 01/02/2013   Procedure: RIGHT TOTAL KNEE ARTHROPLASTY;  Surgeon: [name], MD;  [facility];  Service: Orthopedics;  Laterality: Right;  . TOTAL KNEE ARTHROPLASTY Left 04/03/2013   Procedure: LEFT TOTAL KNEE ARTHROPLASTY;  Surgeon: [name], MD;  [facility];  Service: Orthopedics;  Laterality: Left;  Marland Kitchen VIDEO BRONCHOSCOPY  10/20/2011   Procedure: VIDEO BRONCHOSCOPY;  Surgeon: Grace Isaac, MD;  Location: [facility];  Service: Thoracic;  Laterality: N/A;  . WEDGE RESECTION  10-20-2011   rt lung  - for lung cancer    REVIEW OF SYSTEMS:  A comprehensive review of systems was negative.   PHYSICAL EXAMINATION: General appearance: alert, cooperative and no distress Head: Normocephalic, without obvious abnormality, atraumatic Neck: no adenopathy, no JVD, supple, symmetrical, trachea midline and thyroid not enlarged, symmetric, no tenderness/mass/nodules Lymph nodes: Cervical, supraclavicular, and axillary nodes normal. Resp: clear to auscultation bilaterally Back: symmetric, no curvature. ROM normal. No CVA tenderness. Cardio: regular rate and rhythm, S1, S2 normal, no murmur, click, rub or gallop GI: soft, non-tender; bowel sounds normal; no masses,  no organomegaly Extremities: extremities normal, atraumatic, no cyanosis or edema  ECOG PERFORMANCE STATUS: 1 - Symptomatic but completely ambulatory  Blood pressure (!) 151/93, pulse 67, temperature  97.9 F (36.6 C), temperature source Oral, resp. rate 18, height 5\' 2"  (1.575 m), weight 136 lb 4.8 oz (61.8 kg), SpO2 95 %.  LABORATORY DATA: Lab Results  Component Value Date   WBC 5.8 05/17/2017   HGB 14.7 05/17/2017   HCT 43.0 05/17/2017   MCV 95.3 05/17/2017   PLT 183 05/17/2017      Chemistry      Component Value Date/Time   NA 136 05/17/2017 0922   K 4.0 05/17/2017 0922   CL 101 09/14/2016 1035   CL 103 11/13/2012 0939   CO2 24 05/17/2017 0922   BUN 9.3 05/17/2017 0922   CREATININE 0.8 05/17/2017 0922      Component Value Date/Time   CALCIUM 9.4 05/17/2017 0922   ALKPHOS 74 05/17/2017 0922   AST 16 05/17/2017 6811  ALT 13 05/17/2017 0922   BILITOT 0.81 05/17/2017 6015       RADIOGRAPHIC STUDIES:  ASSESSMENT AND PLAN: This is a very pleasant 76 years old white female with history of stage IA non-small cell lung cancer, squamous cell carcinoma diagnosed in March of 2013 status post wedge resection of the right upper lobe and currently on observation. The patient is doing fine and she has no evidence for disease recurrence. She has been on observation for the last 5 years. The patient has no complaints today. Her recent CT scan of the chest showed no evidence for disease recurrence. I discussed the scan results with the patient and recommended for her to continue on observation with routine follow-up visit by her primary care physician at this point. I'll be happy to see her in the future if needed. She was advised to call immediately if she has any concerning symptoms in the interval. The patient voices understanding of current disease status and treatment options and is in agreement with the current care plan.  All questions were answered. The patient knows to call the clinic with any problems, questions or concerns. We can certainly see the patient much sooner if necessary.  Disclaimer: This note was dictated with voice recognition software. Similar sounding words  can inadvertently be transcribed and may not be corrected upon review.

## 2017-05-31 ENCOUNTER — Telehealth: Payer: Self-pay | Admitting: Internal Medicine

## 2017-05-31 NOTE — Telephone Encounter (Signed)
Spoke with pt, she states she needs something more at night and she wants to go back to the 2 medicines that she knows works best for her. Ok to switch back? MR please advise..   Stable disease  Plan - change anoro + arnuity to TRELEGY once daily - if cheaper you could do TRELEGY             - take sample trelegy if you have it - use albuterol as needd; Daneil Dan to ensure refills - flu shot in fall - use o2 with exertion esp with travel to [place]  Followup 6 months or sooner if needed

## 2017-05-31 NOTE — Telephone Encounter (Signed)
Ok to change to anoro and arnuity  Dr. Brand Males, M.D., [facility].C.P Pulmonary and Critical Care Medicine Staff Physician [place] [facility] Pager: [phone], If no answer or between  15:00h - 7:00h: call [phone]  05/31/2017 5:38 PM

## 2017-06-01 DIAGNOSIS — H52203 Unspecified astigmatism, bilateral: Secondary | ICD-10-CM | POA: Diagnosis not present

## 2017-06-01 DIAGNOSIS — H31002 Unspecified chorioretinal scars, left eye: Secondary | ICD-10-CM | POA: Diagnosis not present

## 2017-06-01 DIAGNOSIS — H02403 Unspecified ptosis of bilateral eyelids: Secondary | ICD-10-CM | POA: Diagnosis not present

## 2017-06-01 MED ORDER — UMECLIDINIUM-VILANTEROL 62.5-25 MCG/INH IN AEPB: 1.0000 | INHALATION_SPRAY | Freq: Every day | RESPIRATORY_TRACT | 5 refills | Status: DC

## 2017-06-01 MED ORDER — FLUTICASONE FUROATE 100 MCG/ACT IN AEPB: 1.0000 | INHALATION_SPRAY | Freq: Every day | RESPIRATORY_TRACT | 5 refills | Status: DC

## 2017-06-01 NOTE — Telephone Encounter (Signed)
Spoke with pt and advised rx's sent to pharmacy. Nothing further is needed.

## 2017-06-24 DIAGNOSIS — H02401 Unspecified ptosis of right eyelid: Secondary | ICD-10-CM | POA: Diagnosis not present

## 2017-06-24 DIAGNOSIS — H02421 Myogenic ptosis of right eyelid: Secondary | ICD-10-CM | POA: Diagnosis not present

## 2017-07-15 ENCOUNTER — Other Ambulatory Visit: Payer: Self-pay | Admitting: Family Medicine

## 2017-07-22 ENCOUNTER — Ambulatory Visit (INDEPENDENT_AMBULATORY_CARE_PROVIDER_SITE_OTHER): Payer: Medicare Other | Admitting: Family Medicine

## 2017-07-22 ENCOUNTER — Encounter: Payer: Self-pay | Admitting: Family Medicine

## 2017-07-22 VITALS — BP 100/80 | HR 87 | Temp 97.9°F | Ht 62.0 in | Wt 134.6 lb

## 2017-07-22 DIAGNOSIS — I10 Essential (primary) hypertension: Secondary | ICD-10-CM | POA: Diagnosis not present

## 2017-07-22 DIAGNOSIS — R21 Rash and other nonspecific skin eruption: Secondary | ICD-10-CM | POA: Diagnosis not present

## 2017-07-22 DIAGNOSIS — I700 Atherosclerosis of aorta: Secondary | ICD-10-CM | POA: Diagnosis not present

## 2017-07-22 DIAGNOSIS — F339 Major depressive disorder, recurrent, unspecified: Secondary | ICD-10-CM | POA: Diagnosis not present

## 2017-07-22 MED ORDER — TRIAMCINOLONE ACETONIDE 0.5 % EX OINT: 1.0000 "application " | TOPICAL_OINTMENT | Freq: Two times a day (BID) | CUTANEOUS | 0 refills | Status: DC

## 2017-07-22 MED ORDER — FLUOXETINE HCL 20 MG PO CAPS: 20.0000 mg | ORAL_CAPSULE | Freq: Two times a day (BID) | ORAL | 3 refills | Status: DC

## 2017-07-22 MED ORDER — AMLODIPINE BESYLATE 5 MG PO TABS: 5.0000 mg | ORAL_TABLET | Freq: Every day | ORAL | 3 refills | Status: DC

## 2017-07-22 NOTE — Patient Instructions (Addendum)
BEFORE YOU LEAVE: PHQ9 -follow up: AWV with susan and follow up with Dr. Maudie Mercury in February - same day  Do not take the Ginko as can interact with the prozac and can not be taken with aspirin or any antiinflammatory drugs such as ibuprofen, naproxen, etc.  For the rash: -apply the triamcinilone ointment mixed with Aquaphor twice daily for 1-2 weeks, then Aquaphor alone. -follow up with dermatologist if not resolved over the nex t3-4 weeks.

## 2017-07-22 NOTE — Progress Notes (Signed)
HPI:  [name] is a pleasant 76 year old with a past medical history significant for hypertension, aortic atherosclerosis, depression, anxiety, hot flashes, COPD, history of lung cancer and osteopenia here for an acute visit regarding skin lesion.  She has not been in for some time in terms of follow-up. She reports recurrent dry itchy rash on the right ankle.  Reports she saw a dermatologist in the past but they did not do anything.  This did clear up for a while, but has recurred recently.  Denies pain, bruising, rash elsewhere, unexplained fevers.  She needs refills on her blood pressure medicines as she was only given 30 days with her pharmacy sent request for refills.  She also needs a refill on her SSRI.  Reports she also started gingko to see if helps memory.Reports she is doing well.  No mood concerns at this time. Denies bleeding, depression, CP, swelling. Due for annual wellness visit in February.  Basic labs with oncology in October.  ROS: See pertinent positives and negatives per HPI.  Past Medical History:  Diagnosis Date  . Allergic rhinitis   . Anemia   . Arthritis    all over  . Bilateral cataracts   . Chicken pox   . Cholelithiasis 05/21/2014   On CT scan 2015   . COPD (chronic obstructive pulmonary disease) ([place])   . Depression    hx ocd and anxiety in record as well  . Gout    last time 6-74yrs ago   . History of blood transfusion   . Hot flashes    takes Prozac daily  . Hx of migraines 90's   after menopause HA stopped  . Hyperlipidemia    taking Flax Seed Oil and Fish Oil  . Hypertension    takes Amlodipine nightly  . IBS (irritable bowel syndrome)   . Liver cyst   . Lung cancer ([place])   . Macrocytosis   . Migraine    MIGRAINES RESOLVED WITH MENOPAUSE  . Osteopenia    takes Calcium and Vit D bid  . Pneumonia    x 2 ;last time back in 1999  . PONV (postoperative nausea and vomiting)   . 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 S/P right  TKA 01/02/2013  . Seasonal allergies   . Smoking 09/12/2011    Past Surgical History:  Procedure Laterality Date  . CARPAL TUNNEL RELEASE  80's   right   . COLONOSCOPY    . cyst removed  >55yrs ago   from left leg  . DILATION AND CURETTAGE OF UTERUS     at age 31  . EXCISIONAL TOTAL KNEE ARTHROPLASTY Right 06/03/2014   Procedure: RIGHT KNEE SCAR EXCISION;  Surgeon: [name], MD;  Location: [facility];  Service: Orthopedics;  Laterality: Right;  . INGUINAL HERNIA REPAIR  2000  . neuroplasty decompression median nerve at carpal tunnel    . ROTATOR CUFF REPAIR  2001   right  . TONSILLECTOMY  as a child   and adenoids  . TOTAL ABDOMINAL HYSTERECTOMY  2003  . TOTAL HIP ARTHROPLASTY  2011   left  . TOTAL HIP ARTHROPLASTY  03/07/2012   Procedure: TOTAL HIP ARTHROPLASTY ANTERIOR APPROACH;  Surgeon: [name], MD;  Location: [facility];  Service: Orthopedics;  Laterality: Right;  . TOTAL KNEE ARTHROPLASTY Right 01/02/2013   Procedure: RIGHT TOTAL KNEE ARTHROPLASTY;  Surgeon: [name], MD;  [facility]: [facility];  Service: Orthopedics;  Laterality: Right;  .  TOTAL KNEE ARTHROPLASTY Left 04/03/2013   Procedure: LEFT TOTAL KNEE ARTHROPLASTY;  Surgeon: [name], MD;  [facility]: [facility];  Service: Orthopedics;  Laterality: Left;  Marland Kitchen VIDEO BRONCHOSCOPY  10/20/2011   Procedure: VIDEO BRONCHOSCOPY;  Surgeon: Grace Isaac, MD;  Location: [facility];  Service: Thoracic;  Laterality: N/A;  . WEDGE RESECTION  10-20-2011   rt lung  - for lung cancer    Family History  Problem Relation Age of Onset  . Hypertension Brother   . Alcohol abuse Unknown   . Depression Unknown   . Hearing loss Unknown   . Heart disease Unknown   . Hypertension Unknown   . Anesthesia problems Neg Hx   . Hypotension Neg Hx   . Malignant hyperthermia Neg Hx   . Pseudochol deficiency Neg Hx     Social History   Socioeconomic History  . Marital status: Married    Spouse name: None  . Number of children: None  .  Years of education: None  . Highest education level: None  Social Needs  . Financial resource strain: None  . Food insecurity - worry: None  . Food insecurity - inability: None  . Transportation needs - medical: None  . Transportation needs - non-medical: None  Occupational History  . Occupation: retired  Tobacco Use  . Smoking status: Former Smoker    Packs/day: 0.50    Years: 50.00    Pack years: 25.00    Types: Cigarettes    Last attempt to quit: 09/23/2011    Years since quitting: 5.8  . Smokeless tobacco: Never Used  Substance and Sexual Activity  . Alcohol use: Yes    Alcohol/week: 0.0 oz    Comment: 2 beers every night  . Drug use: No  . Sexual activity: No  Other Topics Concern  . None  Social History Narrative  . None     Current Outpatient Medications:  .  Acetaminophen (TYLENOL PO), Take by mouth as needed. Alternates with Ibuprofen, Disp: , Rfl:  .  albuterol (PROAIR HFA) 108 (90 Base) MCG/ACT inhaler, INHALE TWO PUFFS BY MOUTH EVERY 6 HOURS AS NEEDED FOR WHEEZING OR SHORTNESS OF BREATH, Disp: 1 Inhaler, Rfl: 5 .  amLODipine (NORVASC) 5 MG tablet, TAKE 1 TABLET BY MOUTH AT BEDTIME, Disp: 30 tablet, Rfl: 0 .  Calcium-Magnesium-Zinc 500-250-12.5 MG TABS, Take 1 tablet by mouth daily., Disp: , Rfl:  .  Cholecalciferol (VITAMIN D-3 PO), Take 1,200 mg by mouth every morning., Disp: , Rfl:  .  Cyanocobalamin (VITAMIN B-12 PO), Take by mouth., Disp: , Rfl:  .  FLUoxetine (PROZAC) 20 MG capsule, TAKE 1 CAPSULE BY MOUTH TWICE DAILY, Disp: 60 capsule, Rfl: 0 .  fluticasone (FLONASE) 50 MCG/ACT nasal spray, Place 2 sprays into both nostrils daily., Disp: 16 g, Rfl: 5 .  Fluticasone Furoate (ARNUITY ELLIPTA) 100 MCG/ACT AEPB, Inhale 1 puff into the lungs daily., Disp: 1 each, Rfl: 5 .  Glucosamine 500 MG CAPS, Take 1,200 mg by mouth 2 (two) times daily., Disp: , Rfl:  .  GuaiFENesin (MUCINEX PO), Take by mouth as needed., Disp: , Rfl:  .  OVER THE COUNTER MEDICATION, Ginko  biloba, Disp: , Rfl:  .  sodium chloride (OCEAN) 0.65 % SOLN nasal spray, Place 1 spray into both nostrils daily as needed for congestion., Disp: , Rfl:  .  umeclidinium-vilanterol (ANORO ELLIPTA) 62.5-25 MCG/INH AEPB, Inhale 1 puff into the lungs daily., Disp: 1 each, Rfl: 5 .  vitamin E 400  UNIT capsule, Take 400 Units by mouth daily., Disp: , Rfl:  .  triamcinolone ointment (KENALOG) 0.5 %, Apply 1 application topically 2 (two) times daily., Disp: 30 g, Rfl: 0  EXAM:  Vitals:   07/22/17 1425  BP: 100/80  Pulse: 87  Temp: 97.9 F (36.6 C)    Body mass index is 24.62 kg/m.  GENERAL: vitals reviewed and listed above, alert, oriented, appears well hydrated and in no acute distress  HEENT: atraumatic, conjunttiva clear, no obvious abnormalities on inspection of external nose and ears  NECK: no obvious masses on inspection  LUNGS: clear to auscultation bilaterally, no wheezes, rales or rhonchi, good air movement  CV: HRRR, no peripheral edema  MS: moves all extremities without noticeable abnormality  SKIN: patches of erythematous dry skin irritation on the R ankle  PSYCH: pleasant and cooperative, no obvious depression or anxiety  ASSESSMENT AND PLAN:  Discussed the following assessment and plan:  Skin rash -we discussed possible serious and likely etiologies, workup and treatment, treatment risks and return precautions -after this discussion, Avarose opted for topical steroid, emollient and dermatology follow up if persists -of course, we advised Kadey  to return or notify a doctor immediately if symptoms worsen  new concerns arise.  Essential hypertension -medication refills  Atherosclerosis of aorta (HCC) -lifestyle recs  Depression, recurrent: -well controlled -refills sent -advise of interaction with ginko and advised not to take the ginko  -Patient advised to return or notify a doctor immediately if symptoms worsen or persist or new concerns arise.  Patient  Instructions  BEFORE YOU LEAVE: PHQ9 -follow up: AWV with susan and follow up with Dr. Maudie Mercury in February - same day  Do not take the Ginko as can interact with the prozac and can not be taken with aspirin or any antiinflammatory drugs such as ibuprofen, naproxen, etc.  For the rash: -apply the triamcinilone ointment mixed with Aquaphor twice daily for 1-2 weeks, then Aquaphor alone. -follow up with dermatologist if not resolved over the nex t3-4 weeks.   Colin Benton R., DO

## 2017-08-18 ENCOUNTER — Encounter: Payer: Self-pay | Admitting: Family Medicine

## 2017-08-29 ENCOUNTER — Other Ambulatory Visit: Payer: Self-pay | Admitting: Family Medicine

## 2017-08-29 DIAGNOSIS — Z1231 Encounter for screening mammogram for malignant neoplasm of breast: Secondary | ICD-10-CM

## 2017-08-31 ENCOUNTER — Ambulatory Visit: Payer: Medicare Other | Admitting: Internal Medicine

## 2017-08-31 ENCOUNTER — Encounter: Payer: Self-pay | Admitting: Internal Medicine

## 2017-08-31 VITALS — BP 118/60 | HR 65 | Ht 62.0 in | Wt 135.0 lb

## 2017-08-31 DIAGNOSIS — J449 Chronic obstructive pulmonary disease, unspecified: Secondary | ICD-10-CM

## 2017-08-31 NOTE — Progress Notes (Signed)
Subjective:     Patient ID: [name], female   DOB: [birth year], 77 y.o.   MRN: [id number]  HPI    #COPD-MM genotype - 2013: Pre RUL lobectomy and Pre Rx: fev1 1.1L/60% , DLCO 9.6/56%, TLC 5.6/128%  - 2013 May: Post lobectomy and post Rx: fev1: 1.28L/60%, ratio 49 . No desat walk test - Rx spiriva, symbicort since spring 2013 - 2014 Nov: 06/22/2013. FEV1 is 1.3 L/62%. No post bronchodilator change. FVC is 2.6 L postop day later which is 95%. Ratio is 50. Total lung capacity is 4.96L/104%. DLCO is level 0.7/54%. - On synmbicort alone 04/05/2014 - Walk test today 185 feet x 3 laps on RA: Desat to < 88% at 2 laps - Change to [facility] plus Spiriva Respimat April 2016  # Stage 1 lung cancer: pT1b, pN0 Stage 08 October 2011 - s/p lobectomy by Dr Servando Snare - surveillance by Dr Earlie Server - 05/15/13 - Clear CT. No recurrence - October 2015 - clear CT,No recurrence  -Oct 2016 >no recurrence   09/11/2015 Follow up : COPD  Pt returns for 4 weeks follow up for COPD  PFT done today were reviewed with pt and showed  FEV 1 56%, ratio 49 , FVC 87%,  DLCO 43%  This is down slightly from 2014 (FEV1 62%)  CT chest in Oct 2016 did show mod emphysema .  She remains on Spiriva and BREO .  She has tried Symbicort and Dulera in past.  She says she does get winded with activity -but tried to stay active.  Has minimal cough . Sometimes has some wheezing and sinus drainge.  Denies chest pain, orthopnea, edema or hemoptysis .   Most recent CT chest Oct 2016 survelliance showed no cancer recurrence . Denies any chest pain, orthopnea, PND, leg swelling or hemoptysis.  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 She has questions regarding her prognosis .  Worried she will not be able to help him. "I need to out live him and be able to care for him"  She remains active with Swimming at [employer] and is part of the :"[employer]. "    OV 01/12/2016  Chief Complaint   Patient presents with  . Follow-up    Pt c/o some dry cough and SOB with exertion. Pt denies wheeze/CP/tightness.    Moderate COPD lung cancer patient  Pulmonary function Feb 2017 showed Gold stage II COPD. CT scan of the chest October 2016 did not show any lung cancer recurrence. She is on triple inhaler therapy. She wants to switch to another version of triple inhaler therapy namely ANORO with and ICS. This is because of friend has had good experience with it. She continues her shortness of breath with exertion but is reluctant to try oxygen. We discussed nocturnal oxygen. She says she will consider the indigent system on her own. COPD cat score is 19. In the interim she has developed mild intention tremor with inhalers are not bothering her as much.   OV 06/08/2016  Chief Complaint  Patient presents with  . Follow-up    Pt state breathing is pretty good. Pt states congestion from allergiesThe [employer] work). Pt c/o no tightness in chest, SOB w/ excertion, Pt states some coughing because of dry air. loves the Anora but can not afford (doughnut hole)     She had CT scan of the chest 05/21/2016 and shows cancer in remission. No new infiltrates. She also had blood work 05/21/2016 with a normal creatinine of 0.8  mg percent and hemoglobin of 14.9 g percent. Overall COPD stable. She does not have any problems she is on triple inhaler therapy which works well for her. She had baseline has exertional desaturations but is not using oxygen because of reluctance. She is planning to travel to [place] in [place] in 6 months and is inquiring about portable oxygen systems for her travel.   Walking desaturation test 185 feet 3 laps on room air 06/08/2016:Desaturated to 87% at the end of second lap   OV 03/02/2017  Chief Complaint  Patient presents with  . Follow-up    Pt states her breathing is unchanged since last OV. Pt c/o dry cough. Pt denies CP/tightness and f/c/s/.     Follow-up moderate COPD. Since  last visit in October 2017 things are stable. She is on triple inhaler therapy. She canceled her trip to [place]. He insists that she and her husband are going to [place] in [place] in September 2018. She has a follow-up scan for her lung cancer coming up in October 2018. This is from Dr. Julien Nordmann. She uses oxygen with exertion. She is started on the new shingles vaccine. Booster shot is due.  OV 08/31/2017  Chief Complaint  Patient presents with  . Follow-up    has occassional SOB,has non productive cough,uses inhaler anuity,anoro inhalers     Last visit July 2018.  There is a follow-up for moderate COPD.  She continues on triple inhaler therapy.  Since then her COPD CAT score is improved to 13.  Overall she is feeling fine without any exacerbations.  She has had to cancel her trip to [place] and [place] because of her husband's memory issues and back issues.  He is having back surgery in 2 weeks.  She plans to go to [place] and [place] and mid 2019.  She is up-to-date with her flu shot.  There are no main issues.  Earlier this year she had CT scan of the chest for lung cancer surveillance and there is no recurrence.  She has coronary artery calcification but she says she had a stress test in 2013 that was normal.  There is no active chest pain with exertion.  CAT COPD Symptom & Quality of Life Score (GSK trademark) 0 is no burden. 5 is highest burden 01/12/2016  08/31/2017   Never Cough -> Cough all the time 2 2  No phlegm in chest -> Chest is full of phlegm 2 1  No chest tightness -> Chest feels very tight 1 0  No dyspnea for 1 flight stairs/hill -> Very dyspneic for 1 flight of stairs 4 3  No limitations for ADL at home -> Very limited with ADL at home 2 3  Confident leaving home -> Not at all confident leaving home 1 0  Sleep soundly -> Do not sleep soundly because of lung condition 3 2  Lots of Energy -> No energy at all 4 2  TOTAL Score (max 40)  19 13     has a past medical history of  Allergic rhinitis, Anemia, Arthritis, Bilateral cataracts, Chicken pox, Cholelithiasis (05/21/2014), COPD (chronic obstructive pulmonary disease) ([place]), Depression, Gout, History of blood transfusion, Hot flashes, migraines (90's), Hyperlipidemia, Hypertension, IBS (irritable bowel syndrome), Liver cyst, Lung cancer ([place]), Macrocytosis, Migraine, Osteopenia, Pneumonia, PONV (postoperative nausea and vomiting), S/P right THA, AA - Dr. 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   reports that she quit smoking about 5 years ago. Her smoking use included cigarettes.  She has a 25.00 pack-year smoking history. she has never used smokeless tobacco.  Past Surgical History:  Procedure Laterality Date  . CARPAL TUNNEL RELEASE  80's   right   . COLONOSCOPY    . cyst removed  >66yrs ago   from left leg  . DILATION AND CURETTAGE OF UTERUS     at age 89  . EXCISIONAL TOTAL KNEE ARTHROPLASTY Right 06/03/2014   Procedure: RIGHT KNEE SCAR EXCISION;  Surgeon: [name], MD;  [facility]: [facility];  Service: Orthopedics;  Laterality: Right;  . INGUINAL HERNIA REPAIR  2000  . neuroplasty decompression median nerve at carpal tunnel    . ROTATOR CUFF REPAIR  2001   right  . TONSILLECTOMY  as a child   and adenoids  . TOTAL ABDOMINAL HYSTERECTOMY  2003  . TOTAL HIP ARTHROPLASTY  2011   left  . TOTAL HIP ARTHROPLASTY  03/07/2012   Procedure: TOTAL HIP ARTHROPLASTY ANTERIOR APPROACH;  Surgeon: [name], MD;  [facility]: [facility];  Service: Orthopedics;  Laterality: Right;  . TOTAL KNEE ARTHROPLASTY Right 01/02/2013   Procedure: RIGHT TOTAL KNEE ARTHROPLASTY;  Surgeon: [name], MD;  [facility];  Service: Orthopedics;  Laterality: Right;  . TOTAL KNEE ARTHROPLASTY Left 04/03/2013   Procedure: LEFT TOTAL KNEE ARTHROPLASTY;  Surgeon: [name], MD;  [facility];  Service: Orthopedics;  Laterality: Left;  Marland Kitchen VIDEO BRONCHOSCOPY  10/20/2011    Procedure: VIDEO BRONCHOSCOPY;  Surgeon: Grace Isaac, MD;  Location: [facility];  Service: Thoracic;  Laterality: N/A;  . WEDGE RESECTION  10-20-2011   rt lung  - for lung cancer    Allergies  Allergen Reactions  . Clindamycin/Lincomycin Nausea And Vomiting  . Penicillins Rash       . Spiriva [Tiotropium Bromide Monohydrate]     General intolerance  . Sulfa Antibiotics Hives and Rash    Severe Hives with skin reaction    Immunization History  Administered Date(s) Administered  . Influenza Split 06/02/2011, 05/09/2012  . Influenza, High Dose Seasonal PF 04/19/2016, 04/27/2017  . Influenza,inj,Quad PF,6+ Mos 05/18/2013, 04/17/2014, 05/21/2015  . Pneumococcal Conjugate-13 04/05/2014  . Pneumococcal Polysaccharide-23 08/09/1998, 09/21/2011  . Tdap 06/22/2012  . Zoster 08/10/2007    Family History  Problem Relation Age of Onset  . Hypertension Brother   . Alcohol abuse Unknown   . Depression Unknown   . Hearing loss Unknown   . Heart disease Unknown   . Hypertension Unknown   . Anesthesia problems Neg Hx   . Hypotension Neg Hx   . Malignant hyperthermia Neg Hx   . Pseudochol deficiency Neg Hx      Current Outpatient Medications:  .  Acetaminophen (TYLENOL PO), Take by mouth as needed. Alternates with Ibuprofen, Disp: , Rfl:  .  albuterol (PROAIR HFA) 108 (90 Base) MCG/ACT inhaler, INHALE TWO PUFFS BY MOUTH EVERY 6 HOURS AS NEEDED FOR WHEEZING OR SHORTNESS OF BREATH, Disp: 1 Inhaler, Rfl: 5 .  amLODipine (NORVASC) 5 MG tablet, Take 1 tablet (5 mg total) by mouth at bedtime., Disp: 90 tablet, Rfl: 3 .  Calcium-Magnesium-Zinc 500-250-12.5 MG TABS, Take 1 tablet by mouth daily., Disp: , Rfl:  .  Cholecalciferol (VITAMIN D-3 PO), Take 1,200 mg by mouth every morning., Disp: , Rfl:  .  Cyanocobalamin (VITAMIN B-12 PO), Take by mouth., Disp: , Rfl:  .  FLUoxetine (PROZAC) 20 MG capsule, Take 1 capsule (20 mg total) by mouth 2 (two) times daily., Disp: 180  capsule, Rfl: 3 .   fluticasone (FLONASE) 50 MCG/ACT nasal spray, Place 2 sprays into both nostrils daily., Disp: 16 g, Rfl: 5 .  Fluticasone Furoate (ARNUITY ELLIPTA) 100 MCG/ACT AEPB, Inhale 1 puff into the lungs daily., Disp: 1 each, Rfl: 5 .  Glucosamine 500 MG CAPS, Take 1,200 mg by mouth 2 (two) times daily., Disp: , Rfl:  .  GuaiFENesin (MUCINEX PO), Take by mouth as needed., Disp: , Rfl:  .  OVER THE COUNTER MEDICATION, Ginko biloba, Disp: , Rfl:  .  sodium chloride (OCEAN) 0.65 % SOLN nasal spray, Place 1 spray into both nostrils daily as needed for congestion., Disp: , Rfl:  .  triamcinolone ointment (KENALOG) 0.5 %, Apply 1 application topically 2 (two) times daily., Disp: 30 g, Rfl: 0 .  umeclidinium-vilanterol (ANORO ELLIPTA) 62.5-25 MCG/INH AEPB, Inhale 1 puff into the lungs daily., Disp: 1 each, Rfl: 5 .  vitamin E 400 UNIT capsule, Take 400 Units by mouth daily., Disp: , Rfl:    Review of Systems     Objective:   Physical Exam  Constitutional: She is oriented to person, place, and time. She appears well-developed and well-nourished. No distress.  HENT:  Head: Normocephalic and atraumatic.  Right Ear: External ear normal.  Left Ear: External ear normal.  Mouth/Throat: Oropharynx is clear and moist. No oropharyngeal exudate.  Eyes: Conjunctivae and EOM are normal. Pupils are equal, round, and reactive to light. Right eye exhibits no discharge. Left eye exhibits no discharge. No scleral icterus.  Neck: Normal range of motion. Neck supple. No JVD present. No tracheal deviation present. No thyromegaly present.  Cardiovascular: Normal rate, regular rhythm, normal heart sounds and intact distal pulses. Exam reveals no gallop and no friction rub.  No murmur heard. Pulmonary/Chest: Effort normal and breath sounds normal. No respiratory distress. She has no wheezes. She has no rales. She exhibits no tenderness.  Abdominal: Soft. Bowel sounds are normal. She exhibits no distension and no mass. There is  no tenderness. There is no rebound and no guarding.  Musculoskeletal: Normal range of motion. She exhibits no edema or tenderness.  Lymphadenopathy:    She has no cervical adenopathy.  Neurological: She is alert and oriented to person, place, and time. She has normal reflexes. No cranial nerve deficit. She exhibits normal muscle tone. Coordination normal.  Skin: Skin is warm and dry. No rash noted. She is not diaphoretic. No erythema. No pallor.  Psychiatric: She has a normal mood and affect. Her behavior is normal. Judgment and thought content normal.  Vitals reviewed.  Vitals:   08/31/17 1014  BP: 118/60  Pulse: 65  SpO2: 94%  Weight: 135 lb (61.2 kg)  Height: 5\' 2"  (1.575 m)    Estimated body mass index is 24.69 kg/m as calculated from the following:   Height as of this encounter: 5\' 2"  (1.575 m).   Weight as of this encounter: 135 lb (61.2 kg).     Assessment:       ICD-10-CM   1. Moderate COPD (chronic obstructive pulmonary disease), Sees Dr. Chase Caller, Pulm J44.9        Plan:      Stable COPD.  Glad you are doing better.  Plan -Continue your scheduled inhaler therapy as before -Continue with physical activity  Follow-up -9 months or sooner if needed (Best wishes for Mr. Melman's back surgery]

## 2017-08-31 NOTE — Patient Instructions (Signed)
ICD-10-CM   1. Moderate COPD (chronic obstructive pulmonary disease), Sees Dr. Chase Caller, Pulm J44.9     Stable COPD.  Glad you are doing better.  Plan -Continue your scheduled inhaler therapy as before -Continue with physical activity  Follow-up -9 months or sooner if needed (Best wishes for Mr. Hedges's back surgery]

## 2017-09-21 ENCOUNTER — Ambulatory Visit
Admission: RE | Admit: 2017-09-21 | Discharge: 2017-09-21 | Disposition: A | Discharge status: Home / Self Care | Payer: Medicare Other | Source: Ambulatory Visit | Attending: Family Medicine | Admitting: Family Medicine

## 2017-09-21 DIAGNOSIS — Z1231 Encounter for screening mammogram for malignant neoplasm of breast: Secondary | ICD-10-CM

## 2017-10-03 NOTE — Progress Notes (Signed)
HPI:  [name] is a pleasant 77 y.o. here for follow up. Chronic medical problems summarized below were reviewed for changes and stability and were updated as needed below. These issues and their treatment remain stable for the most part.  She has not been getting regular exercise because of the rainy weather.  She does report she eats a fairly healthy diet.  Denies CP, SOB, DOE, treatment intolerance or new symptoms. AWV with susan today. Reports her mood has been great.  Feels the Prozac has been wonderful for her. Due for labs -wants to check cholesterol, but is not fasting.  She reports that she has been treating her cholesterol by eating more garlic.  HTN, Aortic atherosclerosis: -meds: norvasc -Prefers not to take cholesterol medications for  Depression and anxiety: -meds: SSRI - prozac  COPD, hx lung Ca: -sees pulmonology and onc for management  Osteopenia -on vit D  ROS: See pertinent positives and negatives per HPI.  Past Medical History:  Diagnosis Date  . Allergic rhinitis   . Anemia   . Arthritis    all over  . Bilateral cataracts   . Chicken pox   . Cholelithiasis 05/21/2014   On CT scan 2015   . COPD (chronic obstructive pulmonary disease) ([place])   . Depression    hx ocd and anxiety in record as well  . Gout    last time 6-60yrs ago   . History of blood transfusion   . Hot flashes    takes Prozac daily  . Hx of migraines 90's   after menopause HA stopped  . Hyperlipidemia    taking Flax Seed Oil and Fish Oil  . Hypertension    takes Amlodipine nightly  . IBS (irritable bowel syndrome)   . Liver cyst   . Lung cancer ([place])   . Macrocytosis   . Migraine    MIGRAINES RESOLVED WITH MENOPAUSE  . Osteopenia    takes Calcium and Vit D bid  . Pneumonia    x 2 ;last time back in 1999  . PONV (postoperative nausea and vomiting)   . 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 Seasonal allergies   . Smoking 09/12/2011     Past Surgical History:  Procedure Laterality Date  . CARPAL TUNNEL RELEASE  80's   right   . COLONOSCOPY    . cyst removed  >75yrs ago   from left leg  . DILATION AND CURETTAGE OF UTERUS     at age 29  . EXCISIONAL TOTAL KNEE ARTHROPLASTY Right 06/03/2014   Procedure: RIGHT KNEE SCAR EXCISION;  Surgeon: [name], MD;  [facility]: [facility];  Service: Orthopedics;  Laterality: Right;  . INGUINAL HERNIA REPAIR  2000  . neuroplasty decompression median nerve at carpal tunnel    . ROTATOR CUFF REPAIR  2001   right  . TONSILLECTOMY  as a child   and adenoids  . TOTAL ABDOMINAL HYSTERECTOMY  2003  . TOTAL HIP ARTHROPLASTY  2011   left  . TOTAL HIP ARTHROPLASTY  03/07/2012   Procedure: TOTAL HIP ARTHROPLASTY ANTERIOR APPROACH;  Surgeon: [name], MD;  [facility];  Service: Orthopedics;  Laterality: Right;  . TOTAL KNEE ARTHROPLASTY Right 01/02/2013   Procedure: RIGHT TOTAL KNEE ARTHROPLASTY;  Surgeon: [name], MD;  [facility];  Service: Orthopedics;  Laterality: Right;  . TOTAL KNEE ARTHROPLASTY Left 04/03/2013   Procedure: LEFT TOTAL KNEE  ARTHROPLASTY;  Surgeon: [name], MD;  [facility];  Service: Orthopedics;  Laterality: Left;  Marland Kitchen VIDEO BRONCHOSCOPY  10/20/2011   Procedure: VIDEO BRONCHOSCOPY;  Surgeon: Grace Isaac, MD;  Location: [facility];  Service: Thoracic;  Laterality: N/A;  . WEDGE RESECTION  10-20-2011   rt lung  - for lung cancer    Family History  Problem Relation Age of Onset  . Hypertension Brother   . Alcohol abuse Unknown   . Depression Unknown   . Hearing loss Unknown   . Heart disease Unknown   . Hypertension Unknown   . Anesthesia problems Neg Hx   . Hypotension Neg Hx   . Malignant hyperthermia Neg Hx   . Pseudochol deficiency Neg Hx     Social History   Socioeconomic History  . Marital status: Married    Spouse name: None  . Number of children: None  . Years of education: None  . Highest education level: None   Social Needs  . Financial resource strain: None  . Food insecurity - worry: None  . Food insecurity - inability: None  . Transportation needs - medical: None  . Transportation needs - non-medical: None  Occupational History  . Occupation: retired  Tobacco Use  . Smoking status: Former Smoker    Packs/day: 0.50    Years: 50.00    Pack years: 25.00    Types: Cigarettes    Last attempt to quit: 09/23/2011    Years since quitting: 6.0  . Smokeless tobacco: Never Used  Substance and Sexual Activity  . Alcohol use: Yes    Alcohol/week: 0.0 oz    Comment: 2 beers every night  . Drug use: No  . Sexual activity: No  Other Topics Concern  . None  Social History Narrative  . None     Current Outpatient Medications:  .  Acetaminophen (TYLENOL PO), Take by mouth as needed. Alternates with Ibuprofen, Disp: , Rfl:  .  albuterol (PROAIR HFA) 108 (90 Base) MCG/ACT inhaler, INHALE TWO PUFFS BY MOUTH EVERY 6 HOURS AS NEEDED FOR WHEEZING OR SHORTNESS OF BREATH, Disp: 1 Inhaler, Rfl: 5 .  amLODipine (NORVASC) 5 MG tablet, Take 1 tablet (5 mg total) by mouth at bedtime., Disp: 90 tablet, Rfl: 3 .  Calcium-Magnesium-Zinc 500-250-12.5 MG TABS, Take 1 tablet by mouth daily., Disp: , Rfl:  .  Cholecalciferol (VITAMIN D-3 PO), Take 1,200 mg by mouth every morning., Disp: , Rfl:  .  Cyanocobalamin (VITAMIN B-12 PO), Take by mouth., Disp: , Rfl:  .  FLUoxetine (PROZAC) 20 MG capsule, Take 1 capsule (20 mg total) by mouth 2 (two) times daily., Disp: 180 capsule, Rfl: 3 .  fluticasone (FLONASE) 50 MCG/ACT nasal spray, Place 2 sprays into both nostrils daily., Disp: 16 g, Rfl: 5 .  Fluticasone Furoate (ARNUITY ELLIPTA) 100 MCG/ACT AEPB, Inhale 1 puff into the lungs daily., Disp: 1 each, Rfl: 5 .  Glucosamine 500 MG CAPS, Take 1,200 mg by mouth 2 (two) times daily., Disp: , Rfl:  .  GuaiFENesin (MUCINEX PO), Take by mouth as needed., Disp: , Rfl:  .  sodium chloride (OCEAN) 0.65 % SOLN nasal spray, Place 1  spray into both nostrils daily as needed for congestion., Disp: , Rfl:  .  triamcinolone ointment (KENALOG) 0.5 %, Apply 1 application topically 2 (two) times daily., Disp: 30 g, Rfl: 0 .  umeclidinium-vilanterol (ANORO ELLIPTA) 62.5-25 MCG/INH AEPB, Inhale 1 puff into the lungs daily., Disp: 1 each, Rfl: 5 .  vitamin E 400 UNIT capsule, Take 400 Units by mouth daily., Disp: , Rfl:   EXAM:  Vitals:   10/04/17 1037  BP: 98/70  Pulse: 88  Temp: 97.7 F (36.5 C)    Body mass index is 24.76 kg/m.  GENERAL: vitals reviewed and listed above, alert, oriented, appears well hydrated and in no acute distress  HEENT: atraumatic, conjunttiva clear, no obvious abnormalities on inspection of external nose and ears  NECK: no obvious masses on inspection  LUNGS: clear to auscultation bilaterally, no wheezes, rales or rhonchi, good air movement  CV: HRRR, no peripheral edema  MS: moves all extremities without noticeable abnormality  PSYCH: pleasant and cooperative, no obvious depression or anxiety  ASSESSMENT AND PLAN:  Discussed the following assessment and plan:  Atherosclerosis of aorta (HCC) - Plan: HDL cholesterol, Cholesterol, total  Essential hypertension - Plan: Basic metabolic panel, CBC with Differential/Platelet  Anxiety  Recurrent major depressive disorder, in full remission (HCC)  Moderate COPD (chronic obstructive pulmonary disease), Sees Dr. Chase Caller, Pulm  -Labs per orders -Discussed creative ways to get exercise even when the weather is poor -Advised to continue a healthy diet -Labs per orders -Patient advised to return or notify a doctor immediately if symptoms worsen or persist or new concerns arise.  Patient Instructions  BEFORE YOU LEAVE: -labs -follow up: 3-4 months  We have ordered labs or studies at this visit. It can take up to 1-2 weeks for results and processing. IF results require follow up or explanation, we will call you with instructions.  Clinically stable results will be released to your [facility]. If you have not heard from [place] or cannot find your results in [facility] in 2 weeks please contact our office at [phone].  If you are not yet signed up for [facility], please consider signing up.   We recommend the following healthy lifestyle for LIFE: 1) Small portions. But, make sure to get regular (at least 3 per day), healthy meals and small healthy snacks if needed.  2) Eat a healthy clean diet.   TRY TO EAT: -at least 5-7 servings of low sugar, colorful, and nutrient rich vegetables per day (not corn, potatoes or bananas.) -berries are the best choice if you wish to eat fruit (only eat small amounts if trying to reduce weight)  -lean meets (fish, white meat of chicken or [place]) -vegan proteins for some meals - beans or tofu, whole grains, nuts and seeds -Replace bad fats with good fats - good fats include: fish, nuts and seeds, canola oil, olive oil -small amounts of low fat or non fat dairy -small amounts of100 % whole grains - check the lables -drink plenty of water  AVOID: -SUGAR, sweets, anything with added sugar, corn syrup or sweeteners - must read labels as even foods advertised as "healthy" often are loaded with sugar -if you must have a sweetener, small amounts of stevia may be best -sweetened beverages and artificially sweetened beverages -simple starches (rice, bread, potatoes, pasta, chips, etc - small amounts of 100% whole grains are ok) -red meat, pork, butter -fried foods, fast food, processed food, excessive dairy, eggs and coconut.  3)Get at least 150 minutes of sweaty aerobic exercise per week.  4)Reduce stress - consider counseling, meditation and relaxation to balance other aspects of your life.          [name], DO

## 2017-10-04 ENCOUNTER — Ambulatory Visit (INDEPENDENT_AMBULATORY_CARE_PROVIDER_SITE_OTHER): Payer: Medicare Other | Admitting: Family Medicine

## 2017-10-04 ENCOUNTER — Encounter: Payer: Self-pay | Admitting: Family Medicine

## 2017-10-04 ENCOUNTER — Ambulatory Visit (INDEPENDENT_AMBULATORY_CARE_PROVIDER_SITE_OTHER): Payer: Medicare Other

## 2017-10-04 VITALS — BP 98/70 | HR 88 | Ht 62.0 in | Wt 135.0 lb

## 2017-10-04 VITALS — BP 98/70 | HR 88 | Temp 97.7°F | Ht 62.0 in | Wt 135.4 lb

## 2017-10-04 DIAGNOSIS — I700 Atherosclerosis of aorta: Secondary | ICD-10-CM

## 2017-10-04 DIAGNOSIS — J449 Chronic obstructive pulmonary disease, unspecified: Secondary | ICD-10-CM

## 2017-10-04 DIAGNOSIS — I10 Essential (primary) hypertension: Secondary | ICD-10-CM | POA: Diagnosis not present

## 2017-10-04 DIAGNOSIS — F3342 Major depressive disorder, recurrent, in full remission: Secondary | ICD-10-CM | POA: Diagnosis not present

## 2017-10-04 DIAGNOSIS — Z0000 Encounter for general adult medical examination without abnormal findings: Secondary | ICD-10-CM | POA: Diagnosis not present

## 2017-10-04 DIAGNOSIS — F419 Anxiety disorder, unspecified: Secondary | ICD-10-CM | POA: Diagnosis not present

## 2017-10-04 LAB — CBC WITH DIFFERENTIAL/PLATELET
BASOS ABS: 0.1 10*3/uL (ref 0.0–0.1)
BASOS PCT: 1.5 % (ref 0.0–3.0)
EOS ABS: 0.1 10*3/uL (ref 0.0–0.7)
Eosinophils Relative: 1 % (ref 0.0–5.0)
HEMATOCRIT: 43.3 % (ref 36.0–46.0)
HEMOGLOBIN: 14.8 g/dL (ref 12.0–15.0)
LYMPHS PCT: 18.4 % (ref 12.0–46.0)
Lymphs Abs: 1 10*3/uL (ref 0.7–4.0)
MCHC: 34.2 g/dL (ref 30.0–36.0)
MCV: 95.8 fl (ref 78.0–100.0)
Monocytes Absolute: 0.4 10*3/uL (ref 0.1–1.0)
Monocytes Relative: 7.1 % (ref 3.0–12.0)
Neutro Abs: 4 10*3/uL (ref 1.4–7.7)
Neutrophils Relative %: 72 % (ref 43.0–77.0)
Platelets: 198 10*3/uL (ref 150.0–400.0)
RBC: 4.53 Mil/uL (ref 3.87–5.11)
RDW: 12.6 % (ref 11.5–15.5)
WBC: 5.5 10*3/uL (ref 4.0–10.5)

## 2017-10-04 LAB — BASIC METABOLIC PANEL
BUN: 13 mg/dL (ref 6–23)
CHLORIDE: 101 meq/L (ref 96–112)
CO2: 29 mEq/L (ref 19–32)
CREATININE: 0.87 mg/dL (ref 0.40–1.20)
Calcium: 9.4 mg/dL (ref 8.4–10.5)
GFR: 67.59 mL/min (ref >60.00)
Glucose, Bld: 94 mg/dL (ref 70–99)
Potassium: 4.2 mEq/L (ref 3.5–5.1)
Sodium: 136 mEq/L (ref 135–145)

## 2017-10-04 LAB — HDL CHOLESTEROL: HDL: 57.1 mg/dL (ref >39.00)

## 2017-10-04 LAB — CHOLESTEROL, TOTAL: CHOLESTEROL: 201 mg/dL — AB (ref 0–200)

## 2017-10-04 NOTE — Progress Notes (Signed)
Subjective:   KAISLEE CHAO is a 77 y.o. female who presents for Medicare Annual (Subsequent) preventive examination.  Reports health as good. Taking care of her spouse with memory issues; but is doing well now. Resources given.    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 Does not see them often   Vaccines are up to date  Has received the shingrix x 2 and will let us know the dates     Objective:     Vitals: BP 98/70   Pulse 88   Ht 5\' 2"  (1.575 m)   Wt 135 lb (61.2 kg)   BMI 24.69 kg/m   Body mass index is 24.69 kg/m.  Advanced Directives 10/04/2017 09/14/2016 05/25/2016 09/22/2015 09/10/2015 05/29/2015 09/20/2014  Does Patient Have a Medical Advance Directive? Yes Yes Yes Yes Yes No Yes  Type of Advance Directive - - Healthcare Power of [place] will  Does patient want to make changes to medical advance directive? - - - No - Patient declined - - -  Copy of [place] in Chart? - - No - copy requested Yes - - No - copy requested  Would patient like information on creating a medical advance directive? - - - - - No - patient declined information -  Pre-existing out of facility DNR order (yellow form or pink MOST form) - - - - - - -   YEs; Her is at [facility] Spouse is at [place] Use  Smoking Status Former Smoker  . Packs/day:  0.50  . Years: 50.00  . Pack years: 25.00  . Types: Cigarettes  . Last attempt to quit: 09/23/2011  . Years since quitting: 6.0  Smokeless Tobacco Never Used     Counseling given: Yes   Clinical Intake:   Past Medical History:  Diagnosis Date  . Allergic rhinitis   . Anemia   . Arthritis    all over  . Bilateral cataracts   . Chicken pox   . Cholelithiasis 05/21/2014   On CT scan 2015   . COPD (chronic obstructive pulmonary disease) ([place])   . Depression    hx ocd and anxiety in record as well  . Gout    last time 6-52yrs ago   . History of blood transfusion   . Hot flashes    takes Prozac daily  . Hx of migraines 90's   after menopause HA stopped  . Hyperlipidemia    taking Flax Seed Oil and Fish Oil  . Hypertension    takes Amlodipine nightly  . IBS (irritable bowel syndrome)   . Liver cyst   . Lung cancer ([place])   . Macrocytosis   . Migraine    MIGRAINES RESOLVED WITH MENOPAUSE  . Osteopenia    takes Calcium and Vit  D bid  . Pneumonia    x 2 ;last time back in 1999  . PONV (postoperative nausea and vomiting)   . 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 Seasonal allergies   . Smoking 09/12/2011   Past Surgical History:  Procedure Laterality Date  . CARPAL TUNNEL RELEASE  80's   right   . COLONOSCOPY    . cyst removed  >11yrs ago   from left leg  . DILATION AND CURETTAGE OF UTERUS     at age 3  . EXCISIONAL TOTAL KNEE ARTHROPLASTY Right 06/03/2014   Procedure: RIGHT KNEE SCAR EXCISION;  Surgeon: [name], MD;  [facility];  Service: Orthopedics;  Laterality: Right;  . INGUINAL HERNIA REPAIR  2000  . neuroplasty decompression median nerve at carpal tunnel    . ROTATOR CUFF REPAIR  2001   right  . TONSILLECTOMY  as a child   and adenoids  . TOTAL ABDOMINAL HYSTERECTOMY  2003  . TOTAL HIP ARTHROPLASTY  2011   left  . TOTAL HIP ARTHROPLASTY  03/07/2012   Procedure: TOTAL HIP ARTHROPLASTY ANTERIOR APPROACH;   Surgeon: [name], MD;  [facility];  Service: Orthopedics;  Laterality: Right;  . TOTAL KNEE ARTHROPLASTY Right 01/02/2013   Procedure: RIGHT TOTAL KNEE ARTHROPLASTY;  Surgeon: [name], MD;  [facility];  Service: Orthopedics;  Laterality: Right;  . TOTAL KNEE ARTHROPLASTY Left 04/03/2013   Procedure: LEFT TOTAL KNEE ARTHROPLASTY;  Surgeon: [name], MD;  [facility];  Service: Orthopedics;  Laterality: Left;  Marland Kitchen VIDEO BRONCHOSCOPY  10/20/2011   Procedure: VIDEO BRONCHOSCOPY;  Surgeon: Grace Isaac, MD;  Location[facility];  Service: Thoracic;  Laterality: N/A;  . WEDGE RESECTION  10-20-2011   rt lung  - for lung cancer   Family History  Problem Relation Age of Onset  . Hypertension Brother   . Alcohol abuse Unknown   . Depression Unknown   . Hearing loss Unknown   . Heart disease Unknown   . Hypertension Unknown   . Anesthesia problems Neg Hx   . Hypotension Neg Hx   . Malignant hyperthermia Neg Hx   . Pseudochol deficiency Neg Hx    Social History   Socioeconomic History  . Marital status: Married    Spouse name: Not on file  . Number of children: Not on file  . Years of education: Not on file  . Highest education level: Not on file  Social Needs  . Financial resource strain: Not on file  . Food insecurity - worry: Not on file  . Food insecurity - inability: Not on file  . Transportation needs - medical: Not on file  . Transportation needs - non-medical: Not on file  Occupational History  . Occupation: retired  Tobacco Use  . Smoking status: Former Smoker    Packs/day: 0.50    Years: 50.00    Pack years: 25.00    Types: Cigarettes    Last attempt to quit: 09/23/2011    Years since quitting: 6.0  . Smokeless tobacco: Never Used  Substance and Sexual Activity  . Alcohol use: Yes    Alcohol/week: 0.0 oz    Comment: 2 beers every night  . Drug use: No  . Sexual activity: No  Other Topics Concern  . Not on file  Social History  Narrative  . Not on file    Outpatient Encounter Medications  as of 10/04/2017  Medication Sig  . Acetaminophen (TYLENOL PO) Take by mouth as needed. Alternates with Ibuprofen  . albuterol (PROAIR HFA) 108 (90 Base) MCG/ACT inhaler INHALE TWO PUFFS BY MOUTH EVERY 6 HOURS AS NEEDED FOR WHEEZING OR SHORTNESS OF BREATH  . amLODipine (NORVASC) 5 MG tablet Take 1 tablet (5 mg total) by mouth at bedtime.  . Calcium-Magnesium-Zinc 500-250-12.5 MG TABS Take 1 tablet by mouth daily.  . Cholecalciferol (VITAMIN D-3 PO) Take 1,200 mg by mouth every morning.  . Cyanocobalamin (VITAMIN B-12 PO) Take by mouth.  Marland Kitchen FLUoxetine (PROZAC) 20 MG capsule Take 1 capsule (20 mg total) by mouth 2 (two) times daily.  . fluticasone (FLONASE) 50 MCG/ACT nasal spray Place 2 sprays into both nostrils daily.  . Fluticasone Furoate (ARNUITY ELLIPTA) 100 MCG/ACT AEPB Inhale 1 puff into the lungs daily.  . Glucosamine 500 MG CAPS Take 1,200 mg by mouth 2 (two) times daily.  . GuaiFENesin (MUCINEX PO) Take by mouth as needed.  . sodium chloride (OCEAN) 0.65 % SOLN nasal spray Place 1 spray into both nostrils daily as needed for congestion.  . triamcinolone ointment (KENALOG) 0.5 % Apply 1 application topically 2 (two) times daily.  Marland Kitchen umeclidinium-vilanterol (ANORO ELLIPTA) 62.5-25 MCG/INH AEPB Inhale 1 puff into the lungs daily.  . vitamin E 400 UNIT capsule Take 400 Units by mouth daily.  . [DISCONTINUED] Calcium Carbonate-Vitamin D (CALCIUM + D PO) Take by mouth daily. 500+400    No facility-administered encounter medications on file as of 10/04/2017.     Activities of Daily Living In your present state of health, do you have any difficulty performing the following activities: 10/04/2017  Hearing? (No Data)  Comment mild hearing loss   Vision? N  Difficulty concentrating or making decisions? N  Walking or climbing stairs? N  Dressing or bathing? N  Doing errands, shopping? N  Preparing Food and eating ? N  Using the  Toilet? N  In the past six months, have you accidently leaked urine? N  Do you have problems with loss of bowel control? N  Managing your Medications? N  Managing your Finances? N  Housekeeping or managing your Housekeeping? N  Some recent data might be hidden    Patient Care Team: [name], DO as PCP - General (Family Medicine) Brand Males, MD (Pulmonary Disease) Grace Isaac, MD (Cardiothoracic Surgery) Lorretta Harp, MD as Consulting Physician (Cardiology) Curt Bears, MD as Consulting Physician (Hematology and Oncology)    Assessment:   This is a routine wellness examination for Raigan.  Exercise Activities and Dietary recommendations Current Exercise Habits: Structured exercise class, Type of exercise: Other - see comments(pool), Time (Minutes): 60, Frequency (Times/Week): 3(very active but taking care of spouse; likes to travel), Weekly Exercise (Minutes/Week): 180  Goals    . Patient Stated     To start at the pool;       Fall Risk Fall Risk  10/04/2017 09/14/2016 09/22/2015 09/22/2015 09/20/2014  Falls in the past year? Yes Yes No No No  Comment - stepped on cats tail  - - -  Number falls in past yr: 1 1 - - -  Injury with Fall? No - - - -  Comment - - - - -  Risk Factor Category  - - - - -  Risk for fall due to : - - - - -  Follow up Education provided Education provided - - -  Comment states she falls at home x  1 but no injury  - - - -     Depression Screen PHQ 2/9 Scores 09/14/2016 09/22/2015 09/22/2015 09/20/2014  PHQ - 2 Score 0 0 0 0     Cognitive Function MMSE - Mini Mental State Exam 10/04/2017  Not completed: (No Data)     Ad8 score reviewed for issues:  Issues making decisions:  Less interest in hobbies / activities:  Repeats questions, stories (family complaining):  Trouble using ordinary gadgets (microwave, computer, phone):  Forgets the month or year:   Mismanaging finances:   Remembering appts:  Daily problems  with thinking and/or memory: Ad8 score is=0       Immunization History  Administered Date(s) Administered  . Influenza Split 06/02/2011, 05/09/2012  . Influenza, High Dose Seasonal PF 04/19/2016, 04/27/2017  . Influenza,inj,Quad PF,6+ Mos 05/18/2013, 04/17/2014, 05/21/2015  . Pneumococcal Conjugate-13 04/05/2014  . Pneumococcal Polysaccharide-23 08/09/1998, 09/21/2011  . Tdap 06/22/2012  . Zoster 08/10/2007   Screening Tests Health Maintenance  Topic Date Due  . COLONOSCOPY  11/08/2018  . MAMMOGRAM  09/22/2019  . TETANUS/TDAP  06/22/2022  . INFLUENZA VACCINE  Completed  . DEXA SCAN  Completed  . PNA vac Low Risk Adult  Completed         Plan:      PCP Notes   Health Maintenance Has shingrix vaccines and will get the dates for the office  Mammogram every year  Colonoscopy  due in 2020; would like to opt out, may discuss with GI when it is due. Mentioned cologuard but will discuss with GI next year.  Osteopenia; taking calcium and Vit D  Will go back to the pool for exercise   Abnormal Screens  Mild hearing loss   Referrals  None   Patient concerns; Taking care of spouse  Resources given   Nurse Concerns; As noted   Next PCP apt Today       I have personally reviewed and noted the following in the patient's chart:   . Medical and social history . Use of alcohol, tobacco or illicit drugs  . Current medications and supplements . Functional ability and status . Nutritional status . Physical activity . Advanced directives . List of other physicians . Hospitalizations, surgeries, and ER visits in previous 12 months . Vitals . Screenings to include cognitive, depression, and falls . Referrals and appointments  In addition, I have reviewed and discussed with patient certain preventive protocols, quality metrics, and best practice recommendations. A written personalized care plan for preventive services as well as general preventive health  recommendations were provided to patient.     Wynetta Fines, RN  10/04/2017

## 2017-10-04 NOTE — Progress Notes (Signed)
Hannah R Kim, DO  

## 2017-10-04 NOTE — Patient Instructions (Addendum)
  Ms. Hintze , Thank you for taking time to come for your Medicare Wellness Visit. I appreciate your ongoing commitment to your health goals. Please review the following plan we discussed and let me know if I can assist you in the future.   Will get dates of your shingrix administration for the office   These are the goals we discussed: Goals    None      This is a list of the screening recommended for you and due dates:  Health Maintenance  Topic Date Due  . Colon Cancer Screening  11/08/2018  . Mammogram  09/22/2019  . Tetanus Vaccine  06/22/2022  . Flu Shot  Completed  . DEXA scan (bone density measurement)  Completed  . Pneumonia vaccines  Completed

## 2017-10-04 NOTE — Patient Instructions (Signed)
BEFORE YOU LEAVE: -labs -follow up: 3-4 months  We have ordered labs or studies at this visit. It can take up to 1-2 weeks for results and processing. IF results require follow up or explanation, we will call you with instructions. Clinically stable results will be released to your [facility]. If you have not heard from [place] or cannot find your results in [facility] in 2 weeks please contact our office at [phone].  If you are not yet signed up for [facility], please consider signing up.   We recommend the following healthy lifestyle for LIFE: 1) Small portions. But, make sure to get regular (at least 3 per day), healthy meals and small healthy snacks if needed.  2) Eat a healthy clean diet.   TRY TO EAT: -at least 5-7 servings of low sugar, colorful, and nutrient rich vegetables per day (not corn, potatoes or bananas.) -berries are the best choice if you wish to eat fruit (only eat small amounts if trying to reduce weight)  -lean meets (fish, white meat of chicken or [place]) -vegan proteins for some meals - beans or tofu, whole grains, nuts and seeds -Replace bad fats with good fats - good fats include: fish, nuts and seeds, canola oil, olive oil -small amounts of low fat or non fat dairy -small amounts of100 % whole grains - check the lables -drink plenty of water  AVOID: -SUGAR, sweets, anything with added sugar, corn syrup or sweeteners - must read labels as even foods advertised as "healthy" often are loaded with sugar -if you must have a sweetener, small amounts of stevia may be best -sweetened beverages and artificially sweetened beverages -simple starches (rice, bread, potatoes, pasta, chips, etc - small amounts of 100% whole grains are ok) -red meat, pork, butter -fried foods, fast food, processed food, excessive dairy, eggs and coconut.  3)Get at least 150 minutes of sweaty aerobic exercise per week.  4)Reduce stress - consider counseling, meditation and relaxation to balance  other aspects of your life.

## 2017-10-06 MED ORDER — ROSUVASTATIN CALCIUM 5 MG PO TABS: 5.0000 mg | ORAL_TABLET | Freq: Every day | ORAL | 3 refills | Status: DC

## 2017-10-06 NOTE — Addendum Note (Signed)
Addended by: Agnes Lawrence on: 10/06/2017 05:54 PM   Modules accepted: Orders

## 2017-10-24 DIAGNOSIS — J449 Chronic obstructive pulmonary disease, unspecified: Secondary | ICD-10-CM | POA: Diagnosis not present

## 2017-10-24 DIAGNOSIS — M25531 Pain in right wrist: Secondary | ICD-10-CM | POA: Diagnosis not present

## 2017-10-24 DIAGNOSIS — J309 Allergic rhinitis, unspecified: Secondary | ICD-10-CM | POA: Diagnosis not present

## 2017-11-29 ENCOUNTER — Ambulatory Visit: Payer: Self-pay

## 2017-11-29 ENCOUNTER — Encounter: Payer: Self-pay | Admitting: Family Medicine

## 2017-11-29 ENCOUNTER — Ambulatory Visit (INDEPENDENT_AMBULATORY_CARE_PROVIDER_SITE_OTHER): Payer: Medicare Other | Admitting: Family Medicine

## 2017-11-29 VITALS — BP 118/80 | HR 80 | Temp 98.5°F | Ht 62.0 in | Wt 134.5 lb

## 2017-11-29 DIAGNOSIS — R42 Dizziness and giddiness: Secondary | ICD-10-CM | POA: Diagnosis not present

## 2017-11-29 NOTE — Progress Notes (Signed)
HPI:  Using dictation device. Unfortunately this device frequently misinterprets words/phrases.  Acute visit for vertigo: -Intermittent for many years -Brief episodes of vertigo triggered by certain movements of the head or rolling over in bed, usually lasts a few days to a week -She treats this with Dramamine which usually helps -Reports this episode started about a week ago -Reports she has never done vestibular rehab for this -This episode feels the same as prior, thinks is towards the right -Denies weakness, numbness, headache, fever, malaise, speech or vision changes or any other neurological deficits -She actually is feeling better currently   ROS: See pertinent positives and negatives per HPI.  Past Medical History:  Diagnosis Date  . Allergic rhinitis   . Anemia   . Arthritis    all over  . Bilateral cataracts   . Chicken pox   . Cholelithiasis 05/21/2014   On CT scan 2015   . COPD (chronic obstructive pulmonary disease) ([place])   . Depression    hx ocd and anxiety in record as well  . Gout    last time 6-58yrs ago   . History of blood transfusion   . Hot flashes    takes Prozac daily  . Hx of migraines 90's   after menopause HA stopped  . Hyperlipidemia    taking Flax Seed Oil and Fish Oil  . Hypertension    takes Amlodipine nightly  . IBS (irritable bowel syndrome)   . Liver cyst   . Lung cancer ([place])   . Macrocytosis   . Migraine    MIGRAINES RESOLVED WITH MENOPAUSE  . Osteopenia    takes Calcium and Vit D bid  . Pneumonia    x 2 ;last time back in 1999  . PONV (postoperative nausea and vomiting)   . 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 Seasonal allergies   . Smoking 09/12/2011    Past Surgical History:  Procedure Laterality Date  . CARPAL TUNNEL RELEASE  80's   right   . COLONOSCOPY    . cyst removed  >18yrs ago   from left leg  . DILATION AND CURETTAGE OF UTERUS     at age 45  . EXCISIONAL TOTAL KNEE  ARTHROPLASTY Right 06/03/2014   Procedure: RIGHT KNEE SCAR EXCISION;  Surgeon: [name], MD;  Location: [facility];  Service: Orthopedics;  Laterality: Right;  . INGUINAL HERNIA REPAIR  2000  . neuroplasty decompression median nerve at carpal tunnel    . ROTATOR CUFF REPAIR  2001   right  . TONSILLECTOMY  as a child   and adenoids  . TOTAL ABDOMINAL HYSTERECTOMY  2003  . TOTAL HIP ARTHROPLASTY  2011   left  . TOTAL HIP ARTHROPLASTY  03/07/2012   Procedure: TOTAL HIP ARTHROPLASTY ANTERIOR APPROACH;  Surgeon: [name], MD;  [facility];  Service: Orthopedics;  Laterality: Right;  . TOTAL KNEE ARTHROPLASTY Right 01/02/2013   Procedure: RIGHT TOTAL KNEE ARTHROPLASTY;  Surgeon: [name], MD;  [facility];  Service: Orthopedics;  Laterality: Right;  . TOTAL KNEE ARTHROPLASTY Left 04/03/2013   Procedure: LEFT TOTAL KNEE ARTHROPLASTY;  Surgeon: [name], MD;  [facility];  Service: Orthopedics;  Laterality: Left;  Marland Kitchen VIDEO BRONCHOSCOPY  10/20/2011   Procedure: VIDEO BRONCHOSCOPY;  Surgeon: Grace Isaac, MD;  Location: [place];  Service: Thoracic;  Laterality: N/A;  . WEDGE RESECTION  10-20-2011   rt  lung  - for lung cancer    Family History  Problem Relation Age of Onset  . Hypertension Brother   . Alcohol abuse Unknown   . Depression Unknown   . Hearing loss Unknown   . Heart disease Unknown   . Hypertension Unknown   . Anesthesia problems Neg Hx   . Hypotension Neg Hx   . Malignant hyperthermia Neg Hx   . Pseudochol deficiency Neg Hx     SOCIAL HX: See HPI   Current Outpatient Medications:  .  Acetaminophen (TYLENOL PO), Take by mouth as needed. Alternates with Ibuprofen, Disp: , Rfl:  .  albuterol (PROAIR HFA) 108 (90 Base) MCG/ACT inhaler, INHALE TWO PUFFS BY MOUTH EVERY 6 HOURS AS NEEDED FOR WHEEZING OR SHORTNESS OF BREATH, Disp: 1 Inhaler, Rfl: 5 .  amLODipine (NORVASC) 5 MG tablet, Take 1 tablet (5 mg total) by mouth at bedtime., Disp: 90  tablet, Rfl: 3 .  Calcium-Magnesium-Zinc 500-250-12.5 MG TABS, Take 1 tablet by mouth daily., Disp: , Rfl:  .  Cholecalciferol (VITAMIN D-3 PO), Take 1,200 mg by mouth every morning., Disp: , Rfl:  .  Cyanocobalamin (VITAMIN B-12 PO), Take by mouth., Disp: , Rfl:  .  FLUoxetine (PROZAC) 20 MG capsule, Take 1 capsule (20 mg total) by mouth 2 (two) times daily., Disp: 180 capsule, Rfl: 3 .  fluticasone (FLONASE) 50 MCG/ACT nasal spray, Place 2 sprays into both nostrils daily., Disp: 16 g, Rfl: 5 .  Fluticasone Furoate (ARNUITY ELLIPTA) 100 MCG/ACT AEPB, Inhale 1 puff into the lungs daily., Disp: 1 each, Rfl: 5 .  Glucosamine 500 MG CAPS, Take 1,200 mg by mouth 2 (two) times daily., Disp: , Rfl:  .  GuaiFENesin (MUCINEX PO), Take by mouth as needed., Disp: , Rfl:  .  rosuvastatin (CRESTOR) 5 MG tablet, Take 1 tablet (5 mg total) by mouth daily., Disp: 90 tablet, Rfl: 3 .  sodium chloride (OCEAN) 0.65 % SOLN nasal spray, Place 1 spray into both nostrils daily as needed for congestion., Disp: , Rfl:  .  triamcinolone ointment (KENALOG) 0.5 %, Apply 1 application topically 2 (two) times daily., Disp: 30 g, Rfl: 0 .  umeclidinium-vilanterol (ANORO ELLIPTA) 62.5-25 MCG/INH AEPB, Inhale 1 puff into the lungs daily., Disp: 1 each, Rfl: 5 .  vitamin E 400 UNIT capsule, Take 400 Units by mouth daily., Disp: , Rfl:   EXAM:  Vitals:   11/29/17 1402  BP: 118/80  Pulse: 80  Temp: 98.5 F (36.9 C)  SpO2: 93%    Body mass index is 24.6 kg/m.  GENERAL: vitals reviewed and listed above, alert, oriented, appears well hydrated and in no acute distress  HEENT: atraumatic, conjunttiva clear, no obvious abnormalities on inspection of external nose and ears  NECK: no obvious masses on inspection  LUNGS: clear to auscultation bilaterally, no wheezes, rales or rhonchi, good air movement  CV: HRRR, no peripheral edema  MS: moves all extremities without noticeable abnormality  PSYCH/NEURO: pleasant and  cooperative, no obvious depression or anxiety, cranial nerves II through XII grossly intact, finger to nose is normal, visual acuity is grossly intact with her glasses on, Dix-Hallpike is negative, no nystagmus  ASSESSMENT AND PLAN:  Discussed the following assessment and plan:  Vertigo - Plan: Ambulatory referral to Physical Therapy  -we discussed possible serious and likely etiologies, workup and treatment, treatment risks and return precautions -likely BPPV given chronicity, symptoms -after this discussion, Kalli opted for referral for vestibular rehab, though this episode may be  resolving, Dramamine as needed since this works for her, advised not to drive with vertigo -follow up advised as scheduled and as needed -of course, we advised Dominik  to return or notify a doctor immediately if symptoms worsen or persist or new concerns arise. -She actually feels a bit better now  Patient Instructions  Do not drive with vertigo  -We placed a referral for you as discussed for vestibular rehab for vertigo. It usually takes about 1-2 weeks to process and schedule this referral. If you have not heard from [place] regarding this appointment in 2 weeks please contact our office.  I hope you are feeling better soon! Seek care promptly if your symptoms worsen, new concerns arise or you are not improving with treatment.     [name], DO

## 2017-11-29 NOTE — Patient Instructions (Addendum)
Do not drive with vertigo  -We placed a referral for you as discussed for vestibular rehab for vertigo. It usually takes about 1-2 weeks to process and schedule this referral. If you have not heard from [place] regarding this appointment in 2 weeks please contact our office.  I hope you are feeling better soon! Seek care promptly if your symptoms worsen, new concerns arise or you are not improving with treatment.

## 2017-11-29 NOTE — Telephone Encounter (Signed)
Pt calling with 1 week h/o vertigo. Pt states that she feels like she is spinning and denies lightheadeness. Pt states that she feels very unsteady when she walks  And notes sx when she turns over in bed.  Pt states that sx are worse in the am and by afternoon the sx are gone. Pt has been taking dramamine to help with sx. Yesterday after a week of tx she did not need her dramamine and was able to do yard work. Today she woke up with vertigo. Pt states that sx comes and goes. Pt thinks that the pollen is causing her sx.  Care advice given per protocol. Appt made for today with PCP at 2:00 pm. Reason for Disposition . [1] MODERATE dizziness (e.g., vertigo; feels very unsteady, interferes with normal activities) AND [2] has NOT been evaluated by physician for this  Answer Assessment - Initial Assessment Questions 1. DESCRIPTION: "Describe your dizziness."     Trouble walking  2. VERTIGO: "Do you feel like either you or the room is spinning or tilting?"     Pt spinning 3. LIGHTHEADED: "Do you feel lightheaded?" (e.g., somewhat faint, woozy, weak upon standing)     no 4. SEVERITY: "How bad is it?"  "Can you walk?"   - MILD - Feels unsteady but walking normally.   - MODERATE - Feels very unsteady when walking, but not falling; interferes with normal activities (e.g., school, work) .   - SEVERE - Unable to walk without falling (requires assistance).     Moderate comes and goes 5. ONSET:  "When did the dizziness begin?"     1 week ago 6. AGGRAVATING FACTORS: "Does anything make it worse?" (e.g., standing, change in head position)   Worse in the am better by the evening- walking makes it worse and turning over in bed 7. CAUSE: "What do you think is causing the dizziness?"     Pollen  8. RECURRENT SYMPTOM: "Have you had dizziness before?" If so, ask: "When was the last time?" "What happened that time?"     Yes-not sure of the last time-doctor was going to check pt for positional vertigo but never  was evaluated 9. OTHER SYMPTOMS: "Do you have any other symptoms?" (e.g., headache, weakness, numbness, vomiting, earache)     no 10. PREGNANCY: "Is there any chance you are pregnant?" "When was your last menstrual period?"       n/a  Protocols used: DIZZINESS - VERTIGO-A-AH

## 2017-12-07 ENCOUNTER — Other Ambulatory Visit: Payer: Self-pay | Admitting: Internal Medicine

## 2017-12-26 ENCOUNTER — Ambulatory Visit: Payer: Medicare Other | Attending: Family Medicine | Admitting: Physical Therapy

## 2017-12-26 DIAGNOSIS — R42 Dizziness and giddiness: Secondary | ICD-10-CM | POA: Diagnosis not present

## 2017-12-26 NOTE — Patient Instructions (Addendum)
Self Treatment for Right Posterior / Anterior Canalithiasis    Sitting on bed: 1. Turn head 45 right. (a) Lie back slowly, shoulders on pillow, head on bed. (b) Hold _30___ seconds. 2. Keeping head on bed, turn head 90 left. Hold __30__ seconds. 3. Roll to left, head on 45 angle down toward bed. Hold __30__ seconds. 4. Sit up on left side of bed. Repeat __3__ times per session. Do _1-2___ sessions per day.  Copyright  VHI. All rights reserved.  Sit to Side-Lying    Sit on edge of bed. 1. Turn head 45 to right. 2. Maintain head position and lie down slowly on left side. Hold until symptoms subside. 3. Sit up slowly. Hold until symptoms subside. 4. Turn head 45 to left. 5. Maintain head position and lie down slowly on right side. Hold until symptoms subside. 6. Sit up slowly. Repeat sequence __5__ times per session. Do _3-5___ sessions per day.  Copyright  VHI. All rights reserved.

## 2017-12-27 ENCOUNTER — Other Ambulatory Visit: Payer: Self-pay

## 2017-12-27 ENCOUNTER — Encounter: Payer: Self-pay | Admitting: Physical Therapy

## 2017-12-27 NOTE — Therapy (Addendum)
[facility] [address] [place], [place], [address] Phone: [phone]   Fax:  [phone]  Physical Therapy Evaluation  Patient Details  Name: [name] MRN: [id number] Date of Birth: 1940/11/18 Referring Provider: Colin Benton, DO   Encounter Date: 12/26/2017    Past Medical History:  Diagnosis Date  . Allergic rhinitis   . Anemia   . Arthritis    all over  . Bilateral cataracts   . Chicken pox   . Cholelithiasis 05/21/2014   On CT scan 2015   . COPD (chronic obstructive pulmonary disease) ([place])   . Depression    hx ocd and anxiety in record as well  . Gout    last time 6-96yrs ago   . History of blood transfusion   . Hot flashes    takes Prozac daily  . Hx of migraines 90's   after menopause HA stopped  . Hyperlipidemia    taking Flax Seed Oil and Fish Oil  . Hypertension    takes Amlodipine nightly  . IBS (irritable bowel syndrome)   . Liver cyst   . Lung cancer ([place])   . Macrocytosis   . Migraine    MIGRAINES RESOLVED WITH MENOPAUSE  . Osteopenia    takes Calcium and Vit D bid  . Pneumonia    x 2 ;last time back in 1999  . PONV (postoperative nausea and vomiting)   . 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 Seasonal allergies   . Smoking 09/12/2011    Past Surgical History:  Procedure Laterality Date  . CARPAL TUNNEL RELEASE  80's   right   . COLONOSCOPY    . cyst removed  >63yrs ago   from left leg  . DILATION AND CURETTAGE OF UTERUS     at age 89  . EXCISIONAL TOTAL KNEE ARTHROPLASTY Right 06/03/2014   Procedure: RIGHT KNEE SCAR EXCISION;  Surgeon: [name], MD;  [facility];  Service: Orthopedics;  Laterality: Right;  . INGUINAL HERNIA REPAIR  2000  . neuroplasty decompression median nerve at carpal tunnel    . ROTATOR CUFF REPAIR  2001   right  . TONSILLECTOMY  as a child   and adenoids  . TOTAL ABDOMINAL HYSTERECTOMY  2003   . TOTAL HIP ARTHROPLASTY  2011   left  . TOTAL HIP ARTHROPLASTY  03/07/2012   Procedure: TOTAL HIP ARTHROPLASTY ANTERIOR APPROACH;  Surgeon: [name], MD;  [facility];  Service: Orthopedics;  Laterality: Right;  . TOTAL KNEE ARTHROPLASTY Right 01/02/2013   Procedure: RIGHT TOTAL KNEE ARTHROPLASTY;  Surgeon: [name], MD;  [facility];  Service: Orthopedics;  Laterality: Right;  . TOTAL KNEE ARTHROPLASTY Left 04/03/2013   Procedure: LEFT TOTAL KNEE ARTHROPLASTY;  Surgeon: [name], MD;  [facility];  Service: Orthopedics;  Laterality: Left;  Marland Kitchen VIDEO BRONCHOSCOPY  10/20/2011   Procedure: VIDEO BRONCHOSCOPY;  Surgeon: Grace Isaac, MD;  Location: [place];  Service: Thoracic;  Laterality: N/A;  . WEDGE RESECTION  10-20-2011   rt lung  - for lung cancer    There were no vitals filed for this visit.       [facility] PT Assessment - 12/31/17 0001      Assessment   Medical Diagnosis  Vertigo    Referring Provider  Colin Benton, DO    Onset Date/Surgical Date  -- April 2019  Precautions   Precautions  None      Balance Screen   Has the patient fallen in the past 6 months  No    Has the patient had a decrease in activity level because of a fear of falling?   No    Is the patient reluctant to leave their home because of a fear of falling?   No      Prior Function   Level of Independence  Independent           Vestibular Assessment - 12/31/17 0001      Symptom Behavior   Type of Dizziness  Unsteady with head/body turns    Frequency of Dizziness  episode     Duration of Dizziness  hours - last episode lasted 4-5 days consecutive     Aggravating Factors  Activity in general;Driving    Relieving Factors  Comments;Rest Dramamine      Positional Testing   Dix-Hallpike  Dix-Hallpike Right;Dix-Hallpike Left      Dix-Hallpike Right   Dix-Hallpike Right Duration  none    Dix-Hallpike Right Symptoms  No nystagmus      Dix-Hallpike Left    Dix-Hallpike Left Duration  none    Dix-Hallpike Left Symptoms  No nystagmus          Objective measurements completed on examination: See above findings.              PT Education - 12/31/17 1755    Education provided  Yes    Education Details  instructed pt in sit to sidelying for BPPV should it re-occur and also in Epley maneuver for self treatment of BPPV prn    Person(s) Educated  Patient    Methods  Explanation;Demonstration;Handout    Comprehension  Verbalized understanding                  Plan - 12/31/17 1756    Clinical Impression Statement  Pt presents with no signs or symptoms of vertigo at this time - symptoms consistent with episode of BPPV at onset which has now resolved; pt to be placed on hold for 1 month to call for appt should vertigo re-occur    Clinical Presentation  Stable    Clinical Decision Making  Low    Rehab Potential  Good    PT Frequency  One time visit    PT Treatment/Interventions  Patient/family education    PT Next Visit Plan  N/A - pt to call for appt in next 30 days prn       Patient will benefit from skilled therapeutic intervention in order to improve the following deficits and impairments:  Dizziness  Visit Diagnosis: Dizziness and giddiness     Problem List Patient Active Problem List   Diagnosis Date Noted  . Atherosclerosis of aorta ([place]) 05/24/2016  . Moderate COPD (chronic obstructive pulmonary disease), Sees Dr. Chase Caller, [facility] 05/21/2015  . Chronic infection of sinus 05/21/2015  . Squamous cell carcinoma of right lung ([place]) 04/21/2015  . Chronic rhinosinusitis 06/24/2013  . Hypertension 06/22/2012  . 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

## 2018-01-26 NOTE — Progress Notes (Signed)
HPI:  Using dictation device. Unfortunately this device frequently misinterprets words/phrases.  [name] is a pleasant 77 y.o. here for follow up. Chronic medical problems summarized below were reviewed for changes and stability and were updated as needed below. These issues and their treatment remain stable for the most part. Doing well except some R hip pain and reports she will see her orthopedic doctor about this in a few days. Denies CP, SOB, DOE, treatment intolerance or new symptoms. Started crestor after last labs, very low dose as pt reluctant. Due for labs AWV 10/04/17 Vertigo  HTN, Aortic atherosclerosis/HLD: -meds: norvasc, crestor started 2/19 -Prefers not to take cholesterol medications for  Depression and anxiety: -meds: SSRI - prozac  COPD, hx lung Ca: -sees pulmonology and onc for management  Osteopenia -on vit D   ROS: See pertinent positives and negatives per HPI.  Past Medical History:  Diagnosis Date  . Allergic rhinitis   . Anemia   . Arthritis    all over  . Bilateral cataracts   . Chicken pox   . Cholelithiasis 05/21/2014   On CT scan 2015   . COPD (chronic obstructive pulmonary disease) ([place])   . Depression    hx ocd and anxiety in record as well  . Gout    last time 6-77yrs ago   . History of blood transfusion   . Hot flashes    takes Prozac daily  . Hx of migraines 90's   after menopause HA stopped  . Hyperlipidemia    taking Flax Seed Oil and Fish Oil  . Hypertension    takes Amlodipine nightly  . IBS (irritable bowel syndrome)   . Liver cyst   . Lung cancer ([place])   . Macrocytosis   . Migraine    MIGRAINES RESOLVED WITH MENOPAUSE  . Osteopenia    takes Calcium and Vit D bid  . Pneumonia    x 2 ;last time back in 1999  . PONV (postoperative nausea and vomiting)   . 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 Seasonal allergies   . Smoking 09/12/2011    Past Surgical History:   Procedure Laterality Date  . CARPAL TUNNEL RELEASE  80's   right   . COLONOSCOPY    . cyst removed  >55yrs ago   from left leg  . DILATION AND CURETTAGE OF UTERUS     at age 37  . EXCISIONAL TOTAL KNEE ARTHROPLASTY Right 06/03/2014   Procedure: RIGHT KNEE SCAR EXCISION;  Surgeon: [name], MD;  Location: [facility];  Service: Orthopedics;  Laterality: Right;  . INGUINAL HERNIA REPAIR  2000  . neuroplasty decompression median nerve at carpal tunnel    . ROTATOR CUFF REPAIR  2001   right  . TONSILLECTOMY  as a child   and adenoids  . TOTAL ABDOMINAL HYSTERECTOMY  2003  . TOTAL HIP ARTHROPLASTY  2011   left  . TOTAL HIP ARTHROPLASTY  03/07/2012   Procedure: TOTAL HIP ARTHROPLASTY ANTERIOR APPROACH;  Surgeon: [name], MD;  [facility];  Service: Orthopedics;  Laterality: Right;  . TOTAL KNEE ARTHROPLASTY Right 01/02/2013   Procedure: RIGHT TOTAL KNEE ARTHROPLASTY;  Surgeon: [name], MD;  [facility];  Service: Orthopedics;  Laterality: Right;  . TOTAL KNEE ARTHROPLASTY Left 04/03/2013   Procedure: LEFT TOTAL KNEE ARTHROPLASTY;  Surgeon: [name], MD;  [facility];  Service: Orthopedics;  Laterality:  Left;  . VIDEO BRONCHOSCOPY  10/20/2011   Procedure: VIDEO BRONCHOSCOPY;  Surgeon: Grace Isaac, MD;  Location: [facility];  Service: Thoracic;  Laterality: N/A;  . WEDGE RESECTION  10-20-2011   rt lung  - for lung cancer    Family History  Problem Relation Age of Onset  . Hypertension Brother   . Alcohol abuse Unknown   . Depression Unknown   . Hearing loss Unknown   . Heart disease Unknown   . Hypertension Unknown   . Anesthesia problems Neg Hx   . Hypotension Neg Hx   . Malignant hyperthermia Neg Hx   . Pseudochol deficiency Neg Hx     SOCIAL HX: see hpi   Current Outpatient Medications:  .  Acetaminophen (TYLENOL PO), Take by mouth as needed. Alternates with Ibuprofen, Disp: , Rfl:  .  albuterol (PROAIR HFA) 108 (90 Base) MCG/ACT inhaler,  INHALE TWO PUFFS BY MOUTH EVERY 6 HOURS AS NEEDED FOR WHEEZING OR SHORTNESS OF BREATH, Disp: 1 Inhaler, Rfl: 5 .  amLODipine (NORVASC) 5 MG tablet, Take 1 tablet (5 mg total) by mouth at bedtime., Disp: 90 tablet, Rfl: 3 .  ARNUITY ELLIPTA 100 MCG/ACT AEPB, INHALE 1 PUFF BY MOUTH ONCE DAILY, Disp: 30 each, Rfl: 5 .  Calcium-Magnesium-Zinc 500-250-12.5 MG TABS, Take 1 tablet by mouth daily., Disp: , Rfl:  .  Cholecalciferol (VITAMIN D-3 PO), Take 1,200 mg by mouth every morning., Disp: , Rfl:  .  Cyanocobalamin (VITAMIN B-12 PO), Take by mouth., Disp: , Rfl:  .  FLUoxetine (PROZAC) 20 MG capsule, Take 1 capsule (20 mg total) by mouth 2 (two) times daily., Disp: 180 capsule, Rfl: 3 .  fluticasone (FLONASE) 50 MCG/ACT nasal spray, Place 2 sprays into both nostrils daily., Disp: 16 g, Rfl: 5 .  Glucosamine 500 MG CAPS, Take 1,200 mg by mouth 2 (two) times daily., Disp: , Rfl:  .  GuaiFENesin (MUCINEX PO), Take by mouth as needed., Disp: , Rfl:  .  rosuvastatin (CRESTOR) 5 MG tablet, Take 1 tablet (5 mg total) by mouth daily., Disp: 90 tablet, Rfl: 3 .  sodium chloride (OCEAN) 0.65 % SOLN nasal spray, Place 1 spray into both nostrils daily as needed for congestion., Disp: , Rfl:  .  triamcinolone ointment (KENALOG) 0.5 %, Apply 1 application topically 2 (two) times daily., Disp: 30 g, Rfl: 0 .  umeclidinium-vilanterol (ANORO ELLIPTA) 62.5-25 MCG/INH AEPB, Inhale 1 puff into the lungs daily., Disp: 1 each, Rfl: 5 .  vitamin E 400 UNIT capsule, Take 400 Units by mouth daily., Disp: , Rfl:   EXAM:  Vitals:   01/30/18 0928  BP: 100/64  Pulse: 69  Temp: 98 F (36.7 C)  SpO2: 96%    Body mass index is 23.69 kg/m.  GENERAL: vitals reviewed and listed above, alert, oriented, appears well hydrated and in no acute distress  HEENT: atraumatic, conjunttiva clear, no obvious abnormalities on inspection of external nose and ears  NECK: no obvious masses on inspection  LUNGS: clear to auscultation  bilaterally, no wheezes, rales or rhonchi, good air movement  CV: HRRR, no peripheral edema  MS: moves all extremities without noticeable abnormality  PSYCH: pleasant and cooperative, no obvious depression or anxiety  ASSESSMENT AND PLAN:  Discussed the following assessment and plan:  Hyperlipidemia, unspecified hyperlipidemia type - Plan: Lipid panel  Atherosclerosis of aorta (HCC)  Essential hypertension - Plan: Basic metabolic panel, CBC  Moderate COPD (chronic obstructive pulmonary disease), Sees Dr. Chase Caller, [facility]  -labs  per orders, fasting -lifestyle recs -she plans to see ortho about the hip issue, advised her to follow up here if not felt to be an orthopedic issue or other concerns -follow up 3-4 months or as needed sooner  Patient Instructions  BEFORE YOU LEAVE: -labs -follow up: 3-4 months or sooner if needed  See the orthopedic doctor about the hip and follow up here if they do not feel it is a musculoskeletal issue.  We have ordered labs or studies at this visit. It can take up to 1-2 weeks for results and processing. IF results require follow up or explanation, we will call you with instructions. Clinically stable results will be released to your [facility]. If you have not heard from [place] or cannot find your results in [facility] in 2 weeks please contact our office at [phone].  If you are not yet signed up for [facility], please consider signing up.   We recommend the following healthy lifestyle for LIFE: 1) Small portions. But, make sure to get regular (at least 3 per day), healthy meals and small healthy snacks if needed.  2) Eat a healthy clean diet.   TRY TO EAT: -at least 5-7 servings of low sugar, colorful, and nutrient rich vegetables per day (not corn, potatoes or bananas.) -berries are the best choice if you wish to eat fruit (only eat small amounts if trying to reduce weight)  -lean meets (fish, white meat of chicken or [place]) -vegan proteins for some  meals - beans or tofu, whole grains, nuts and seeds -Replace bad fats with good fats - good fats include: fish, nuts and seeds, canola oil, olive oil -small amounts of low fat or non fat dairy -small amounts of100 % whole grains - check the lables -drink plenty of water  AVOID: -SUGAR, sweets, anything with added sugar, corn syrup or sweeteners - must read labels as even foods advertised as "healthy" often are loaded with sugar -if you must have a sweetener, small amounts of stevia may be best -sweetened beverages and artificially sweetened beverages -simple starches (rice, bread, potatoes, pasta, chips, etc - small amounts of 100% whole grains are ok) -red meat, pork, butter -fried foods, fast food, processed food, excessive dairy, eggs and coconut.  3)Get at least 150 minutes of sweaty aerobic exercise per week.  4)Reduce stress - consider counseling, meditation and relaxation to balance other aspects of your life.         [name], DO

## 2018-01-30 ENCOUNTER — Ambulatory Visit (INDEPENDENT_AMBULATORY_CARE_PROVIDER_SITE_OTHER): Payer: Medicare Other | Admitting: Family Medicine

## 2018-01-30 ENCOUNTER — Encounter: Payer: Self-pay | Admitting: Family Medicine

## 2018-01-30 VITALS — BP 100/64 | HR 69 | Temp 98.0°F | Ht 62.0 in | Wt 129.5 lb

## 2018-01-30 DIAGNOSIS — E785 Hyperlipidemia, unspecified: Secondary | ICD-10-CM | POA: Diagnosis not present

## 2018-01-30 DIAGNOSIS — I700 Atherosclerosis of aorta: Secondary | ICD-10-CM | POA: Diagnosis not present

## 2018-01-30 DIAGNOSIS — J449 Chronic obstructive pulmonary disease, unspecified: Secondary | ICD-10-CM

## 2018-01-30 DIAGNOSIS — I10 Essential (primary) hypertension: Secondary | ICD-10-CM | POA: Diagnosis not present

## 2018-01-30 LAB — CBC
HCT: 43.2 % (ref 36.0–46.0)
HEMOGLOBIN: 14.8 g/dL (ref 12.0–15.0)
MCHC: 34.3 g/dL (ref 30.0–36.0)
MCV: 96.7 fl (ref 78.0–100.0)
PLATELETS: 205 10*3/uL (ref 150.0–400.0)
RBC: 4.47 Mil/uL (ref 3.87–5.11)
RDW: 12.2 % (ref 11.5–15.5)
WBC: 6.2 10*3/uL (ref 4.0–10.5)

## 2018-01-30 LAB — BASIC METABOLIC PANEL
BUN: 10 mg/dL (ref 6–23)
CALCIUM: 9.4 mg/dL (ref 8.4–10.5)
CHLORIDE: 101 meq/L (ref 96–112)
CO2: 26 meq/L (ref 19–32)
Creatinine, Ser: 0.81 mg/dL (ref 0.40–1.20)
GFR: 73.33 mL/min (ref >60.00)
GLUCOSE: 96 mg/dL (ref 70–99)
POTASSIUM: 4.3 meq/L (ref 3.5–5.1)
Sodium: 136 mEq/L (ref 135–145)

## 2018-01-30 LAB — LIPID PANEL
Cholesterol: 152 mg/dL (ref 0–200)
HDL: 70.2 mg/dL (ref >39.00)
LDL Cholesterol: 65 mg/dL (ref 0–99)
NONHDL: 81.54
TRIGLYCERIDES: 83 mg/dL (ref 0.0–149.0)
Total CHOL/HDL Ratio: 2
VLDL: 16.6 mg/dL (ref 0.0–40.0)

## 2018-01-30 NOTE — Patient Instructions (Signed)
BEFORE YOU LEAVE: -labs -follow up: 3-4 months or sooner if needed  See the orthopedic doctor about the hip and follow up here if they do not feel it is a musculoskeletal issue.  We have ordered labs or studies at this visit. It can take up to 1-2 weeks for results and processing. IF results require follow up or explanation, we will call you with instructions. Clinically stable results will be released to your [facility]. If you have not heard from [place] or cannot find your results in [facility] in 2 weeks please contact our office at [phone].  If you are not yet signed up for [facility], please consider signing up.   We recommend the following healthy lifestyle for LIFE: 1) Small portions. But, make sure to get regular (at least 3 per day), healthy meals and small healthy snacks if needed.  2) Eat a healthy clean diet.   TRY TO EAT: -at least 5-7 servings of low sugar, colorful, and nutrient rich vegetables per day (not corn, potatoes or bananas.) -berries are the best choice if you wish to eat fruit (only eat small amounts if trying to reduce weight)  -lean meets (fish, white meat of chicken or [place]) -vegan proteins for some meals - beans or tofu, whole grains, nuts and seeds -Replace bad fats with good fats - good fats include: fish, nuts and seeds, canola oil, olive oil -small amounts of low fat or non fat dairy -small amounts of100 % whole grains - check the lables -drink plenty of water  AVOID: -SUGAR, sweets, anything with added sugar, corn syrup or sweeteners - must read labels as even foods advertised as "healthy" often are loaded with sugar -if you must have a sweetener, small amounts of stevia may be best -sweetened beverages and artificially sweetened beverages -simple starches (rice, bread, potatoes, pasta, chips, etc - small amounts of 100% whole grains are ok) -red meat, pork, butter -fried foods, fast food, processed food, excessive dairy, eggs and coconut.  3)Get at least  150 minutes of sweaty aerobic exercise per week.  4)Reduce stress - consider counseling, meditation and relaxation to balance other aspects of your life.

## 2018-02-03 DIAGNOSIS — M25551 Pain in right hip: Secondary | ICD-10-CM | POA: Diagnosis not present

## 2018-02-03 DIAGNOSIS — Z96641 Presence of right artificial hip joint: Secondary | ICD-10-CM | POA: Diagnosis not present

## 2018-02-03 DIAGNOSIS — Z471 Aftercare following joint replacement surgery: Secondary | ICD-10-CM | POA: Diagnosis not present

## 2018-02-07 ENCOUNTER — Other Ambulatory Visit: Payer: Self-pay | Admitting: Internal Medicine

## 2018-05-15 DIAGNOSIS — H52203 Unspecified astigmatism, bilateral: Secondary | ICD-10-CM | POA: Diagnosis not present

## 2018-05-15 DIAGNOSIS — Z961 Presence of intraocular lens: Secondary | ICD-10-CM | POA: Diagnosis not present

## 2018-05-15 DIAGNOSIS — H35352 Cystoid macular degeneration, left eye: Secondary | ICD-10-CM | POA: Diagnosis not present

## 2018-05-15 DIAGNOSIS — H349 Unspecified retinal vascular occlusion: Secondary | ICD-10-CM | POA: Diagnosis not present

## 2018-05-18 ENCOUNTER — Ambulatory Visit (INDEPENDENT_AMBULATORY_CARE_PROVIDER_SITE_OTHER): Payer: Medicare Other

## 2018-05-18 DIAGNOSIS — Z23 Encounter for immunization: Secondary | ICD-10-CM

## 2018-05-18 NOTE — Progress Notes (Signed)
Per orders of Dr. Maudie Mercury, injection of Fluzone given by Franco Collet. Patient tolerated injection well.

## 2018-05-26 DIAGNOSIS — H43811 Vitreous degeneration, right eye: Secondary | ICD-10-CM | POA: Diagnosis not present

## 2018-05-26 DIAGNOSIS — H35042 Retinal micro-aneurysms, unspecified, left eye: Secondary | ICD-10-CM | POA: Diagnosis not present

## 2018-05-26 DIAGNOSIS — H34832 Tributary (branch) retinal vein occlusion, left eye, with macular edema: Secondary | ICD-10-CM | POA: Diagnosis not present

## 2018-05-26 DIAGNOSIS — H35352 Cystoid macular degeneration, left eye: Secondary | ICD-10-CM | POA: Diagnosis not present

## 2018-05-31 NOTE — Progress Notes (Signed)
HPI:  Using dictation device. Unfortunately this device frequently misinterprets words/phrases.  [name] is a pleasant 77 y.o. here for follow up. Chronic medical problems summarized below were reviewed for changes and stability and were updated as needed below. These issues and their treatment remain stable for the most part.  Reports doing well. Occasional anxiety with husband's health, but feels is coping ok. Due for bone density test. New complain of occasional Pain in Left lateral upper arm for about 1 year or so. Seems more when lies on this arm. Intermittent and mild. No weakness, numbness, persistent or severe pain. Denies CP, SOB, DOE, treatment intolerance or new symptoms. Due for labs.  AWV 10/04/17  HTN, Aortic atherosclerosis/HLD: -meds: norvasc, crestor started 2/19 -Prefers not to take cholesterol medications for  Depression and anxiety: -meds: SSRI - prozac  COPD, hx lung Ca: -sees pulmonology and onc for management  Osteopenia -on vit D  ROS: See pertinent positives and negatives per HPI.  Past Medical History:  Diagnosis Date  . Allergic rhinitis   . Anemia   . Arthritis    all over  . Bilateral cataracts   . Chicken pox   . Cholelithiasis 05/21/2014   On CT scan 2015   . COPD (chronic obstructive pulmonary disease) ([place])   . Depression    hx ocd and anxiety in record as well  . Gout    last time 6-21yrs ago   . History of blood transfusion   . Hot flashes    takes Prozac daily  . Hx of migraines 90's   after menopause HA stopped  . Hyperlipidemia    taking Flax Seed Oil and Fish Oil  . Hypertension    takes Amlodipine nightly  . IBS (irritable bowel syndrome)   . Liver cyst   . Lung cancer ([place])   . Macrocytosis   . Migraine    MIGRAINES RESOLVED WITH MENOPAUSE  . Osteopenia    takes Calcium and Vit D bid  . Pneumonia    x 2 ;last time back in 1999  . PONV (postoperative nausea and vomiting)   . S/P right THA, AA - Dr. Alvan Dame,  ortho 03/07/2012  . 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 Seasonal allergies   . Smoking 09/12/2011    Past Surgical History:  Procedure Laterality Date  . CARPAL TUNNEL RELEASE  80's   right   . COLONOSCOPY    . cyst removed  >70yrs ago   from left leg  . DILATION AND CURETTAGE OF UTERUS     at age 27  . EXCISIONAL TOTAL KNEE ARTHROPLASTY Right 06/03/2014   Procedure: RIGHT KNEE SCAR EXCISION;  Surgeon: [name], MD;  [facility]: [facility];  Service: Orthopedics;  Laterality: Right;  . INGUINAL HERNIA REPAIR  2000  . neuroplasty decompression median nerve at carpal tunnel    . ROTATOR CUFF REPAIR  2001   right  . TONSILLECTOMY  as a child   and adenoids  . TOTAL ABDOMINAL HYSTERECTOMY  2003  . TOTAL HIP ARTHROPLASTY  2011   left  . TOTAL HIP ARTHROPLASTY  03/07/2012   Procedure: TOTAL HIP ARTHROPLASTY ANTERIOR APPROACH;  Surgeon: [name], MD;  [facility];  Service: Orthopedics;  Laterality: Right;  . TOTAL KNEE ARTHROPLASTY Right 01/02/2013   Procedure: RIGHT TOTAL KNEE ARTHROPLASTY;  Surgeon: [name], MD;  [facility];  Service: Orthopedics;  Laterality: Right;  . TOTAL KNEE ARTHROPLASTY Left 04/03/2013   Procedure:  LEFT TOTAL KNEE ARTHROPLASTY;  Surgeon: [name], MD;  [facility];  Service: Orthopedics;  Laterality: Left;  Marland Kitchen VIDEO BRONCHOSCOPY  10/20/2011   Procedure: VIDEO BRONCHOSCOPY;  Surgeon: Grace Isaac, MD;  [facility];  Service: Thoracic;  Laterality: N/A;  . WEDGE RESECTION  10-20-2011   rt lung  - for lung cancer    Family History  Problem Relation Age of Onset  . Hypertension Brother   . Alcohol abuse Unknown   . Depression Unknown   . Hearing loss Unknown   . Heart disease Unknown   . Hypertension Unknown   . Anesthesia problems Neg Hx   . Hypotension Neg Hx   . Malignant hyperthermia Neg Hx   . Pseudochol deficiency Neg Hx     SOCIAL HX: see hpi   Current Outpatient Medications:  .  Acetaminophen (TYLENOL PO), Take  by mouth as needed. Alternates with Ibuprofen, Disp: , Rfl:  .  albuterol (PROAIR HFA) 108 (90 Base) MCG/ACT inhaler, INHALE TWO PUFFS BY MOUTH EVERY 6 HOURS AS NEEDED FOR WHEEZING OR SHORTNESS OF BREATH, Disp: 1 Inhaler, Rfl: 5 .  amLODipine (NORVASC) 5 MG tablet, Take 1 tablet (5 mg total) by mouth at bedtime., Disp: 90 tablet, Rfl: 3 .  ANORO ELLIPTA 62.5-25 MCG/INH AEPB, INHALE ONE PUFF INTO THE LUNGS ONCE DAILY, Disp: 60 each, Rfl: 5 .  ARNUITY ELLIPTA 100 MCG/ACT AEPB, INHALE 1 PUFF BY MOUTH ONCE DAILY, Disp: 30 each, Rfl: 5 .  Calcium-Magnesium-Zinc 500-250-12.5 MG TABS, Take 1 tablet by mouth daily., Disp: , Rfl:  .  Cholecalciferol (VITAMIN D-3 PO), Take 1,200 mg by mouth every morning., Disp: , Rfl:  .  Cyanocobalamin (VITAMIN B-12 PO), Take by mouth., Disp: , Rfl:  .  FLUoxetine (PROZAC) 20 MG capsule, Take 1 capsule (20 mg total) by mouth 2 (two) times daily., Disp: 180 capsule, Rfl: 3 .  fluticasone (FLONASE) 50 MCG/ACT nasal spray, Place 2 sprays into both nostrils daily., Disp: 16 g, Rfl: 5 .  Glucosamine 500 MG CAPS, Take 1,200 mg by mouth 2 (two) times daily., Disp: , Rfl:  .  GuaiFENesin (MUCINEX PO), Take by mouth as needed., Disp: , Rfl:  .  rosuvastatin (CRESTOR) 5 MG tablet, Take 1 tablet (5 mg total) by mouth daily., Disp: 90 tablet, Rfl: 3 .  sodium chloride (OCEAN) 0.65 % SOLN nasal spray, Place 1 spray into both nostrils daily as needed for congestion., Disp: , Rfl:  .  triamcinolone ointment (KENALOG) 0.5 %, Apply 1 application topically 2 (two) times daily., Disp: 30 g, Rfl: 0 .  umeclidinium-vilanterol (ANORO ELLIPTA) 62.5-25 MCG/INH AEPB, Inhale 1 puff into the lungs daily., Disp: 1 each, Rfl: 5 .  vitamin E 400 UNIT capsule, Take 400 Units by mouth daily., Disp: , Rfl:   EXAM:  Vitals:   06/01/18 1000  BP: 102/74  Pulse: 74  Temp: 98.2 F (36.8 C)    Body mass index is 24 kg/m.  GENERAL: vitals reviewed and listed above, alert, oriented, appears well  hydrated and in no acute distress  HEENT: atraumatic, conjunttiva clear, no obvious abnormalities on inspection of external nose and ears  NECK: no obvious masses on inspection  LUNGS: clear to auscultation bilaterally, no wheezes, rales or rhonchi, good air movement  CV: HRRR, no peripheral edema  MS: moves all extremities without noticeable abnormality, normal inspection and gross function both UEs, normal muscle strength throughout, neg empty can. Mild TTP at deltoid attach to humerus  L.   PSYCH: pleasant and cooperative, no obvious depression or anxiety  ASSESSMENT AND PLAN:  Discussed the following assessment and plan:  Essential hypertension - Plan: Basic metabolic panel, CBC  Atherosclerosis of aorta (HCC)  Hyperlipidemia, unspecified hyperlipidemia type  Moderate COPD (chronic obstructive pulmonary disease), Sees Dr. Chase Caller, Pulm  Anxiety and depression  Chronic left shoulder pain  -labs per orders -lifestyle recs -offered dexa -discussed potentially etiologies for the shoulder pain and eval - she declined further eval or imaging for now; advised her to let us know if she changes her mid -Patient advised to return or notify a doctor immediately if symptoms worsen or persist or new concerns arise.  Patient Instructions  BEFORE YOU LEAVE: -labs -due for dexa repeat if she is agreeable - please order -PHQ9 -follow up: AWV and follow up or CPE in February  We have ordered labs or studies at this visit. It can take up to 1-2 weeks for results and processing. IF results require follow up or explanation, we will call you with instructions. Clinically stable results will be released to your [facility]. If you have not heard from [place] or cannot find your results in [facility] in 2 weeks please contact our office at [phone].  If you are not yet signed up for [facility], please consider signing up.   We recommend the following healthy lifestyle for LIFE: 1) Small portions.  But, make sure to get regular (at least 3 per day), healthy meals and small healthy snacks if needed.  2) Eat a healthy clean diet.   TRY TO EAT: -at least 5-7 servings of low sugar, colorful, and nutrient rich vegetables per day (not corn, potatoes or bananas.) -berries are the best choice if you wish to eat fruit (only eat small amounts if trying to reduce weight)  -lean meets (fish, white meat of chicken or [place]) -vegan proteins for some meals - beans or tofu, whole grains, nuts and seeds -Replace bad fats with good fats - good fats include: fish, nuts and seeds, canola oil, olive oil -small amounts of low fat or non fat dairy -small amounts of100 % whole grains - check the lables -drink plenty of water  AVOID: -SUGAR, sweets, anything with added sugar, corn syrup or sweeteners - must read labels as even foods advertised as "healthy" often are loaded with sugar -if you must have a sweetener, small amounts of stevia may be best -sweetened beverages and artificially sweetened beverages -simple starches (rice, bread, potatoes, pasta, chips, etc - small amounts of 100% whole grains are ok) -red meat, pork, butter -fried foods, fast food, processed food, excessive dairy, eggs and coconut.  3)Get at least 150 minutes of sweaty aerobic exercise per week.  4)Reduce stress - consider counseling, meditation and relaxation to balance other aspects of your life.          [name], DO

## 2018-06-01 ENCOUNTER — Ambulatory Visit (INDEPENDENT_AMBULATORY_CARE_PROVIDER_SITE_OTHER): Payer: Medicare Other | Admitting: Family Medicine

## 2018-06-01 ENCOUNTER — Encounter: Payer: Self-pay | Admitting: Family Medicine

## 2018-06-01 VITALS — BP 102/74 | HR 74 | Temp 98.2°F | Ht 62.0 in | Wt 131.2 lb

## 2018-06-01 DIAGNOSIS — J449 Chronic obstructive pulmonary disease, unspecified: Secondary | ICD-10-CM | POA: Diagnosis not present

## 2018-06-01 DIAGNOSIS — E785 Hyperlipidemia, unspecified: Secondary | ICD-10-CM | POA: Diagnosis not present

## 2018-06-01 DIAGNOSIS — I700 Atherosclerosis of aorta: Secondary | ICD-10-CM

## 2018-06-01 DIAGNOSIS — I10 Essential (primary) hypertension: Secondary | ICD-10-CM

## 2018-06-01 DIAGNOSIS — F419 Anxiety disorder, unspecified: Secondary | ICD-10-CM

## 2018-06-01 DIAGNOSIS — F329 Major depressive disorder, single episode, unspecified: Secondary | ICD-10-CM

## 2018-06-01 DIAGNOSIS — M25512 Pain in left shoulder: Secondary | ICD-10-CM

## 2018-06-01 DIAGNOSIS — G8929 Other chronic pain: Secondary | ICD-10-CM

## 2018-06-01 DIAGNOSIS — F32A Depression, unspecified: Secondary | ICD-10-CM

## 2018-06-01 DIAGNOSIS — E2839 Other primary ovarian failure: Secondary | ICD-10-CM

## 2018-06-01 LAB — BASIC METABOLIC PANEL
BUN: 9 mg/dL (ref 6–23)
CHLORIDE: 100 meq/L (ref 96–112)
CO2: 27 mEq/L (ref 19–32)
Calcium: 9.9 mg/dL (ref 8.4–10.5)
Creatinine, Ser: 0.99 mg/dL (ref 0.40–1.20)
GFR: 58.12 mL/min — AB (ref >60.00)
Glucose, Bld: 96 mg/dL (ref 70–99)
POTASSIUM: 4.4 meq/L (ref 3.5–5.1)
SODIUM: 136 meq/L (ref 135–145)

## 2018-06-01 LAB — CBC
HCT: 44.6 % (ref 36.0–46.0)
HEMOGLOBIN: 15.4 g/dL — AB (ref 12.0–15.0)
MCHC: 34.6 g/dL (ref 30.0–36.0)
MCV: 96.6 fl (ref 78.0–100.0)
PLATELETS: 210 10*3/uL (ref 150.0–400.0)
RBC: 4.62 Mil/uL (ref 3.87–5.11)
RDW: 12.3 % (ref 11.5–15.5)
WBC: 6 10*3/uL (ref 4.0–10.5)

## 2018-06-01 NOTE — Patient Instructions (Signed)
BEFORE YOU LEAVE: -labs -due for dexa repeat if she is agreeable - please order -PHQ9 -follow up: AWV and follow up or CPE in February  We have ordered labs or studies at this visit. It can take up to 1-2 weeks for results and processing. IF results require follow up or explanation, we will call you with instructions. Clinically stable results will be released to your [facility]. If you have not heard from [place] or cannot find your results in [facility] in 2 weeks please contact our office at [phone].  If you are not yet signed up for [facility], please consider signing up.   We recommend the following healthy lifestyle for LIFE: 1) Small portions. But, make sure to get regular (at least 3 per day), healthy meals and small healthy snacks if needed.  2) Eat a healthy clean diet.   TRY TO EAT: -at least 5-7 servings of low sugar, colorful, and nutrient rich vegetables per day (not corn, potatoes or bananas.) -berries are the best choice if you wish to eat fruit (only eat small amounts if trying to reduce weight)  -lean meets (fish, white meat of chicken or [place]) -vegan proteins for some meals - beans or tofu, whole grains, nuts and seeds -Replace bad fats with good fats - good fats include: fish, nuts and seeds, canola oil, olive oil -small amounts of low fat or non fat dairy -small amounts of100 % whole grains - check the lables -drink plenty of water  AVOID: -SUGAR, sweets, anything with added sugar, corn syrup or sweeteners - must read labels as even foods advertised as "healthy" often are loaded with sugar -if you must have a sweetener, small amounts of stevia may be best -sweetened beverages and artificially sweetened beverages -simple starches (rice, bread, potatoes, pasta, chips, etc - small amounts of 100% whole grains are ok) -red meat, pork, butter -fried foods, fast food, processed food, excessive dairy, eggs and coconut.  3)Get at least 150 minutes of sweaty aerobic exercise per  week.  4)Reduce stress - consider counseling, meditation and relaxation to balance other aspects of your life.

## 2018-06-05 ENCOUNTER — Ambulatory Visit (INDEPENDENT_AMBULATORY_CARE_PROVIDER_SITE_OTHER)
Admission: RE | Admit: 2018-06-05 | Discharge: 2018-06-05 | Disposition: A | Discharge status: Home / Self Care | Payer: Medicare Other | Source: Ambulatory Visit | Attending: Family Medicine | Admitting: Family Medicine

## 2018-06-05 DIAGNOSIS — E2839 Other primary ovarian failure: Secondary | ICD-10-CM | POA: Diagnosis not present

## 2018-06-09 ENCOUNTER — Ambulatory Visit: Payer: Medicare Other | Admitting: Internal Medicine

## 2018-06-09 ENCOUNTER — Encounter: Payer: Self-pay | Admitting: Internal Medicine

## 2018-06-09 VITALS — BP 116/74 | HR 77 | Ht 62.0 in | Wt 131.8 lb

## 2018-06-09 DIAGNOSIS — Z85118 Personal history of other malignant neoplasm of bronchus and lung: Secondary | ICD-10-CM

## 2018-06-09 DIAGNOSIS — J449 Chronic obstructive pulmonary disease, unspecified: Secondary | ICD-10-CM

## 2018-06-09 DIAGNOSIS — Z08 Encounter for follow-up examination after completed treatment for malignant neoplasm: Secondary | ICD-10-CM

## 2018-06-09 DIAGNOSIS — J441 Chronic obstructive pulmonary disease with (acute) exacerbation: Secondary | ICD-10-CM

## 2018-06-09 NOTE — Progress Notes (Signed)
#COPD-MM genotype - 2013: Pre RUL lobectomy and Pre Rx: fev1 1.1L/60% , DLCO 9.6/56%, TLC 5.6/128%  - 2013 May: Post lobectomy and post Rx: fev1: 1.28L/60%, ratio 49 . No desat walk test - Rx spiriva, symbicort since spring 2013 - 2014 Nov: 06/22/2013. FEV1 is 1.3 L/62%. No post bronchodilator change. FVC is 2.6 L postop day later which is 95%. Ratio is 50. Total lung capacity is 4.96L/104%. DLCO is level 0.7/54%. - On synmbicort alone 04/05/2014 - Walk test today 185 feet x 3 laps on RA: Desat to < 88% at 2 laps - Change to [facility] plus Spiriva Respimat April 2016  # Stage 1 lung cancer: pT1b, pN0 Stage 08 October 2011 - s/p lobectomy by Dr Servando Snare - surveillance by Dr Earlie Server - 05/15/13 - Clear CT. No recurrence - October 2015 - clear CT,No recurrence  -Oct 2016 >no recurrence   09/11/2015 Follow up : COPD  Pt returns for 4 weeks follow up for COPD  PFT done today were reviewed with pt and showed  FEV 1 56%, ratio 49 , FVC 87%,  DLCO 43%  This is down slightly from 2014 (FEV1 62%)  CT chest in Oct 2016 did show mod emphysema .  She remains on Spiriva and BREO .  She has tried Symbicort and Dulera in past.  She says she does get winded with activity -but tried to stay active.  Has minimal cough . Sometimes has some wheezing and sinus drainge.  Denies chest pain, orthopnea, edema or hemoptysis .   Most recent CT chest Oct 2016 survelliance showed no cancer recurrence . Denies any chest pain, orthopnea, PND, leg swelling or hemoptysis.  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 She has questions regarding her prognosis .  Worried she will not be able to help him. "I need to out live him and be able to care for him"  She remains active with Swimming at [employer] and is part of the :"[employer]. "    OV 01/12/2016  Chief Complaint  Patient presents with  . Follow-up    Pt c/o some dry cough and SOB with exertion. Pt denies  wheeze/CP/tightness.    Moderate COPD lung cancer patient  Pulmonary function Feb 2017 showed Gold stage II COPD. CT scan of the chest October 2016 did not show any lung cancer recurrence. She is on triple inhaler therapy. She wants to switch to another version of triple inhaler therapy namely ANORO with and ICS. This is because of friend has had good experience with it. She continues her shortness of breath with exertion but is reluctant to try oxygen. We discussed nocturnal oxygen. She says she will consider the indigent system on her own. COPD cat score is 19. In the interim she has developed mild intention tremor with inhalers are not bothering her as much.   OV 06/08/2016  Chief Complaint  Patient presents with  . Follow-up    Pt state breathing is pretty good. Pt states congestion from allergiesThe [employer] work). Pt c/o no tightness in chest, SOB w/ excertion, Pt states some coughing because of dry air. loves the Anora but can not afford (doughnut hole)     She had CT scan of the chest 05/21/2016 and shows cancer in remission. No new infiltrates. She also had blood work 05/21/2016 with a normal creatinine of 0.8 mg percent and hemoglobin of 14.9 g percent. Overall COPD stable. She does not have any problems she is on triple inhaler  therapy which works well for her. She had baseline has exertional desaturations but is not using oxygen because of reluctance. She is planning to travel to [place] in [place] in 6 months and is inquiring about portable oxygen systems for her travel.   Walking desaturation test 185 feet 3 laps on room air 06/08/2016:Desaturated to 87% at the end of second lap   OV 03/02/2017  Chief Complaint  Patient presents with  . Follow-up    Pt states her breathing is unchanged since last OV. Pt c/o dry cough. Pt denies CP/tightness and f/c/s/.     Follow-up moderate COPD. Since last visit in October 2017 things are stable. She is on triple inhaler therapy. She canceled her  trip to [place]. He insists that she and her husband are going to [place] in [place] in September 2018. She has a follow-up scan for her lung cancer coming up in October 2018. This is from Dr. Julien Nordmann. She uses oxygen with exertion. She is started on the new shingles vaccine. Booster shot is due.  OV 08/31/2017  Chief Complaint  Patient presents with  . Follow-up    has occassional SOB,has non productive cough,uses inhaler anuity,anoro inhalers     Last visit July 2018.  There is a follow-up for moderate COPD.  She continues on triple inhaler therapy.  Since then her COPD CAT score is improved to 13.  Overall she is feeling fine without any exacerbations.  She has had to cancel her trip to [place] and [place] because of her husband's memory issues and back issues.  He is having back surgery in 2 weeks.  She plans to go to [place] and [place] and mid 2019.  She is up-to-date with her flu shot.  There are no main issues.  Earlier this year she had CT scan of the chest for lung cancer surveillance and there is no recurrence.  She has coronary artery calcification but she says she had a stress test in 2013 that was normal.  There is no active chest pain with exertion.      OV 06/09/2018  Subjective:  Patient ID: [name], female , DOB: October 09, 1940 , age 77 y.o. , MRN: [id number] , ADDRESS: [place] [address]   06/09/2018 -   Chief Complaint  Patient presents with  . Follow-up    9 month follow up for COPD. Per patient her breathing has been up and down since her last visit. Cold weather doesn't seem to bother her. Wants to discuss a possible chest vest. Is requesting a CT chest since she hasn't one in almost 2 years.       HPI MICCA MATURA 77 y.o. -female   Moderate COPD follow-up: Doing well.  She feels symptoms are baseline and well-controlled.  Takes Anoro daily.  Takes Arnuity at night but occasionally will not take it.  Is up-to-date with the flu shot and  pneumonia vaccines.  Lung cancer surveillance: Last CT scan of the chest October 2018 without tumor recurrence.  She somewhat thinks it has been 2 years since she had a previous scan.  She is asking for repeat scan.  No other new issues   CAT COPD Symptom & Quality of Life Score (GSK trademark) 0 is no burden. 5 is highest burden 01/12/2016  08/31/2017   Never Cough -> Cough all the time 2 2  No phlegm in chest -> Chest is full of phlegm 2 1  No chest tightness -> Chest feels very  tight 1 0  No dyspnea for 1 flight stairs/hill -> Very dyspneic for 1 flight of stairs 4 3  No limitations for ADL at home -> Very limited with ADL at home 2 3  Confident leaving home -> Not at all confident leaving home 1 0  Sleep soundly -> Do not sleep soundly because of lung condition 3 2  Lots of Energy -> No energy at all 4 2  TOTAL Score (max 40)  19 13    ROS - per HPI     has a past medical history of Allergic rhinitis, Anemia, Arthritis, Bilateral cataracts, Chicken pox, Cholelithiasis (05/21/2014), COPD (chronic obstructive pulmonary disease) ([place]), Depression, Gout, History of blood transfusion, Hot flashes, migraines (90's), Hyperlipidemia, Hypertension, IBS (irritable bowel syndrome), Liver cyst, Lung cancer ([place]), Macrocytosis, Migraine, Osteopenia, Pneumonia, PONV (postoperative nausea and vomiting), S/P right THA, AA - Dr. 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   reports that she quit smoking about 6 years ago. Her smoking use included cigarettes. She has a 25.00 pack-year smoking history. She has never used smokeless tobacco.  Past Surgical History:  Procedure Laterality Date  . CARPAL TUNNEL RELEASE  80's   right   . COLONOSCOPY    . cyst removed  >67yrs ago   from left leg  . DILATION AND CURETTAGE OF UTERUS     at age 75  . EXCISIONAL TOTAL KNEE ARTHROPLASTY Right 06/03/2014   Procedure: RIGHT KNEE SCAR EXCISION;  Surgeon:  [name], MD;  Location: [facility];  Service: Orthopedics;  Laterality: Right;  . INGUINAL HERNIA REPAIR  2000  . neuroplasty decompression median nerve at carpal tunnel    . ROTATOR CUFF REPAIR  2001   right  . TONSILLECTOMY  as a child   and adenoids  . TOTAL ABDOMINAL HYSTERECTOMY  2003  . TOTAL HIP ARTHROPLASTY  2011   left  . TOTAL HIP ARTHROPLASTY  03/07/2012   Procedure: TOTAL HIP ARTHROPLASTY ANTERIOR APPROACH;  Surgeon: [name], MD;  Location: [facility];  Service: Orthopedics;  Laterality: Right;  . TOTAL KNEE ARTHROPLASTY Right 01/02/2013   Procedure: RIGHT TOTAL KNEE ARTHROPLASTY;  Surgeon: [name], MD;  Location: [facility];  Service: Orthopedics;  Laterality: Right;  . TOTAL KNEE ARTHROPLASTY Left 04/03/2013   Procedure: LEFT TOTAL KNEE ARTHROPLASTY;  Surgeon: [name], MD;  Location: [facility];  Service: Orthopedics;  Laterality: Left;  Marland Kitchen VIDEO BRONCHOSCOPY  10/20/2011   Procedure: VIDEO BRONCHOSCOPY;  Surgeon: Grace Isaac, MD;  Location: [facility];  Service: Thoracic;  Laterality: N/A;  . WEDGE RESECTION  10-20-2011   rt lung  - for lung cancer    Allergies  Allergen Reactions  . Clindamycin/Lincomycin Nausea And Vomiting  . Penicillins Rash       . Spiriva [Tiotropium Bromide Monohydrate]     General intolerance  . Sulfa Antibiotics Hives and Rash    Severe Hives with skin reaction    Immunization History  Administered Date(s) Administered  . Influenza Split 06/02/2011, 05/09/2012  . Influenza, High Dose Seasonal PF 04/19/2016, 04/27/2017, 05/18/2018  . Influenza,inj,Quad PF,6+ Mos 05/18/2013, 04/17/2014, 05/21/2015  . Pneumococcal Conjugate-13 04/05/2014  . Pneumococcal Polysaccharide-23 08/09/1998, 09/21/2011  . Tdap 06/22/2012  . Zoster 08/10/2007    Family History  Problem Relation Age of Onset  . Hypertension Brother   . Alcohol abuse Unknown   . Depression Unknown   . Hearing loss Unknown   . Heart  disease Unknown   . Hypertension  Unknown   . Anesthesia problems Neg Hx   . Hypotension Neg Hx   . Malignant hyperthermia Neg Hx   . Pseudochol deficiency Neg Hx      Current Outpatient Medications:  .  Acetaminophen (TYLENOL PO), Take by mouth as needed. Alternates with Ibuprofen, Disp: , Rfl:  .  albuterol (PROAIR HFA) 108 (90 Base) MCG/ACT inhaler, INHALE TWO PUFFS BY MOUTH EVERY 6 HOURS AS NEEDED FOR WHEEZING OR SHORTNESS OF BREATH, Disp: 1 Inhaler, Rfl: 5 .  ANORO ELLIPTA 62.5-25 MCG/INH AEPB, INHALE ONE PUFF INTO THE LUNGS ONCE DAILY, Disp: 60 each, Rfl: 5 .  ARNUITY ELLIPTA 100 MCG/ACT AEPB, INHALE 1 PUFF BY MOUTH ONCE DAILY, Disp: 30 each, Rfl: 5 .  Calcium-Magnesium-Zinc 500-250-12.5 MG TABS, Take 1 tablet by mouth daily., Disp: , Rfl:  .  Cholecalciferol (VITAMIN D-3 PO), Take 1,200 mg by mouth every morning., Disp: , Rfl:  .  Cyanocobalamin (VITAMIN B-12 PO), Take by mouth., Disp: , Rfl:  .  FLUoxetine (PROZAC) 20 MG capsule, Take 1 capsule (20 mg total) by mouth 2 (two) times daily., Disp: 180 capsule, Rfl: 3 .  fluticasone (FLONASE) 50 MCG/ACT nasal spray, Place 2 sprays into both nostrils daily., Disp: 16 g, Rfl: 5 .  Glucosamine 500 MG CAPS, Take 1,200 mg by mouth 2 (two) times daily., Disp: , Rfl:  .  GuaiFENesin (MUCINEX PO), Take by mouth as needed., Disp: , Rfl:  .  rosuvastatin (CRESTOR) 5 MG tablet, Take 1 tablet (5 mg total) by mouth daily., Disp: 90 tablet, Rfl: 3 .  sodium chloride (OCEAN) 0.65 % SOLN nasal spray, Place 1 spray into both nostrils daily as needed for congestion., Disp: , Rfl:  .  triamcinolone ointment (KENALOG) 0.5 %, Apply 1 application topically 2 (two) times daily., Disp: 30 g, Rfl: 0 .  umeclidinium-vilanterol (ANORO ELLIPTA) 62.5-25 MCG/INH AEPB, Inhale 1 puff into the lungs daily., Disp: 1 each, Rfl: 5 .  vitamin E 400 UNIT capsule, Take 400 Units by mouth daily., Disp: , Rfl:       Objective:   Vitals:   06/09/18 0930  BP: 116/74  Pulse: 77  SpO2: 92%  Weight: 131 lb  12.8 oz (59.8 kg)  Height: 5\' 2"  (1.575 m)    Estimated body mass index is 24.11 kg/m as calculated from the following:   Height as of this encounter: 5\' 2"  (1.575 m).   Weight as of this encounter: 131 lb 12.8 oz (59.8 kg).  @WEIGHTCHANGE @  [employer]   06/09/18 0930  Weight: 131 lb 12.8 oz (59.8 kg)     Physical Exam  General Appearance:    Alert, cooperative, no distress, appears stated age - yes , Deconditioned looking - no , OBESE  - no, Sitting on Wheelchair -  no  Head:    Normocephalic, without obvious abnormality, atraumatic  Eyes:    PERRL, conjunctiva/corneas clear,  Ears:    Normal TM's and external ear canals, both ears  Nose:   Nares normal, septum midline, mucosa normal, no drainage    or sinus tenderness. OXYGEN ON  - no . Patient is @ ra   Throat:   Lips, mucosa, and tongue normal; teeth and gums normal. Cyanosis on lips - no  Neck:   Supple, symmetrical, trachea midline, no adenopathy;    thyroid:  no enlargement/tenderness/nodules; no carotid   bruit or JVD  Back:     Symmetric, no curvature, ROM normal,  no CVA tenderness  Lungs:     Distress - no , Wheeze no, Barrell Chest - no, Purse lip breathing - no, Crackles - no   Chest Wall:    No tenderness or deformity.    Heart:    Regular rate and rhythm, S1 and S2 normal, no rub   or gallop, Murmur - no  Breast Exam:    NOT DONE  Abdomen:     Soft, non-tender, bowel sounds active all four quadrants,    no masses, no organomegaly. Visceral obesity - no  Genitalia:   NOT DONE  Rectal:   NOT DONE  Extremities:   Extremities - normal, Has Cane - no, Clubbing - no, Edema - no  Pulses:   2+ and symmetric all extremities  Skin:   Stigmata of Connective Tissue Disease - no  Lymph nodes:   Cervical, supraclavicular, and axillary nodes normal  Psychiatric:  Neurologic:   Pleasant - yes, Anxious - no, Flat affect - no  CAm-ICU - neg, Alert and Oriented x 3 - yes, Moves all 4s - yes, Speech - normal, Cognition -  intact           Assessment:       ICD-10-CM   1. Moderate COPD (chronic obstructive pulmonary disease), Sees Dr. Chase Caller, Pulm J44.9   2. Encounter for follow-up surveillance of lung cancer Z08    Z85.118        Plan:     Patient Instructions  Moderate COPD (chronic obstructive pulmonary disease), Sees Dr. Chase Caller, [facility]  Encounter for follow-up surveillance of lung cancer   COPD -this is stable.  Glad you are up-to-date with the flu shot.  Continue regular inhalers including Anoro scheduled and Arnuity scheduled but understand you are taking Arnuity as needed.  Use albuterol as needed.  Lung cancer surveillance: Last CT scan was actually 1 year ago in October 2018.  Repeat CT scan of the chest without contrast.  We will call you with the results.  Follow-up 9 months or sooner for COPD; CAT score at follow-up     SIGNATURE    Dr. Brand Males, M.D., F.C.C.P,  Pulmonary and Critical Care Medicine Staff Physician, [place] Director - Interstitial Lung Disease  Program  Pulmonary [place] at [place], [place], [address]  Pager: [phone], If no answer or between  15:00h - 7:00h: call [phone] Telephone: [phone]  10:07 AM 06/09/2018

## 2018-06-09 NOTE — Addendum Note (Signed)
Addended by: Valerie Salts on: 06/09/2018 10:11 AM   Modules accepted: Orders

## 2018-06-09 NOTE — Patient Instructions (Signed)
Moderate COPD (chronic obstructive pulmonary disease), Sees Dr. Chase Caller, [facility]  Encounter for follow-up surveillance of lung cancer   COPD -this is stable.  Glad you are up-to-date with the flu shot.  Continue regular inhalers including Anoro scheduled and Arnuity scheduled but understand you are taking Arnuity as needed.  Use albuterol as needed.  Lung cancer surveillance: Last CT scan was actually 1 year ago in October 2018.  Repeat CT scan of the chest without contrast.  We will call you with the results.  Follow-up 9 months or sooner for COPD; CAT score at follow-up

## 2018-06-14 ENCOUNTER — Encounter: Payer: Self-pay | Admitting: Family Medicine

## 2018-06-14 DIAGNOSIS — M810 Age-related osteoporosis without current pathological fracture: Secondary | ICD-10-CM

## 2018-06-14 DIAGNOSIS — Z85118 Personal history of other malignant neoplasm of bronchus and lung: Secondary | ICD-10-CM | POA: Insufficient documentation

## 2018-06-14 DIAGNOSIS — M8580 Other specified disorders of bone density and structure, unspecified site: Secondary | ICD-10-CM | POA: Insufficient documentation

## 2018-06-19 ENCOUNTER — Ambulatory Visit (INDEPENDENT_AMBULATORY_CARE_PROVIDER_SITE_OTHER)
Admission: RE | Admit: 2018-06-19 | Discharge: 2018-06-19 | Disposition: A | Discharge status: Home / Self Care | Payer: Medicare Other | Source: Ambulatory Visit | Attending: Internal Medicine | Admitting: Internal Medicine

## 2018-06-19 DIAGNOSIS — J441 Chronic obstructive pulmonary disease with (acute) exacerbation: Secondary | ICD-10-CM | POA: Diagnosis not present

## 2018-06-19 DIAGNOSIS — C3490 Malignant neoplasm of unspecified part of unspecified bronchus or lung: Secondary | ICD-10-CM | POA: Diagnosis not present

## 2018-06-23 ENCOUNTER — Telehealth: Payer: Self-pay | Admitting: Internal Medicine

## 2018-06-23 NOTE — Telephone Encounter (Signed)
Attempted to call pt but unable to reach her. Left message for pt to return call. 

## 2018-06-23 NOTE — Telephone Encounter (Signed)
CT chest -> no evidence of cancer . However,   1. Asymptomatic gallstones + -> talk to PCP [name], DO  2.  does have coronary artery calcification and if no normal cardiac stress test past few years (and I do not see evidence of that in epic); please refer to cardiologist - Fisher or Dr Einar Gip, first available    IMPRESSION: 1. No evidence of recurrent or metastatic disease. 2. Cholelithiasis. 3. Aortic atherosclerosis (ICD10-170.0). Three-vessel coronary artery calcification. 4. Enlarged pulmonary arteries, indicative of pulmonary arterial hypertension. 5.  Emphysema (ICD10-J43.9).   Electronically Signed   By: Lorin Picket M.D.   On: 06/19/2018 11:34

## 2018-06-26 NOTE — Telephone Encounter (Signed)
Patient returned call. She said she saw her results on MyChart and has no questions.  If need to reach her, CB is [phone].

## 2018-06-26 NOTE — Telephone Encounter (Signed)
Will close this encounter.  

## 2018-06-30 DIAGNOSIS — H35042 Retinal micro-aneurysms, unspecified, left eye: Secondary | ICD-10-CM | POA: Diagnosis not present

## 2018-06-30 DIAGNOSIS — H34832 Tributary (branch) retinal vein occlusion, left eye, with macular edema: Secondary | ICD-10-CM | POA: Diagnosis not present

## 2018-06-30 DIAGNOSIS — H35352 Cystoid macular degeneration, left eye: Secondary | ICD-10-CM | POA: Diagnosis not present

## 2018-06-30 DIAGNOSIS — H3562 Retinal hemorrhage, left eye: Secondary | ICD-10-CM | POA: Diagnosis not present

## 2018-06-30 NOTE — Progress Notes (Signed)
HPI:  Using dictation device. Unfortunately this device frequently misinterprets words/phrases.  Acute visit for follow up imaging:  Osteopenia: -on dexa -reviewed results -taking vit d and double dose calcium -does pool exercise  gallstones: -incidental finding on imaging done with pulmonology -she was advised to see me about this -asymptomatic - reports has had this for year and no surgery advised and prefers to not do anything about  Also with CAD on imaging and her pulmonologist advised she see cardiologist. On statin. She has cardiologist, had stress test prior to lung surgery per her report and will call him about this. Denies CP, angina, symptoms.  ROS: See pertinent positives and negatives per HPI.  Past Medical History:  Diagnosis Date  . Allergic rhinitis   . Anemia   . Arthritis    all over  . Bilateral cataracts   . Bronchogenic lung cancer ([place]) 09/12/2011    # Stage 1 lung cancer: pT1b, pN0 Stage 08 October 2011 - s/p lobectomy by Dr Servando Snare - surveillance by Dr Earlie Server - 05/15/13 - Clear CT. No recurrence - October 2015 - clear CT,No recurrence             -Oct 2016 >no recurrence   . Chicken pox   . Cholelithiasis 05/21/2014   On CT scan 2015   . COPD (chronic obstructive pulmonary disease) ([place])   . Depression    hx ocd and anxiety in record as well  . Gout    last time 6-45yrs ago   . History of blood transfusion   . Hot flashes    takes Prozac daily  . Hx of migraines 90's   after menopause HA stopped  . Hyperlipidemia    taking Flax Seed Oil and Fish Oil  . Hypertension    takes Amlodipine nightly  . IBS (irritable bowel syndrome)   . Liver cyst   . Lung cancer ([place])   . Macrocytosis   . Migraine    MIGRAINES RESOLVED WITH MENOPAUSE  . Osteopenia    takes Calcium and Vit D bid  . Pneumonia    x 2 ;last time back in 1999  . PONV (postoperative nausea and vomiting)   . S/P right THA, AA - Dr.  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 Seasonal allergies   . Smoking 09/12/2011    Past Surgical History:  Procedure Laterality Date  . CARPAL TUNNEL RELEASE  80's   right   . COLONOSCOPY    . cyst removed  >21yrs ago   from left leg  . DILATION AND CURETTAGE OF UTERUS     at age 60  . EXCISIONAL TOTAL KNEE ARTHROPLASTY Right 06/03/2014   Procedure: RIGHT KNEE SCAR EXCISION;  Surgeon: [name], MD;  Location: [facility];  Service: Orthopedics;  Laterality: Right;  . INGUINAL HERNIA REPAIR  2000  . neuroplasty decompression median nerve at carpal tunnel    . ROTATOR CUFF REPAIR  2001   right  . TONSILLECTOMY  as a child   and adenoids  . TOTAL ABDOMINAL HYSTERECTOMY  2003  . TOTAL HIP ARTHROPLASTY  2011   left  . TOTAL HIP ARTHROPLASTY  03/07/2012   Procedure: TOTAL HIP ARTHROPLASTY ANTERIOR APPROACH;  Surgeon: [name], MD;  Location: [facility];  Service: Orthopedics;  Laterality: Right;  . TOTAL KNEE ARTHROPLASTY Right 01/02/2013   Procedure: RIGHT TOTAL KNEE ARTHROPLASTY;  Surgeon: [name], MD;  [facility]: [facility];  Service: Orthopedics;  Laterality: Right;  . TOTAL KNEE ARTHROPLASTY Left 04/03/2013   Procedure: LEFT TOTAL KNEE ARTHROPLASTY;  Surgeon: [name], MD;  [facility];  Service: Orthopedics;  Laterality: Left;  Marland Kitchen VIDEO BRONCHOSCOPY  10/20/2011   Procedure: VIDEO BRONCHOSCOPY;  Surgeon: Grace Isaac, MD;  [facility];  Service: Thoracic;  Laterality: N/A;  . WEDGE RESECTION  10-20-2011   rt lung  - for lung cancer    Family History  Problem Relation Age of Onset  . Hypertension Brother   . Alcohol abuse Unknown   . Depression Unknown   . Hearing loss Unknown   . Heart disease Unknown   . Hypertension Unknown   . Anesthesia problems Neg Hx   . Hypotension Neg Hx   . Malignant hyperthermia Neg Hx   . Pseudochol deficiency Neg Hx     SOCIAL HX: se ehpi   Current Outpatient Medications:  .  Acetaminophen (TYLENOL PO),  Take by mouth as needed. Alternates with Ibuprofen, Disp: , Rfl:  .  albuterol (PROAIR HFA) 108 (90 Base) MCG/ACT inhaler, INHALE TWO PUFFS BY MOUTH EVERY 6 HOURS AS NEEDED FOR WHEEZING OR SHORTNESS OF BREATH, Disp: 1 Inhaler, Rfl: 5 .  ANORO ELLIPTA 62.5-25 MCG/INH AEPB, INHALE ONE PUFF INTO THE LUNGS ONCE DAILY, Disp: 60 each, Rfl: 5 .  ARNUITY ELLIPTA 100 MCG/ACT AEPB, INHALE 1 PUFF BY MOUTH ONCE DAILY, Disp: 30 each, Rfl: 5 .  Calcium-Magnesium-Zinc 500-250-12.5 MG TABS, Take 1 tablet by mouth daily., Disp: , Rfl:  .  Cholecalciferol (VITAMIN D-3 PO), Take 1,200 mg by mouth every morning., Disp: , Rfl:  .  Cyanocobalamin (VITAMIN B-12 PO), Take by mouth., Disp: , Rfl:  .  FLUoxetine (PROZAC) 20 MG capsule, Take 1 capsule (20 mg total) by mouth 2 (two) times daily., Disp: 180 capsule, Rfl: 3 .  fluticasone (FLONASE) 50 MCG/ACT nasal spray, Place 2 sprays into both nostrils daily., Disp: 16 g, Rfl: 5 .  Glucosamine 500 MG CAPS, Take 1,200 mg by mouth 2 (two) times daily., Disp: , Rfl:  .  GuaiFENesin (MUCINEX PO), Take by mouth as needed., Disp: , Rfl:  .  rosuvastatin (CRESTOR) 5 MG tablet, Take 1 tablet (5 mg total) by mouth daily., Disp: 90 tablet, Rfl: 3 .  sodium chloride (OCEAN) 0.65 % SOLN nasal spray, Place 1 spray into both nostrils daily as needed for congestion., Disp: , Rfl:  .  triamcinolone ointment (KENALOG) 0.5 %, Apply 1 application topically 2 (two) times daily., Disp: 30 g, Rfl: 0 .  vitamin E 400 UNIT capsule, Take 400 Units by mouth daily., Disp: , Rfl:   EXAM:  Vitals:   07/03/18 1134  BP: 110/80  Pulse: 68  Temp: 97.9 F (36.6 C)    Body mass index is 24.36 kg/m.  GENERAL: vitals reviewed and listed above, alert, oriented, appears well hydrated and in no acute distress  HEENT: atraumatic, conjunttiva clear, no obvious abnormalities on inspection of external nose and ears  NECK: no obvious masses on inspection  LUNGS: clear to auscultation bilaterally, no  wheezes, rales or rhonchi, good air movement  CV: HRRR, no peripheral edema  MS: moves all extremities without noticeable abnormality  PSYCH: pleasant and cooperative, no obvious depression or anxiety  ASSESSMENT AND PLAN:  Discussed the following assessment and plan:  Osteopenia, unspecified location  Gallstones  Atherosclerosis of aorta (HCC)  Coronary artery disease involving native heart without angina pectoris, unspecified vessel or lesion type  -reviewed dexa results,  implications, treatment --> she has decided to do vit d and weight bearing exercise, advised recheck in 2 years, enough calcium in diet so advised to stop calcium -she does not want to do further eval or tx for asymptomatic gallstones, discussed implications -she plans to call her cardiologist about the incidental arterial dz, on statin, asymptomatic, she reports normal stress test in the past -follow up as scheduled  There are no Patient Instructions on file for this visit.  [name], DO

## 2018-07-03 ENCOUNTER — Ambulatory Visit (INDEPENDENT_AMBULATORY_CARE_PROVIDER_SITE_OTHER): Payer: Medicare Other | Admitting: Family Medicine

## 2018-07-03 ENCOUNTER — Encounter: Payer: Self-pay | Admitting: Family Medicine

## 2018-07-03 VITALS — BP 110/80 | HR 68 | Temp 97.9°F | Ht 62.0 in | Wt 133.2 lb

## 2018-07-03 DIAGNOSIS — M8580 Other specified disorders of bone density and structure, unspecified site: Secondary | ICD-10-CM | POA: Diagnosis not present

## 2018-07-03 DIAGNOSIS — I2510 Atherosclerotic heart disease of native coronary artery without angina pectoris: Secondary | ICD-10-CM | POA: Diagnosis not present

## 2018-07-03 DIAGNOSIS — I700 Atherosclerosis of aorta: Secondary | ICD-10-CM

## 2018-07-03 DIAGNOSIS — K8020 Calculus of gallbladder without cholecystitis without obstruction: Secondary | ICD-10-CM | POA: Diagnosis not present

## 2018-07-17 NOTE — Addendum Note (Signed)
Addended by: [name] on: 07/17/2018 10:38 AM   Modules accepted: Orders

## 2018-08-08 DIAGNOSIS — H3562 Retinal hemorrhage, left eye: Secondary | ICD-10-CM | POA: Diagnosis not present

## 2018-08-08 DIAGNOSIS — H35352 Cystoid macular degeneration, left eye: Secondary | ICD-10-CM | POA: Diagnosis not present

## 2018-08-08 DIAGNOSIS — H34832 Tributary (branch) retinal vein occlusion, left eye, with macular edema: Secondary | ICD-10-CM | POA: Diagnosis not present

## 2018-08-13 ENCOUNTER — Other Ambulatory Visit: Payer: Self-pay | Admitting: Internal Medicine

## 2018-08-13 ENCOUNTER — Other Ambulatory Visit: Payer: Self-pay | Admitting: Family Medicine

## 2018-08-18 ENCOUNTER — Encounter: Payer: Self-pay | Admitting: Cardiovascular Disease

## 2018-08-18 ENCOUNTER — Ambulatory Visit: Payer: Medicare Other | Admitting: Cardiovascular Disease

## 2018-08-18 DIAGNOSIS — I2510 Atherosclerotic heart disease of native coronary artery without angina pectoris: Secondary | ICD-10-CM | POA: Diagnosis not present

## 2018-08-18 DIAGNOSIS — I10 Essential (primary) hypertension: Secondary | ICD-10-CM

## 2018-08-18 DIAGNOSIS — E782 Mixed hyperlipidemia: Secondary | ICD-10-CM | POA: Diagnosis not present

## 2018-08-18 DIAGNOSIS — E785 Hyperlipidemia, unspecified: Secondary | ICD-10-CM | POA: Insufficient documentation

## 2018-08-18 NOTE — Patient Instructions (Addendum)
Medication Instructions:  NONE If you need a refill on your cardiac medications before your next appointment, please call your pharmacy.   Lab work: NONE If you have labs (blood work) drawn today and your tests are completely normal, you will receive your results only by: Marland Kitchen MyChart Message (if you have MyChart) OR . A paper copy in the mail If you have any lab test that is abnormal or we need to change your treatment, we will call you to review the results.  Testing/Procedures: Your physician has requested that you have a lexiscan myoview. For further information please visit [email or web address]. Please follow instruction sheet, as given.    Follow-Up: At [facility], you and your health needs are our priority.  As part of our continuing mission to provide you with exceptional heart care, we have created designated Provider Care Teams.  These Care Teams include your primary Cardiologist (physician) and Advanced Practice Providers (APPs -  Physician Assistants and Nurse Practitioners) who all work together to provide you with the care you need, when you need it. Marland Kitchen You may schedule a follow up appointment as needed unless your results are abnormal. You may see Dr. Gwenlyn Found or one of the following Advanced Practice Providers on your designated Care Team:   . Kerin Ransom, [place] . Almyra Deforest, PA-C . Fabian Sharp, PA-C . Jory Sims, DNP . Rosaria Ferries, PA-C . Roby Lofts, PA-C . Sande Rives, PA-C

## 2018-08-18 NOTE — Progress Notes (Signed)
08/18/2018 [name]   08/18/1940  [id number]  Primary Physician [name], DO Primary Cardiologist: Lorretta Harp MD Lupe Carney, [place]  HPI:  CAITLYNE INGHAM is a 78 y.o. 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  She was referred by Dr. Maudie Mercury for cardiovascular valuation because of coronary calcification on chest CT performed 06/19/2018.  Risk factors include 50-100-pack-year tobacco abuse having quit 67 years ago at the time of her partial lobectomy by Dr. Servando Snare for lung cancer.  She had a stress test prior to that that was negative.  She does have COPD.  She drinks 1-2 beers a day.  There is no family history for heart disease.  She is never had a heart attack or stroke.  She does complain of chronic shortness of breath probably related to her COPD but denies chest pain.  She is had bilateral total knee and hip replacements as well as a shoulder replacement.   Current Meds  Medication Sig  . Acetaminophen (TYLENOL PO) Take by mouth as needed. Alternates with Ibuprofen  . albuterol (PROAIR HFA) 108 (90 Base) MCG/ACT inhaler INHALE TWO PUFFS BY MOUTH EVERY 6 HOURS AS NEEDED FOR WHEEZING OR SHORTNESS OF BREATH  . ANORO ELLIPTA 62.5-25 MCG/INH AEPB INHALE 1 PUFF BY MOUTH ONCE DAILY  . ARNUITY ELLIPTA 100 MCG/ACT AEPB INHALE 1 PUFF BY MOUTH ONCE DAILY  . Cholecalciferol (VITAMIN D-3 PO) Take 1,200 mg by mouth every morning.  . Cyanocobalamin (VITAMIN B-12 PO) Take by mouth.  Marland Kitchen FLUoxetine (PROZAC) 20 MG capsule TAKE 1 CAPSULE BY MOUTH TWICE DAILY  . fluticasone (FLONASE) 50 MCG/ACT nasal spray Place 2 sprays into both nostrils daily.  . Glucosamine 500 MG CAPS Take 1,200 mg by mouth 2 (two) times daily.  . GuaiFENesin (MUCINEX PO) Take by mouth as needed.  . rosuvastatin (CRESTOR) 5 MG tablet Take 1 tablet (5 mg total) by mouth daily.  . sodium chloride (OCEAN) 0.65 %  SOLN nasal spray Place 1 spray into both nostrils daily as needed for congestion.  . triamcinolone ointment (KENALOG) 0.5 % Apply 1 application topically 2 (two) times daily.  . vitamin E 400 UNIT capsule Take 400 Units by mouth daily.     Allergies  Allergen Reactions  . Clindamycin/Lincomycin Nausea And Vomiting  . Penicillins Rash       . Spiriva [Tiotropium Bromide Monohydrate]     General intolerance  . Sulfa Antibiotics Hives and Rash    Severe Hives with skin reaction    Social History   Socioeconomic History  . Marital status: Married    Spouse name: Not on file  . Number of children: Not on file  . Years of education: Not on file  . Highest education level: Not on file  Occupational History  . Occupation: retired  Scientific laboratory technician  . Financial resource strain: Not on file  . Food insecurity:    Worry: Not on file    Inability: Not on file  . Transportation needs:    Medical: Not on file    Non-medical: Not on file  Tobacco Use  . Smoking status: Former Smoker    Packs/day: 0.50    Years: 50.00    Pack years: 25.00    Types: Cigarettes    Last attempt to quit: 09/23/2011    Years since quitting: 6.9  . Smokeless tobacco: Never  Used  Substance and Sexual Activity  . Alcohol use: Yes    Comment: 2 beers every night  . Drug use: No  . Sexual activity: Never  Lifestyle  . Physical activity:    Days per week: Not on file    Minutes per session: Not on file  . Stress: Not on file  Relationships  . Social connections:    Talks on phone: Not on file    Gets together: Not on file    Attends religious service: Not on file    Active member of club or organization: Not on file    Attends meetings of clubs or organizations: Not on file    Relationship status: Not on file  . Intimate partner violence:    Fear of current or ex partner: Not on file    Emotionally abused: Not on file    Physically abused: Not on file    Forced sexual activity: Not on file  Other  Topics Concern  . Not on file  Social History Narrative  . Not on file     Review of Systems: General: negative for chills, fever, night sweats or weight changes.  Cardiovascular: negative for chest pain, dyspnea on exertion, edema, orthopnea, palpitations, paroxysmal nocturnal dyspnea or shortness of breath Dermatological: negative for rash Respiratory: negative for cough or wheezing Urologic: negative for hematuria Abdominal: negative for nausea, vomiting, diarrhea, bright red blood per rectum, melena, or hematemesis Neurologic: negative for visual changes, syncope, or dizziness All other systems reviewed and are otherwise negative except as noted above.    Blood pressure 129/87, pulse 66, height 5\' 2"  (1.575 m), weight 130 lb 3.2 oz (59.1 kg).  General appearance: alert and no distress Neck: no adenopathy, no carotid bruit, no JVD, supple, symmetrical, trachea midline and thyroid not enlarged, symmetric, no tenderness/mass/nodules Lungs: clear to auscultation bilaterally Heart: regular rate and rhythm, S1, S2 normal, no murmur, click, rub or gallop Extremities: extremities normal, atraumatic, no cyanosis or edema Pulses: 2+ and symmetric Skin: Skin color, texture, turgor normal. No rashes or lesions Neurologic: Alert and oriented X 3, normal strength and tone. Normal symmetric reflexes. Normal coordination and gait  EKG normal sinus rhythm at 66 low limb voltage and septal Q waves.  Personally reviewed this EKG.  ASSESSMENT AND PLAN:   Hypertension History of essential hypertension with blood pressure measured today at 129/87.  She is on amlodipine.  Continue current meds at current dosing  Hyperlipidemia She of hyperlipidemia on statin therapy with lipid profile performed 01/30/2018 revealing a total cholesterol of 152, LDL 65 and HDL of 70.  Coronary artery calcification seen on CT scan Ms. Cabell had a chest CT performed 06/19/2018 showed coronary calcification in all 3  coronary arteries.  She does have positive risk factors including hypertension, hyperlipidemia and long history of tobacco abuse.  I am going to get a pharmacologic Myoview stress test to further evaluate.  She cannot exercise because of prior total knee replacements.      Lorretta Harp MD FACP,FACC,FAHA, [facility] 08/18/2018 12:45 PM

## 2018-08-18 NOTE — Assessment & Plan Note (Signed)
[name] had a chest CT performed 06/19/2018 showed coronary calcification in all 3 coronary arteries.  She does have positive risk factors including hypertension, hyperlipidemia and long history of tobacco abuse.  I am going to get a pharmacologic Myoview stress test to further evaluate.  She cannot exercise because of prior total knee replacements.

## 2018-08-18 NOTE — Assessment & Plan Note (Signed)
History of essential hypertension with blood pressure measured today at 129/87.  She is on amlodipine.  Continue current meds at current dosing

## 2018-08-18 NOTE — Assessment & Plan Note (Signed)
She of hyperlipidemia on statin therapy with lipid profile performed 01/30/2018 revealing a total cholesterol of 152, LDL 65 and HDL of 70.

## 2018-08-18 NOTE — Addendum Note (Signed)
Addended by: Jacqulynn Cadet on: 08/18/2018 05:41 PM   Modules accepted: Orders

## 2018-08-23 ENCOUNTER — Telehealth (HOSPITAL_COMMUNITY): Payer: Self-pay

## 2018-08-23 NOTE — Telephone Encounter (Signed)
Encounter complete. 

## 2018-08-25 ENCOUNTER — Ambulatory Visit (HOSPITAL_COMMUNITY)
Admission: RE | Admit: 2018-08-25 | Discharge: 2018-08-25 | Disposition: A | Discharge status: Home / Self Care | Payer: Medicare Other | Source: Ambulatory Visit | Attending: Internal Medicine | Admitting: Internal Medicine

## 2018-08-25 DIAGNOSIS — I2510 Atherosclerotic heart disease of native coronary artery without angina pectoris: Secondary | ICD-10-CM | POA: Insufficient documentation

## 2018-08-25 LAB — MYOCARDIAL PERFUSION IMAGING
CHL CUP NUCLEAR SRS: 1
LV dias vol: 61 mL (ref 46–106)
LV sys vol: 19 mL
Peak HR: 83 {beats}/min
Rest HR: 61 {beats}/min
SDS: 0
SSS: 1
TID: 1.17

## 2018-08-25 MED ORDER — REGADENOSON 0.4 MG/5ML IV SOLN
0.4000 mg | Freq: Once | INTRAVENOUS | Status: AC
  Administered 2018-08-25: 0.4 mg via INTRAVENOUS

## 2018-08-25 MED ORDER — TECHNETIUM TC 99M TETROFOSMIN IV KIT
31.6000 | PACK | Freq: Once | INTRAVENOUS | Status: AC | PRN
  Administered 2018-08-25: 31.6 via INTRAVENOUS
  Filled 2018-08-25: qty 32

## 2018-08-25 MED ORDER — TECHNETIUM TC 99M TETROFOSMIN IV KIT
10.6000 | PACK | Freq: Once | INTRAVENOUS | Status: AC | PRN
  Administered 2018-08-25: 10.6 via INTRAVENOUS
  Filled 2018-08-25: qty 11

## 2018-08-27 ENCOUNTER — Other Ambulatory Visit: Payer: Self-pay | Admitting: Family Medicine

## 2018-09-24 ENCOUNTER — Other Ambulatory Visit: Payer: Self-pay | Admitting: Family Medicine

## 2018-10-02 ENCOUNTER — Encounter: Payer: Medicare Other | Admitting: Family Medicine

## 2018-10-03 DIAGNOSIS — R52 Pain, unspecified: Secondary | ICD-10-CM | POA: Diagnosis not present

## 2018-10-03 DIAGNOSIS — J189 Pneumonia, unspecified organism: Secondary | ICD-10-CM | POA: Diagnosis not present

## 2018-10-03 DIAGNOSIS — R05 Cough: Secondary | ICD-10-CM | POA: Diagnosis not present

## 2018-10-05 ENCOUNTER — Ambulatory Visit: Payer: Medicare Other

## 2018-10-05 ENCOUNTER — Encounter: Payer: Medicare Other | Admitting: Family Medicine

## 2018-10-09 DIAGNOSIS — J189 Pneumonia, unspecified organism: Secondary | ICD-10-CM | POA: Diagnosis not present

## 2018-10-18 DIAGNOSIS — H3562 Retinal hemorrhage, left eye: Secondary | ICD-10-CM | POA: Diagnosis not present

## 2018-10-18 DIAGNOSIS — H35042 Retinal micro-aneurysms, unspecified, left eye: Secondary | ICD-10-CM | POA: Diagnosis not present

## 2018-10-18 DIAGNOSIS — H43811 Vitreous degeneration, right eye: Secondary | ICD-10-CM | POA: Diagnosis not present

## 2018-10-18 DIAGNOSIS — H35352 Cystoid macular degeneration, left eye: Secondary | ICD-10-CM | POA: Diagnosis not present

## 2018-10-18 DIAGNOSIS — H34832 Tributary (branch) retinal vein occlusion, left eye, with macular edema: Secondary | ICD-10-CM | POA: Diagnosis not present

## 2018-10-24 ENCOUNTER — Ambulatory Visit: Payer: Medicare Other

## 2018-10-24 ENCOUNTER — Encounter: Payer: Medicare Other | Admitting: Family Medicine

## 2018-10-26 ENCOUNTER — Other Ambulatory Visit: Payer: Self-pay | Admitting: Internal Medicine

## 2018-10-31 ENCOUNTER — Telehealth: Payer: Self-pay

## 2018-10-31 NOTE — Telephone Encounter (Signed)
Author phoned pt. to offer to do awv virtually, as well as reschedule CPE. Pt. Stated that she has a computer, but does not feel comfortable using, and would prefer to wait until summertime for both appointments. Author notified pt. Of Dr. Julianne Rice retirement, and [facility] appointment with Dr. Ethlyn Gallery set up per pt. Request for 7/6. AWV scheduled for 7/13.

## 2018-11-07 ENCOUNTER — Encounter: Payer: Medicare Other | Admitting: Family Medicine

## 2018-11-07 ENCOUNTER — Ambulatory Visit: Payer: Medicare Other

## 2018-12-13 DIAGNOSIS — H43811 Vitreous degeneration, right eye: Secondary | ICD-10-CM | POA: Diagnosis not present

## 2018-12-13 DIAGNOSIS — H43812 Vitreous degeneration, left eye: Secondary | ICD-10-CM | POA: Diagnosis not present

## 2018-12-13 DIAGNOSIS — H35042 Retinal micro-aneurysms, unspecified, left eye: Secondary | ICD-10-CM | POA: Diagnosis not present

## 2018-12-13 DIAGNOSIS — H34832 Tributary (branch) retinal vein occlusion, left eye, with macular edema: Secondary | ICD-10-CM | POA: Diagnosis not present

## 2018-12-13 DIAGNOSIS — H35352 Cystoid macular degeneration, left eye: Secondary | ICD-10-CM | POA: Diagnosis not present

## 2018-12-21 ENCOUNTER — Other Ambulatory Visit: Payer: Self-pay | Admitting: Internal Medicine

## 2019-01-04 ENCOUNTER — Other Ambulatory Visit: Payer: Self-pay | Admitting: Internal Medicine

## 2019-02-12 ENCOUNTER — Ambulatory Visit (INDEPENDENT_AMBULATORY_CARE_PROVIDER_SITE_OTHER): Payer: Medicare Other | Admitting: Family Medicine

## 2019-02-12 ENCOUNTER — Other Ambulatory Visit: Payer: Self-pay

## 2019-02-12 ENCOUNTER — Encounter: Payer: Self-pay | Admitting: Family Medicine

## 2019-02-12 DIAGNOSIS — M810 Age-related osteoporosis without current pathological fracture: Secondary | ICD-10-CM

## 2019-02-12 DIAGNOSIS — I700 Atherosclerosis of aorta: Secondary | ICD-10-CM | POA: Diagnosis not present

## 2019-02-12 DIAGNOSIS — K8020 Calculus of gallbladder without cholecystitis without obstruction: Secondary | ICD-10-CM

## 2019-02-12 DIAGNOSIS — J449 Chronic obstructive pulmonary disease, unspecified: Secondary | ICD-10-CM | POA: Diagnosis not present

## 2019-02-12 DIAGNOSIS — I10 Essential (primary) hypertension: Secondary | ICD-10-CM | POA: Diagnosis not present

## 2019-02-12 DIAGNOSIS — Z1211 Encounter for screening for malignant neoplasm of colon: Secondary | ICD-10-CM

## 2019-02-12 DIAGNOSIS — E782 Mixed hyperlipidemia: Secondary | ICD-10-CM | POA: Diagnosis not present

## 2019-02-12 NOTE — Progress Notes (Signed)
Virtual Visit via Video Note  I connected with [name]   on 02/12/19 at  9:00 AM EDT by a video enabled telemedicine application and verified that I am speaking with the correct person using two identifiers.  Location patient: home Location provider:work office Persons participating in the virtual visit: patient, provider  I discussed the limitations of evaluation and management by telemedicine and the availability of in person appointments. The patient expressed understanding and agreed to proceed.  Converted to phone visit/lack video  [name] DOB: [birth year] Encounter date: 02/12/2019  This is a 78 y.o. female who presents to establish care. Chief Complaint  Patient presents with  . Establish Care    also requests Cologuard test   Last visit w [facility] was 06/2018; last bloodwork 05/2018  History of present illness: QIO:NGEXBMW 5mg ; follows with cardiology had myocardial perfusion imaging 08/2018 which was stable with hyperdynamic EF >65%. Doesn't check blood pressure at home.  HL: crestor 5mg . Does ok with this medication. Takes at bedtime.  COPD:proair,anoro,arnuity. Follows ith Dr. Chase Caller. Hx of lung cancer. Breathing worse when rainy. Doing well overall. Does limit activities (like yard work) to an hour.  Osteopenia:vitamin D, weight bearing exercise; dietary calcium. Allergies:flonase just as needed. Taking zyrtec every morning. Occasionally uses mucinex.  Arthritis:glucosamine, tylenol BID.   Is starting back at [employer] tomorrow in smaller class. Grateful to be back doing water classes.   Past Medical History:  Diagnosis Date  . Allergic rhinitis   . Anemia   . Arthritis    all over  . Bilateral cataracts   . Bronchogenic lung cancer ([place]) 09/12/2011    # Stage 1 lung cancer: pT1b, pN0 Stage 08 October 2011 - s/p lobectomy by Dr Servando Snare - surveillance by Dr Earlie Server - 05/15/13 - Clear CT. No recurrence - October 2015  - clear CT,No recurrence             -Oct 2016 >no recurrence   . Chicken pox   . Cholelithiasis 05/21/2014   On CT scan 2015   . COPD (chronic obstructive pulmonary disease) ([place])   . Depression    hx ocd and anxiety in record as well  . Gout    last time 6-77yrs ago   . History of blood transfusion   . Hot flashes    takes Prozac daily  . Hx of migraines 90's   after menopause HA stopped  . Hyperlipidemia    taking Flax Seed Oil and Fish Oil  . Hypertension    takes Amlodipine nightly  . IBS (irritable bowel syndrome)   . Liver cyst   . Lung cancer ([place])   . Macrocytosis   . Migraine    MIGRAINES RESOLVED WITH MENOPAUSE  . Osteopenia    takes Calcium and Vit D bid  . Pneumonia    x 2 ;last time back in 1999  . PONV (postoperative nausea and vomiting)   . 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 Seasonal allergies   . Smoking 09/12/2011   Past Surgical History:  Procedure Laterality Date  . CARPAL TUNNEL RELEASE  80's   right   . COLONOSCOPY    . cyst removed  >43yrs ago   from left leg  . DILATION AND CURETTAGE OF UTERUS     at age 25  . EXCISIONAL TOTAL KNEE ARTHROPLASTY Right 06/03/2014   Procedure: RIGHT KNEE SCAR EXCISION;  Surgeon: [name],  MD;  Location: [facility];  Service: Orthopedics;  Laterality: Right;  . INGUINAL HERNIA REPAIR  2000  . ROTATOR CUFF REPAIR  2001   right  . TONSILLECTOMY  as a child   and adenoids  . TOTAL ABDOMINAL HYSTERECTOMY  2003   benign tumor  . TOTAL HIP ARTHROPLASTY  2011   left  . TOTAL HIP ARTHROPLASTY  03/07/2012   Procedure: TOTAL HIP ARTHROPLASTY ANTERIOR APPROACH;  Surgeon: [name], MD;  Location: [facility];  Service: Orthopedics;  Laterality: Right;  . TOTAL KNEE ARTHROPLASTY Right 01/02/2013   Procedure: RIGHT TOTAL KNEE ARTHROPLASTY;  Surgeon: [name], MD;  Location: [facility];  Service: Orthopedics;  Laterality: Right;  . TOTAL KNEE ARTHROPLASTY Left 04/03/2013    Procedure: LEFT TOTAL KNEE ARTHROPLASTY;  Surgeon: [name], MD;  Location: [facility];  Service: Orthopedics;  Laterality: Left;  Marland Kitchen VIDEO BRONCHOSCOPY  10/20/2011   Procedure: VIDEO BRONCHOSCOPY;  Surgeon: Grace Isaac, MD;  Location: [facility];  Service: Thoracic;  Laterality: N/A;  . WEDGE RESECTION  10-20-2011   rt lung  - for lung cancer   Allergies  Allergen Reactions  . Clindamycin/Lincomycin Nausea And Vomiting  . Penicillins Rash       . Spiriva [Tiotropium Bromide Monohydrate]     General intolerance  . Sulfa Antibiotics Hives and Rash    Severe Hives with skin reaction   Current Meds  Medication Sig  . Acetaminophen (TYLENOL PO) Take by mouth as needed. Alternates with Ibuprofen  . amLODipine (NORVASC) 5 MG tablet TAKE 1 TABLET BY MOUTH AT BEDTIME  . ANORO ELLIPTA 62.5-25 MCG/INH AEPB Inhale 1 puff by mouth once daily  . ARNUITY ELLIPTA 100 MCG/ACT AEPB Inhale 1 puff by mouth once daily  . Cholecalciferol (VITAMIN D-3 PO) Take 1,200 mg by mouth every morning.  . Cyanocobalamin (VITAMIN B-12 PO) Take by mouth.  Marland Kitchen FLUoxetine (PROZAC) 20 MG capsule TAKE 1 CAPSULE BY MOUTH TWICE DAILY  . fluticasone (FLONASE) 50 MCG/ACT nasal spray Place 2 sprays into both nostrils daily.  . Glucosamine 500 MG CAPS Take 1,200 mg by mouth 2 (two) times daily.  . GuaiFENesin (MUCINEX PO) Take by mouth as needed.  Marland Kitchen PROAIR HFA 108 (90 Base) MCG/ACT inhaler INHALE 2 PUFFS BY MOUTH EVERY 6 HOURS AS NEEDED FOR WHEEZING OR SHORTNESS OF BREATH  . rosuvastatin (CRESTOR) 5 MG tablet TAKE 1 TABLET BY MOUTH ONCE DAILY  . sodium chloride (OCEAN) 0.65 % SOLN nasal spray Place 1 spray into both nostrils daily as needed for congestion.  . triamcinolone ointment (KENALOG) 0.5 % Apply 1 application topically 2 (two) times daily.  . vitamin E 400 UNIT capsule Take 400 Units by mouth daily.   Social History   Tobacco Use  . Smoking status: Former Smoker    Packs/day: 0.50    Years: 50.00    Pack years:  25.00    Types: Cigarettes    Quit date: 09/23/2011    Years since quitting: 7.3  . Smokeless tobacco: Never Used  Substance Use Topics  . Alcohol use: Yes    Comment: 2 beers every night   Family History  Problem Relation Age of Onset  . Other Mother        polyarteritis  . Bowel Disease Father 103       colectomy/bowel obstruction issues  . Heart attack Father   . Hypertension Brother   . Arthritis Brother   . Post-traumatic stress disorder Brother   .  Alcohol abuse Other   . Depression Other   . Hearing loss Other   . Heart disease Other   . Hypertension Other   . Anesthesia problems Neg Hx   . Hypotension Neg Hx   . Malignant hyperthermia Neg Hx   . Pseudochol deficiency Neg Hx      Review of Systems  Constitutional: Negative for chills, fatigue and fever.  Respiratory: Negative for cough, chest tightness, shortness of breath and wheezing.   Cardiovascular: Negative for chest pain, palpitations and leg swelling.  Musculoskeletal: Positive for arthralgias.  Neurological: Negative for dizziness and headaches.    Objective:  There were no vitals taken for this visit.      BP Readings from Last 3 Encounters:  08/18/18 129/87  07/03/18 110/80  06/09/18 116/74   Wt Readings from Last 3 Encounters:  08/25/18 130 lb (59 kg)  08/18/18 130 lb 3.2 oz (59.1 kg)  07/03/18 133 lb 3.2 oz (60.4 kg)    EXAM:  GENERAL: alert, oriented, appears well and in no acute distress  HEENT: atraumatic, conjunctiva clear, no obvious abnormalities on inspection of external nose and ears  NECK: normal movements of the head and neck  LUNGS: on inspection no signs of respiratory distress, breathing rate appears normal, no obvious gross SOB, gasping or wheezing   PSYCH/NEURO: pleasant and cooperative, no obvious depression or anxiety, speech and thought processing grossly intact    Assessment/Plan  1. Essential hypertension Has been well controlled. Continue current  medication.  - CBC with Differential/Platelet; Future - Comprehensive metabolic panel; Future  2. Atherosclerosis of aorta ([place]) Noted on imaging. Recent stable stress testing through cardiology.  3. Moderate COPD (chronic obstructive pulmonary disease), Sees Dr. Chase Caller, Pulm Has been well controlled. Exercising regularly.   4. Osteopenia: on vitamin D; dietary calcium, weight bearing exercises.   5. Mixed hyperlipidemia Continue crestor. - Lipid panel; Future  6. Gallstones Asymptomatic. Have been present for years.  7. Screening for colon cancer - Cologuard; Future    Return in about 6 months (around 08/15/2019) for Chronic condition visit.   I discussed the assessment and treatment plan with the patient. The patient was provided an opportunity to ask questions and all were answered. The patient agreed with the plan and demonstrated an understanding of the instructions.   The patient was advised to call back or seek an in-person evaluation if the symptoms worsen or if the condition fails to improve as anticipated.  I provided 25 minutes of non-face-to-face time during this encounter.   Micheline Rough, MD

## 2019-02-13 ENCOUNTER — Telehealth: Payer: Self-pay | Admitting: *Deleted

## 2019-02-13 NOTE — Telephone Encounter (Signed)
-----   Message from Caren Macadam, MD sent at 02/12/2019  9:49 AM EDT ----- Please schedule bloodwork visit for her. I have ordered cologuard fyi. Please complete PHQ as well.

## 2019-02-13 NOTE — Telephone Encounter (Signed)
I left a message for the pt to return my call. 

## 2019-02-13 NOTE — Telephone Encounter (Signed)
Patient called back, was informed of the message below and a lab appt was scheduled for 7/10.  PHQ-9 completed.

## 2019-02-14 DIAGNOSIS — H34832 Tributary (branch) retinal vein occlusion, left eye, with macular edema: Secondary | ICD-10-CM | POA: Diagnosis not present

## 2019-02-14 DIAGNOSIS — H43812 Vitreous degeneration, left eye: Secondary | ICD-10-CM | POA: Diagnosis not present

## 2019-02-14 DIAGNOSIS — H43811 Vitreous degeneration, right eye: Secondary | ICD-10-CM | POA: Diagnosis not present

## 2019-02-16 ENCOUNTER — Other Ambulatory Visit (INDEPENDENT_AMBULATORY_CARE_PROVIDER_SITE_OTHER): Payer: Medicare Other

## 2019-02-16 ENCOUNTER — Other Ambulatory Visit: Payer: Self-pay

## 2019-02-16 DIAGNOSIS — E782 Mixed hyperlipidemia: Secondary | ICD-10-CM | POA: Diagnosis not present

## 2019-02-16 DIAGNOSIS — I10 Essential (primary) hypertension: Secondary | ICD-10-CM | POA: Diagnosis not present

## 2019-02-16 LAB — CBC WITH DIFFERENTIAL/PLATELET
Basophils Absolute: 0.1 10*3/uL (ref 0.0–0.1)
Basophils Relative: 1.9 % (ref 0.0–3.0)
Eosinophils Absolute: 0.1 10*3/uL (ref 0.0–0.7)
Eosinophils Relative: 1.6 % (ref 0.0–5.0)
HCT: 43.9 % (ref 36.0–46.0)
Hemoglobin: 14.8 g/dL (ref 12.0–15.0)
Lymphocytes Relative: 20.5 % (ref 12.0–46.0)
Lymphs Abs: 1.1 10*3/uL (ref 0.7–4.0)
MCHC: 33.6 g/dL (ref 30.0–36.0)
MCV: 98 fl (ref 78.0–100.0)
Monocytes Absolute: 0.4 10*3/uL (ref 0.1–1.0)
Monocytes Relative: 8.1 % (ref 3.0–12.0)
Neutro Abs: 3.6 10*3/uL (ref 1.4–7.7)
Neutrophils Relative %: 67.9 % (ref 43.0–77.0)
Platelets: 169 10*3/uL (ref 150.0–400.0)
RBC: 4.48 Mil/uL (ref 3.87–5.11)
RDW: 11.8 % (ref 11.5–15.5)
WBC: 5.3 10*3/uL (ref 4.0–10.5)

## 2019-02-16 LAB — COMPREHENSIVE METABOLIC PANEL
ALT: 14 U/L (ref 0–35)
AST: 15 U/L (ref 0–37)
Albumin: 4.5 g/dL (ref 3.5–5.2)
Alkaline Phosphatase: 59 U/L (ref 39–117)
BUN: 10 mg/dL (ref 6–23)
CO2: 28 mEq/L (ref 19–32)
Calcium: 9.1 mg/dL (ref 8.4–10.5)
Chloride: 101 mEq/L (ref 96–112)
Creatinine, Ser: 0.96 mg/dL (ref 0.40–1.20)
GFR: 56.55 mL/min — ABNORMAL LOW (ref >60.00)
Glucose, Bld: 87 mg/dL (ref 70–99)
Potassium: 4 mEq/L (ref 3.5–5.1)
Sodium: 137 mEq/L (ref 135–145)
Total Bilirubin: 0.9 mg/dL (ref 0.2–1.2)
Total Protein: 6.2 g/dL (ref 6.0–8.3)

## 2019-02-16 LAB — LIPID PANEL
Cholesterol: 134 mg/dL (ref 0–200)
HDL: 57.8 mg/dL (ref >39.00)
LDL Cholesterol: 60 mg/dL (ref 0–99)
NonHDL: 76.42
Total CHOL/HDL Ratio: 2
Triglycerides: 81 mg/dL (ref 0.0–149.0)
VLDL: 16.2 mg/dL (ref 0.0–40.0)

## 2019-02-18 ENCOUNTER — Other Ambulatory Visit: Payer: Self-pay | Admitting: Family Medicine

## 2019-02-19 ENCOUNTER — Ambulatory Visit: Payer: Medicare Other

## 2019-02-19 ENCOUNTER — Other Ambulatory Visit: Payer: Self-pay | Admitting: *Deleted

## 2019-02-19 MED ORDER — FLUOXETINE HCL 20 MG PO CAPS: 20.0000 mg | ORAL_CAPSULE | Freq: Two times a day (BID) | ORAL | 1 refills | Status: DC

## 2019-02-21 ENCOUNTER — Other Ambulatory Visit: Payer: Self-pay | Admitting: Family Medicine

## 2019-02-21 DIAGNOSIS — Z1231 Encounter for screening mammogram for malignant neoplasm of breast: Secondary | ICD-10-CM

## 2019-04-03 ENCOUNTER — Other Ambulatory Visit: Payer: Self-pay | Admitting: Family Medicine

## 2019-04-09 ENCOUNTER — Ambulatory Visit
Admission: RE | Admit: 2019-04-09 | Discharge: 2019-04-09 | Disposition: A | Discharge status: Home / Self Care | Payer: Medicare Other | Source: Ambulatory Visit | Attending: Family Medicine | Admitting: Family Medicine

## 2019-04-09 ENCOUNTER — Other Ambulatory Visit: Payer: Self-pay

## 2019-04-09 DIAGNOSIS — Z1231 Encounter for screening mammogram for malignant neoplasm of breast: Secondary | ICD-10-CM

## 2019-04-10 ENCOUNTER — Ambulatory Visit (INDEPENDENT_AMBULATORY_CARE_PROVIDER_SITE_OTHER): Payer: Medicare Other

## 2019-04-10 ENCOUNTER — Other Ambulatory Visit: Payer: Self-pay

## 2019-04-10 DIAGNOSIS — Z23 Encounter for immunization: Secondary | ICD-10-CM

## 2019-04-26 DIAGNOSIS — H34832 Tributary (branch) retinal vein occlusion, left eye, with macular edema: Secondary | ICD-10-CM | POA: Diagnosis not present

## 2019-04-26 DIAGNOSIS — H35352 Cystoid macular degeneration, left eye: Secondary | ICD-10-CM | POA: Diagnosis not present

## 2019-04-26 DIAGNOSIS — H43811 Vitreous degeneration, right eye: Secondary | ICD-10-CM | POA: Diagnosis not present

## 2019-04-26 DIAGNOSIS — H43812 Vitreous degeneration, left eye: Secondary | ICD-10-CM | POA: Diagnosis not present

## 2019-04-26 DIAGNOSIS — H35042 Retinal micro-aneurysms, unspecified, left eye: Secondary | ICD-10-CM | POA: Diagnosis not present

## 2019-05-11 ENCOUNTER — Telehealth: Payer: Self-pay | Admitting: *Deleted

## 2019-05-11 NOTE — Telephone Encounter (Signed)
I called the pt and informed her the order was printed and faxed to [facility] [facility] at [phone].

## 2019-05-11 NOTE — Telephone Encounter (Signed)
Copied from [place] [id number]. Topic: General - Other >> May 11, 2019 11:14 AM Lennox Solders wrote: Reason for CRM:pt had  phone visit in July and  she is still waiting on cologuard kit

## 2019-05-21 DIAGNOSIS — Z1211 Encounter for screening for malignant neoplasm of colon: Secondary | ICD-10-CM | POA: Diagnosis not present

## 2019-05-23 ENCOUNTER — Ambulatory Visit: Payer: Medicare Other | Admitting: Internal Medicine

## 2019-05-23 ENCOUNTER — Encounter: Payer: Self-pay | Admitting: Internal Medicine

## 2019-05-23 ENCOUNTER — Other Ambulatory Visit: Payer: Self-pay

## 2019-05-23 VITALS — BP 122/78 | HR 76 | Temp 97.1°F | Ht 62.0 in | Wt 126.2 lb

## 2019-05-23 DIAGNOSIS — J449 Chronic obstructive pulmonary disease, unspecified: Secondary | ICD-10-CM | POA: Diagnosis not present

## 2019-05-23 DIAGNOSIS — Z08 Encounter for follow-up examination after completed treatment for malignant neoplasm: Secondary | ICD-10-CM | POA: Diagnosis not present

## 2019-05-23 DIAGNOSIS — J441 Chronic obstructive pulmonary disease with (acute) exacerbation: Secondary | ICD-10-CM | POA: Diagnosis not present

## 2019-05-23 DIAGNOSIS — Z85118 Personal history of other malignant neoplasm of bronchus and lung: Secondary | ICD-10-CM | POA: Diagnosis not present

## 2019-05-23 MED ORDER — TRELEGY ELLIPTA 100-62.5-25 MCG/INH IN AEPB: 1.0000 | INHALATION_SPRAY | Freq: Every day | RESPIRATORY_TRACT | 0 refills | Status: DC

## 2019-05-23 NOTE — Addendum Note (Signed)
Addended by: Nena Polio on: 05/23/2019 10:41 AM   Modules accepted: Orders

## 2019-05-23 NOTE — Addendum Note (Signed)
Addended by: Nena Polio on: 05/23/2019 03:14 PM   Modules accepted: Orders

## 2019-05-23 NOTE — Progress Notes (Signed)
#COPD-MM genotype - 2013: Pre RUL lobectomy and Pre Rx: fev1 1.1L/60% , DLCO 9.6/56%, TLC 5.6/128%  - 2013 May: Post lobectomy and post Rx: fev1: 1.28L/60%, ratio 49 . No desat walk test - Rx spiriva, symbicort since spring 2013 - 2014 Nov: 06/22/2013. FEV1 is 1.3 L/62%. No post bronchodilator change. FVC is 2.6 L postop day later which is 95%. Ratio is 50. Total lung capacity is 4.96L/104%. DLCO is level 0.7/54%. - On synmbicort alone 04/05/2014 - Walk test today 185 feet x 3 laps on RA: Desat to < 88% at 2 laps - Change to [facility] plus Spiriva Respimat April 2016  # Stage 1 lung cancer: pT1b, pN0 Stage 08 October 2011 - s/p lobectomy by Dr Servando Snare - surveillance by Dr Earlie Server - 05/15/13 - Clear CT. No recurrence - October 2015 - clear CT,No recurrence  -Oct 2016 >no recurrence   09/11/2015 Follow up : COPD  Pt returns for 4 weeks follow up for COPD  PFT done today were reviewed with pt and showed  FEV 1 56%, ratio 49 , FVC 87%,  DLCO 43%  This is down slightly from 2014 (FEV1 62%)  CT chest in Oct 2016 did show mod emphysema .  She remains on Spiriva and BREO .  She has tried Symbicort and Dulera in past.  She says she does get winded with activity -but tried to stay active.  Has minimal cough . Sometimes has some wheezing and sinus drainge.  Denies chest pain, orthopnea, edema or hemoptysis .   Most recent CT chest Oct 2016 survelliance showed no cancer recurrence . Denies any chest pain, orthopnea, PND, leg swelling or hemoptysis.  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 She has questions regarding her prognosis .  Worried she will not be able to help him. "I need to out live him and be able to care for him"  She remains active with Swimming at [employer] and is part of the :"[employer]. "    OV 01/12/2016  Chief Complaint  Patient presents with  . Follow-up    Pt c/o some dry cough and SOB with exertion. Pt denies  wheeze/CP/tightness.    Moderate COPD lung cancer patient  Pulmonary function Feb 2017 showed Gold stage II COPD. CT scan of the chest October 2016 did not show any lung cancer recurrence. She is on triple inhaler therapy. She wants to switch to another version of triple inhaler therapy namely ANORO with and ICS. This is because of friend has had good experience with it. She continues her shortness of breath with exertion but is reluctant to try oxygen. We discussed nocturnal oxygen. She says she will consider the indigent system on her own. COPD cat score is 19. In the interim she has developed mild intention tremor with inhalers are not bothering her as much.   OV 06/08/2016  Chief Complaint  Patient presents with  . Follow-up    Pt state breathing is pretty good. Pt states congestion from allergiesThe [employer] work). Pt c/o no tightness in chest, SOB w/ excertion, Pt states some coughing because of dry air. loves the Anora but can not afford (doughnut hole)     She had CT scan of the chest 05/21/2016 and shows cancer in remission. No new infiltrates. She also had blood work 05/21/2016 with a normal creatinine of 0.8 mg percent and hemoglobin of 14.9 g percent. Overall COPD stable. She does not have any problems she is on triple inhaler therapy  which works well for her. She had baseline has exertional desaturations but is not using oxygen because of reluctance. She is planning to travel to [place] in [place] in 6 months and is inquiring about portable oxygen systems for her travel.   Walking desaturation test 185 feet 3 laps on room air 06/08/2016:Desaturated to 87% at the end of second lap   OV 03/02/2017  Chief Complaint  Patient presents with  . Follow-up    Pt states her breathing is unchanged since last OV. Pt c/o dry cough. Pt denies CP/tightness and f/c/s/.     Follow-up moderate COPD. Since last visit in October 2017 things are stable. She is on triple inhaler therapy. She canceled her  trip to [place]. He insists that she and her husband are going to [place] in [place] in September 2018. She has a follow-up scan for her lung cancer coming up in October 2018. This is from Dr. Julien Nordmann. She uses oxygen with exertion. She is started on the new shingles vaccine. Booster shot is due.  OV 08/31/2017  Chief Complaint  Patient presents with  . Follow-up    has occassional SOB,has non productive cough,uses inhaler anuity,anoro inhalers     Last visit July 2018.  There is a follow-up for moderate COPD.  She continues on triple inhaler therapy.  Since then her COPD CAT score is improved to 13.  Overall she is feeling fine without any exacerbations.  She has had to cancel her trip to [place] and [place] because of her husband's memory issues and back issues.  He is having back surgery in 2 weeks.  She plans to go to [place] and [place] and mid 2019.  She is up-to-date with her flu shot.  There are no main issues.  Earlier this year she had CT scan of the chest for lung cancer surveillance and there is no recurrence.  She has coronary artery calcification but she says she had a stress test in 2013 that was normal.  There is no active chest pain with exertion.      OV 06/09/2018  Subjective:  Patient ID: [name], female , DOB: December 05, 1940 , age 78 y.o. , MRN: [id number] , ADDRESS: [place] [id number]   06/09/2018 -   Chief Complaint  Patient presents with  . Follow-up    9 month follow up for COPD. Per patient her breathing has been up and down since her last visit. Cold weather doesn't seem to bother her. Wants to discuss a possible chest vest. Is requesting a CT chest since she hasn't one in almost 2 years.       HPI [name] 78 y.o. -female   Moderate COPD follow-up: Doing well.  She feels symptoms are baseline and well-controlled.  Takes Anoro daily.  Takes Arnuity at night but occasionally will not take it.  Is up-to-date with the flu shot and  pneumonia vaccines.  Lung cancer surveillance: Last CT scan of the chest October 2018 without tumor recurrence.  She somewhat thinks it has been 2 years since she had a previous scan.  She is asking for repeat scan.  No other new issues   OV 05/23/2019  Subjective:  Patient ID: [name], female , DOB: 11-22-1940 , age 24 y.o. , MRN: [id number] , ADDRESS: [place] [id number]   05/23/2019 -   Chief Complaint  Patient presents with  . Moderate COPD (chronic obstructive pulmonary disease)    Has noticed  she is not able to work in yard as long as she could last year. But feels her medications are still working. Using the ProAir more than she used to.   -  Gold stage II COPD follow-up -Lung cancer screening follow-up  HPI [name] 78 y.o. -presents for follow-up.  It is almost 1 year since I saw her.  She tells me in March 2020 she had influenza and pneumonia.  Since then she has had a deterioration in her symptoms.  She is not able to do yard work as much.  She is on Anoro and Arnuity and she is asking for something stronger.  Looking at his COPD CAT score it appears she is stable actually.  She is not on oxygen.  Her last CT scan was in November 2019 without any recurrence of lung cancer.  After that she did have cardiac stress test and this was normal.  She is neither on night oxygen or day oxygen.  She is interested in an inhaler upgrade.  She is also open to the idea of pulmonary rehabilitation.  She also tells me that it has been many years since she had pulmonary function test and she is open to getting this retested as well as testing her oxygen levels.      CAT COPD Symptom & Quality of Life Score (GSK trademark) 0 is no burden. 5 is highest burden 01/12/2016  08/31/2017  05/23/2019   Never Cough -> Cough all the time 2 2 2   No phlegm in chest -> Chest is full of phlegm 2 1 2   No chest tightness -> Chest feels very tight 1 0 0  No dyspnea  for 1 flight stairs/hill -> Very dyspneic for 1 flight of stairs 4 3 4   No limitations for ADL at home -> Very limited with ADL at home 2 3 2   Confident leaving home -> Not at all confident leaving home 1 0 1  Sleep soundly -> Do not sleep soundly because of lung condition 3 2 1   Lots of Energy -> No energy at all 4 2 2   TOTAL Score (max 40)  19 13 14     IMPRESSION: 1. No evidence of recurrent or metastatic disease. 2. Cholelithiasis. 3. Aortic atherosclerosis (ICD10-170.0). Three-vessel coronary artery calcification. 4. Enlarged pulmonary arteries, indicative of pulmonary arterial hypertension. 5.  Emphysema (ICD10-J43.9).   Electronically Signed   By: Lorin Picket M.D.   On: 06/19/2018 11:34   ROS - per HPI     has a past medical history of Allergic rhinitis, Anemia, Arthritis, Bilateral cataracts, Bronchogenic lung cancer ([place]) (09/12/2011), Chicken pox, Cholelithiasis (05/21/2014), COPD (chronic obstructive pulmonary disease) ([place]), Depression, Gout, History of blood transfusion, Hot flashes, migraines (90's), Hyperlipidemia, Hypertension, IBS (irritable bowel syndrome), Liver cyst, Lung cancer ([place]), Macrocytosis, Migraine, Osteopenia, Pneumonia, PONV (postoperative nausea and vomiting), S/P right THA, AA - Dr. 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   reports that she quit smoking about 7 years ago. Her smoking use included cigarettes. She has a 25.00 pack-year smoking history. She has never used smokeless tobacco.  Past Surgical History:  Procedure Laterality Date  . CARPAL TUNNEL RELEASE  80's   right   . COLONOSCOPY    . cyst removed  >46yrs ago   from left leg  . DILATION AND CURETTAGE OF UTERUS     at age 22  . EXCISIONAL TOTAL KNEE ARTHROPLASTY Right 06/03/2014   Procedure: RIGHT KNEE SCAR  EXCISION;  Surgeon: [name], MD;  Location: [facility];  Service: Orthopedics;  Laterality: Right;  . INGUINAL  HERNIA REPAIR  2000  . ROTATOR CUFF REPAIR  2001   right  . TONSILLECTOMY  as a child   and adenoids  . TOTAL ABDOMINAL HYSTERECTOMY  2003   benign tumor-[facility]  . TOTAL HIP ARTHROPLASTY  2011   left  . TOTAL HIP ARTHROPLASTY  03/07/2012   Procedure: TOTAL HIP ARTHROPLASTY ANTERIOR APPROACH;  Surgeon: [name], MD;  Location: [facility];  Service: Orthopedics;  Laterality: Right;  . TOTAL KNEE ARTHROPLASTY Right 01/02/2013   Procedure: RIGHT TOTAL KNEE ARTHROPLASTY;  Surgeon: [name], MD;  Location: [facility];  Service: Orthopedics;  Laterality: Right;  . TOTAL KNEE ARTHROPLASTY Left 04/03/2013   Procedure: LEFT TOTAL KNEE ARTHROPLASTY;  Surgeon: [name], MD;  Location: [facility];  Service: Orthopedics;  Laterality: Left;  Marland Kitchen VIDEO BRONCHOSCOPY  10/20/2011   Procedure: VIDEO BRONCHOSCOPY;  Surgeon: Grace Isaac, MD;  Location: [facility];  Service: Thoracic;  Laterality: N/A;  . WEDGE RESECTION  10-20-2011   rt lung  - for lung cancer    Allergies  Allergen Reactions  . Clindamycin/Lincomycin Nausea And Vomiting  . Penicillins Rash       . Spiriva [Tiotropium Bromide Monohydrate]     General intolerance  . Sulfa Antibiotics Hives and Rash    Severe Hives with skin reaction    Immunization History  Administered Date(s) Administered  . Fluad Quad(high Dose 65+) 04/10/2019  . Influenza Split 06/02/2011, 05/09/2012  . Influenza, High Dose Seasonal PF 04/19/2016, 04/27/2017, 05/18/2018  . Influenza,inj,Quad PF,6+ Mos 05/18/2013, 04/17/2014, 05/21/2015  . Pneumococcal Conjugate-13 04/05/2014  . Pneumococcal Polysaccharide-23 08/09/1998, 09/21/2011  . Tdap 06/22/2012  . Zoster 08/10/2007    Family History  Problem Relation Age of Onset  . Other Mother        polyarteritis  . Bowel Disease Father 64       colectomy/bowel obstruction issues  . Heart attack Father   . Hypertension Brother   . Arthritis Brother   . Post-traumatic stress disorder Brother   . Alcohol  abuse Other   . Depression Other   . Hearing loss Other   . Heart disease Other   . Hypertension Other   . Anesthesia problems Neg Hx   . Hypotension Neg Hx   . Malignant hyperthermia Neg Hx   . Pseudochol deficiency Neg Hx   . Breast cancer Neg Hx      Current Outpatient Medications:  .  Acetaminophen (TYLENOL PO), Take by mouth as needed. Alternates with Ibuprofen, Disp: , Rfl:  .  amLODipine (NORVASC) 5 MG tablet, TAKE 1 TABLET BY MOUTH AT BEDTIME, Disp: 90 tablet, Rfl: 0 .  ANORO ELLIPTA 62.5-25 MCG/INH AEPB, Inhale 1 puff by mouth once daily, Disp: 60 each, Rfl: 5 .  ARNUITY ELLIPTA 100 MCG/ACT AEPB, Inhale 1 puff by mouth once daily, Disp: 30 each, Rfl: 5 .  Cholecalciferol (VITAMIN D-3 PO), Take 1,200 mg by mouth every morning., Disp: , Rfl:  .  Cyanocobalamin (VITAMIN B-12 PO), Take by mouth., Disp: , Rfl:  .  FLUoxetine (PROZAC) 20 MG capsule, Take 1 capsule (20 mg total) by mouth 2 (two) times daily., Disp: 180 capsule, Rfl: 1 .  fluticasone (FLONASE) 50 MCG/ACT nasal spray, Place 2 sprays into both nostrils daily. (Patient taking differently: Place 2 sprays into both nostrils daily as needed. ), Disp: 16 g, Rfl:  5 .  Glucosamine 500 MG CAPS, Take 1,200 mg by mouth 2 (two) times daily., Disp: , Rfl:  .  PROAIR HFA 108 (90 Base) MCG/ACT inhaler, INHALE 2 PUFFS BY MOUTH EVERY 6 HOURS AS NEEDED FOR WHEEZING OR SHORTNESS OF BREATH, Disp: 1 Inhaler, Rfl: 1 .  Probiotic Product (PROBIOTIC DAILY PO), Take by mouth daily. IB Guard, Disp: , Rfl:  .  rosuvastatin (CRESTOR) 5 MG tablet, Take 1 tablet by mouth once daily, Disp: 90 tablet, Rfl: 0 .  sodium chloride (OCEAN) 0.65 % SOLN nasal spray, Place 1 spray into both nostrils daily as needed for congestion., Disp: , Rfl:  .  vitamin E 400 UNIT capsule, Take 400 Units by mouth daily., Disp: , Rfl:       Objective:   Vitals:   05/23/19 0951  BP: 122/78  Pulse: 76  Temp: (!) 97.1 F (36.2 C)  SpO2: 95%  Weight: 126 lb 3.2 oz  (57.2 kg)  Height: 5\' 2"  (1.575 m)    Estimated body mass index is 23.08 kg/m as calculated from the following:   Height as of this encounter: 5\' 2"  (1.575 m).   Weight as of this encounter: 126 lb 3.2 oz (57.2 kg).  @WEIGHTCHANGE @  [employer]   05/23/19 0951  Weight: 126 lb 3.2 oz (57.2 kg)     Physical Exam  General Appearance:    Alert, cooperative, no distress, appears stated age - yes , Deconditioned looking - no , OBESE  - no, Sitting on Wheelchair -  no  Head:    Normocephalic, without obvious abnormality, atraumatic  Eyes:    PERRL, conjunctiva/corneas clear,  Ears:    Normal TM's and external ear canals, both ears  Nose:   Nares normal, septum midline, mucosa normal, no drainage    or sinus tenderness. OXYGEN ON  - no . Patient is @ ra   Throat:   Lips, mucosa, and tongue normal; teeth and gums normal. Cyanosis on lips - no  Neck:   Supple, symmetrical, trachea midline, no adenopathy;    thyroid:  no enlargement/tenderness/nodules; no carotid   bruit or JVD  Back:     Symmetric, no curvature, ROM normal, no CVA tenderness  Lungs:     Distress - nonono , Wheeze no, Barrell Chest - no, Purse lip breathing - no, Crackles - no   Chest Wall:    No tenderness or deformity.    Heart:    Regular rate and rhythm, S1 and S2 normal, no rub   or gallop, Murmur - no  Breast Exam:    NOT DONE  Abdomen:     Soft, non-tender, bowel sounds active all four quadrants,    no masses, no organomegaly. Visceral obesity - no  Genitalia:   NOT DONE  Rectal:   NOT DONE  Extremities:   Extremities - normal, Has Cane - YES, Clubbing - no, Edema - no  Pulses:   2+ and symmetric all extremities  Skin:   Stigmata of Connective Tissue Disease - no  Lymph nodes:   Cervical, supraclavicular, and axillary nodes normal  Psychiatric:  Neurologic:   Pleasant - yes, Anxious - no, Flat affect - no  CAm-ICU - neg, Alert and Oriented x 3 - yes, Moves all 4s - yes, Speech - normal, Cognition - intact            Assessment:       ICD-10-CM   1. Moderate COPD (chronic obstructive pulmonary disease), Sees  Dr. Chase Caller, Pulm  J44.9   2. Encounter for follow-up surveillance of lung cancer  Z08    Z85.118        Plan:     Patient Instructions  Moderate COPD (chronic obstructive pulmonary disease), Sees Dr. Chase Caller, [facility] Encounter for follow-up surveillance of lung cancer   COPD -  - this is stable.   - Glad you are up-to-date with the flu shot.    - Change Anoro and arnuity to  TRELEGY once daily  - Use albuterol as needed. - test ONO on room air  - do spirometry and dlco next 1-2 months  - refer pulmonary rehab  Lung cancer surveillance:  -  Last CT scan was actually 1 year ago in Nov 2019  - .  Repeat CT scan of the chest without contrast after 06/24/2019   Follow-up Late Nov 2020 after CT chest and spirometrry; CAT score at folowup     SIGNATURE    Dr. Brand Males, M.D., F.C.C.P,  Pulmonary and Critical Care Medicine Staff Physician, [place] Director - Interstitial Lung Disease  Program  Pulmonary [place] at [place], [place], [address]  Pager: [phone], If no answer or between  15:00h - 7:00h: call [phone] Telephone: [phone]  10:26 AM 05/23/2019

## 2019-05-23 NOTE — Patient Instructions (Addendum)
Moderate COPD (chronic obstructive pulmonary disease), Sees Dr. Chase Caller, Pulm Encounter for follow-up surveillance of lung cancer   COPD -  - this is stable.   - Glad you are up-to-date with the flu shot.    - Change Anoro and arnuity to  TRELEGY once daily  - Use albuterol as needed. - test ONO on room air  - do spirometry and dlco next 1-2 months  - refer pulmonary rehab  Lung cancer surveillance:  -  Last CT scan was actually 1 year ago in Nov 2019  - .  Repeat CT scan of the chest without contrast after 06/24/2019   Follow-up Late Nov 2020 after CT chest and spirometrry; CAT score at folowup

## 2019-05-25 LAB — COLOGUARD: Cologuard: NEGATIVE

## 2019-05-29 ENCOUNTER — Encounter: Payer: Self-pay | Admitting: Family Medicine

## 2019-05-29 ENCOUNTER — Telehealth: Payer: Self-pay | Admitting: Internal Medicine

## 2019-05-29 DIAGNOSIS — Z6822 Body mass index (BMI) 22.0-22.9, adult: Secondary | ICD-10-CM | POA: Diagnosis not present

## 2019-05-29 DIAGNOSIS — M14672 Charcot's joint, left ankle and foot: Secondary | ICD-10-CM | POA: Diagnosis not present

## 2019-05-29 DIAGNOSIS — M2170 Unequal limb length (acquired), unspecified site: Secondary | ICD-10-CM | POA: Diagnosis not present

## 2019-05-29 DIAGNOSIS — J449 Chronic obstructive pulmonary disease, unspecified: Secondary | ICD-10-CM

## 2019-05-29 DIAGNOSIS — M2550 Pain in unspecified joint: Secondary | ICD-10-CM | POA: Diagnosis not present

## 2019-05-29 DIAGNOSIS — M150 Primary generalized (osteo)arthritis: Secondary | ICD-10-CM | POA: Diagnosis not present

## 2019-05-29 NOTE — Addendum Note (Signed)
Addended by: Jannette Spanner on: 05/29/2019 04:20 PM   Modules accepted: Orders

## 2019-05-29 NOTE — Telephone Encounter (Signed)
COPD -  - this is stable.   - Glad you are up-to-date with the flu shot.    - Change Anoro and arnuity to  TRELEGY once daily  - Use albuterol as needed. - test ONO on room air  - do spirometry and dlco next 1-2 months  - refer pulmonary rehab  Will cancel the original order and place the correct order.

## 2019-06-05 ENCOUNTER — Telehealth: Payer: Self-pay | Admitting: Internal Medicine

## 2019-06-05 DIAGNOSIS — Z08 Encounter for follow-up examination after completed treatment for malignant neoplasm: Secondary | ICD-10-CM

## 2019-06-05 DIAGNOSIS — J449 Chronic obstructive pulmonary disease, unspecified: Secondary | ICD-10-CM

## 2019-06-05 DIAGNOSIS — Z85118 Personal history of other malignant neoplasm of bronchus and lung: Secondary | ICD-10-CM

## 2019-06-05 MED ORDER — TRELEGY ELLIPTA 100-62.5-25 MCG/INH IN AEPB: 1.0000 | INHALATION_SPRAY | Freq: Every day | RESPIRATORY_TRACT | 5 refills | Status: DC

## 2019-06-05 NOTE — Telephone Encounter (Signed)
Spoke with pt, doing well on Trelegy and would like this sent to her pharmacy. This has been done.  Nothing further needed at this time- will close encounter.

## 2019-06-06 ENCOUNTER — Encounter: Payer: Self-pay | Admitting: Internal Medicine

## 2019-06-06 DIAGNOSIS — R0902 Hypoxemia: Secondary | ICD-10-CM | POA: Diagnosis not present

## 2019-06-06 DIAGNOSIS — J449 Chronic obstructive pulmonary disease, unspecified: Secondary | ICD-10-CM | POA: Diagnosis not present

## 2019-06-07 ENCOUNTER — Telehealth: Payer: Self-pay | Admitting: Internal Medicine

## 2019-06-07 DIAGNOSIS — G4734 Idiopathic sleep related nonobstructive alveolar hypoventilation: Secondary | ICD-10-CM

## 2019-06-07 NOTE — Telephone Encounter (Signed)
Called and spoke to patient. Gave results per Dr. Chase Caller. Patient verbalized understanding.  Patient stated she is not established with a DME. Placed order for nighttime oxygen. Nothing further needed at this time.

## 2019-06-07 NOTE — Telephone Encounter (Signed)
[name]  -had overnight oxygen study on June 06, 2019.  Time spent with a pulse ox less than or equal to 88% is 1 hour and 40 minutes  Plan -Start 2 L oxygen at night -We will assess daytime energy levels and symptoms as a response =-We will do simple walk test at follow-up     SIGNATURE    Dr. Brand Males, M.D., F.C.C.P,  Pulmonary and Critical Care Medicine Staff Physician, [place] Director - Interstitial Lung Disease  Program  Pulmonary [place] at [place], [place], [address]  Pager: [phone], If no answer or between  15:00h - 7:00h: call [phone] Telephone: [phone]  9:12 AM 06/07/2019

## 2019-06-08 ENCOUNTER — Other Ambulatory Visit (HOSPITAL_COMMUNITY)
Admission: RE | Admit: 2019-06-08 | Discharge: 2019-06-08 | Disposition: A | Discharge status: Home / Self Care | Payer: Medicare Other | Source: Ambulatory Visit | Attending: Internal Medicine | Admitting: Internal Medicine

## 2019-06-08 DIAGNOSIS — Z20828 Contact with and (suspected) exposure to other viral communicable diseases: Secondary | ICD-10-CM | POA: Diagnosis not present

## 2019-06-08 DIAGNOSIS — Z01812 Encounter for preprocedural laboratory examination: Secondary | ICD-10-CM | POA: Diagnosis not present

## 2019-06-09 DIAGNOSIS — J449 Chronic obstructive pulmonary disease, unspecified: Secondary | ICD-10-CM | POA: Diagnosis not present

## 2019-06-09 DIAGNOSIS — J439 Emphysema, unspecified: Secondary | ICD-10-CM | POA: Diagnosis not present

## 2019-06-09 DIAGNOSIS — R0600 Dyspnea, unspecified: Secondary | ICD-10-CM | POA: Diagnosis not present

## 2019-06-09 LAB — NOVEL CORONAVIRUS, NAA (HOSP ORDER, SEND-OUT TO REF LAB; TAT 18-24 HRS): SARS-CoV-2, NAA: NOT DETECTED

## 2019-06-11 ENCOUNTER — Other Ambulatory Visit: Payer: Self-pay

## 2019-06-11 ENCOUNTER — Ambulatory Visit (INDEPENDENT_AMBULATORY_CARE_PROVIDER_SITE_OTHER): Payer: Medicare Other | Admitting: Internal Medicine

## 2019-06-11 ENCOUNTER — Ambulatory Visit: Payer: Medicare Other | Admitting: Internal Medicine

## 2019-06-11 DIAGNOSIS — Z85118 Personal history of other malignant neoplasm of bronchus and lung: Secondary | ICD-10-CM

## 2019-06-11 DIAGNOSIS — Z08 Encounter for follow-up examination after completed treatment for malignant neoplasm: Secondary | ICD-10-CM

## 2019-06-11 DIAGNOSIS — H52203 Unspecified astigmatism, bilateral: Secondary | ICD-10-CM | POA: Diagnosis not present

## 2019-06-11 DIAGNOSIS — H04123 Dry eye syndrome of bilateral lacrimal glands: Secondary | ICD-10-CM | POA: Diagnosis not present

## 2019-06-11 DIAGNOSIS — J449 Chronic obstructive pulmonary disease, unspecified: Secondary | ICD-10-CM | POA: Diagnosis not present

## 2019-06-11 DIAGNOSIS — Z961 Presence of intraocular lens: Secondary | ICD-10-CM | POA: Diagnosis not present

## 2019-06-11 LAB — PULMONARY FUNCTION TEST
DL/VA % pred: 52 %
DL/VA: 2.19 ml/min/mmHg/L
DLCO unc % pred: 42 %
DLCO unc: 7.53 ml/min/mmHg
FEF 25-75 Pre: 0.41 L/sec
FEF2575-%Pred-Pre: 27 %
FEV1-%Pred-Pre: 55 %
FEV1-Pre: 1.04 L
FEV1FVC-%Pred-Pre: 65 %
FEV6-%Pred-Pre: 84 %
FEV6-Pre: 2.03 L
FEV6FVC-%Pred-Pre: 99 %
FVC-%Pred-Pre: 84 %
FVC-Pre: 2.14 L
Pre FEV1/FVC ratio: 49 %
Pre FEV6/FVC Ratio: 95 %

## 2019-06-11 NOTE — Progress Notes (Signed)
Spirometry and Dlco done today. 

## 2019-06-18 ENCOUNTER — Ambulatory Visit: Payer: Medicare Other | Admitting: Internal Medicine

## 2019-06-18 ENCOUNTER — Other Ambulatory Visit: Payer: Self-pay

## 2019-06-18 ENCOUNTER — Encounter: Payer: Self-pay | Admitting: Internal Medicine

## 2019-06-18 VITALS — BP 124/80 | HR 71 | Ht 62.0 in | Wt 124.8 lb

## 2019-06-18 DIAGNOSIS — G4734 Idiopathic sleep related nonobstructive alveolar hypoventilation: Secondary | ICD-10-CM | POA: Diagnosis not present

## 2019-06-18 DIAGNOSIS — J449 Chronic obstructive pulmonary disease, unspecified: Secondary | ICD-10-CM

## 2019-06-18 NOTE — Patient Instructions (Addendum)
Moderate COPD (chronic obstructive pulmonary disease), Sees Dr. Chase Caller, [facility] Encounter for follow-up surveillance of lung cancer   COPD -  - this is stable but he has some age-related decline in lung function there is associated with mild desaturations at night. And drops in walking desaturation that is borderline for o2 qualification -Glad Trelegy is working better for you.  Continue thisly  - Use albuterol as needed. -Continue nighttime oxygen that he started 1 week ago;  -In a few weeks to few months if this is not helpful with daytime shortness of breath or energy levels we can stop this  -Do not recommend Roflumilast or chronic daily prednisone at this point due to lack of repeated COPD exacerbations [prednisone should be of last resort and COPD]  -Glad to start working out  Lung cancer surveillance:  -  Last CT scan was actually 1 year ago in Nov 2019  - .  Repeat CT scan of the chest without contrast after 06/24/2019 -already scheduled  Follow-up We will follow up with a CT scan chest results Otherwise 6 months or sooner if needed; CAT score at follow-up and repeat walk test at followup

## 2019-06-18 NOTE — Progress Notes (Signed)
#COPD-MM genotype - 2013: Pre RUL lobectomy and Pre Rx: fev1 1.1L/60% , DLCO 9.6/56%, TLC 5.6/128%  - 2013 May: Post lobectomy and post Rx: fev1: 1.28L/60%, ratio 49 . No desat walk test - Rx spiriva, symbicort since spring 2013 - 2014 Nov: 06/22/2013. FEV1 is 1.3 L/62%. No post bronchodilator change. FVC is 2.6 L postop day later which is 95%. Ratio is 50. Total lung capacity is 4.96L/104%. DLCO is level 0.7/54%. - On synmbicort alone 04/05/2014 - Walk test today 185 feet x 3 laps on RA: Desat to < 88% at 2 laps - Change to [facility] plus Spiriva Respimat April 2016  # Stage 1 lung cancer: pT1b, pN0 Stage 08 October 2011 - s/p lobectomy by Dr Servando Snare - surveillance by Dr Earlie Server - 05/15/13 - Clear CT. No recurrence - October 2015 - clear CT,No recurrence  -Oct 2016 >no recurrence   09/11/2015 Follow up : COPD  Pt returns for 4 weeks follow up for COPD  PFT done today were reviewed with pt and showed  FEV 1 56%, ratio 49 , FVC 87%,  DLCO 43%  This is down slightly from 2014 (FEV1 62%)  CT chest in Oct 2016 did show mod emphysema .  She remains on Spiriva and BREO .  She has tried Symbicort and Dulera in past.  She says she does get winded with activity -but tried to stay active.  Has minimal cough . Sometimes has some wheezing and sinus drainge.  Denies chest pain, orthopnea, edema or hemoptysis .   Most recent CT chest Oct 2016 survelliance showed no cancer recurrence . Denies any chest pain, orthopnea, PND, leg swelling or hemoptysis.  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 She has questions regarding her prognosis .  Worried she will not be able to help him. "I need to out live him and be able to care for him"  She remains active with Swimming at [employer] and is part of the :"[employer]. "    OV 01/12/2016  Chief Complaint  Patient presents with  . Follow-up    Pt c/o some dry cough and SOB with exertion. Pt denies  wheeze/CP/tightness.    Moderate COPD lung cancer patient  Pulmonary function Feb 2017 showed Gold stage II COPD. CT scan of the chest October 2016 did not show any lung cancer recurrence. She is on triple inhaler therapy. She wants to switch to another version of triple inhaler therapy namely ANORO with and ICS. This is because of friend has had good experience with it. She continues her shortness of breath with exertion but is reluctant to try oxygen. We discussed nocturnal oxygen. She says she will consider the indigent system on her own. COPD cat score is 19. In the interim she has developed mild intention tremor with inhalers are not bothering her as much.   OV 06/08/2016  Chief Complaint  Patient presents with  . Follow-up    Pt state breathing is pretty good. Pt states congestion from allergiesThe [employer] work). Pt c/o no tightness in chest, SOB w/ excertion, Pt states some coughing because of dry air. loves the Anora but can not afford (doughnut hole)     She had CT scan of the chest 05/21/2016 and shows cancer in remission. No new infiltrates. She also had blood work 05/21/2016 with a normal creatinine of 0.8 mg percent and hemoglobin of 14.9 g percent. Overall COPD stable. She does not have any problems she is on triple inhaler therapy  which works well for her. She had baseline has exertional desaturations but is not using oxygen because of reluctance. She is planning to travel to [place] in [place] in 6 months and is inquiring about portable oxygen systems for her travel.   Walking desaturation test 185 feet 3 laps on room air 06/08/2016:Desaturated to 87% at the end of second lap   OV 03/02/2017  Chief Complaint  Patient presents with  . Follow-up    Pt states her breathing is unchanged since last OV. Pt c/o dry cough. Pt denies CP/tightness and f/c/s/.     Follow-up moderate COPD. Since last visit in October 2017 things are stable. She is on triple inhaler therapy. She canceled her  trip to [place]. He insists that she and her husband are going to [place] in [place] in September 2018. She has a follow-up scan for her lung cancer coming up in October 2018. This is from Dr. Julien Nordmann. She uses oxygen with exertion. She is started on the new shingles vaccine. Booster shot is due.  OV 08/31/2017  Chief Complaint  Patient presents with  . Follow-up    has occassional SOB,has non productive cough,uses inhaler anuity,anoro inhalers     Last visit July 2018.  There is a follow-up for moderate COPD.  She continues on triple inhaler therapy.  Since then her COPD CAT score is improved to 13.  Overall she is feeling fine without any exacerbations.  She has had to cancel her trip to [place] and [place] because of her husband's memory issues and back issues.  He is having back surgery in 2 weeks.  She plans to go to [place] and [place] and mid 2019.  She is up-to-date with her flu shot.  There are no main issues.  Earlier this year she had CT scan of the chest for lung cancer surveillance and there is no recurrence.  She has coronary artery calcification but she says she had a stress test in 2013 that was normal.  There is no active chest pain with exertion.      OV 06/09/2018  Subjective:  Patient ID: Renne Crigler, female , DOB: February 16, 1941 , age 78 y.o. , MRN: [id number] , ADDRESS: [place] [address]   06/09/2018 -   Chief Complaint  Patient presents with  . Follow-up    9 month follow up for COPD. Per patient her breathing has been up and down since her last visit. Cold weather doesn't seem to bother her. Wants to discuss a possible chest vest. Is requesting a CT chest since she hasn't one in almost 2 years.       HPI AARIAH GODETTE 78 y.o. -female   Moderate COPD follow-up: Doing well.  She feels symptoms are baseline and well-controlled.  Takes Anoro daily.  Takes Arnuity at night but occasionally will not take it.  Is up-to-date with the flu shot and  pneumonia vaccines.  Lung cancer surveillance: Last CT scan of the chest October 2018 without tumor recurrence.  She somewhat thinks it has been 2 years since she had a previous scan.  She is asking for repeat scan.  No other new issues   OV 05/23/2019  Subjective:  Patient ID: Ovidio Hanger, female , DOB: 05-02-1941 , age 38 y.o. , MRN: [id number] , ADDRESS: [place] [id number]   05/23/2019 -   Chief Complaint  Patient presents with  . Moderate COPD (chronic obstructive pulmonary disease)    Has noticed  she is not able to work in yard as long as she could last year. But feels her medications are still working. Using the ProAir more than she used to.   -  Gold stage II COPD follow-up -Lung cancer screening follow-up  HPI Drina Shari Natt 78 y.o. -presents for follow-up.  It is almost 1 year since I saw her.  She tells me in March 2020 she had influenza and pneumonia.  Since then she has had a deterioration in her symptoms.  She is not able to do yard work as much.  She is on Anoro and Arnuity and she is asking for something stronger.  Looking at his COPD CAT score it appears she is stable actually.  She is not on oxygen.  Her last CT scan was in November 2019 without any recurrence of lung cancer.  After that she did have cardiac stress test and this was normal.  She is neither on night oxygen or day oxygen.  She is interested in an inhaler upgrade.  She is also open to the idea of pulmonary rehabilitation.  She also tells me that it has been many years since she had pulmonary function test and she is open to getting this retested as well as testing her oxygen levels.   IMPRESSION: 1. No evidence of recurrent or metastatic disease. 2. Cholelithiasis. 3. Aortic atherosclerosis (ICD10-170.0). Three-vessel coronary artery calcification. 4. Enlarged pulmonary arteries, indicative of pulmonary arterial hypertension. 5.  Emphysema (ICD10-J43.9).    Electronically Signed   By: Lorin Picket M.D.   On: 06/19/2018 11:34   OV 06/18/2019  Subjective:  Patient ID: Ovidio Hanger, female , DOB: 03/31/1941 , age 78 y.o. , MRN: [id number] , ADDRESS: [place] [address]   06/18/2019 -   Chief Complaint  Patient presents with  . Follow-up    PFT performed 06/11/2019 and is here today to go over the results. Pt said that she has had some more SOB which is worse when she works outside and also has a dry cough.   Moderate COPD follow-up  HPI Anikka Marsan 78 y.o. -this visit is to follow-up for test results of overnight desaturation study, pulmonary function test and CT scan of the chest.  This is because she has moderate COPD and she started feeling worse.  Partly because she was not attending pulmonary rehabilitation in the midst of the pandemic.  [facility]  In addition last visit we switched her to Trelegy.  With this her symptoms are better subjectively although on the CAT score they seem to be a little bit worse.  She is liking Trelegy daily.  She is now using nighttime oxygen after oxygen and nighttime desaturation test was positive.  She has been using this for 1 week.  She does not notice any difference.  She is not sure she wants to take this.  Her last walking desaturation test was some years ago according to her history.  She is asking about other medications for COPD.  We discussed chronic daily prednisone and Roflumilast and decided against this.  This because she is not a frequent exacerbate her.  She was supposed to have CT scan of the chest but this is scheduled for next week.  This visit got scheduled ahead of time.  Her pulmonary function test shows some mild age-related decline in FEV1 and DLCO but otherwise unremarkable.  Walking desaturation test today 185 feet x 3 laps  on room air: She got to the lowest pulse ox of 89% and quickly bounced back.  As she walked  more she will drop to 88%.  But she does not want to take portable oxygen with her.       CAT COPD Symptom & Quality of Life Score (GSK trademark) 0 is no burden. 5 is highest burden 01/12/2016  08/31/2017  05/23/2019  06/18/2019   Never Cough -> Cough all the time3 2 2 2 3   No phlegm in chest -> Chest is full 1of phlegm 2 1 2 1   No chest tightness -> Chest feels v0ery tight 1 0 0 0  No dyspnea for 1 flight stairs/hill -> V3ery dyspneic for 1 flight of stairs 4 3 4 3   No limitations for ADL at home -> Ver4y limited with ADL at home 2 3 2 4   Confident leaving home -> Not at all c1onfident leaving home 1 0 1 1  Sleep soundly -> Do not sleep soundly2 because of lung condition 3 2 1 2   Lots of Energy -> No energy at all 4 2 2 3   TOTAL Score (max 40)  19 13 14 17    Results for ACIRE, TANG (MRN [id number]) as of 06/18/2019 09:28  Ref. Range 09/10/2015 11:55 06/11/2019 08:33  FEV1-Pre Latest Units: L 1.14 1.04  FEV1-%Pred-Pre Latest Units: % 56 55   Results for JOCELYNN, GIOFFRE (MRN [id number]) as of 06/18/2019 09:28  Ref. Range 09/10/2015 11:55 06/11/2019 08:33  DLCO unc Latest Units: ml/min/mmHg 9.44 7.53  DLCO unc % pred Latest Units: % 43 42   ROS - per HPI      has a past medical history of Allergic rhinitis, Anemia, Arthritis, Bilateral cataracts, Bronchogenic lung cancer ([place]) (09/12/2011), Chicken pox, Cholelithiasis (05/21/2014), COPD (chronic obstructive pulmonary disease) ([place]), Depression, Gout, History of blood transfusion, Hot flashes, migraines (90's), Hyperlipidemia, Hypertension, IBS (irritable bowel syndrome), Liver cyst, Lung cancer ([place]), Macrocytosis, Migraine, Osteopenia, Pneumonia, PONV (postoperative nausea and vomiting), S/P right THA, AA - Dr. 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   reports that she quit smoking about 7 years ago. Her smoking use included cigarettes. She has a 25.00 pack-year smoking  history. She has never used smokeless tobacco.  Past Surgical History:  Procedure Laterality Date  . CARPAL TUNNEL RELEASE  80's   right   . COLONOSCOPY    . cyst removed  >67yrs ago   from left leg  . DILATION AND CURETTAGE OF UTERUS     at age 59  . EXCISIONAL TOTAL KNEE ARTHROPLASTY Right 06/03/2014   Procedure: RIGHT KNEE SCAR EXCISION;  Surgeon: [name], MD;  Location: [facility];  Service: Orthopedics;  Laterality: Right;  . INGUINAL HERNIA REPAIR  2000  . ROTATOR CUFF REPAIR  2001   right  . TONSILLECTOMY  as a child   and adenoids  . TOTAL ABDOMINAL HYSTERECTOMY  2003   benign tumor-[facility]  . TOTAL HIP ARTHROPLASTY  2011   left  . TOTAL HIP ARTHROPLASTY  03/07/2012   Procedure: TOTAL HIP ARTHROPLASTY ANTERIOR APPROACH;  Surgeon: [name], MD;  Location: [facility];  Service: Orthopedics;  Laterality: Right;  . TOTAL KNEE ARTHROPLASTY Right 01/02/2013   Procedure: RIGHT TOTAL KNEE ARTHROPLASTY;  Surgeon: [name], MD;  Location: [facility];  Service: Orthopedics;  Laterality: Right;  . TOTAL KNEE ARTHROPLASTY Left 04/03/2013   Procedure: LEFT TOTAL KNEE ARTHROPLASTY;  Surgeon: [name],  MD;  Location: [facility];  Service: Orthopedics;  Laterality: Left;  Marland Kitchen VIDEO BRONCHOSCOPY  10/20/2011   Procedure: VIDEO BRONCHOSCOPY;  Surgeon: Grace Isaac, MD;  Location: [facility];  Service: Thoracic;  Laterality: N/A;  . WEDGE RESECTION  10-20-2011   rt lung  - for lung cancer    Allergies  Allergen Reactions  . Clindamycin/Lincomycin Nausea And Vomiting  . Penicillins Rash       . Spiriva [Tiotropium Bromide Monohydrate]     General intolerance  . Sulfa Antibiotics Hives and Rash    Severe Hives with skin reaction    Immunization History  Administered Date(s) Administered  . Fluad Quad(high Dose 65+) 04/10/2019  . Influenza Split 06/02/2011, 05/09/2012  . Influenza, High Dose Seasonal PF 04/19/2016, 04/27/2017, 05/18/2018  . Influenza,inj,Quad PF,6+ Mos  05/18/2013, 04/17/2014, 05/21/2015  . Pneumococcal Conjugate-13 04/05/2014  . Pneumococcal Polysaccharide-23 08/09/1998, 09/21/2011  . Tdap 06/22/2012  . Zoster 08/10/2007    Family History  Problem Relation Age of Onset  . Other Mother        polyarteritis  . Bowel Disease Father 24       colectomy/bowel obstruction issues  . Heart attack Father   . Hypertension Brother   . Arthritis Brother   . Post-traumatic stress disorder Brother   . Alcohol abuse Other   . Depression Other   . Hearing loss Other   . Heart disease Other   . Hypertension Other   . Anesthesia problems Neg Hx   . Hypotension Neg Hx   . Malignant hyperthermia Neg Hx   . Pseudochol deficiency Neg Hx   . Breast cancer Neg Hx      Current Outpatient Medications:  .  Acetaminophen (TYLENOL PO), Take by mouth as needed. Alternates with Ibuprofen, Disp: , Rfl:  .  amLODipine (NORVASC) 5 MG tablet, TAKE 1 TABLET BY MOUTH AT BEDTIME, Disp: 90 tablet, Rfl: 0 .  Cholecalciferol (VITAMIN D-3 PO), Take 1,200 mg by mouth every morning., Disp: , Rfl:  .  Cyanocobalamin (VITAMIN B-12 PO), Take by mouth., Disp: , Rfl:  .  FLUoxetine (PROZAC) 20 MG capsule, Take 1 capsule (20 mg total) by mouth 2 (two) times daily., Disp: 180 capsule, Rfl: 1 .  fluticasone (FLONASE) 50 MCG/ACT nasal spray, Place 2 sprays into both nostrils daily. (Patient taking differently: Place 2 sprays into both nostrils daily as needed. ), Disp: 16 g, Rfl: 5 .  Fluticasone-Umeclidin-Vilant (TRELEGY ELLIPTA) 100-62.5-25 MCG/INH AEPB, Inhale 1 puff into the lungs daily., Disp: 60 each, Rfl: 5 .  Glucosamine 500 MG CAPS, Take 1,200 mg by mouth 2 (two) times daily., Disp: , Rfl:  .  PROAIR HFA 108 (90 Base) MCG/ACT inhaler, INHALE 2 PUFFS BY MOUTH EVERY 6 HOURS AS NEEDED FOR WHEEZING OR SHORTNESS OF BREATH, Disp: 1 Inhaler, Rfl: 1 .  Probiotic Product (PROBIOTIC DAILY PO), Take by mouth daily. IB Guard, Disp: , Rfl:  .  rosuvastatin (CRESTOR) 5 MG tablet,  Take 1 tablet by mouth once daily, Disp: 90 tablet, Rfl: 0 .  sodium chloride (OCEAN) 0.65 % SOLN nasal spray, Place 1 spray into both nostrils daily as needed for congestion., Disp: , Rfl:  .  vitamin E 400 UNIT capsule, Take 400 Units by mouth daily., Disp: , Rfl:       Objective:   Vitals:   06/18/19 0920  BP: 124/80  Pulse: 71  SpO2: 96%  Weight: 124 lb 12.8 oz (56.6 kg)  Height: 5\' 2"  (1.575 m)  Estimated body mass index is 22.83 kg/m as calculated from the following:   Height as of this encounter: 5\' 2"  (1.575 m).   Weight as of this encounter: 124 lb 12.8 oz (56.6 kg).  @WEIGHTCHANGE @  [employer]   06/18/19 0920  Weight: 124 lb 12.8 oz (56.6 kg)     Physical Exam  General Appearance:    Alert, cooperative, no distress, appears stated age - yes , Deconditioned looking - no , OBESE  - no, Sitting on Wheelchair -  no  Head:    Normocephalic, without obvious abnormality, atraumatic  Eyes:    PERRL, conjunctiva/corneas clear,  Ears:    Normal TM's and external ear canals, both ears  Nose:   Nares normal, septum midline, mucosa normal, no drainage    or sinus tenderness. OXYGEN ON  - no . Patient is @ ra   Throat:   Lips, mucosa, and tongue normal; teeth and gums normal. Cyanosis on lips - no  Neck:   Supple, symmetrical, trachea midline, no adenopathy;    thyroid:  no enlargement/tenderness/nodules; no carotid   bruit or JVD  Back:     Symmetric, no curvature, ROM normal, no CVA tenderness  Lungs:     Distress - no , Wheeze no, Barrell Chest - no, Purse lip breathing - no, Crackles - no   Chest Wall:    No tenderness or deformity.    Heart:    Regular rate and rhythm, S1 and S2 normal, no rub   or gallop, Murmur - no  Breast Exam:    NOT DONE  Abdomen:     Soft, non-tender, bowel sounds active all four quadrants,    no masses, no organomegaly. Visceral obesity - no  Genitalia:   NOT DONE  Rectal:   NOT DONE  Extremities:   Extremities - normal, Has Cane -  YES, Clubbing - no, Edema - no  Pulses:   2+ and symmetric all extremities  Skin:   Stigmata of Connective Tissue Disease - no  Lymph nodes:   Cervical, supraclavicular, and axillary nodes normal  Psychiatric:  Neurologic:   Pleasant - yes, Anxious - no, Flat affect - no  CAm-ICU - neg, Alert and Oriented x 3 - yes, Moves all 4s - yes, Speech - normal, Cognition - intact           Assessment:       ICD-10-CM   1. Moderate COPD (chronic obstructive pulmonary disease), Sees Dr. Chase Caller, Pulm  J44.9   2. Nocturnal hypoxia  G47.34        Plan:     Patient Instructions  Moderate COPD (chronic obstructive pulmonary disease), Sees Dr. Chase Caller, [facility] Encounter for follow-up surveillance of lung cancer   COPD -  - this is stable but he has some age-related decline in lung function there is associated with mild desaturations at night. And drops in walking desaturation that is borderline for o2 qualification -Glad Trelegy is working better for you.  Continue thisly  - Use albuterol as needed. -Continue nighttime oxygen that he started 1 week ago;  -In a few weeks to few months if this is not helpful with daytime shortness of breath or energy levels we can stop this  -Do not recommend Roflumilast or chronic daily prednisone at this point due to lack of repeated COPD exacerbations [prednisone should be of last resort and COPD]  -Glad to start working out  Lung cancer surveillance:  -  Last CT scan was  actually 1 year ago in Nov 2019  - .  Repeat CT scan of the chest without contrast after 06/24/2019 -already scheduled  Follow-up We will follow up with a CT scan chest results Otherwise 6 months or sooner if needed; CAT score at follow-up and repeat walk test at followup      SIGNATURE    Dr. Brand Males, M.D., F.C.C.P,  Pulmonary and Critical Care Medicine Staff Physician, [place] Director - Interstitial Lung Disease  Program  Pulmonary [place] [facility] [place], [place], [address]  Pager: [phone], If no answer or between  15:00h - 7:00h: call [phone] Telephone: [phone]  9:42 AM 06/18/2019

## 2019-06-19 ENCOUNTER — Other Ambulatory Visit: Payer: Self-pay

## 2019-06-19 ENCOUNTER — Emergency Department (HOSPITAL_COMMUNITY): Payer: Medicare Other

## 2019-06-19 ENCOUNTER — Encounter (HOSPITAL_COMMUNITY): Payer: Self-pay

## 2019-06-19 ENCOUNTER — Emergency Department (HOSPITAL_COMMUNITY)
Admission: EM | Admit: 2019-06-19 | Discharge: 2019-06-19 | Disposition: A | Discharge status: Home / Self Care | Payer: Medicare Other | Attending: Emergency Medicine | Admitting: Emergency Medicine

## 2019-06-19 DIAGNOSIS — Z79899 Other long term (current) drug therapy: Secondary | ICD-10-CM | POA: Diagnosis not present

## 2019-06-19 DIAGNOSIS — I10 Essential (primary) hypertension: Secondary | ICD-10-CM | POA: Diagnosis not present

## 2019-06-19 DIAGNOSIS — Y9259 Other trade areas as the place of occurrence of the external cause: Secondary | ICD-10-CM | POA: Diagnosis not present

## 2019-06-19 DIAGNOSIS — S6992XA Unspecified injury of left wrist, hand and finger(s), initial encounter: Secondary | ICD-10-CM | POA: Diagnosis not present

## 2019-06-19 DIAGNOSIS — J449 Chronic obstructive pulmonary disease, unspecified: Secondary | ICD-10-CM | POA: Insufficient documentation

## 2019-06-19 DIAGNOSIS — Z87891 Personal history of nicotine dependence: Secondary | ICD-10-CM | POA: Diagnosis not present

## 2019-06-19 DIAGNOSIS — S60212A Contusion of left wrist, initial encounter: Secondary | ICD-10-CM | POA: Insufficient documentation

## 2019-06-19 DIAGNOSIS — Z96653 Presence of artificial knee joint, bilateral: Secondary | ICD-10-CM | POA: Insufficient documentation

## 2019-06-19 DIAGNOSIS — S0990XA Unspecified injury of head, initial encounter: Secondary | ICD-10-CM | POA: Diagnosis not present

## 2019-06-19 DIAGNOSIS — Y9389 Activity, other specified: Secondary | ICD-10-CM | POA: Insufficient documentation

## 2019-06-19 DIAGNOSIS — Y999 Unspecified external cause status: Secondary | ICD-10-CM | POA: Diagnosis not present

## 2019-06-19 DIAGNOSIS — S0181XA Laceration without foreign body of other part of head, initial encounter: Secondary | ICD-10-CM

## 2019-06-19 DIAGNOSIS — M25532 Pain in left wrist: Secondary | ICD-10-CM | POA: Diagnosis not present

## 2019-06-19 DIAGNOSIS — Z96643 Presence of artificial hip joint, bilateral: Secondary | ICD-10-CM | POA: Insufficient documentation

## 2019-06-19 DIAGNOSIS — Z85118 Personal history of other malignant neoplasm of bronchus and lung: Secondary | ICD-10-CM | POA: Insufficient documentation

## 2019-06-19 DIAGNOSIS — M79642 Pain in left hand: Secondary | ICD-10-CM | POA: Diagnosis not present

## 2019-06-19 DIAGNOSIS — W19XXXA Unspecified fall, initial encounter: Secondary | ICD-10-CM | POA: Diagnosis not present

## 2019-06-19 NOTE — ED Notes (Signed)
ED Provider at bedside. 

## 2019-06-19 NOTE — ED Provider Notes (Signed)
[facility] DEPT Provider Note   CSN: [id number] Arrival date & time: 06/19/19  1040     History   Chief Complaint Chief Complaint  Patient presents with  . Fall    HPI [name] is a 78 y.o. female.     The history is provided by the patient.  Fall This is a new problem. The current episode started less than 1 hour ago. The problem has been resolved. Associated symptoms include headaches. Pertinent negatives include no chest pain, no abdominal pain and no shortness of breath. Associated symptoms comments: Left wrist/hand pain . Nothing aggravates the symptoms. Nothing relieves the symptoms. She has tried nothing for the symptoms. The treatment provided no relief.    Past Medical History:  Diagnosis Date  . Allergic rhinitis   . Anemia   . Arthritis    all over  . Bilateral cataracts   . Bronchogenic lung cancer ([place]) 09/12/2011    # Stage 1 lung cancer: pT1b, pN0 Stage 08 October 2011 - s/p lobectomy by Dr Servando Snare - surveillance by Dr Earlie Server - 05/15/13 - Clear CT. No recurrence - October 2015 - clear CT,No recurrence             -Oct 2016 >no recurrence   . Chicken pox   . Cholelithiasis 05/21/2014   On CT scan 2015   . COPD (chronic obstructive pulmonary disease) ([place])   . Depression    hx ocd and anxiety in record as well  . Gout    last time 6-67yrs ago   . History of blood transfusion   . Hot flashes    takes Prozac daily  . Hx of migraines 90's   after menopause HA stopped  . Hyperlipidemia    taking Flax Seed Oil and Fish Oil  . Hypertension    takes Amlodipine nightly  . IBS (irritable bowel syndrome)   . Liver cyst   . Lung cancer ([place])   . Macrocytosis   . Migraine    MIGRAINES RESOLVED WITH MENOPAUSE  . Osteopenia    takes Calcium and Vit D bid  . Pneumonia    x 2 ;last time back in 1999  . PONV (postoperative nausea and vomiting)   . S/P right THA, AA - Dr.  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 Seasonal allergies   . Smoking 09/12/2011    Patient Active Problem List   Diagnosis Date Noted  . Gallstones 02/12/2019  . Coronary artery calcification seen on CT scan 08/18/2018  . Hyperlipidemia 08/18/2018  . History of lung cancer 06/14/2018  . Osteopenia 06/14/2018  . Atherosclerosis of aorta ([place]) 05/24/2016  . Moderate COPD (chronic obstructive pulmonary disease), Sees Dr. Chase Caller, [facility] 05/21/2015  . Hypertension 06/22/2012    Past Surgical History:  Procedure Laterality Date  . CARPAL TUNNEL RELEASE  80's   right   . COLONOSCOPY    . cyst removed  >74yrs ago   from left leg  . DILATION AND CURETTAGE OF UTERUS     at age 19  . EXCISIONAL TOTAL KNEE ARTHROPLASTY Right 06/03/2014   Procedure: RIGHT KNEE SCAR EXCISION;  Surgeon: [name], MD;  Location: [facility];  Service: Orthopedics;  Laterality: Right;  . INGUINAL HERNIA REPAIR  2000  . ROTATOR CUFF REPAIR  2001   right  . TONSILLECTOMY  as a child   and adenoids  . TOTAL ABDOMINAL HYSTERECTOMY  2003   benign tumor-[facility]  .  TOTAL HIP ARTHROPLASTY  2011   left  . TOTAL HIP ARTHROPLASTY  03/07/2012   Procedure: TOTAL HIP ARTHROPLASTY ANTERIOR APPROACH;  Surgeon: [name], MD;  Location: [facility];  Service: Orthopedics;  Laterality: Right;  . TOTAL KNEE ARTHROPLASTY Right 01/02/2013   Procedure: RIGHT TOTAL KNEE ARTHROPLASTY;  Surgeon: [name], MD;  Location: [facility];  Service: Orthopedics;  Laterality: Right;  . TOTAL KNEE ARTHROPLASTY Left 04/03/2013   Procedure: LEFT TOTAL KNEE ARTHROPLASTY;  Surgeon: [name], MD;  Location: [facility];  Service: Orthopedics;  Laterality: Left;  Marland Kitchen VIDEO BRONCHOSCOPY  10/20/2011   Procedure: VIDEO BRONCHOSCOPY;  Surgeon: Grace Isaac, MD;  Location: [facility];  Service: Thoracic;  Laterality: N/A;  . WEDGE RESECTION  10-20-2011   rt lung  - for lung cancer     OB History   No obstetric history on file.       Home Medications    Prior to Admission medications   Medication Sig Start Date End Date Taking? Authorizing Provider  Acetaminophen (TYLENOL PO) Take by mouth as needed. Alternates with Ibuprofen    [provider]  amLODipine (NORVASC) 5 MG tablet TAKE 1 TABLET BY MOUTH AT BEDTIME 04/04/19   Koberlein, Junell C, MD  Cholecalciferol (VITAMIN D-3 PO) Take 1,200 mg by mouth every morning.    [provider]  Cyanocobalamin (VITAMIN B-12 PO) Take by mouth.    [provider]  FLUoxetine (PROZAC) 20 MG capsule Take 1 capsule (20 mg total) by mouth 2 (two) times daily. 02/19/19   Koberlein, Steele Berg, MD  fluticasone (FLONASE) 50 MCG/ACT nasal spray Place 2 sprays into both nostrils daily. Patient taking differently: Place 2 sprays into both nostrils daily as needed.  11/22/14   Brand Males, MD  Fluticasone-Umeclidin-Vilant (TRELEGY ELLIPTA) 100-62.5-25 MCG/INH AEPB Inhale 1 puff into the lungs daily. 06/05/19   Brand Males, MD  Glucosamine 500 MG CAPS Take 1,200 mg by mouth 2 (two) times daily.    [provider]  PROAIR HFA 108 (90 Base) MCG/ACT inhaler INHALE 2 PUFFS BY MOUTH EVERY 6 HOURS AS NEEDED FOR WHEEZING OR SHORTNESS OF BREATH 10/26/18   Brand Males, MD  Probiotic Product (PROBIOTIC DAILY PO) Take by mouth daily. IB Guard    [provider]  rosuvastatin (CRESTOR) 5 MG tablet Take 1 tablet by mouth once daily 04/04/19   Koberlein, Junell C, MD  sodium chloride (OCEAN) 0.65 % SOLN nasal spray Place 1 spray into both nostrils daily as needed for congestion.    [provider]  vitamin E 400 UNIT capsule Take 400 Units by mouth daily.    [provider]    Family History Family History  Problem Relation Age of Onset  . Other Mother        polyarteritis  . Bowel Disease Father 55       colectomy/bowel obstruction issues  . Heart attack Father   . Hypertension Brother   . Arthritis Brother   . Post-traumatic  stress disorder Brother   . Alcohol abuse Other   . Depression Other   . Hearing loss Other   . Heart disease Other   . Hypertension Other   . Anesthesia problems Neg Hx   . Hypotension Neg Hx   . Malignant hyperthermia Neg Hx   . Pseudochol deficiency Neg Hx   . Breast cancer Neg Hx     Social History Social History   Tobacco Use  . Smoking  status: Former Smoker    Packs/day: 0.50    Years: 50.00    Pack years: 25.00    Types: Cigarettes    Quit date: 09/23/2011    Years since quitting: 7.7  . Smokeless tobacco: Never Used  Substance Use Topics  . Alcohol use: Yes    Comment: 2 beers every night  . Drug use: No     Allergies   Clindamycin/lincomycin, Penicillins, Spiriva [tiotropium bromide monohydrate], and Sulfa antibiotics   Review of Systems Review of Systems  Constitutional: Negative for chills and fever.  HENT: Negative for ear pain and sore throat.   Eyes: Negative for pain and visual disturbance.  Respiratory: Negative for cough and shortness of breath.   Cardiovascular: Negative for chest pain and palpitations.  Gastrointestinal: Negative for abdominal pain and vomiting.  Genitourinary: Negative for dysuria and hematuria.  Musculoskeletal: Positive for arthralgias. Negative for back pain.  Skin: Positive for wound. Negative for color change and rash.  Neurological: Positive for headaches. Negative for seizures and syncope.  All other systems reviewed and are negative.    Physical Exam Updated Vital Signs  ED Triage Vitals  Enc Vitals Group     BP 06/19/19 1103 (!) 147/94     Pulse Rate 06/19/19 1103 65     Resp 06/19/19 1103 18     Temp 06/19/19 1103 97.8 F (36.6 C)     Temp Source 06/19/19 1103 Oral     SpO2 06/19/19 1103 92 %     Weight 06/19/19 1104 125 lb (56.7 kg)     Height --      Head Circumference --      Peak Flow --      Pain Score 06/19/19 1104 4     Pain Loc --      Pain Edu? --      Excl. in [place]? --     Physical Exam  Vitals signs and nursing note reviewed.  Constitutional:      General: She is not in acute distress.    Appearance: She is well-developed. She is not ill-appearing.  HENT:     Right Ear: Tympanic membrane normal.     Left Ear: Tympanic membrane normal.     Nose: Nose normal.     Mouth/Throat:     Mouth: Mucous membranes are moist.  Eyes:     Extraocular Movements: Extraocular movements intact.     Conjunctiva/sclera: Conjunctivae normal.     Pupils: Pupils are equal, round, and reactive to light.  Neck:     Musculoskeletal: Normal range of motion and neck supple. No muscular tenderness.  Cardiovascular:     Rate and Rhythm: Normal rate and regular rhythm.     Pulses: Normal pulses.     Heart sounds: Normal heart sounds. No murmur.  Pulmonary:     Effort: Pulmonary effort is normal. No respiratory distress.     Breath sounds: Normal breath sounds.  Abdominal:     General: Abdomen is flat. There is no distension.     Palpations: Abdomen is soft.     Tenderness: There is no abdominal tenderness.  Musculoskeletal:        General: No tenderness.     Comments: No midline spinal tenderess in C/T/L  Skin:    General: Skin is warm and dry.     Capillary Refill: Capillary refill takes less than 2 seconds.     Comments: 1 cm laceration/abrasion to left forehead  Neurological:  General: No focal deficit present.     Mental Status: She is alert and oriented to person, place, and time.     Cranial Nerves: No cranial nerve deficit.     Sensory: No sensory deficit.     Motor: No weakness.     Coordination: Coordination normal.     Comments: 5+/5 strength, normal sensation  Psychiatric:        Mood and Affect: Mood normal.      ED Treatments / Results  Labs (all labs ordered are listed, but only abnormal results are displayed) Labs Reviewed - No data to display  EKG None  Radiology Dg Wrist Complete Left  Result Date: 06/19/2019 CLINICAL DATA:  Fall, pain EXAM: LEFT  WRIST - COMPLETE 3+ VIEW; LEFT HAND - COMPLETE 3+ VIEW COMPARISON:  None. FINDINGS: There is anatomic alignment. No acute fracture. Degenerative changes are present particularly at first greater than second carpometacarpal joints, proximal and distal interphalangeal joints, and triscaphe joint. Decreased osseous mineralization. IMPRESSION: No acute fracture.  Osteoarthritis. Electronically Signed   By: Macy Mis M.D.   On: 06/19/2019 11:42   Ct Head Wo Contrast  Result Date: 06/19/2019 CLINICAL DATA:  Minor head trauma EXAM: CT HEAD WITHOUT CONTRAST TECHNIQUE: Contiguous axial images were obtained from the base of the skull through the vertex without intravenous contrast. COMPARISON:  10/06/2011 FINDINGS: Brain: No evidence of acute infarction, hemorrhage, extra-axial collection, ventriculomegaly, or mass effect. Generalized cerebral atrophy. Periventricular white matter low attenuation likely secondary to microangiopathy. Vascular: Cerebrovascular atherosclerotic calcifications are noted. Skull: Negative for fracture or focal lesion. Sinuses/Orbits: Visualized portions of the orbits are unremarkable. Visualized portions of the paranasal sinuses and mastoid air cells are unremarkable. Other: None. IMPRESSION: No acute intracranial pathology. Electronically Signed   By: Kathreen Devoid   On: 06/19/2019 11:46   Dg Hand Complete Left  Result Date: 06/19/2019 CLINICAL DATA:  Fall, pain EXAM: LEFT WRIST - COMPLETE 3+ VIEW; LEFT HAND - COMPLETE 3+ VIEW COMPARISON:  None. FINDINGS: There is anatomic alignment. No acute fracture. Degenerative changes are present particularly at first greater than second carpometacarpal joints, proximal and distal interphalangeal joints, and triscaphe joint. Decreased osseous mineralization. IMPRESSION: No acute fracture.  Osteoarthritis. Electronically Signed   By: Macy Mis M.D.   On: 06/19/2019 11:42    Procedures .Marland KitchenLaceration Repair  Date/Time: 06/19/2019 11:22 AM  Performed by: Lennice Sites, DO Authorized by: Lennice Sites, DO   Consent:    Consent obtained:  Verbal   Consent given by:  Patient   Risks discussed:  Infection, poor cosmetic result, pain, need for additional repair, poor wound healing and vascular damage   Alternatives discussed:  No treatment Anesthesia (see MAR for exact dosages):    Anesthesia method:  None Laceration details:    Location:  Face   Face location:  Forehead   Length (cm):  1   Depth (mm):  1 Repair type:    Repair type:  Simple Pre-procedure details:    Preparation:  Patient was prepped and draped in usual sterile fashion Exploration:    Wound extent: no areolar tissue violation noted, no fascia violation noted, no foreign bodies/material noted, no muscle damage noted, no nerve damage noted, no tendon damage noted and no vascular damage noted     Contaminated: no   Treatment:    Area cleansed with:  Saline   Amount of cleaning:  Standard   Irrigation solution:  Sterile saline   Irrigation method:  Pressure wash  Visualized foreign bodies/material removed: no   Skin repair:    Repair method:  Steri-Strips and tissue adhesive   Number of Steri-Strips:  2 Approximation:    Approximation:  Close Post-procedure details:    Dressing:  Open (no dressing)   Patient tolerance of procedure:  Tolerated well, no immediate complications   (including critical care time)  Medications Ordered in ED Medications - No data to display   Initial Impression / Assessment and Plan / ED Course  I have reviewed the triage vital signs and the nursing notes.  Pertinent labs & imaging results that were available during my care of the patient were reviewed by me and considered in my medical decision making (see chart for details).        Maizy Tiahna Cure is a 78 year old female with history of hypertension, high cholesterol, COPD who presents the ED after mechanical fall.  Patient with normal vitals.  No fever.   Patient tripped while walking into the gym today.  Has a small skin abrasion to the left forehead.  Looks more like an avulsion/tiny laceration.  Was repaired with Dermabond and Steri-Strips.  Did not lose consciousness.  Not on blood thinners.  Head CT showed no signs of injury.  Has some left wrist pain and hand pain.  X-ray shows no acute fracture or malalignment.  Was placed in a wrist splint as likely with a wrist splint.  Has small suspicion for scaphoid fracture but can follow-up in a week with her primary care doctor for repeat examination and possible x-ray.  We have discussed this.  Recommend Tylenol, ice.  Given return precautions.   This chart was dictated using voice recognition software.  Despite best efforts to proofread,  errors can occur which can change the documentation meaning.    Final Clinical Impressions(s) / ED Diagnoses   Final diagnoses:  Laceration of forehead, initial encounter  Contusion of left wrist, initial encounter    ED Discharge Orders    None       Lennice Sites, DO 06/19/19 1217

## 2019-06-19 NOTE — ED Triage Notes (Signed)
Pt arrives POV with complaints of a mechanical fall today at the [facility]. Pt endorses head pain and left wrist pain. Pt has lac to left forehead. Pt denies blood thinners. Pt is alert and oriented.

## 2019-06-25 ENCOUNTER — Other Ambulatory Visit: Payer: Self-pay

## 2019-06-25 ENCOUNTER — Ambulatory Visit (INDEPENDENT_AMBULATORY_CARE_PROVIDER_SITE_OTHER)
Admission: RE | Admit: 2019-06-25 | Discharge: 2019-06-25 | Disposition: A | Discharge status: Home / Self Care | Payer: Medicare Other | Source: Ambulatory Visit | Attending: Internal Medicine | Admitting: Internal Medicine

## 2019-06-25 ENCOUNTER — Telehealth: Payer: Self-pay | Admitting: *Deleted

## 2019-06-25 DIAGNOSIS — Z08 Encounter for follow-up examination after completed treatment for malignant neoplasm: Secondary | ICD-10-CM

## 2019-06-25 DIAGNOSIS — J441 Chronic obstructive pulmonary disease with (acute) exacerbation: Secondary | ICD-10-CM | POA: Diagnosis not present

## 2019-06-25 DIAGNOSIS — J449 Chronic obstructive pulmonary disease, unspecified: Secondary | ICD-10-CM

## 2019-06-25 DIAGNOSIS — J439 Emphysema, unspecified: Secondary | ICD-10-CM | POA: Diagnosis not present

## 2019-06-25 DIAGNOSIS — Z85118 Personal history of other malignant neoplasm of bronchus and lung: Secondary | ICD-10-CM

## 2019-06-25 NOTE — Telephone Encounter (Signed)
I called the pt and scheduled an appt for tomorrow with Dr Martinique to arrive at 10:45am.

## 2019-06-25 NOTE — Telephone Encounter (Signed)
Copied from [place] [id number]. Topic: General - Other >> Jun 25, 2019  3:09 PM Leward Quan A wrote: Reason for CRM: Patient called to say that she recently had a fall and hurt her hand and her face she was informed at ED to contact her PCP to have Xrays redone on her hand. First available appointment was 07/16/2019 per patient she need something sooner. Please call her at Ph# [phone]

## 2019-06-26 ENCOUNTER — Ambulatory Visit (INDEPENDENT_AMBULATORY_CARE_PROVIDER_SITE_OTHER): Payer: Medicare Other | Admitting: Family Medicine

## 2019-06-26 ENCOUNTER — Encounter: Payer: Self-pay | Admitting: Family Medicine

## 2019-06-26 ENCOUNTER — Ambulatory Visit (INDEPENDENT_AMBULATORY_CARE_PROVIDER_SITE_OTHER): Payer: Medicare Other

## 2019-06-26 VITALS — BP 130/80 | HR 86 | Temp 97.7°F | Resp 16 | Ht 62.0 in | Wt 125.6 lb

## 2019-06-26 DIAGNOSIS — S0180XD Unspecified open wound of other part of head, subsequent encounter: Secondary | ICD-10-CM | POA: Diagnosis not present

## 2019-06-26 DIAGNOSIS — G44319 Acute post-traumatic headache, not intractable: Secondary | ICD-10-CM

## 2019-06-26 DIAGNOSIS — W19XXXD Unspecified fall, subsequent encounter: Secondary | ICD-10-CM | POA: Diagnosis not present

## 2019-06-26 DIAGNOSIS — R600 Localized edema: Secondary | ICD-10-CM | POA: Diagnosis not present

## 2019-06-26 DIAGNOSIS — S6992XD Unspecified injury of left wrist, hand and finger(s), subsequent encounter: Secondary | ICD-10-CM

## 2019-06-26 DIAGNOSIS — M25532 Pain in left wrist: Secondary | ICD-10-CM | POA: Diagnosis not present

## 2019-06-26 NOTE — Progress Notes (Signed)
Chief Complaint  Patient presents with  . Follow-up    HPI: Ms.Artemis Takima Encina is a 78 y.o. female with history of hyperlipidemia, hypertension, osteopenia, and COPD, who is here today to follow on recent ER visit. She presented to the ER on 06/19/2019 after a fall when she was at the [facility]. She was wearing "thick rubber shoes" and one foot got stuck on concrete floor, lost balance and fell.  She landed on left side, his left frontal aspect of head. Denies LOC but felt "foggy" for a few minutes.   Laceration left frontal,healing well.  She is having left-hemi-craneal sore headache, constant,exacerbated by certain positions and when lying on left side.   No associated fever,chills,visual changes , nausea,vomiting, MS changes,or focal deficit.  Head CT: No acute intracranial pathology.  Left wrist pain, edema,and limitation of ROM. Pain is exacerbated by movement and alleviated by rest and by wearing a wrist splint.  Left wrist and hand x-ray was negative for acute fracture. Still having severe left wrist pain, limitation of range of motion, and edema. She has not noted numbness, tingling, or cold extremity.  She is taking Tylenol and Aleve for pain management. She had 2 tablets of oxycodone left from old prescription, she took one half of the tablet and it did help.  Review of Systems  Constitutional: Negative for appetite change and fatigue.  HENT: Negative for facial swelling and sore throat.   Respiratory: Negative for cough, shortness of breath and wheezing.   Gastrointestinal: Negative for abdominal pain.       No changes in bowel movements.  Musculoskeletal: Negative for gait problem and neck pain.  Psychiatric/Behavioral: Negative for confusion and hallucinations.  Rest see pertinent positives and negatives per HPI.   Current Outpatient Medications on File Prior to Visit  Medication Sig Dispense Refill  . Acetaminophen (TYLENOL PO) Take by mouth as  needed. Alternates with Ibuprofen    . amLODipine (NORVASC) 5 MG tablet TAKE 1 TABLET BY MOUTH AT BEDTIME 90 tablet 0  . Cholecalciferol (VITAMIN D-3 PO) Take 1,200 mg by mouth every morning.    . Cyanocobalamin (VITAMIN B-12 PO) Take by mouth.    Marland Kitchen FLUoxetine (PROZAC) 20 MG capsule Take 1 capsule (20 mg total) by mouth 2 (two) times daily. 180 capsule 1  . fluticasone (FLONASE) 50 MCG/ACT nasal spray Place 2 sprays into both nostrils daily. (Patient taking differently: Place 2 sprays into both nostrils daily as needed. ) 16 g 5  . Fluticasone-Umeclidin-Vilant (TRELEGY ELLIPTA) 100-62.5-25 MCG/INH AEPB Inhale 1 puff into the lungs daily. 60 each 5  . Glucosamine 500 MG CAPS Take 1,200 mg by mouth 2 (two) times daily.    Marland Kitchen PROAIR HFA 108 (90 Base) MCG/ACT inhaler INHALE 2 PUFFS BY MOUTH EVERY 6 HOURS AS NEEDED FOR WHEEZING OR SHORTNESS OF BREATH 1 Inhaler 1  . Probiotic Product (PROBIOTIC DAILY PO) Take by mouth daily. IB Guard    . rosuvastatin (CRESTOR) 5 MG tablet Take 1 tablet by mouth once daily 90 tablet 0  . sodium chloride (OCEAN) 0.65 % SOLN nasal spray Place 1 spray into both nostrils daily as needed for congestion.    . vitamin E 400 UNIT capsule Take 400 Units by mouth daily.     No current facility-administered medications on file prior to visit.      Past Medical History:  Diagnosis Date  . Allergic rhinitis   . Anemia   . Arthritis    all over  .  Bilateral cataracts   . Bronchogenic lung cancer ([place]) 09/12/2011    # Stage 1 lung cancer: pT1b, pN0 Stage 08 October 2011 - s/p lobectomy by Dr Servando Snare - surveillance by Dr Earlie Server - 05/15/13 - Clear CT. No recurrence - October 2015 - clear CT,No recurrence             -Oct 2016 >no recurrence   . Chicken pox   . Cholelithiasis 05/21/2014   On CT scan 2015   . COPD (chronic obstructive pulmonary disease) ([place])   . Depression    hx ocd and anxiety in record as well  . Gout    last  time 6-46yrs ago   . History of blood transfusion   . Hot flashes    takes Prozac daily  . Hx of migraines 90's   after menopause HA stopped  . Hyperlipidemia    taking Flax Seed Oil and Fish Oil  . Hypertension    takes Amlodipine nightly  . IBS (irritable bowel syndrome)   . Liver cyst   . Lung cancer ([place])   . Macrocytosis   . Migraine    MIGRAINES RESOLVED WITH MENOPAUSE  . Osteopenia    takes Calcium and Vit D bid  . Pneumonia    x 2 ;last time back in 1999  . PONV (postoperative nausea and vomiting)   . 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 Seasonal allergies   . Smoking 09/12/2011   Allergies  Allergen Reactions  . Clindamycin/Lincomycin Nausea And Vomiting  . Penicillins Rash       . Spiriva [Tiotropium Bromide Monohydrate]     General intolerance  . Sulfa Antibiotics Hives and Rash    Severe Hives with skin reaction    Social History   Socioeconomic History  . Marital status: Married    Spouse name: Not on file  . Number of children: Not on file  . Years of education: Not on file  . Highest education level: Not on file  Occupational History  . Occupation: retired  Scientific laboratory technician  . Financial resource strain: Not on file  . Food insecurity    Worry: Not on file    Inability: Not on file  . Transportation needs    Medical: Not on file    Non-medical: Not on file  Tobacco Use  . Smoking status: Former Smoker    Packs/day: 0.50    Years: 50.00    Pack years: 25.00    Types: Cigarettes    Quit date: 09/23/2011    Years since quitting: 7.7  . Smokeless tobacco: Never Used  Substance and Sexual Activity  . Alcohol use: Yes    Comment: 2 beers every night  . Drug use: No  . Sexual activity: Never  Lifestyle  . Physical activity    Days per week: Not on file    Minutes per session: Not on file  . Stress: Not on file  Relationships  . Social Herbalist on phone: Not on file    Gets together: Not on  file    Attends religious service: Not on file    Active member of club or organization: Not on file    Attends meetings of clubs or organizations: Not on file    Relationship status: Not on file  Other Topics Concern  . Not on file  Social History Narrative  . Not on file  Vitals:   06/26/19 1036  BP: 130/80  Pulse: 86  Resp: 16  Temp: 97.7 F (36.5 C)  SpO2: 96%   Body mass index is 22.97 kg/m.   Physical Exam  Nursing note and vitals reviewed. Constitutional: She is oriented to person, place, and time. She appears well-developed and well-nourished. No distress.  HENT:  Head: Normocephalic and atraumatic.  Eyes: Pupils are equal, round, and reactive to light. Conjunctivae and EOM are normal.  Cardiovascular: Normal rate and regular rhythm.  No murmur heard. Pulses:      Radial pulses are 2+ on the left side.  Good capillary refill.  Respiratory: Effort normal and breath sounds normal. No respiratory distress.  GI: There is no hepatomegaly.  Musculoskeletal:        General: No edema.     Left wrist: She exhibits decreased range of motion, tenderness, bony tenderness and swelling. She exhibits no crepitus and no laceration.     Comments: Tenderness upon palpation of ulnar and radial styloid. + Edema. Ecchymoses on dorsum of left wrist extending to hand and IP joints.   Neurological: She is alert and oriented to person, place, and time. She has normal strength. No cranial nerve deficit. Gait normal.  Skin: Skin is warm. Ecchymosis noted. No rash noted. No erythema.  Linear closed wound left fronto temporal. Left upper palpebral ecchymosis extending to left frontal and temporal area. Left parietal hematoma, mildly tender.   Psychiatric: She has a normal mood and affect.  Well groomed, good eye contact.   ASSESSMENT AND PLAN:  Ms. Reda was seen today for follow-up.  Orders Placed This Encounter  Procedures  . DG Wrist Complete Left   Injury of left wrist,  subsequent encounter Bony tenderness on examination and edema. Wrist X ray ordered today: ?distal radial fracture. Pending formal report. Left hand elevation. Instructed about warning signs.  -     DG Wrist Complete Left  Fall, subsequent encounter Fall precautions discussed. At this time I do not think PT is necessary.  Open wound of forehead, subsequent encounter Healing well. Continue monitoring for signs of infection. Keep it clean with soap and water. Instructed about warning signs.  Acute post-traumatic headache, not intractable Headache has been a stable since head trauma occurred. I do not think we need to repeat head imaging at this time. Instructed to monitor for worsening headache, MS changes, focal weakness among some. Continue Tylenol 500 mg 3-4 times per day as needed.   [name] G. Martinique, MD  [facility]. [place] office.

## 2019-06-26 NOTE — Patient Instructions (Addendum)
A few things to remember from today's visit:   Injury of left wrist, subsequent encounter - Plan: DG Wrist Complete Left  Fall, subsequent encounter   Understanding Your Risk for Falls Each year, millions of people suffer serious injuries from falls. It is important to understand your risk for falling. Talk with your health care provider about your risk and what you can do to lower it. There are actions you can take at home to lower your risk. If you do have a serious fall, it is important to tell your health care provider. Falling once raises your risk for falling again. How can falls affect me? Serious injuries from falls are common. These include:  Broken bones. Most hip fractures are caused by falls.  Traumatic brain injury (TBI). Falls are the most common cause of TBI. Fear of falling can also cause you to avoid activities and stay at home. This can make your muscles weaker and actually raise your risk for a fall. What can increase my risk? Serious injuries from a fall most often happen to people older than age 58. Children and young adults ages 16-29 are also at higher risk. The more risk factors you have for falling, the higher your risk. Risk factors include:  Weakness in the lower body.  Lack (deficiency) of vitamin D.  Weak bones (osteoporosis).  Being generally weak or confused due to long-term (chronic) illness.  Dizziness or balance problems.  Poor vision.  Having depression.  Medicine that causes dizziness or drowsiness. These can include medicines for your blood pressure, heart, anxiety, insomnia, or edema, as well as pain medicines and muscle relaxants.  Drinking alcohol.  Foot pain or improper footwear.  Working at a dangerous job.  Having had a fall in the past.  Tripping hazards at home, such as floor clutter or loose rugs, or poor lighting.  Having pets or clutter in your home. What actions can I take to lower my risk of falling?      Maintain  physical fitness: ? Do strength and balance exercises. Consider taking a regular class to build strength and balance. Yoga and tai chi are good options. ? Have your eyes checked every year and your vision prescription updated as needed.  Remove all clutter from walkways and stairways, including extension cords.  Use a cordless phone.  Do not use throw rugs. Make sure all carpeting is taped or tacked down securely.  Use good lighting in all rooms. Keep a flashlight near your bed.  Make sure there is a clear path from your bed to the bathroom. Use night-lights.  Install grab bars for your tub, shower, and toilet. Use a bath mat in your tub or shower.  Attach secure railings on both sides of your stairs.  Repair uneven or broken steps.  Use a cane or walker as directed by your health care provider.  Wear nonskid shoes. Do not wear high heels. Do not walk around the house in socks or slippers.  Avoid walking on icy or slippery surfaces. Walk on the grass instead of on icy or slick sidewalks. Where you can, use ice melt to get rid of ice on walkways. Questions to ask your health care provider  Can you help me evaluate my risk for a fall?  Do any of my medicines make me more likely to fall?  Should I take a vitamin D supplement?  What exercises can I do to improve my strength and balance?  Should I make an appointment to  have my vision checked?  Do I need a bone density test to check for osteoporosis?  Would it help to use a cane or a walker? Where to find more information  Centers for Disease Control and Prevention, STEADI: StoreMirror.com.cy  Community-Based Fall Prevention Programs: StoreMirror.com.cy  [employer] on Aging: ToneConnect.com.ee Contact a health care provider if:  You fall at home.  You are afraid of falling at home.  You feel weak, drowsy, or dizzy at home. Summary  People 45 and older are at high risk for falling. However, older people are not the only ones  injured in falls. Children and young adults have a higher-than-normal risk, too.  Talk with your health care provider about your risks for falling and how to lower those risks.  Taking certain precautions at home can lower your risk for falling.  If you fall, always tell your health care provider. This information is not intended to replace advice given to you by your health care provider. Make sure you discuss any questions you have with your health care provider. Document Released: 03/09/2017 Document Revised: 10/25/2017 Document Reviewed: 03/09/2017 Elsevier Patient Education  [place]. Please be sure medication list is accurate. If a new problem present, please set up appointment sooner than planned today.

## 2019-06-28 DIAGNOSIS — S52502A Unspecified fracture of the lower end of left radius, initial encounter for closed fracture: Secondary | ICD-10-CM | POA: Diagnosis not present

## 2019-06-28 DIAGNOSIS — M25532 Pain in left wrist: Secondary | ICD-10-CM | POA: Diagnosis not present

## 2019-07-09 DIAGNOSIS — J449 Chronic obstructive pulmonary disease, unspecified: Secondary | ICD-10-CM | POA: Diagnosis not present

## 2019-07-09 DIAGNOSIS — R0600 Dyspnea, unspecified: Secondary | ICD-10-CM | POA: Diagnosis not present

## 2019-07-09 DIAGNOSIS — J439 Emphysema, unspecified: Secondary | ICD-10-CM | POA: Diagnosis not present

## 2019-07-11 ENCOUNTER — Other Ambulatory Visit: Payer: Self-pay | Admitting: Family Medicine

## 2019-07-11 ENCOUNTER — Telehealth: Payer: Self-pay | Admitting: Internal Medicine

## 2019-07-11 NOTE — Telephone Encounter (Signed)
Call returned to patient, confirmed DOB, she states she is wanting a POC. Current DME is [facility]. She states her concentrator is loud, big, and takes up a lot of space. She states they left her tanks but did now show her how to use them. I explained to her those tanks should only be used during an emergency. In case the power goes out. I explained that if she is only  Using oxygen at night she should not need to refill any tanks. She was thankful for the explanation and asked that I be sure to let [employer] know they should be educating patients about the oxygen when they deliver it to the home instead of just dropping it off.   Nothing further needed at this time.

## 2019-07-11 NOTE — Telephone Encounter (Signed)
Last OV 06/26/19 Last refill 04/04/19 #90/0 Next OV not scheduled

## 2019-07-19 DIAGNOSIS — M25532 Pain in left wrist: Secondary | ICD-10-CM | POA: Diagnosis not present

## 2019-07-19 DIAGNOSIS — S52502D Unspecified fracture of the lower end of left radius, subsequent encounter for closed fracture with routine healing: Secondary | ICD-10-CM | POA: Diagnosis not present

## 2019-07-22 ENCOUNTER — Other Ambulatory Visit: Payer: Self-pay | Admitting: Family Medicine

## 2019-07-24 NOTE — Progress Notes (Signed)
No evidence of lung cancre recurrence on CT chst

## 2019-07-25 DIAGNOSIS — H34832 Tributary (branch) retinal vein occlusion, left eye, with macular edema: Secondary | ICD-10-CM | POA: Diagnosis not present

## 2019-07-25 DIAGNOSIS — H43812 Vitreous degeneration, left eye: Secondary | ICD-10-CM | POA: Diagnosis not present

## 2019-07-25 DIAGNOSIS — H43811 Vitreous degeneration, right eye: Secondary | ICD-10-CM | POA: Diagnosis not present

## 2019-07-25 DIAGNOSIS — H35042 Retinal micro-aneurysms, unspecified, left eye: Secondary | ICD-10-CM | POA: Diagnosis not present

## 2019-08-05 ENCOUNTER — Other Ambulatory Visit: Payer: Self-pay | Admitting: Family Medicine

## 2019-08-09 DIAGNOSIS — R0600 Dyspnea, unspecified: Secondary | ICD-10-CM | POA: Diagnosis not present

## 2019-08-09 DIAGNOSIS — J449 Chronic obstructive pulmonary disease, unspecified: Secondary | ICD-10-CM | POA: Diagnosis not present

## 2019-08-09 DIAGNOSIS — S52502D Unspecified fracture of the lower end of left radius, subsequent encounter for closed fracture with routine healing: Secondary | ICD-10-CM | POA: Diagnosis not present

## 2019-08-09 DIAGNOSIS — J439 Emphysema, unspecified: Secondary | ICD-10-CM | POA: Diagnosis not present

## 2019-08-17 ENCOUNTER — Other Ambulatory Visit: Payer: Self-pay | Admitting: Family Medicine

## 2019-08-31 ENCOUNTER — Other Ambulatory Visit: Payer: Self-pay | Admitting: Family Medicine

## 2019-09-06 ENCOUNTER — Ambulatory Visit: Payer: Medicare Other

## 2019-09-06 DIAGNOSIS — S52502D Unspecified fracture of the lower end of left radius, subsequent encounter for closed fracture with routine healing: Secondary | ICD-10-CM | POA: Diagnosis not present

## 2019-09-09 DIAGNOSIS — J439 Emphysema, unspecified: Secondary | ICD-10-CM | POA: Diagnosis not present

## 2019-09-09 DIAGNOSIS — R0600 Dyspnea, unspecified: Secondary | ICD-10-CM | POA: Diagnosis not present

## 2019-09-09 DIAGNOSIS — J449 Chronic obstructive pulmonary disease, unspecified: Secondary | ICD-10-CM | POA: Diagnosis not present

## 2019-09-15 ENCOUNTER — Ambulatory Visit: Payer: Medicare Other | Attending: Internal Medicine

## 2019-09-15 DIAGNOSIS — Z23 Encounter for immunization: Secondary | ICD-10-CM | POA: Insufficient documentation

## 2019-09-15 NOTE — Progress Notes (Signed)
   Covid-19 Vaccination Clinic  Name:  Alysson Geist    MRN: [id number] DOB: November 27, 1940  09/15/2019  Ms. Lowrimore was observed post Covid-19 immunization for 15 minutes without incidence. She was provided with Vaccine Information Sheet and instruction to access the V-Safe system.   Ms. Muhlbauer was instructed to call 911 with any severe reactions post vaccine: Marland Kitchen Difficulty breathing  . Swelling of your face and throat  . A fast heartbeat  . A bad rash all over your body  . Dizziness and weakness    Immunizations Administered    Name Date Dose VIS Date Route   Pfizer COVID-19 Vaccine 09/15/2019 12:01 PM 0.3 mL 07/20/2019 Intramuscular   Manufacturer: [place]   Lot: [id number]   [place]: [address]-7

## 2019-10-07 DIAGNOSIS — J449 Chronic obstructive pulmonary disease, unspecified: Secondary | ICD-10-CM | POA: Diagnosis not present

## 2019-10-07 DIAGNOSIS — R0600 Dyspnea, unspecified: Secondary | ICD-10-CM | POA: Diagnosis not present

## 2019-10-07 DIAGNOSIS — J439 Emphysema, unspecified: Secondary | ICD-10-CM | POA: Diagnosis not present

## 2019-10-10 ENCOUNTER — Ambulatory Visit: Payer: Medicare Other | Attending: Internal Medicine

## 2019-10-10 DIAGNOSIS — Z23 Encounter for immunization: Secondary | ICD-10-CM | POA: Insufficient documentation

## 2019-10-10 NOTE — Progress Notes (Signed)
   Covid-19 Vaccination Clinic  Name:  Lovie Zarling    MRN: [id number] DOB: 1940/12/12  10/10/2019  Ms. Pelphrey was observed post Covid-19 immunization for 15 minutes without incident. She was provided with Vaccine Information Sheet and instruction to access the V-Safe system.   Ms. Deweese was instructed to call 911 with any severe reactions post vaccine: Marland Kitchen Difficulty breathing  . Swelling of face and throat  . A fast heartbeat  . A bad rash all over body  . Dizziness and weakness   Immunizations Administered    Name Date Dose VIS Date Route   Pfizer COVID-19 Vaccine 10/10/2019  8:31 AM 0.3 mL 07/20/2019 Intramuscular   Manufacturer: [place]   Lot: [id number]   [place]: [id number]

## 2019-10-12 ENCOUNTER — Telehealth: Payer: Self-pay | Admitting: Internal Medicine

## 2019-10-12 DIAGNOSIS — Z96653 Presence of artificial knee joint, bilateral: Secondary | ICD-10-CM | POA: Diagnosis not present

## 2019-10-12 DIAGNOSIS — Z471 Aftercare following joint replacement surgery: Secondary | ICD-10-CM | POA: Diagnosis not present

## 2019-10-12 NOTE — Telephone Encounter (Signed)
I have placed the info from pt in MR's mail slot. He will be back in the office Monday 3/8. Will give this to him them. Will route encounter to myself to follow up on.

## 2019-10-18 NOTE — Telephone Encounter (Signed)
Raquel Sarna, please advise if you have this or still with MR. Thanks.

## 2019-10-18 NOTE — Telephone Encounter (Signed)
MR still has the letter that pt brought him to review.

## 2019-10-24 DIAGNOSIS — H34832 Tributary (branch) retinal vein occlusion, left eye, with macular edema: Secondary | ICD-10-CM | POA: Diagnosis not present

## 2019-10-24 DIAGNOSIS — H35352 Cystoid macular degeneration, left eye: Secondary | ICD-10-CM | POA: Diagnosis not present

## 2019-10-24 DIAGNOSIS — H35042 Retinal micro-aneurysms, unspecified, left eye: Secondary | ICD-10-CM | POA: Diagnosis not present

## 2019-10-24 DIAGNOSIS — H43812 Vitreous degeneration, left eye: Secondary | ICD-10-CM | POA: Diagnosis not present

## 2019-10-24 NOTE — Telephone Encounter (Signed)
Has the letter been completed for patient?  Thank you!

## 2019-10-26 NOTE — Telephone Encounter (Signed)
MR, please advise on this. This was a sheet of paper from pt that she wanted you to view and provide advise for her on.  Pt had handwritten her name and message on it for you in regards to what she was asking about.

## 2019-11-07 DIAGNOSIS — J449 Chronic obstructive pulmonary disease, unspecified: Secondary | ICD-10-CM | POA: Diagnosis not present

## 2019-11-07 DIAGNOSIS — R0600 Dyspnea, unspecified: Secondary | ICD-10-CM | POA: Diagnosis not present

## 2019-11-07 DIAGNOSIS — J439 Emphysema, unspecified: Secondary | ICD-10-CM | POA: Diagnosis not present

## 2019-11-07 NOTE — Telephone Encounter (Signed)
MR please advise. Thanks! 

## 2019-11-08 NOTE — Telephone Encounter (Signed)
I was in the office desk 2 days ago and did not see any handwritten note. Please advise. I can address it after November 19, 2019

## 2019-11-08 NOTE — Telephone Encounter (Signed)
I did locate the sheet of paper that was brought in by pt in MR's office. This has been replaced on MR's desk on keyboard so he can be able to see it when he returns to the office on 4/12.  Called and spoke with pt to update her. Routing to MR.

## 2019-11-19 NOTE — Telephone Encounter (Signed)
ATC patient unable to reach LM to call back office (x1)  

## 2019-11-19 NOTE — Telephone Encounter (Signed)
We do not do this valve. There are specific Inclusion/exclusion criteria >. If she is interested we can refer to [facility]

## 2019-11-20 NOTE — Telephone Encounter (Signed)
LMTCB x2 for pt 

## 2019-11-21 ENCOUNTER — Telehealth: Payer: Self-pay | Admitting: Internal Medicine

## 2019-11-21 NOTE — Telephone Encounter (Signed)
I spoke with pt and gave her message from MR. She states she will revisit this at the next OV in two weeks.  Nothing further is needed.    Brand Males, MD  Physician  Specialty:  Pulmonary Disease  Telephone Encounter     Signed  Encounter Date:  10/12/2019          Signed         Show:Clear all [x] Manual[] Template[] Copied  Added by: [x] Ramaswamy, Murali, MD  [] Hover for details We do not do this valve. There are specific Inclusion/exclusion criteria >. If she is interested we can refer to [facility] program

## 2019-11-22 NOTE — Telephone Encounter (Signed)
LMTCB x3 for pt. We have attempted to contact pt several times with no success or call back from pt. Per triage protocol, message will be closed.   

## 2019-11-28 ENCOUNTER — Other Ambulatory Visit: Payer: Self-pay | Admitting: Family Medicine

## 2019-12-03 ENCOUNTER — Ambulatory Visit (INDEPENDENT_AMBULATORY_CARE_PROVIDER_SITE_OTHER): Payer: Medicare Other | Admitting: Family Medicine

## 2019-12-03 ENCOUNTER — Other Ambulatory Visit: Payer: Self-pay

## 2019-12-03 ENCOUNTER — Encounter: Payer: Self-pay | Admitting: Family Medicine

## 2019-12-03 VITALS — BP 120/86 | HR 72 | Temp 97.7°F | Ht 62.0 in | Wt 122.7 lb

## 2019-12-03 DIAGNOSIS — Z1211 Encounter for screening for malignant neoplasm of colon: Secondary | ICD-10-CM

## 2019-12-03 DIAGNOSIS — E538 Deficiency of other specified B group vitamins: Secondary | ICD-10-CM | POA: Diagnosis not present

## 2019-12-03 DIAGNOSIS — J449 Chronic obstructive pulmonary disease, unspecified: Secondary | ICD-10-CM

## 2019-12-03 DIAGNOSIS — E559 Vitamin D deficiency, unspecified: Secondary | ICD-10-CM

## 2019-12-03 DIAGNOSIS — I10 Essential (primary) hypertension: Secondary | ICD-10-CM

## 2019-12-03 DIAGNOSIS — M8580 Other specified disorders of bone density and structure, unspecified site: Secondary | ICD-10-CM

## 2019-12-03 DIAGNOSIS — E782 Mixed hyperlipidemia: Secondary | ICD-10-CM | POA: Diagnosis not present

## 2019-12-03 DIAGNOSIS — R6889 Other general symptoms and signs: Secondary | ICD-10-CM

## 2019-12-03 DIAGNOSIS — K4090 Unilateral inguinal hernia, without obstruction or gangrene, not specified as recurrent: Secondary | ICD-10-CM

## 2019-12-03 LAB — CBC WITH DIFFERENTIAL/PLATELET
Basophils Absolute: 0.1 10*3/uL (ref 0.0–0.1)
Basophils Relative: 1.6 % (ref 0.0–3.0)
Eosinophils Absolute: 0.1 10*3/uL (ref 0.0–0.7)
Eosinophils Relative: 1.8 % (ref 0.0–5.0)
HCT: 40.6 % (ref 36.0–46.0)
Hemoglobin: 14.1 g/dL (ref 12.0–15.0)
Lymphocytes Relative: 17.1 % (ref 12.0–46.0)
Lymphs Abs: 1 10*3/uL (ref 0.7–4.0)
MCHC: 34.6 g/dL (ref 30.0–36.0)
MCV: 96.9 fl (ref 78.0–100.0)
Monocytes Absolute: 0.4 10*3/uL (ref 0.1–1.0)
Monocytes Relative: 7.5 % (ref 3.0–12.0)
Neutro Abs: 4.1 10*3/uL (ref 1.4–7.7)
Neutrophils Relative %: 72 % (ref 43.0–77.0)
Platelets: 181 10*3/uL (ref 150.0–400.0)
RBC: 4.19 Mil/uL (ref 3.87–5.11)
RDW: 12.4 % (ref 11.5–15.5)
WBC: 5.7 10*3/uL (ref 4.0–10.5)

## 2019-12-03 LAB — LIPID PANEL
Cholesterol: 150 mg/dL (ref 0–200)
HDL: 61.4 mg/dL (ref >39.00)
LDL Cholesterol: 67 mg/dL (ref 0–99)
NonHDL: 89.03
Total CHOL/HDL Ratio: 2
Triglycerides: 109 mg/dL (ref 0.0–149.0)
VLDL: 21.8 mg/dL (ref 0.0–40.0)

## 2019-12-03 LAB — COMPREHENSIVE METABOLIC PANEL
ALT: 13 U/L (ref 0–35)
AST: 16 U/L (ref 0–37)
Albumin: 4.4 g/dL (ref 3.5–5.2)
Alkaline Phosphatase: 57 U/L (ref 39–117)
BUN: 9 mg/dL (ref 6–23)
CO2: 29 mEq/L (ref 19–32)
Calcium: 9.3 mg/dL (ref 8.4–10.5)
Chloride: 99 mEq/L (ref 96–112)
Creatinine, Ser: 0.81 mg/dL (ref 0.40–1.20)
GFR: 68.65 mL/min (ref >60.00)
Glucose, Bld: 81 mg/dL (ref 70–99)
Potassium: 3.7 mEq/L (ref 3.5–5.1)
Sodium: 136 mEq/L (ref 135–145)
Total Bilirubin: 0.7 mg/dL (ref 0.2–1.2)
Total Protein: 6.3 g/dL (ref 6.0–8.3)

## 2019-12-03 LAB — T3, FREE: T3, Free: 2.7 pg/mL (ref 2.3–4.2)

## 2019-12-03 LAB — IBC + FERRITIN
Ferritin: 96.6 ng/mL (ref 10.0–291.0)
Iron: 115 ug/dL (ref 42–145)
Saturation Ratios: 40.7 % (ref 20.0–50.0)
Transferrin: 202 mg/dL — ABNORMAL LOW (ref 212.0–360.0)

## 2019-12-03 LAB — TSH: TSH: 2.66 u[IU]/mL (ref 0.35–4.50)

## 2019-12-03 LAB — VITAMIN B12: Vitamin B-12: 1137 pg/mL — ABNORMAL HIGH (ref 211–911)

## 2019-12-03 LAB — T4, FREE: Free T4: 1.01 ng/dL (ref 0.60–1.60)

## 2019-12-03 LAB — VITAMIN D 25 HYDROXY (VIT D DEFICIENCY, FRACTURES): VITD: 37.98 ng/mL (ref 30.00–100.00)

## 2019-12-03 NOTE — Progress Notes (Signed)
[name] DOB: Jul 28, 1941 Encounter date: 12/03/2019  This is a 79 y.o. female who presents with Chief Complaint  Patient presents with  . patient complains of feeling cold xyears  . Abdominal Pain    patient complains of left lower abominal area xyears    History of present illness: Last bloodwork 02/2019.   Has a hernia on the left side  Inguinal area. Had right side repaired 20 years ago. Doesn't bother her much. Just wanted someone else to know it was there. Not getting smaller, but now at least same size in last at least year. No injury. No problems with bowel movements.   Always cold. Fights over [employer]. Wears coat in swimming pool. Long time ago had anemia issues, but not recently. Last March had pneumonia and flu at same time and didn't gain that weight back.   HTN: doesn't usually check bp at home. Diastolic up from normal.  COPD: breathing has been doing ok. Can't walk far or work in the yard too [place]. Can do most of housework without breaks. Uses trilegy flonase, sometimes albuterol (2-3 times/week depending on what she is doing).    Allergies  Allergen Reactions  . Clindamycin/Lincomycin Nausea And Vomiting  . Penicillins Rash       . Spiriva [Tiotropium Bromide Monohydrate]     General intolerance  . Sulfa Antibiotics Hives and Rash    Severe Hives with skin reaction   Current Meds  Medication Sig  . Acetaminophen (TYLENOL PO) Take by mouth as needed. Alternates with Ibuprofen  . amLODipine (NORVASC) 5 MG tablet TAKE 1 TABLET BY MOUTH AT BEDTIME  . Cholecalciferol (VITAMIN D-3 PO) Take 1,200 mg by mouth every morning.  . Cyanocobalamin (VITAMIN B-12 PO) Take by mouth.  Marland Kitchen FLUoxetine (PROZAC) 20 MG capsule Take 1 capsule by mouth twice daily  . fluticasone (FLONASE) 50 MCG/ACT nasal spray Place 2 sprays into both nostrils daily. (Patient taking differently: Place 2 sprays into both nostrils daily as needed. )  . Fluticasone-Umeclidin-Vilant  (TRELEGY ELLIPTA) 100-62.5-25 MCG/INH AEPB Inhale 1 puff into the lungs daily.  . Glucosamine 500 MG CAPS Take 1,200 mg by mouth 2 (two) times daily.  Marland Kitchen PROAIR HFA 108 (90 Base) MCG/ACT inhaler INHALE 2 PUFFS BY MOUTH EVERY 6 HOURS AS NEEDED FOR WHEEZING OR SHORTNESS OF BREATH  . Probiotic Product (PROBIOTIC DAILY PO) Take by mouth daily. IB Guard  . rosuvastatin (CRESTOR) 5 MG tablet Take 1 tablet by mouth once daily  . sodium chloride (OCEAN) 0.65 % SOLN nasal spray Place 1 spray into both nostrils daily as needed for congestion.  . vitamin E 400 UNIT capsule Take 400 Units by mouth daily.    Review of Systems  Constitutional: Negative for chills, fatigue and fever.  Respiratory: Positive for shortness of breath (stable; with activity secondary COPD). Negative for cough, chest tightness and wheezing.   Cardiovascular: Negative for chest pain, palpitations and leg swelling.  Endocrine: Positive for cold intolerance.    Objective:  BP 122/90 (BP Location: Left Arm, Patient Position: Sitting, Cuff Size: Normal)   Pulse 72   Temp 97.7 F (36.5 C) (Temporal)   Ht 5\' 2"  (1.575 m)   Wt 122 lb 11.2 oz (55.7 kg)   SpO2 94%   BMI 22.44 kg/m   Weight: 122 lb 11.2 oz (55.7 kg)   BP Readings from Last 3 Encounters:  12/03/19 122/90  06/26/19 130/80  06/19/19 (!) 145/96   Wt Readings from Last 3 Encounters:  12/03/19 122 lb 11.2 oz (55.7 kg)  06/26/19 125 lb 9.6 oz (57 kg)  06/19/19 125 lb (56.7 kg)    Physical Exam Constitutional:      General: She is not in acute distress.    Appearance: She is well-developed.  Cardiovascular:     Rate and Rhythm: Normal rate and regular rhythm.     Heart sounds: Normal heart sounds. No murmur. No friction rub.  Pulmonary:     Effort: Pulmonary effort is normal. No respiratory distress.     Breath sounds: Normal breath sounds. No wheezing or rales.  Abdominal:     General: Abdomen is flat. Bowel sounds are normal.     Palpations: Abdomen is  soft.     Tenderness: There is no abdominal tenderness.     Comments: Cannot appreciate hernia when supine, but with standing there is left inguinal hernia palpable, reducible and nontender along entire inguinal canal. approx 2.5x 6cm  Musculoskeletal:     Right lower leg: No edema.     Left lower leg: No edema.  Neurological:     Mental Status: She is alert and oriented to person, place, and time.  Psychiatric:        Behavior: Behavior normal.     Assessment/Plan  1. Essential hypertension Stable; continue current medications - CBC with Differential/Platelet; Future - Comprehensive metabolic panel; Future  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 Chase Caller, Pulm Has been stable.   3. Mixed hyperlipidemia Crestor; stable - Lipid panel; Future  4. Cold intolerance - TSH; Future - IBC + Ferritin; Future - T3, free; Future - T4, free; Future  5. Osteopenia, unspecified location Keep up with exercise, calcium, vitamin D  6. Vitamin D deficiency Continue w supplementation - VITAMIN D 25 Hydroxy (Vit-D Deficiency, Fractures); Future  7. Vitamin B12 deficiency - Vitamin B12; Future  8. Left inguinal hernia She is going to monitor. We discussed signs of worsening and she will let me know if desires to see surgeon.     Return for pending bloodwork.      Micheline Rough, MD

## 2019-12-03 NOTE — Addendum Note (Signed)
Addended by: Elmer Picker on: 12/03/2019 09:11 AM   Modules accepted: Orders

## 2019-12-05 ENCOUNTER — Other Ambulatory Visit: Payer: Self-pay | Admitting: Family Medicine

## 2019-12-07 DIAGNOSIS — R0600 Dyspnea, unspecified: Secondary | ICD-10-CM | POA: Diagnosis not present

## 2019-12-07 DIAGNOSIS — J449 Chronic obstructive pulmonary disease, unspecified: Secondary | ICD-10-CM | POA: Diagnosis not present

## 2019-12-07 DIAGNOSIS — J439 Emphysema, unspecified: Secondary | ICD-10-CM | POA: Diagnosis not present

## 2019-12-29 DIAGNOSIS — M542 Cervicalgia: Secondary | ICD-10-CM | POA: Diagnosis not present

## 2020-01-07 DIAGNOSIS — J449 Chronic obstructive pulmonary disease, unspecified: Secondary | ICD-10-CM | POA: Diagnosis not present

## 2020-01-07 DIAGNOSIS — J439 Emphysema, unspecified: Secondary | ICD-10-CM | POA: Diagnosis not present

## 2020-01-07 DIAGNOSIS — R0600 Dyspnea, unspecified: Secondary | ICD-10-CM | POA: Diagnosis not present

## 2020-01-08 ENCOUNTER — Other Ambulatory Visit: Payer: Self-pay | Admitting: Internal Medicine

## 2020-01-08 DIAGNOSIS — J449 Chronic obstructive pulmonary disease, unspecified: Secondary | ICD-10-CM

## 2020-01-08 DIAGNOSIS — Z85118 Personal history of other malignant neoplasm of bronchus and lung: Secondary | ICD-10-CM

## 2020-01-08 DIAGNOSIS — Z08 Encounter for follow-up examination after completed treatment for malignant neoplasm: Secondary | ICD-10-CM

## 2020-01-20 ENCOUNTER — Other Ambulatory Visit: Payer: Self-pay | Admitting: Family Medicine

## 2020-02-06 DIAGNOSIS — J439 Emphysema, unspecified: Secondary | ICD-10-CM | POA: Diagnosis not present

## 2020-02-06 DIAGNOSIS — R0600 Dyspnea, unspecified: Secondary | ICD-10-CM | POA: Diagnosis not present

## 2020-02-06 DIAGNOSIS — J449 Chronic obstructive pulmonary disease, unspecified: Secondary | ICD-10-CM | POA: Diagnosis not present

## 2020-02-09 ENCOUNTER — Other Ambulatory Visit: Payer: Self-pay | Admitting: Internal Medicine

## 2020-02-09 DIAGNOSIS — Z85118 Personal history of other malignant neoplasm of bronchus and lung: Secondary | ICD-10-CM

## 2020-02-09 DIAGNOSIS — J449 Chronic obstructive pulmonary disease, unspecified: Secondary | ICD-10-CM

## 2020-02-09 DIAGNOSIS — Z08 Encounter for follow-up examination after completed treatment for malignant neoplasm: Secondary | ICD-10-CM

## 2020-02-18 DIAGNOSIS — H349 Unspecified retinal vascular occlusion: Secondary | ICD-10-CM | POA: Diagnosis not present

## 2020-02-18 DIAGNOSIS — Z961 Presence of intraocular lens: Secondary | ICD-10-CM | POA: Diagnosis not present

## 2020-02-18 DIAGNOSIS — H524 Presbyopia: Secondary | ICD-10-CM | POA: Diagnosis not present

## 2020-02-19 ENCOUNTER — Ambulatory Visit (INDEPENDENT_AMBULATORY_CARE_PROVIDER_SITE_OTHER): Payer: Medicare Other

## 2020-02-19 ENCOUNTER — Other Ambulatory Visit: Payer: Self-pay

## 2020-02-19 DIAGNOSIS — Z0000 Encounter for general adult medical examination without abnormal findings: Secondary | ICD-10-CM | POA: Diagnosis not present

## 2020-02-19 NOTE — Progress Notes (Signed)
Subjective:   [name] is a 79 y.o. female who presents for Medicare Annual (Subsequent) preventive examination.  I connected with [name]  today by telephone and verified that I am speaking with the correct person using two identifiers. Location patient: home Location provider: work Persons participating in the virtual visit: patient, provider.   I discussed the limitations, risks, security and privacy concerns of performing an evaluation and management service by telephone and the availability of in person appointments. I also discussed with the patient that there may be a patient responsible charge related to this service. The patient expressed understanding and verbally consented to this telephonic visit.    Interactive audio and video telecommunications were attempted between this provider and patient, however failed, due to patient having technical difficulties OR patient did not have access to video capability.  We continued and completed visit with audio only.      Review of Systems    N/A Cardiac Risk Factors include: advanced age (>59men, >31 women)     Objective:    There were no vitals filed for this visit. There is no height or weight on file to calculate BMI.  Advanced Directives 02/19/2020 06/19/2019 10/04/2017 09/14/2016 05/25/2016 09/22/2015 09/10/2015  Does Patient Have a Medical Advance Directive? Yes Yes Yes Yes Yes Yes Yes  Type of Paramedic of [place];Living will [place];Living will - - Healthcare Power of [place]  Does patient want to make changes to medical advance directive? No - Patient declined - - - - No - Patient declined -  Copy of [place] in Chart? Yes - validated most recent copy scanned in chart (See row information) - - - No - copy requested Yes -  Would patient like information on creating a medical  advance directive? - - - - - - -  Pre-existing out of facility DNR order (yellow form or pink MOST form) - - - - - - -    Current Medications (verified) Outpatient Encounter Medications as of 02/19/2020  Medication Sig  . Acetaminophen (TYLENOL PO) Take by mouth as needed. Alternates with Ibuprofen  . amLODipine (NORVASC) 5 MG tablet TAKE 1 TABLET BY MOUTH AT BEDTIME  . Cholecalciferol (VITAMIN D-3 PO) Take 1,200 mg by mouth every morning.  . Cyanocobalamin (VITAMIN B-12 PO) Take by mouth.  Marland Kitchen FLUoxetine (PROZAC) 20 MG capsule Take 1 capsule by mouth twice daily  . fluticasone (FLONASE) 50 MCG/ACT nasal spray Place 2 sprays into both nostrils daily. (Patient taking differently: Place 2 sprays into both nostrils daily as needed. )  . Glucosamine 500 MG CAPS Take 1,200 mg by mouth 2 (two) times daily.  Marland Kitchen PROAIR HFA 108 (90 Base) MCG/ACT inhaler INHALE 2 PUFFS BY MOUTH EVERY 6 HOURS AS NEEDED FOR WHEEZING OR SHORTNESS OF BREATH  . Probiotic Product (PROBIOTIC DAILY PO) Take by mouth daily. IB Guard  . rosuvastatin (CRESTOR) 5 MG tablet Take 1 tablet by mouth once daily  . TRELEGY ELLIPTA 100-62.5-25 MCG/INH AEPB INHALE 1 PUFF ONCE DAILY  . vitamin E 400 UNIT capsule Take 400 Units by mouth daily.  . sodium chloride (OCEAN) 0.65 % SOLN nasal spray Place 1 spray into both nostrils daily as needed for congestion. (Patient not taking: Reported on 02/19/2020)   No facility-administered encounter medications on file as of 02/19/2020.    Allergies (verified) Clindamycin/lincomycin, Penicillins, Spiriva [tiotropium bromide monohydrate], and Sulfa antibiotics  History: Past Medical History:  Diagnosis Date  . Allergic rhinitis   . Anemia   . Arthritis    all over  . Bilateral cataracts   . Bronchogenic lung cancer ([place]) 09/12/2011    # Stage 1 lung cancer: pT1b, pN0 Stage 08 October 2011 - s/p lobectomy by Dr Servando Snare - surveillance by Dr Earlie Server - 05/15/13 -  Clear CT. No recurrence - October 2015 - clear CT,No recurrence             -Oct 2016 >no recurrence   . Chicken pox   . Cholelithiasis 05/21/2014   On CT scan 2015   . COPD (chronic obstructive pulmonary disease) ([place])   . Depression    hx ocd and anxiety in record as well  . Gout    last time 6-17yrs ago   . History of blood transfusion   . Hot flashes    takes Prozac daily  . Hx of migraines 90's   after menopause HA stopped  . Hyperlipidemia    taking Flax Seed Oil and Fish Oil  . Hypertension    takes Amlodipine nightly  . IBS (irritable bowel syndrome)   . Liver cyst   . Lung cancer ([place])   . Macrocytosis   . Migraine    MIGRAINES RESOLVED WITH MENOPAUSE  . Osteopenia    takes Calcium and Vit D bid  . Pneumonia    x 2 ;last time back in 1999  . PONV (postoperative nausea and vomiting)   . 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 Seasonal allergies   . Smoking 09/12/2011   Past Surgical History:  Procedure Laterality Date  . CARPAL TUNNEL RELEASE  80's   right   . COLONOSCOPY    . cyst removed  >68yrs ago   from left leg  . DILATION AND CURETTAGE OF UTERUS     at age 17  . EXCISIONAL TOTAL KNEE ARTHROPLASTY Right 06/03/2014   Procedure: RIGHT KNEE SCAR EXCISION;  Surgeon: [name], MD;  Location: [facility];  Service: Orthopedics;  Laterality: Right;  . INGUINAL HERNIA REPAIR  2000  . ROTATOR CUFF REPAIR  2001   right  . TONSILLECTOMY  as a child   and adenoids  . TOTAL ABDOMINAL HYSTERECTOMY  2003   benign tumor-[place]  . TOTAL HIP ARTHROPLASTY  2011   left  . TOTAL HIP ARTHROPLASTY  03/07/2012   Procedure: TOTAL HIP ARTHROPLASTY ANTERIOR APPROACH;  Surgeon: [name], MD;  Location: [facility];  Service: Orthopedics;  Laterality: Right;  . TOTAL KNEE ARTHROPLASTY Right 01/02/2013   Procedure: RIGHT TOTAL KNEE ARTHROPLASTY;  Surgeon: [name], MD;  Location: [facility];  Service: Orthopedics;  Laterality:  Right;  . TOTAL KNEE ARTHROPLASTY Left 04/03/2013   Procedure: LEFT TOTAL KNEE ARTHROPLASTY;  Surgeon: [name], MD;  Location: [facility];  Service: Orthopedics;  Laterality: Left;  Marland Kitchen VIDEO BRONCHOSCOPY  10/20/2011   Procedure: VIDEO BRONCHOSCOPY;  Surgeon: Grace Isaac, MD;  [facility];  Service: Thoracic;  Laterality: N/A;  . WEDGE RESECTION  10-20-2011   rt lung  - for lung cancer   Family History  Problem Relation Age of Onset  . Other Mother        polyarteritis  . Bowel Disease Father 11       colectomy/bowel obstruction issues  . Heart attack Father   . Hypertension Brother   .  Arthritis Brother   . Post-traumatic stress disorder Brother   . Alcohol abuse Other   . Depression Other   . Hearing loss Other   . Heart disease Other   . Hypertension Other   . Anesthesia problems Neg Hx   . Hypotension Neg Hx   . Malignant hyperthermia Neg Hx   . Pseudochol deficiency Neg Hx   . Breast cancer Neg Hx    Social History   Socioeconomic History  . Marital status: Married    Spouse name: Not on file  . Number of children: Not on file  . Years of education: Not on file  . Highest education level: Not on file  Occupational History  . Occupation: retired  Tobacco Use  . Smoking status: Former Smoker    Packs/day: 0.50    Years: 50.00    Pack years: 25.00    Types: Cigarettes    Quit date: 09/23/2011    Years since quitting: 8.4  . Smokeless tobacco: Never Used  Substance and Sexual Activity  . Alcohol use: Yes    Comment: 2 beers every night  . Drug use: No  . Sexual activity: Never  Other Topics Concern  . Not on file  Social History Narrative  . Not on file   Social Determinants of Health   Financial Resource Strain: Low Risk   . Difficulty of Paying Living Expenses: Not hard at all  Food Insecurity: No Food Insecurity  . Worried About Charity fundraiser in the Last Year: Never true  . Ran Out of Food in the Last Year: Never true  Transportation  Needs: No Transportation Needs  . Lack of Transportation (Medical): No  . Lack of Transportation (Non-Medical): No  Physical Activity: Insufficiently Active  . Days of Exercise per Week: 1 day  . Minutes of Exercise per Session: 30 min  Stress: No Stress Concern Present  . Feeling of Stress : Not at all  Social Connections: Moderately Isolated  . Frequency of Communication with Friends and Family: More than three times a week  . Frequency of Social Gatherings with Friends and Family: More than three times a week  . Attends Religious Services: Never  . Active Member of Clubs or Organizations: No  . Attends Archivist Meetings: Never  . Marital Status: Married    Tobacco Counseling Counseling given: Not Answered   Clinical Intake:  Pre-visit preparation completed: Yes  Pain : No/denies pain     Nutritional Risks: Nausea/ vomitting/ diarrhea (Has diarrhea daily) Diabetes: No  How often do you need to have someone help you when you read instructions, pamphlets, or other written materials from your doctor or pharmacy?: 1 - Never What is the last grade level you completed in school?: 1 year of Graduate  Diabetic? No  Interpreter Needed?: No  Information entered by :: [place] of Daily Living In your present state of health, do you have any difficulty performing the following activities: 02/19/2020  Hearing? N  Vision? N  Walking or climbing stairs? Y  Comment Gets Short of breath  Dressing or bathing? N  Doing errands, shopping? N  Preparing Food and eating ? N  Using the Toilet? N  In the past six months, have you accidently leaked urine? N  Do you have problems with loss of bowel control? N  Managing your Medications? N  Managing your Finances? N  Housekeeping or managing your Housekeeping? N  Some recent data might be hidden  Patient Care Team: Caren Macadam, MD as PCP - General (Family Medicine) Brand Males, MD  (Pulmonary Disease) Grace Isaac, MD (Cardiothoracic Surgery) Lorretta Harp, MD as Consulting Physician (Cardiology) Curt Bears, MD as Consulting Physician (Hematology and Oncology)  Indicate any recent Medical Services you may have received from other than [facility] providers in the past year (date may be approximate).     Assessment:   This is a routine wellness examination for Alianis.  Hearing/Vision screen  Hearing Screening   125Hz  250Hz  500Hz  1000Hz  2000Hz  3000Hz  4000Hz  6000Hz  8000Hz   Right ear:           Left ear:           Vision Screening Comments: Patient gets annual eye exam   Dietary issues and exercise activities discussed: Current Exercise Habits: Structured exercise class, Type of exercise: Other - see comments (aerobics), Time (Minutes): 30, Frequency (Times/Week): 1, Weekly Exercise (Minutes/Week): 30, Intensity: Mild  Goals    . DIET - INCREASE WATER INTAKE     Patient will drink 6-8 ounces of water per day    . patient     Maintenance of health; with relaxation    . Patient Stated     To start at the pool;    . Patient Stated     I will continue water aerobics weekly       Depression Screen PHQ 2/9 Scores 02/19/2020 02/13/2019 06/01/2018 09/14/2016 09/22/2015 09/22/2015 09/20/2014  PHQ - 2 Score 0 0 0 0 0 0 0  PHQ- 9 Score 0 2 2 - - - -    Fall Risk Fall Risk  02/19/2020 10/04/2017 09/14/2016 09/22/2015 09/22/2015  Falls in the past year? 1 Yes Yes No No  Comment - - stepped on cats tail  - -  Number falls in past yr: 0 1 1 - -  Injury with Fall? 1 No - - -  Comment Left wrist - - - -  Risk Factor Category  - - - - -  Risk for fall due to : History of fall(s);Impaired balance/gait - - - -  Follow up Falls evaluation completed;Falls prevention discussed Education provided Education provided - -  Comment - states she falls at home x 1 but no injury  - - -    Any stairs in or around the home? Yes  If so, are there any without handrails? No  Home  free of loose throw rugs in walkways, pet beds, electrical cords, etc? Yes  Adequate lighting in your home to reduce risk of falls? Yes   ASSISTIVE DEVICES UTILIZED TO PREVENT FALLS:  Life alert? No  Use of a cane, walker or w/c? Yes , uses a cane Grab bars in the bathroom? Yes  Shower chair or bench in shower? Yes  Elevated toilet seat or a handicapped toilet? Yes     Cognitive Function: Patient memory noted to be normal by direct observation MMSE - Mini Mental State Exam 02/19/2020 10/04/2017  Not completed: Refused (No Data)        Immunizations Immunization History  Administered Date(s) Administered  . Fluad Quad(high Dose 65+) 04/10/2019  . Influenza Split 06/02/2011, 05/09/2012  . Influenza, High Dose Seasonal PF 04/19/2016, 04/27/2017, 05/18/2018  . Influenza,inj,Quad PF,6+ Mos 05/18/2013, 04/17/2014, 05/21/2015  . PFIZER SARS-COV-2 Vaccination 09/15/2019, 10/10/2019  . Pneumococcal Conjugate-13 04/05/2014  . Pneumococcal Polysaccharide-23 08/09/1998, 09/21/2011  . Tdap 06/22/2012  . Zoster 08/10/2007    TDAP status: Up to date Flu Vaccine status:  Up to date Pneumococcal vaccine status: Up to date Covid-19 vaccine status: Completed vaccines  Qualifies for Shingles Vaccine? Yes   Zostavax completed Yes   Shingrix Completed?: No.    Education has been provided regarding the importance of this vaccine. Patient has been advised to call insurance company to determine out of pocket expense if they have not yet received this vaccine. Advised may also receive vaccine at local pharmacy or Health Dept. Verbalized acceptance and understanding.  Screening Tests Health Maintenance  Topic Date Due  . Hepatitis C Screening  Never done  . INFLUENZA VACCINE  03/09/2020  . TETANUS/TDAP  06/22/2022  . DEXA SCAN  Completed  . COVID-19 Vaccine  Completed  . PNA vac Low Risk Adult  Completed    Health Maintenance  Health Maintenance Due  Topic Date Due  . Hepatitis C  Screening  Never done    Colorectal cancer screening: Completed cologuard on 05/21/2019. Repeat every 3 years Mammogram status: Completed 04/09/2019. Repeat every year Bone Density status: Completed 06/05/2018. Results reflect: Bone density results: OSTEOPOROSIS. Repeat every 2 years.  Lung Cancer Screening: (Low Dose CT Chest recommended if Age 51-80 years, 30 pack-year currently smoking OR have quit w/in 15years.) does qualify.   Lung Cancer Screening Referral: Patient has hx of lung cancer with defer to pulmonology  Additional Screening:  Hepatitis C Screening: does qualify; Patient will defer until next in person office visit  Vision Screening: Recommended annual ophthalmology exams for early detection of glaucoma and other disorders of the eye. Is the patient up to date with their annual eye exam?  Yes  Who is the provider or what is the name of the office in which the patient attends annual eye exams? [facility]  If pt is not established with a provider, would they like to be referred to a provider to establish care? No .   Dental Screening: Recommended annual dental exams for proper oral hygiene  Community Resource Referral / Chronic Care Management: CRR required this visit?  No   CCM required this visit?  No      Plan:     I have personally reviewed and noted the following in the patient's chart:   . Medical and social history . Use of alcohol, tobacco or illicit drugs  . Current medications and supplements . Functional ability and status . Nutritional status . Physical activity . Advanced directives . List of other physicians . Hospitalizations, surgeries, and ER visits in previous 12 months . Vitals . Screenings to include cognitive, depression, and falls . Referrals and appointments  In addition, I have reviewed and discussed with patient certain preventive protocols, quality metrics, and best practice recommendations. A written  personalized care plan for preventive services as well as general preventive health recommendations were provided to patient.     Ofilia Neas, LPN   2/70/3500   Nurse Notes: Patient had c/o issues with possible hernia. She would like to have a surgical consultation.

## 2020-02-19 NOTE — Patient Instructions (Signed)
[name] , Thank you for taking time to come for your Medicare Wellness Visit. I appreciate your ongoing commitment to your health goals. Please review the following plan we discussed and let me know if I can assist you in the future.   Screening recommendations/referrals: Colonoscopy: Completed Cologuard on 05/21/2019, Next due 05/2022 Mammogram: Up to date, next due 04/08/2020 Bone Density: Up to date, next due 06/05/2020 Recommended yearly ophthalmology/optometry visit for glaucoma screening and checkup Recommended yearly dental visit for hygiene and checkup  Vaccinations: Influenza vaccine: Up to date next due 03/2020 Pneumococcal vaccine: completed series Tdap vaccine: Up to date, due 06/22/2022 Shingles vaccine:  Patient states she has had these vaccines unsure of date  Advanced directives: Copy of Advanced directives on file.  Conditions/risks identified: Increase water intake to at least 6-8 cups per day   Next appointment: None we will call next year and schedule your Medicare wellness visit.   Preventive Care 43 Years and Older, Female Preventive care refers to lifestyle choices and visits with your health care provider that can promote health and wellness. What does preventive care include?  A yearly physical exam. This is also called an annual well check.  Dental exams once or twice a year.  Routine eye exams. Ask your health care provider how often you should have your eyes checked.  Personal lifestyle choices, including:  Daily care of your teeth and gums.  Regular physical activity.  Eating a healthy diet.  Avoiding tobacco and drug use.  Limiting alcohol use.  Practicing safe sex.  Taking low-dose aspirin every day.  Taking vitamin and mineral supplements as recommended by your health care provider. What happens during an annual well check? The services and screenings done by your health care provider during your annual well check will depend on  your age, overall health, lifestyle risk factors, and family history of disease. Counseling  Your health care provider may ask you questions about your:  Alcohol use.  Tobacco use.  Drug use.  Emotional well-being.  Home and relationship well-being.  Sexual activity.  Eating habits.  History of falls.  Memory and ability to understand (cognition).  Work and work Statistician.  Reproductive health. Screening  You may have the following tests or measurements:  Height, weight, and BMI.  Blood pressure.  Lipid and cholesterol levels. These may be checked every 5 years, or more frequently if you are over 72 years old.  Skin check.  Lung cancer screening. You may have this screening every year starting at age 73 if you have a 30-pack-year history of smoking and currently smoke or have quit within the past 15 years.  Fecal occult blood test (FOBT) of the stool. You may have this test every year starting at age 80.  Flexible sigmoidoscopy or colonoscopy. You may have a sigmoidoscopy every 5 years or a colonoscopy every 10 years starting at age 54.  Hepatitis C blood test.  Hepatitis B blood test.  Sexually transmitted disease (STD) testing.  Diabetes screening. This is done by checking your blood sugar (glucose) after you have not eaten for a while (fasting). You may have this done every 1-3 years.  Bone density scan. This is done to screen for osteoporosis. You may have this done starting at age 110.  Mammogram. This may be done every 1-2 years. Talk to your health care provider about how often you should have regular mammograms. Talk with your health care provider about your test results, treatment options, and if necessary,  the need for more tests. Vaccines  Your health care provider may recommend certain vaccines, such as:  Influenza vaccine. This is recommended every year.  Tetanus, diphtheria, and acellular pertussis (Tdap, Td) vaccine. You may need a Td booster  every 10 years.  Zoster vaccine. You may need this after age 49.  Pneumococcal 13-valent conjugate (PCV13) vaccine. One dose is recommended after age 66.  Pneumococcal polysaccharide (PPSV23) vaccine. One dose is recommended after age 51. Talk to your health care provider about which screenings and vaccines you need and how often you need them. This information is not intended to replace advice given to you by your health care provider. Make sure you discuss any questions you have with your health care provider. Document Released: 08/22/2015 Document Revised: 04/14/2016 Document Reviewed: 05/27/2015 Elsevier Interactive Patient Education  2017 [employer] Prevention in the Home Falls can cause injuries. They can happen to people of all ages. There are many things you can do to make your home safe and to help prevent falls. What can I do on the outside of my home?  Regularly fix the edges of walkways and driveways and fix any cracks.  Remove anything that might make you trip as you walk through a door, such as a raised step or threshold.  Trim any bushes or trees on the path to your home.  Use bright outdoor lighting.  Clear any walking paths of anything that might make someone trip, such as rocks or tools.  Regularly check to see if handrails are loose or broken. Make sure that both sides of any steps have handrails.  Any raised decks and porches should have guardrails on the edges.  Have any leaves, snow, or ice cleared regularly.  Use sand or salt on walking paths during winter.  Clean up any spills in your garage right away. This includes oil or grease spills. What can I do in the bathroom?  Use night lights.  Install grab bars by the toilet and in the tub and shower. Do not use towel bars as grab bars.  Use non-skid mats or decals in the tub or shower.  If you need to sit down in the shower, use a plastic, non-slip stool.  Keep the floor dry. Clean up any  water that spills on the floor as soon as it happens.  Remove soap buildup in the tub or shower regularly.  Attach bath mats securely with double-sided non-slip rug tape.  Do not have throw rugs and other things on the floor that can make you trip. What can I do in the bedroom?  Use night lights.  Make sure that you have a light by your bed that is easy to reach.  Do not use any sheets or blankets that are too big for your bed. They should not hang down onto the floor.  Have a firm chair that has side arms. You can use this for support while you get dressed.  Do not have throw rugs and other things on the floor that can make you trip. What can I do in the kitchen?  Clean up any spills right away.  Avoid walking on wet floors.  Keep items that you use a lot in easy-to-reach places.  If you need to reach something above you, use a strong step stool that has a grab bar.  Keep electrical cords out of the way.  Do not use floor polish or wax that makes floors slippery. If you must use  wax, use non-skid floor wax.  Do not have throw rugs and other things on the floor that can make you trip. What can I do with my stairs?  Do not leave any items on the stairs.  Make sure that there are handrails on both sides of the stairs and use them. Fix handrails that are broken or loose. Make sure that handrails are as long as the stairways.  Check any carpeting to make sure that it is firmly attached to the stairs. Fix any carpet that is loose or worn.  Avoid having throw rugs at the top or bottom of the stairs. If you do have throw rugs, attach them to the floor with carpet tape.  Make sure that you have a light switch at the top of the stairs and the bottom of the stairs. If you do not have them, ask someone to add them for you. What else can I do to help prevent falls?  Wear shoes that:  Do not have high heels.  Have rubber bottoms.  Are comfortable and fit you well.  Are closed  at the toe. Do not wear sandals.  If you use a stepladder:  Make sure that it is fully opened. Do not climb a closed stepladder.  Make sure that both sides of the stepladder are locked into place.  Ask someone to hold it for you, if possible.  Clearly mark and make sure that you can see:  Any grab bars or handrails.  First and last steps.  Where the edge of each step is.  Use tools that help you move around (mobility aids) if they are needed. These include:  Canes.  Walkers.  Scooters.  Crutches.  Turn on the lights when you go into a dark area. Replace any light bulbs as soon as they burn out.  Set up your furniture so you have a clear path. Avoid moving your furniture around.  If any of your floors are uneven, fix them.  If there are any pets around you, be aware of where they are.  Review your medicines with your doctor. Some medicines can make you feel dizzy. This can increase your chance of falling. Ask your doctor what other things that you can do to help prevent falls. This information is not intended to replace advice given to you by your health care provider. Make sure you discuss any questions you have with your health care provider. Document Released: 05/22/2009 Document Revised: 01/01/2016 Document Reviewed: 08/30/2014 Elsevier Interactive Patient Education  2017 [employer].

## 2020-02-25 ENCOUNTER — Encounter (INDEPENDENT_AMBULATORY_CARE_PROVIDER_SITE_OTHER): Payer: Self-pay | Admitting: Ophthalmology

## 2020-02-25 ENCOUNTER — Ambulatory Visit (INDEPENDENT_AMBULATORY_CARE_PROVIDER_SITE_OTHER): Payer: Medicare Other | Admitting: Ophthalmology

## 2020-02-25 ENCOUNTER — Other Ambulatory Visit: Payer: Self-pay

## 2020-02-25 DIAGNOSIS — H43811 Vitreous degeneration, right eye: Secondary | ICD-10-CM | POA: Insufficient documentation

## 2020-02-25 DIAGNOSIS — H348322 Tributary (branch) retinal vein occlusion, left eye, stable: Secondary | ICD-10-CM | POA: Insufficient documentation

## 2020-02-25 DIAGNOSIS — H43812 Vitreous degeneration, left eye: Secondary | ICD-10-CM | POA: Insufficient documentation

## 2020-02-25 DIAGNOSIS — H353132 Nonexudative age-related macular degeneration, bilateral, intermediate dry stage: Secondary | ICD-10-CM

## 2020-02-25 DIAGNOSIS — H34832 Tributary (branch) retinal vein occlusion, left eye, with macular edema: Secondary | ICD-10-CM

## 2020-02-25 MED ORDER — BEVACIZUMAB CHEMO INJECTION 1.25MG/0.05ML SYRINGE FOR KALEIDOSCOPE
1.2500 mg | INTRAVITREAL | Status: AC | PRN
  Administered 2020-02-25: 1.25 mg via INTRAVITREAL

## 2020-02-25 NOTE — Progress Notes (Signed)
02/25/2020     CHIEF COMPLAINT Patient presents for Retina Follow Up   HISTORY OF PRESENT ILLNESS: [name] is a 79 y.o. female who presents to the clinic today for:   HPI    Retina Follow Up    Patient presents with  CRVO/BRVO.  In left eye.  This started 4 months ago.  Duration of 4 months.  Since onset it is stable.          Comments    4 month f/u with possible Avastin OS with dilated exam and OCT(MAC) today.  Pt states her vision has gotten worse in the left eye when she is reading and watching the television.        Last edited by Melburn Popper, COA on 02/25/2020 10:10 AM. (History)      Referring physician: Caren Macadam, MD [place],   [id number]  HISTORICAL INFORMATION:   Selected notes from the MEDICAL RECORD NUMBER    Lab Results  Component Value Date   HGBA1C 5.3 09/14/2016     CURRENT MEDICATIONS: No current outpatient medications on file. (Ophthalmic Drugs)   No current facility-administered medications for this visit. (Ophthalmic Drugs)   Current Outpatient Medications (Other)  Medication Sig   Acetaminophen (TYLENOL PO) Take by mouth as needed. Alternates with Ibuprofen   amLODipine (NORVASC) 5 MG tablet TAKE 1 TABLET BY MOUTH AT BEDTIME   Cholecalciferol (VITAMIN D-3 PO) Take 1,200 mg by mouth every morning.   Cyanocobalamin (VITAMIN B-12 PO) Take by mouth.   FLUoxetine (PROZAC) 20 MG capsule Take 1 capsule by mouth twice daily   fluticasone (FLONASE) 50 MCG/ACT nasal spray Place 2 sprays into both nostrils daily. (Patient taking differently: Place 2 sprays into both nostrils daily as needed. )   Glucosamine 500 MG CAPS Take 1,200 mg by mouth 2 (two) times daily.   PROAIR HFA 108 (90 Base) MCG/ACT inhaler INHALE 2 PUFFS BY MOUTH EVERY 6 HOURS AS NEEDED FOR WHEEZING OR SHORTNESS OF BREATH   Probiotic Product (PROBIOTIC DAILY PO) Take by mouth daily. IB Guard   rosuvastatin (CRESTOR) 5 MG  tablet Take 1 tablet by mouth once daily   sodium chloride (OCEAN) 0.65 % SOLN nasal spray Place 1 spray into both nostrils daily as needed for congestion. (Patient not taking: Reported on 02/19/2020)   TRELEGY ELLIPTA 100-62.5-25 MCG/INH AEPB INHALE 1 PUFF ONCE DAILY   vitamin E 400 UNIT capsule Take 400 Units by mouth daily.   No current facility-administered medications for this visit. 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 No recurrence - October 2015 - clear CT,No recurrence             -Oct 2016 >no recurrence    Chicken pox  Cholelithiasis 05/21/2014   On CT scan 2015    COPD (chronic obstructive pulmonary disease) (HCC)    Depression    hx ocd and anxiety in record as well   Gout    last time 6-60yr ago    History of blood transfusion    Hot flashes    takes Prozac daily   Hx of migraines 90's   after menopause HA stopped   Hyperlipidemia    taking Flax Seed Oil and Fish Oil   Hypertension    takes Amlodipine nightly   IBS (irritable bowel syndrome)    Liver cyst    Lung cancer (HCC)    Macrocytosis    Migraine    MIGRAINES RESOLVED WITH MENOPAUSE   Osteopenia    takes Calcium and Vit D bid   Pneumonia    x 2 ;last time back in 1999   PONV (postoperative nausea and vomiting)    S/P right THA, AA - Dr. OAlvan Dame ortho  03/07/2012   S/P right TKA 01/02/2013   Seasonal allergies    Smoking 09/12/2011   Past Surgical History:  Procedure Laterality Date   CARPAL TUNNEL RELEASE  80's   right    COLONOSCOPY     cyst removed  >[id number]   from left leg   DILATION AND CURETTAGE OF UTERUS     at age 79 EXCISIONAL TOTAL KNEE ARTHROPLASTY Right 06/03/2014   Procedure: RIGHT KNEE SCAR EXCISION;  Surgeon: MaMauri PoleMD;  Location: [facility];  Service: Orthopedics;  Laterality: Right;   INGUINAL HERNIA REPAIR  2000   ROTATOR CUFF REPAIR  2001   right   TONSILLECTOMY  as a child   and adenoids   TOTAL ABDOMINAL HYSTERECTOMY  2003   benign tumor-[place]   TOTAL HIP ARTHROPLASTY  2011   left   TOTAL HIP ARTHROPLASTY  03/07/2012   Procedure: TOTAL HIP ARTHROPLASTY ANTERIOR APPROACH;  Surgeon: MaMauri PoleMD;  Location: [facility];  Service: Orthopedics;  Laterality: Right;   TOTAL KNEE ARTHROPLASTY Right 01/02/2013   Procedure: RIGHT TOTAL KNEE ARTHROPLASTY;  Surgeon: MaMauri PoleMD;  Location: [facility];  Service: Orthopedics;  Laterality: Right;   TOTAL KNEE ARTHROPLASTY Left 04/03/2013   Procedure: LEFT TOTAL KNEE ARTHROPLASTY;  Surgeon: MaMauri PoleMD;  Location: [facility];  Service: Orthopedics;  Laterality: Left;   VIDEO BRONCHOSCOPY  10/20/2011   Procedure: VIDEO BRONCHOSCOPY;  Surgeon: EdGrace IsaacMD;  [facility];  Service: Thoracic;  Laterality: N/A;   WEDGE RESECTION  10-20-2011   rt lung  - for lung cancer    FAMILY HISTORY Family History  Problem Relation Age of Onset   Other Mother        polyarteritis   Bowel Disease Father [id number]     colectomy/bowel obstruction issues   Heart attack Father    Hypertension Brother    Arthritis Brother    Post-traumatic stress disorder Brother    Alcohol abuse Other    Depression Other    Hearing loss Other    Heart disease Other    Hypertension Other    Anesthesia problems Neg Hx    Hypotension Neg Hx     Malignant hyperthermia Neg Hx    Pseudochol deficiency Neg Hx    Breast cancer Neg Hx     SOCIAL HISTORY Social History   Tobacco Use   Smoking status: Former Smoker    Packs/day: 0.50  Years: 50.00    Pack years: 25.00    Types: Cigarettes    Quit date: 09/23/2011    Years since quitting: 8.4   Smokeless tobacco: Never Used  Substance Use Topics   Alcohol use: Yes    Comment: 2 beers every night   Drug use: No         OPHTHALMIC EXAM:  Base Eye Exam    Visual Acuity (ETDRS)      Right Left   Dist cc 20/50 -2 20/200   Dist ph cc 20/40 -1 20/100   Correction: Glasses       Tonometry (Tonopen, 10:17 AM)      Right Left   Pressure 12 13       Pupils      Pupils Dark Light Shape React APD   Right PERRL 4 3 Round Slow None   Left PERRL 4 5 Round Slow +1       Visual Fields (Counting fingers)      Left Right    Full Full       Extraocular Movement      Right Left    Full Full       Neuro/Psych    Oriented x3: Yes   Mood/Affect: Normal       Dilation    Both eyes: 1.0% Mydriacyl, 2.5% Phenylephrine @ 10:17 AM        Slit Lamp and Fundus Exam    External Exam      Right Left   External Normal Normal       Slit Lamp Exam      Right Left   Lids/Lashes Normal Normal   Conjunctiva/Sclera White and quiet White and quiet   Cornea Clear Clear   Anterior Chamber Deep and quiet Deep and quiet   Iris Round and reactive Round and reactive   Lens Posterior chamber intraocular lens Posterior chamber intraocular lens   Anterior Vitreous Normal Normal       Fundus Exam      Right Left   Posterior Vitreous Posterior vitreous detachment Normal   Disc Normal Normal   C/D Ratio 0.45 0.4   Macula Early age related macular degeneration Geographic atrophy, , Microaneurysms, Intermediate age related macular degeneration   Vessels Normal Old retinal vein occlusion, branch.   Periphery  Normal          IMAGING AND PROCEDURES  Imaging and  Procedures for 02/25/20  OCT, Retina - OU - Both Eyes       Right Eye Quality was good. Scan locations included subfoveal. Central Foveal Thickness: 260.   Left Eye Quality was good. Central Foveal Thickness: 236.   Notes OS branch retinal vein occlusion with CME, much improved and stable at current 70-monthexamination. Will repeat OS today for stability and repeat examination OU in 5 months       Intravitreal Injection, Pharmacologic Agent - OS - Left Eye       Time Out 02/25/2020. 11:22 AM. Confirmed correct patient, procedure, site, and patient consented.   Anesthesia Topical anesthesia was used.   Procedure Preparation included Ofloxacin , 10% betadine to eyelids, 5% betadine to ocular surface. A 30 gauge needle was used.   Injection:  1.25 mg Bevacizumab (AVASTIN) SOLN   NDC: 661950-9326-7 Lot:: [id number]  Route: Intravitreal, Site: Left Eye, Waste: 0 mg  Post-op Post injection exam found visual acuity of at least counting fingers. The patient tolerated the procedure well. There were no complications.  The patient received written and verbal post procedure care education. Post injection medications were not given.                 ASSESSMENT/PLAN:  No problem-specific Assessment & Plan notes found for this encounter.      ICD-10-CM   1. Branch retinal vein occlusion with macular edema of left eye  H34.8320 OCT, Retina - OU - Both Eyes    Intravitreal Injection, Pharmacologic Agent - OS - Left Eye    Bevacizumab (AVASTIN) SOLN 1.25 mg  2. Posterior vitreous detachment of left eye  H43.812   3. Posterior vitreous detachment of right eye  H43.811   4. Intermediate stage nonexudative age-related macular degeneration of both eyes  H35.3132 OCT, Retina - OU - Both Eyes    1.  2.  3.  Ophthalmic Meds Ordered this visit:  Meds ordered this encounter  Medications   Bevacizumab (AVASTIN) SOLN 1.25 mg       Return in about 5 months (around 07/27/2020) for  DILATE OU, OCT.  There are no Patient Instructions on file for this visit.   Explained the diagnoses, plan, and follow up with the patient and they expressed understanding.  Patient expressed understanding of the importance of proper follow up care.   Clent Demark Tyrick Dunagan M.D. Diseases & Surgery of the Retina and Vitreous [facility] [place] 02/25/20     Abbreviations: M myopia (nearsighted); A astigmatism; H hyperopia (farsighted); P presbyopia; Mrx spectacle prescription;  CTL contact lenses; OD right eye; OS left eye; OU both eyes  XT exotropia; ET esotropia; PEK punctate epithelial keratitis; PEE punctate epithelial erosions; DES dry eye syndrome; MGD meibomian gland dysfunction; ATs artificial tears; PFAT's preservative free artificial tears; [place] nuclear sclerotic cataract; PSC posterior subcapsular cataract; ERM epi-retinal membrane; PVD posterior vitreous detachment; RD retinal detachment; DM diabetes mellitus; DR diabetic retinopathy; NPDR non-proliferative diabetic retinopathy; PDR proliferative diabetic retinopathy; CSME clinically significant macular edema; DME diabetic macular edema; dbh dot blot hemorrhages; CWS cotton wool spot; POAG primary open angle glaucoma; C/D cup-to-disc ratio; HVF humphrey visual field; GVF goldmann visual field; OCT optical coherence tomography; IOP intraocular pressure; BRVO Branch retinal vein occlusion; CRVO central retinal vein occlusion; CRAO central retinal artery occlusion; BRAO branch retinal artery occlusion; RT retinal tear; SB scleral buckle; PPV pars plana vitrectomy; VH Vitreous hemorrhage; PRP panretinal laser photocoagulation; IVK intravitreal kenalog; VMT vitreomacular traction; MH Macular hole;  NVD neovascularization of the disc; NVE neovascularization elsewhere; AREDS age related eye disease study; ARMD age related macular degeneration; POAG primary open angle glaucoma; EBMD epithelial/anterior basement membrane dystrophy; ACIOL anterior  chamber intraocular lens; IOL intraocular lens; PCIOL posterior chamber intraocular lens; Phaco/IOL phacoemulsification with intraocular lens placement; [place] photorefractive keratectomy; LASIK laser assisted in situ keratomileusis; HTN hypertension; DM diabetes mellitus; COPD chronic obstructive pulmonary disease

## 2020-03-08 DIAGNOSIS — J449 Chronic obstructive pulmonary disease, unspecified: Secondary | ICD-10-CM | POA: Diagnosis not present

## 2020-03-08 DIAGNOSIS — J439 Emphysema, unspecified: Secondary | ICD-10-CM | POA: Diagnosis not present

## 2020-03-08 DIAGNOSIS — R0600 Dyspnea, unspecified: Secondary | ICD-10-CM | POA: Diagnosis not present

## 2020-03-11 ENCOUNTER — Other Ambulatory Visit: Payer: Self-pay | Admitting: Family Medicine

## 2020-03-11 DIAGNOSIS — Z1231 Encounter for screening mammogram for malignant neoplasm of breast: Secondary | ICD-10-CM

## 2020-03-14 ENCOUNTER — Telehealth: Payer: Self-pay | Admitting: Internal Medicine

## 2020-03-14 DIAGNOSIS — J449 Chronic obstructive pulmonary disease, unspecified: Secondary | ICD-10-CM

## 2020-03-14 DIAGNOSIS — G4734 Idiopathic sleep related nonobstructive alveolar hypoventilation: Secondary | ICD-10-CM

## 2020-03-14 NOTE — Telephone Encounter (Signed)
LMTCB x1 for pt.  

## 2020-03-18 NOTE — Telephone Encounter (Signed)
Spoke with pt. States that she is not using her supplemental oxygen anymore. She is wanting [place] to send an order to [employer] to have this picked up.  MR - please advise. Thanks.

## 2020-03-19 NOTE — Telephone Encounter (Signed)
Order has been placed for pt's O2 to be discontinued. Attempted to call pt but unable to reach. Left her a detailed message on machine with this info as it is okay per DPR to leave detailed message. Nothing further needed.

## 2020-03-19 NOTE — Telephone Encounter (Signed)
Yes this is fine.

## 2020-04-09 ENCOUNTER — Ambulatory Visit
Admission: RE | Admit: 2020-04-09 | Discharge: 2020-04-09 | Disposition: A | Discharge status: Home / Self Care | Payer: Medicare Other | Source: Ambulatory Visit | Attending: Family Medicine | Admitting: Family Medicine

## 2020-04-09 ENCOUNTER — Other Ambulatory Visit: Payer: Self-pay

## 2020-04-09 DIAGNOSIS — Z1231 Encounter for screening mammogram for malignant neoplasm of breast: Secondary | ICD-10-CM

## 2020-04-11 ENCOUNTER — Encounter: Payer: Self-pay | Admitting: Family Medicine

## 2020-04-11 ENCOUNTER — Ambulatory Visit (INDEPENDENT_AMBULATORY_CARE_PROVIDER_SITE_OTHER): Payer: Medicare Other | Admitting: Family Medicine

## 2020-04-11 ENCOUNTER — Other Ambulatory Visit: Payer: Self-pay

## 2020-04-11 VITALS — BP 136/82 | HR 74 | Temp 97.9°F | Ht 62.0 in | Wt 120.8 lb

## 2020-04-11 DIAGNOSIS — J449 Chronic obstructive pulmonary disease, unspecified: Secondary | ICD-10-CM

## 2020-04-11 DIAGNOSIS — K439 Ventral hernia without obstruction or gangrene: Secondary | ICD-10-CM

## 2020-04-11 DIAGNOSIS — I10 Essential (primary) hypertension: Secondary | ICD-10-CM | POA: Diagnosis not present

## 2020-04-11 DIAGNOSIS — R42 Dizziness and giddiness: Secondary | ICD-10-CM

## 2020-04-11 DIAGNOSIS — E782 Mixed hyperlipidemia: Secondary | ICD-10-CM

## 2020-04-11 NOTE — Progress Notes (Signed)
[name] DOB: [birth year] Encounter date: 04/11/2020  This is a 79 y.o. female who presents with Chief Complaint  Patient presents with  . Cyst    patient complains of a knot LLQ x1 year, requests referral as she had a hernia on the right side years ago    History of present illness: Noted this over a year ago. Not painful, just there. Wants to be in best shape she can be with husband declining.   States she has no appetite.   Feels like energy is not as good since stopping using oxygen. Was so loud that she couldn't rest. Has follow up with pulm tomorrow. Notes diff in energy without this.   Always feeling cold.   HTN: not checking at home. Has been on same meds x 30 years.   Does a lot of yard work. Uses albuterol just if needed maybe a couple of days/week.    Allergies  Allergen Reactions  . Clindamycin/Lincomycin Nausea And Vomiting  . Penicillins Rash       . Spiriva [Tiotropium Bromide Monohydrate]     General intolerance  . Sulfa Antibiotics Hives and Rash    Severe Hives with skin reaction   Current Meds  Medication Sig  . Acetaminophen (TYLENOL PO) Take by mouth as needed. Alternates with Ibuprofen  . amLODipine (NORVASC) 5 MG tablet TAKE 1 TABLET BY MOUTH AT BEDTIME  . Cholecalciferol (VITAMIN D-3 PO) Take 1,200 mg by mouth every morning.  . Cyanocobalamin (VITAMIN B-12 PO) Take by mouth.  Marland Kitchen FLUoxetine (PROZAC) 20 MG capsule Take 1 capsule by mouth twice daily  . fluticasone (FLONASE) 50 MCG/ACT nasal spray Place 2 sprays into both nostrils daily. (Patient taking differently: Place 2 sprays into both nostrils daily as needed. )  . Glucosamine 500 MG CAPS Take 1,200 mg by mouth 2 (two) times daily.  Marland Kitchen OVER THE COUNTER MEDICATION Memory supplement-once a day  . PROAIR HFA 108 (90 Base) MCG/ACT inhaler INHALE 2 PUFFS BY MOUTH EVERY 6 HOURS AS NEEDED FOR WHEEZING OR SHORTNESS OF BREATH  . Probiotic Product (PROBIOTIC DAILY PO) Take by mouth daily. IB  Guard  . rosuvastatin (CRESTOR) 5 MG tablet Take 1 tablet by mouth once daily  . sodium chloride (OCEAN) 0.65 % SOLN nasal spray Place 1 spray into both nostrils daily as needed for congestion.   . TRELEGY ELLIPTA 100-62.5-25 MCG/INH AEPB INHALE 1 PUFF ONCE DAILY  . vitamin E 400 UNIT capsule Take 400 Units by mouth daily.    Review of Systems  Constitutional: Negative for activity change, appetite change, chills, fatigue, fever and unexpected weight change.  HENT: Negative for congestion, ear pain, hearing loss, sinus pressure, sinus pain, sore throat and trouble swallowing.   Eyes: Negative for pain and visual disturbance.  Respiratory: Negative for cough, chest tightness, shortness of breath and wheezing.   Cardiovascular: Negative for chest pain, palpitations and leg swelling.  Gastrointestinal: Negative for abdominal pain, blood in stool, constipation, diarrhea, nausea and vomiting.  Genitourinary: Negative for difficulty urinating and menstrual problem.  Musculoskeletal: Negative for arthralgias and back pain.  Skin: Negative for rash.  Neurological: Positive for dizziness (intermittent positional vertigo x last 2 weeks; similar to prior episodes. only notes with change in position). Negative for weakness, numbness and headaches.  Hematological: Negative for adenopathy. Does not bruise/bleed easily.  Psychiatric/Behavioral: Negative for sleep disturbance and suicidal ideas. The patient is not nervous/anxious.     Objective:  BP 136/82 (BP Location: Left Arm, Patient  Position: Sitting, Cuff Size: Normal)   Pulse 74   Temp 97.9 F (36.6 C) (Oral)   Ht 5\' 2"  (1.575 m)   Wt 120 lb 12.8 oz (54.8 kg)   SpO2 95%   BMI 22.09 kg/m   Weight: 120 lb 12.8 oz (54.8 kg)   BP Readings from Last 3 Encounters:  04/11/20 136/82  12/03/19 120/86  06/26/19 130/80   Wt Readings from Last 3 Encounters:  04/11/20 120 lb 12.8 oz (54.8 kg)  12/03/19 122 lb 11.2 oz (55.7 kg)  06/26/19 125 lb  9.6 oz (57 kg)    Physical Exam Constitutional:      General: She is not in acute distress.    Appearance: She is well-developed. She is not diaphoretic.  HENT:     Head: Normocephalic and atraumatic.     Right Ear: Tympanic membrane, ear canal and external ear normal.     Left Ear: Tympanic membrane, ear canal and external ear normal.  Eyes:     Conjunctiva/sclera: Conjunctivae normal.     Pupils: Pupils are equal, round, and reactive to light.  Neck:     Thyroid: No thyromegaly.  Cardiovascular:     Rate and Rhythm: Normal rate and regular rhythm.     Heart sounds: Normal heart sounds. No murmur heard.  No friction rub. No gallop.   Pulmonary:     Effort: Pulmonary effort is normal. No respiratory distress.     Breath sounds: Normal breath sounds. No wheezing or rales.  Abdominal:     Comments: Abdominal protuberance is more notable with standing.  When lying down, this does reduce back into the abdomen.  There is a large abdominal wall defect/"hernia left lower abdomen that is prominent to palpation at least 8 cm x 5 cm.  This is not tender to palpation.  Musculoskeletal:     Cervical back: Neck supple.  Lymphadenopathy:     Cervical: No cervical adenopathy.  Skin:    General: Skin is warm and dry.  Neurological:     Mental Status: She is alert and oriented to person, place, and time.     Cranial Nerves: No cranial nerve deficit.     Motor: No abnormal muscle tone.     Deep Tendon Reflexes: Reflexes normal.     Reflex Scores:      Tricep reflexes are 2+ on the right side and 2+ on the left side.      Bicep reflexes are 2+ on the right side and 2+ on the left side.      Brachioradialis reflexes are 2+ on the right side and 2+ on the left side.      Patellar reflexes are 2+ on the right side and 2+ on the left side. Psychiatric:        Behavior: Behavior normal.     Assessment/Plan  1. Essential hypertension Blood pressures well controlled.  Continue current  medication.  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 Chase Caller, [place] has been stable.  Increasing fatigue since stopping oxygen.  She has follow-up visit next week with pulmonology and will ask about a quieter oxygen system that she may be able to tolerate better at home.  3. Mixed hyperlipidemia We will recheck blood work to potential surgery.  Cholesterol has been stable.  4. Vertigo Orthostatics are stable.  She has had some improvement with Epley maneuver exercises at home, so I encouraged her to continue these.  I did offer physical therapy to  help with vertigo, but she declined.  She is getting benefit from Dramamine.  I have encouraged her to take time with position changes to avoid falls.  We discussed making sure she is staying well-hydrated also help with fall prevention. - EKG 12-Lead:EKG is sinus rhythm with occasional ectopic beat.  This is stable from prior in 2020, no acute changes.    5. Hernia of abdominal wall Mass abdominal wall more notable with standing.  Referral to surgery for further evaluation. - Ambulatory referral to General Surgery  She is medically stable for surgery.  We can order blood work if needed prior to surgery, but otherwise should not need repeat visit unless status changes.   Return in about 3 months (around 07/11/2020) for physical exam.   45 minutes total time spent on exam with patient, chart review, charting.  We also discussed importance of exercise.  I did give her information to contact [employer] for balance classes, which I encouraged her to do with her husband. Micheline Rough, MD

## 2020-04-11 NOTE — Patient Instructions (Addendum)
Adult Exercise Classes   1. [employer]: [email or web address] [phone]) a. All classes are free, but you have to register. Zoom classes are offered. Variety includes yoga, chair yoga, arthritis classes, Tai Chi, line dancing, balance classes, boot camp, and more!    2. [employer]  a. Daily chair exercises/Yoga classes     Benign Positional Vertigo  Vertigo is the feeling that you or your surroundings are moving when they are not. Benign positional vertigo is the most common form of vertigo. The cause of this condition is not serious (is benign). This condition is triggered by certain movements and positions (is positional). This condition can be dangerous if it occurs while you are doing something that could endanger you or others, such as driving. What are the causes? In many cases, the cause of this condition is not known. It may be caused by a disturbance in an area of the inner ear that helps your brain to sense movement and balance. This disturbance can be caused by a viral infection (labyrinthitis), head injury, or repetitive motion. What increases the risk? This condition is more likely to develop in:  Women.  People who are 48 years of age or older.   What are the signs or symptoms? Symptoms of this condition usually happen when you move your head or your eyes in different directions. Symptoms may start suddenly, and they usually last for less than a minute. Symptoms may include:  Loss of balance and falling.  Feeling like you are spinning or moving.  Feeling like your surroundings are spinning or moving.  Nausea and vomiting.  Blurred vision.  Dizziness.  Involuntary eye movement (nystagmus).   Symptoms can be mild and cause only slight annoyance, or they can be severe and interfere with daily life. Episodes of benign positional vertigo may return (recur) over time, and they may be triggered by certain movements. Symptoms may improve over time. How is this  diagnosed? This condition is usually diagnosed by medical history and a physical exam of the head, neck, and ears. You may be referred to a health care provider who specializes in ear, nose, and throat (ENT) problems (otolaryngologist) or a provider who specializes in disorders of the nervous system (neurologist). You may have additional testing, including:  MRI.  A CT scan.  Eye movement tests. Your health care provider may ask you to change positions quickly while he or she watches you for symptoms of benign positional vertigo, such as nystagmus. Eye movement may be tested with an electronystagmogram (ENG), caloric stimulation, the Dix-Hallpike test, or the roll test.  An electroencephalogram (EEG). This records electrical activity in your brain.  Hearing tests.   How is this treated? Usually, your health care provider will treat this by moving your head in specific positions to adjust your inner ear back to normal. Surgery may be needed in severe cases, but this is rare. In some cases, benign positional vertigo may resolve on its own in 2-4 weeks. Follow these instructions at home: Safety  Move slowly.Avoid sudden body or head movements.  Avoid driving.  Avoid operating heavy machinery.  Avoid doing any tasks that would be dangerous to you or others if a vertigo episode would occur.  If you have trouble walking or keeping your balance, try using a cane for stability. If you feel dizzy or unstable, sit down right away.  Return to your normal activities as told by your health care provider. Ask your health care provider what activities are safe  for you. General instructions  Take over-the-counter and prescription medicines only as told by your health care provider.  Avoid certain positions or movements as told by your health care provider.  Drink enough fluid to keep your urine clear or pale yellow.  Keep all follow-up visits as told by your health care provider. This is  important. Contact a health care provider if:  You have a fever.  Your condition gets worse or you develop new symptoms.  Your family or friends notice any behavioral changes.  Your nausea or vomiting gets worse.  You have numbness or a "pins and needles" sensation. Get help right away if:  You have difficulty speaking or moving.  You are always dizzy.  You faint.  You develop severe headaches.  You have weakness in your legs or arms.  You have changes in your hearing or vision.  You develop a stiff neck.  You develop sensitivity to light. This information is not intended to replace advice given to you by your health care provider. Make sure you discuss any questions you have with your health care provider. Document Released: 05/03/2006 Document Revised: 01/01/2016 Document Reviewed: 11/18/2014 Elsevier Interactive Patient Education  2018 [employer].    How to Perform the [place], [place] vertigo description and maneuver  The Epley maneuver is an exercise that relieves symptoms of vertigo. Vertigo is the feeling that you or your surroundings are moving when they are not. When you feel vertigo, you may feel like the room is spinning and have trouble walking. Dizziness is a little different than vertigo. When you are dizzy, you may feel unsteady or light-headed. You can do this maneuver at home whenever you have symptoms of vertigo. You can do it up to 3 times a day until your symptoms go away. Even though the Epley maneuver may relieve your vertigo for a few weeks, it is possible that your symptoms will return. This maneuver relieves vertigo, but it does not relieve dizziness. What are the risks? If it is done correctly, the Epley maneuver is considered safe. Sometimes it can lead to dizziness or nausea that goes away after a short time. If you develop other symptoms, such as changes in vision, weakness, or numbness, stop doing the maneuver and call your  health care provider. How to perform the Epley maneuver 1. Sit on the edge of a bed or table with your back straight and your legs extended or hanging over the edge of the bed or table. 2. Turn your head halfway toward the affected ear or side. 3. Lie backward quickly with your head turned until you are lying flat on your back. You may want to position a pillow under your shoulders. 4. Hold this position for 30 seconds. You may experience an attack of vertigo. This is normal. 5. Turn your head to the opposite direction until your unaffected ear is facing the floor. 6. Hold this position for 30 seconds. You may experience an attack of vertigo. This is normal. Hold this position until the vertigo stops. 7. Turn your whole body to the same side as your head. Hold for another 30 seconds. 8. Sit back up. You can repeat this exercise up to 3 times a day. Follow these instructions at home:  After doing the Epley maneuver, you can return to your normal activities.  Ask your health care provider if there is anything you should do at home to prevent vertigo. He or she may recommend that you: ?  Keep your head raised (elevated) with two or more pillows while you sleep. ? Do not sleep on the side of your affected ear. ? Get up slowly from bed. ? Avoid sudden movements during the day. ? Avoid extreme head movement, like looking up or bending over. Contact a health care provider if:  Your vertigo gets worse.  You have other symptoms, including: ? Nausea. ? Vomiting. ? Headache. Get help right away if:  You have vision changes.  You have a severe or worsening headache or neck pain.  You cannot stop vomiting.  You have new numbness or weakness in any part of your body. Summary  Vertigo is the feeling that you or your surroundings are moving when they are not.  The Epley maneuver is an exercise that relieves symptoms of vertigo.  If the Epley maneuver is done correctly, it is considered  safe. You can do it up to 3 times a day. This information is not intended to replace advice given to you by your health care provider. Make sure you discuss any questions you have with your health care provider. Document Released: 07/31/2013 Document Revised: 06/15/2016 Document Reviewed: 06/15/2016 Elsevier Interactive Patient Education  2017 [employer].

## 2020-04-15 ENCOUNTER — Encounter: Payer: Self-pay | Admitting: Internal Medicine

## 2020-04-15 ENCOUNTER — Telehealth: Payer: Self-pay | Admitting: Family Medicine

## 2020-04-15 ENCOUNTER — Other Ambulatory Visit: Payer: Self-pay

## 2020-04-15 ENCOUNTER — Ambulatory Visit: Payer: Medicare Other | Admitting: Internal Medicine

## 2020-04-15 VITALS — BP 130/90 | HR 78 | Temp 97.1°F | Ht 62.0 in | Wt 119.8 lb

## 2020-04-15 DIAGNOSIS — G4734 Idiopathic sleep related nonobstructive alveolar hypoventilation: Secondary | ICD-10-CM

## 2020-04-15 DIAGNOSIS — Z85118 Personal history of other malignant neoplasm of bronchus and lung: Secondary | ICD-10-CM

## 2020-04-15 DIAGNOSIS — R0902 Hypoxemia: Secondary | ICD-10-CM | POA: Diagnosis not present

## 2020-04-15 DIAGNOSIS — Z08 Encounter for follow-up examination after completed treatment for malignant neoplasm: Secondary | ICD-10-CM

## 2020-04-15 DIAGNOSIS — J449 Chronic obstructive pulmonary disease, unspecified: Secondary | ICD-10-CM | POA: Diagnosis not present

## 2020-04-15 DIAGNOSIS — E2839 Other primary ovarian failure: Secondary | ICD-10-CM

## 2020-04-15 MED ORDER — PROAIR HFA 108 (90 BASE) MCG/ACT IN AERS: INHALATION_SPRAY | RESPIRATORY_TRACT | 3 refills | Status: DC

## 2020-04-15 MED ORDER — BREZTRI AEROSPHERE 160-9-4.8 MCG/ACT IN AERO: 2.0000 | INHALATION_SPRAY | Freq: Two times a day (BID) | RESPIRATORY_TRACT | 0 refills | Status: DC

## 2020-04-15 NOTE — Telephone Encounter (Signed)
Pt wants to know if she can get a bone density test. Her last one was 05/2018   Please call her

## 2020-04-15 NOTE — Patient Instructions (Addendum)
Moderate COPD (chronic obstructive pulmonary disease), Sees Dr. Chase Caller, Pulm Encounter for follow-up surveillance of lung cancer   COPD -  - this is stable  But you do have night and exertionall hypoxemia -Glad Trelegy is working better for you. But did acknowledge you interested in the new inhaler BREZTRI  Plan  - try breztri 2 puf twice daily instead of Trelegy for 1 month  -Let us know via phone call if this is better for you - Use albuterol as needed.  -CMA to do new refill -Return the nighttime oxygen equipment because of the noise -Start portable oxygen 2 L nasal cannula  -Make a DME referral for this -Do not recommend Roflumilast or chronic daily prednisone at this point due to lack of repeated COPD exacerbations [prednisone should be of last resort and COPD]   Lung cancer surveillance:  -CT scan of the chest November 2020 without any cancer  Plan -- .  Repeat CT scan of the chest without contrast in December 2021  Plan -Please call us back in a few weeks if the new inhaler is working well for you - We will follow up with a CT scan chest results December 2021 - Otherwise 6 months or sooner if needed;  -  CAT score at follow-up

## 2020-04-15 NOTE — Addendum Note (Signed)
Addended by: Vanessa Barbara on: 04/15/2020 01:32 PM   Modules accepted: Orders

## 2020-04-15 NOTE — Addendum Note (Signed)
Addended by: Vanessa Barbara on: 04/15/2020 01:33 PM   Modules accepted: Orders

## 2020-04-15 NOTE — Addendum Note (Signed)
Addended by: Vanessa Barbara on: 04/15/2020 10:58 AM   Modules accepted: Orders

## 2020-04-15 NOTE — Progress Notes (Signed)
#COPD-MM genotype - 2013: Pre RUL lobectomy and Pre Rx: fev1 1.1L/60% , DLCO 9.6/56%, TLC 5.6/128%  - 2013 May: Post lobectomy and post Rx: fev1: 1.28L/60%, ratio 49 . No desat walk test - Rx spiriva, symbicort since spring 2013 - 2014 Nov: 06/22/2013. FEV1 is 1.3 L/62%. No post bronchodilator change. FVC is 2.6 L postop day later which is 95%. Ratio is 50. Total lung capacity is 4.96L/104%. DLCO is level 0.7/54%. - On synmbicort alone 04/05/2014 - Walk test today 185 feet x 3 laps on RA: Desat to < 88% at 2 laps - Change to [facility] plus Spiriva Respimat April 2016  # Stage 1 lung cancer: pT1b, pN0 Stage 08 October 2011 - s/p lobectomy by Dr Servando Snare - surveillance by Dr Earlie Server - 05/15/13 - Clear CT. No recurrence - October 2015 - clear CT,No recurrence  -Oct 2016 >no recurrence   09/11/2015 Follow up : COPD  Pt returns for 4 weeks follow up for COPD  PFT done today were reviewed with pt and showed  FEV 1 56%, ratio 49 , FVC 87%,  DLCO 43%  This is down slightly from 2014 (FEV1 62%)  CT chest in Oct 2016 did show mod emphysema .  She remains on Spiriva and BREO .  She has tried Symbicort and Dulera in past.  She says she does get winded with activity -but tried to stay active.  Has minimal cough . Sometimes has some wheezing and sinus drainge.  Denies chest pain, orthopnea, edema or hemoptysis .   Most recent CT chest Oct 2016 survelliance showed no cancer recurrence . Denies any chest pain, orthopnea, PND, leg swelling or hemoptysis.  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 She has questions regarding her prognosis .  Worried she will not be able to help him. "I need to out live him and be able to care for him"  She remains active with Swimming at [employer] and is part of the :"[employer]. "    OV 01/12/2016  Chief Complaint  Patient presents with  . Follow-up    Pt c/o some dry cough and SOB with exertion. Pt denies  wheeze/CP/tightness.    Moderate COPD lung cancer patient  Pulmonary function Feb 2017 showed Gold stage II COPD. CT scan of the chest October 2016 did not show any lung cancer recurrence. She is on triple inhaler therapy. She wants to switch to another version of triple inhaler therapy namely ANORO with and ICS. This is because of friend has had good experience with it. She continues her shortness of breath with exertion but is reluctant to try oxygen. We discussed nocturnal oxygen. She says she will consider the indigent system on her own. COPD cat score is 19. In the interim she has developed mild intention tremor with inhalers are not bothering her as much.   OV 06/08/2016  Chief Complaint  Patient presents with  . Follow-up    Pt state breathing is pretty good. Pt states congestion from allergiesThe [employer] work). Pt c/o no tightness in chest, SOB w/ excertion, Pt states some coughing because of dry air. loves the Anora but can not afford (doughnut hole)     She had CT scan of the chest 05/21/2016 and shows cancer in remission. No new infiltrates. She also had blood work 05/21/2016 with a normal creatinine of 0.8 mg percent and hemoglobin of 14.9 g percent. Overall COPD stable. She does not have any problems she is on triple inhaler therapy  which works well for her. She had baseline has exertional desaturations but is not using oxygen because of reluctance. She is planning to travel to [place] in [place] in 6 months and is inquiring about portable oxygen systems for her travel.   Walking desaturation test 185 feet 3 laps on room air 06/08/2016:Desaturated to 87% at the end of second lap   OV 03/02/2017  Chief Complaint  Patient presents with  . Follow-up    Pt states her breathing is unchanged since last OV. Pt c/o dry cough. Pt denies CP/tightness and f/c/s/.     Follow-up moderate COPD. Since last visit in October 2017 things are stable. She is on triple inhaler therapy. She canceled her  trip to [place]. He insists that she and her husband are going to [place] in [place] in September 2018. She has a follow-up scan for her lung cancer coming up in October 2018. This is from Dr. Julien Nordmann. She uses oxygen with exertion. She is started on the new shingles vaccine. Booster shot is due.  OV 08/31/2017  Chief Complaint  Patient presents with  . Follow-up    has occassional SOB,has non productive cough,uses inhaler anuity,anoro inhalers     Last visit July 2018.  There is a follow-up for moderate COPD.  She continues on triple inhaler therapy.  Since then her COPD CAT score is improved to 13.  Overall she is feeling fine without any exacerbations.  She has had to cancel her trip to [place] and [place] because of her husband's memory issues and back issues.  He is having back surgery in 2 weeks.  She plans to go to [place] and [place] and mid 2019.  She is up-to-date with her flu shot.  There are no main issues.  Earlier this year she had CT scan of the chest for lung cancer surveillance and there is no recurrence.  She has coronary artery calcification but she says she had a stress test in 2013 that was normal.  There is no active chest pain with exertion.      OV 06/09/2018  Subjective:  Patient ID: Renne Crigler, female , DOB: [birth year] , age 79 y.o. , MRN: [id number] , ADDRESS: [place] [id number]   06/09/2018 -   Chief Complaint  Patient presents with  . Follow-up    9 month follow up for COPD. Per patient her breathing has been up and down since her last visit. Cold weather doesn't seem to bother her. Wants to discuss a possible chest vest. Is requesting a CT chest since she hasn't one in almost 2 years.       HPI SORAYA PAQUETTE 79 y.o. -female   Moderate COPD follow-up: Doing well.  She feels symptoms are baseline and well-controlled.  Takes Anoro daily.  Takes Arnuity at night but occasionally will not take it.  Is up-to-date with the flu shot and  pneumonia vaccines.  Lung cancer surveillance: Last CT scan of the chest October 2018 without tumor recurrence.  She somewhat thinks it has been 2 years since she had a previous scan.  She is asking for repeat scan.  No other new issues   OV 05/23/2019  Subjective:  Patient ID: Ovidio Hanger, female , DOB: [birth year] , age 10 y.o. , MRN: [id number] , ADDRESS: [place] [id number]   05/23/2019 -   Chief Complaint  Patient presents with  . Moderate COPD (chronic obstructive pulmonary disease)    Has noticed  she is not able to work in yard as long as she could last year. But feels her medications are still working. Using the ProAir more than she used to.   -  Gold stage II COPD follow-up -Lung cancer screening follow-up  HPI Kathleen Varvara Legault 79 y.o. -presents for follow-up.  It is almost 1 year since I saw her.  She tells me in March 2020 she had influenza and pneumonia.  Since then she has had a deterioration in her symptoms.  She is not able to do yard work as much.  She is on Anoro and Arnuity and she is asking for something stronger.  Looking at his COPD CAT score it appears she is stable actually.  She is not on oxygen.  Her last CT scan was in November 2019 without any recurrence of lung cancer.  After that she did have cardiac stress test and this was normal.  She is neither on night oxygen or day oxygen.  She is interested in an inhaler upgrade.  She is also open to the idea of pulmonary rehabilitation.  She also tells me that it has been many years since she had pulmonary function test and she is open to getting this retested as well as testing her oxygen levels.   IMPRESSION: 1. No evidence of recurrent or metastatic disease. 2. Cholelithiasis. 3. Aortic atherosclerosis (ICD10-170.0). Three-vessel coronary artery calcification. 4. Enlarged pulmonary arteries, indicative of pulmonary arterial hypertension. 5.  Emphysema  (ICD10-J43.9).   Electronically Signed   By: Lorin Picket M.D.   On: 06/19/2018 11:34   OV 06/18/2019  Subjective:  Patient ID: Ovidio Hanger, female , DOB: Apr 09, 1941 , age 29 y.o. , MRN: [id number] , ADDRESS: [place] [id number]   06/18/2019 -   Chief Complaint  Patient presents with  . Follow-up    PFT performed 06/11/2019 and is here today to go over the results. Pt said that she has had some more SOB which is worse when she works outside and also has a dry cough.   Moderate COPD follow-up  HPI Andraya Frigon 79 y.o. -this visit is to follow-up for test results of overnight desaturation study, pulmonary function test and CT scan of the chest.  This is because she has moderate COPD and she started feeling worse.  Partly because she was not attending pulmonary rehabilitation in the midst of the pandemic.  [facility]  In addition last visit we switched her to Trelegy.  With this her symptoms are better subjectively although on the CAT score they seem to be a little bit worse.  She is liking Trelegy daily.  She is now using nighttime oxygen after oxygen and nighttime desaturation test was positive.  She has been using this for 1 week.  She does not notice any difference.  She is not sure she wants to take this.  Her last walking desaturation test was some years ago according to her history.  She is asking about other medications for COPD.  We discussed chronic daily prednisone and Roflumilast and decided against this.  This because she is not a frequent exacerbate her.  She was supposed to have CT scan of the chest but this is scheduled for next week.  This visit got scheduled ahead of time.  Her pulmonary function test shows some mild age-related decline in FEV1 and DLCO but otherwise unremarkable.  Walking desaturation test today 185 feet x 3 laps  on room air: She got to the lowest pulse ox of 89% and quickly bounced  back.  As she walked more she will drop to 88%.  But she does not want to take portable oxygen with her.     OV 04/15/2020   Subjective:  Patient ID: Ovidio Hanger, female , DOB: 05/25/1941, age 17 y.o. years. , MRN: [id number],  ADDRESS: [place] [address] PCP  Caren Macadam, MD Providers : Treatment Team:  Attending Provider: Brand Males, MD   Chief Complaint  Patient presents with  . Follow-up    breathing was better.  had oxygen picked up, it was loud and could not sleep.  less energy without the oxygen.  dry cough.  sob with exertion only.      Follow-up stage II COPD Follow-up lung cancer surveillance   HPI Chanta Bauers 79 y.o. -last seen November 2020.  After that she had CT scan of the chest that did not show any evidence of lung cancer.  She continues to do well.  She has nighttime oxygen but she says the machine is noisy and she wants to return it because she does not use it.  Last visit she had borderline desaturations.  This visit we walked her in very quickly and within half a lap she desaturated to 87%.  Recommended portable oxygen.  She is willing to use that but she wants to use the machine more at night.  She is using Trelegy inhaler.  She is asking for the competition inhaler BREZTRI after seeing advertisement on that on television.  I advised her that both the same.  She nevertheless she wants to try it if we have a sample.  There are no other new issues.  Her husband Bricelyn Freestone who is also my patient is continuing to have memory issues.    CAT Score 04/15/2020 05/23/2019  Total CAT Score 18 14      CAT COPD Symptom & Quality of Life Score (GSK trademark) 0 is no burden. 5 is highest burden 01/12/2016  08/31/2017  05/23/2019  06/18/2019   Never Cough -> Cough all the time3 2 2 2 3   No phlegm in chest -> Chest is full 1of phlegm 2 1 2 1   No chest tightness -> Chest feels v0ery tight 1 0 0 0  No dyspnea for 1 flight  stairs/hill -> V3ery dyspneic for 1 flight of stairs 4 3 4 3   No limitations for ADL at home -> Ver4y limited with ADL at home 2 3 2 4   Confident leaving home -> Not at all c1onfident leaving home 1 0 1 1  Sleep soundly -> Do not sleep soundly2 because of lung condition 3 2 1 2   Lots of Energy -> No energy at all 4 2 2 3   TOTAL Score (max 40)  19 13 14 17    PFT Results Latest Ref Rng & Units 06/11/2019 09/10/2015  FVC-Pre L 2.14 2.33  FVC-Predicted Pre % 84 87  FVC-Post L - 2.27  FVC-Predicted Post % - 84  Pre FEV1/FVC % % 49 49  Post FEV1/FCV % % - 49  FEV1-Pre L 1.04 1.14  FEV1-Predicted Pre % 55 56  FEV1-Post L - 1.12  DLCO uncorrected ml/min/mmHg 7.53 9.44  DLCO UNC% % 42 43  DLVA Predicted % 52 56  TLC L - 5.46  TLC % Predicted % - 115  RV % Predicted % - 143   CT chest Nov 2020  IMPRESSION: 1. Stable exam.  No new or progressive interval findings. 2.  Emphysema. (ICD10-J43.9) 3. Enlargement of the pulmonary arteries, compatible with pulmonary arterial hypertension. 4.  Aortic Atherosclerois (ICD10-170.0)   Electronically Signed   By: Misty Stanley M.D.   On: 06/25/2019 12:50   ROS - per HPI  Simple office walk 185 feet x  3 laps goal with forehead probe 04/15/2020   O2 used ra  Number laps completed 1 lap of 3  Comments about pace Slow pace. tropped over cane x 3  Resting Pulse Ox/HR 98% and 77/min  Final Pulse Ox/HR 87% and 101/min  Desaturated </= 88% yes  Desaturated <= 3% points yes  Got Tachycardic >/= 90/min yes  Symptoms at end of test Dyspneic at half lap  Miscellaneous comments desats at half lap but completed 1 laps      ROS - per HPI     has a past medical history of Allergic rhinitis, Anemia, Arthritis, Bilateral cataracts, Bronchogenic lung cancer ([place]) (09/12/2011), Chicken pox, Cholelithiasis (05/21/2014), COPD (chronic obstructive pulmonary disease) ([place]), Depression, Gout, History of blood transfusion, Hot flashes, migraines (90's),  Hyperlipidemia, Hypertension, IBS (irritable bowel syndrome), Liver cyst, Lung cancer ([place]), Macrocytosis, Migraine, Osteopenia, Pneumonia, PONV (postoperative nausea and vomiting), S/P right THA, AA - Dr. 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   reports that she quit smoking about 8 years ago. Her smoking use included cigarettes. She has a 25.00 pack-year smoking history. She has never used smokeless tobacco.  Past Surgical History:  Procedure Laterality Date  . CARPAL TUNNEL RELEASE  80's   right   . COLONOSCOPY    . cyst removed  >72yrs ago   from left leg  . DILATION AND CURETTAGE OF UTERUS     at age 45  . EXCISIONAL TOTAL KNEE ARTHROPLASTY Right 06/03/2014   Procedure: RIGHT KNEE SCAR EXCISION;  Surgeon: [name], MD;  Location: [facility];  Service: Orthopedics;  Laterality: Right;  . INGUINAL HERNIA REPAIR  2000  . ROTATOR CUFF REPAIR  2001   right  . TONSILLECTOMY  as a child   and adenoids  . TOTAL ABDOMINAL HYSTERECTOMY  2003   benign tumor-[facility]  . TOTAL HIP ARTHROPLASTY  2011   left  . TOTAL HIP ARTHROPLASTY  03/07/2012   Procedure: TOTAL HIP ARTHROPLASTY ANTERIOR APPROACH;  Surgeon: [name], MD;  Location: [facility];  Service: Orthopedics;  Laterality: Right;  . TOTAL KNEE ARTHROPLASTY Right 01/02/2013   Procedure: RIGHT TOTAL KNEE ARTHROPLASTY;  Surgeon: [name], MD;  Location: [facility];  Service: Orthopedics;  Laterality: Right;  . TOTAL KNEE ARTHROPLASTY Left 04/03/2013   Procedure: LEFT TOTAL KNEE ARTHROPLASTY;  Surgeon: [name], MD;  Location: [facility];  Service: Orthopedics;  Laterality: Left;  Marland Kitchen VIDEO BRONCHOSCOPY  10/20/2011   Procedure: VIDEO BRONCHOSCOPY;  Surgeon: Grace Isaac, MD;  Location: [facility];  Service: Thoracic;  Laterality: N/A;  . WEDGE RESECTION  10-20-2011   rt lung  - for lung cancer    Allergies  Allergen Reactions  . Clindamycin/Lincomycin Nausea And Vomiting  .  Penicillins Rash       . Spiriva [Tiotropium Bromide Monohydrate]     General intolerance  . Sulfa Antibiotics Hives and Rash    Severe Hives with skin reaction    Immunization History  Administered Date(s) Administered  . Fluad Quad(high Dose 65+) 04/10/2019  . Influenza Split 06/02/2011, 05/09/2012  . Influenza,  High Dose Seasonal PF 04/19/2016, 04/27/2017, 05/18/2018  . Influenza,inj,Quad PF,6+ Mos 05/18/2013, 04/17/2014, 05/21/2015  . PFIZER SARS-COV-2 Vaccination 09/15/2019, 10/10/2019  . Pneumococcal Conjugate-13 04/05/2014  . Pneumococcal Polysaccharide-23 08/09/1998, 09/21/2011  . Tdap 06/22/2012  . Zoster 08/10/2007    Family History  Problem Relation Age of Onset  . Other Mother        polyarteritis  . Bowel Disease Father 35       colectomy/bowel obstruction issues  . Heart attack Father   . Hypertension Brother   . Arthritis Brother   . Post-traumatic stress disorder Brother   . Alcohol abuse Other   . Depression Other   . Hearing loss Other   . Heart disease Other   . Hypertension Other   . Anesthesia problems Neg Hx   . Hypotension Neg Hx   . Malignant hyperthermia Neg Hx   . Pseudochol deficiency Neg Hx   . Breast cancer Neg Hx      Current Outpatient Medications:  .  Acetaminophen (TYLENOL PO), Take by mouth as needed. Alternates with Ibuprofen, Disp: , Rfl:  .  amLODipine (NORVASC) 5 MG tablet, TAKE 1 TABLET BY MOUTH AT BEDTIME, Disp: 90 tablet, Rfl: 0 .  Cholecalciferol (VITAMIN D-3 PO), Take 1,200 mg by mouth every morning., Disp: , Rfl:  .  Cyanocobalamin (VITAMIN B-12 PO), Take by mouth., Disp: , Rfl:  .  FLUoxetine (PROZAC) 20 MG capsule, Take 1 capsule by mouth twice daily, Disp: 180 capsule, Rfl: 0 .  fluticasone (FLONASE) 50 MCG/ACT nasal spray, Place 2 sprays into both nostrils daily. (Patient taking differently: Place 2 sprays into both nostrils daily as needed. ), Disp: 16 g, Rfl: 5 .  Glucosamine 500 MG CAPS, Take 1,200 mg by mouth 2  (two) times daily., Disp: , Rfl:  .  OVER THE COUNTER MEDICATION, Memory supplement-once a day, Disp: , Rfl:  .  PROAIR HFA 108 (90 Base) MCG/ACT inhaler, INHALE 2 PUFFS BY MOUTH EVERY 6 HOURS AS NEEDED FOR WHEEZING OR SHORTNESS OF BREATH, Disp: 1 Inhaler, Rfl: 1 .  Probiotic Product (PROBIOTIC DAILY PO), Take by mouth daily. IB Guard, Disp: , Rfl:  .  rosuvastatin (CRESTOR) 5 MG tablet, Take 1 tablet by mouth once daily, Disp: 90 tablet, Rfl: 0 .  sodium chloride (OCEAN) 0.65 % SOLN nasal spray, Place 1 spray into both nostrils daily as needed for congestion. , Disp: , Rfl:  .  TRELEGY ELLIPTA 100-62.5-25 MCG/INH AEPB, INHALE 1 PUFF ONCE DAILY, Disp: 60 each, Rfl: 2 .  vitamin E 400 UNIT capsule, Take 400 Units by mouth daily., Disp: , Rfl:       Objective:   Vitals:   04/15/20 0939  BP: 130/90  Pulse: 78  Temp: (!) 97.1 F (36.2 C)  TempSrc: Temporal  SpO2: 92%  Weight: 119 lb 12.8 oz (54.3 kg)  Height: 5\' 2"  (1.575 m)    Estimated body mass index is 21.91 kg/m as calculated from the following:   Height as of this encounter: 5\' 2"  (1.575 m).   Weight as of this encounter: 119 lb 12.8 oz (54.3 kg).  @WEIGHTCHANGE @  [employer]   04/15/20 0939  Weight: 119 lb 12.8 oz (54.3 kg)     Physical Exam  General Appearance:    Alert, cooperative, no distress, appears stated age - yes , Deconditioned looking - no , OBESE  - no, Sitting on Wheelchair -  no  Head:    Normocephalic, without obvious abnormality, atraumatic  Eyes:    PERRL, conjunctiva/corneas clear,  Ears:    Normal TM's and external ear canals, both ears  Nose:   Nares normal, septum midline, mucosa normal, no drainage    or sinus tenderness. OXYGEN ON  - no . Patient is @ ra   Throat:   Lips, mucosa, and tongue normal; teeth and gums normal. Cyanosis on lips - no  Neck:   Supple, symmetrical, trachea midline, no adenopathy;    thyroid:  no enlargement/tenderness/nodules; no carotid   bruit or JVD  Back:      Symmetric, no curvature, ROM normal, no CVA tenderness  Lungs:     Distress - no , Wheeze no, Barrell Chest - no, Purse lip breathing - no, Crackles - no   Chest Wall:    No tenderness or deformity.    Heart:    Regular rate and rhythm, S1 and S2 normal, no rub   or gallop, Murmur - no  Breast Exam:    NOT DONE  Abdomen:     Soft, non-tender, bowel sounds active all four quadrants,    no masses, no organomegaly. Visceral obesity - no  Genitalia:   NOT DONE  Rectal:   NOT DONE  Extremities:   Extremities - normal, Has Cane - YES, Clubbing -no, Edema - no  Pulses:   2+ and symmetric all extremities  Skin:   Stigmata of Connective Tissue Disease - no  Lymph nodes:   Cervical, supraclavicular, and axillary nodes normal  Psychiatric:  Neurologic:   Pleasant - yes, Anxious - no, Flat affect - no  CAm-ICU - neg, Alert and Oriented x 3 - yes, Moves all 4s - yes, Speech - normal, Cognition - intact           Assessment:       ICD-10-CM   1. Moderate COPD (chronic obstructive pulmonary disease) (HCC)  J44.9   2. Exercise hypoxemia  R09.02   3. Nocturnal hypoxia  G47.34   4. Encounter for follow-up surveillance of lung cancer  Z08    Z85.118        Plan:     Patient Instructions  Moderate COPD (chronic obstructive pulmonary disease), Sees Dr. Chase Caller, Pulm Encounter for follow-up surveillance of lung cancer   COPD -  - this is stable  But you do have night and exertionall hypoxemia -Glad Trelegy is working better for you. But did acknowledge you interested in the new inhaler BREZTRI  Plan  - try breztri 2 puf twice daily instead of Trelegy for 1 month  -Let us know via phone call if this is better for you - Use albuterol as needed.  -CMA to do new refill -Return the nighttime oxygen equipment because of the noise -Start portable oxygen 2 L nasal cannula  -Make a DME referral for this -Do not recommend Roflumilast or chronic daily prednisone at this point due to lack of  repeated COPD exacerbations [prednisone should be of last resort and COPD]   Lung cancer surveillance:  -CT scan of the chest November 2020 without any cancer  Plan -- .  Repeat CT scan of the chest without contrast in December 2021  Plan -Please call us back in a few weeks if the new inhaler is working well for you - We will follow up with a CT scan chest results December 2021 - Otherwise 6 months or sooner if needed;  -  CAT score at follow-up     SIGNATURE    Dr.  Brand Males, M.D., F.C.C.P,  Pulmonary and Critical Care Medicine Staff Physician, [place] Director - Interstitial Lung Disease  Program  [facility] [place] [facility] [place], [place], [address]  Pager: [phone], If no answer or between  15:00h - 7:00h: call [phone] Telephone: [phone]  10:14 AM 04/15/2020

## 2020-04-15 NOTE — Addendum Note (Signed)
Addended by: Vanessa Barbara on: 04/15/2020 12:52 PM   Modules accepted: Orders

## 2020-04-16 NOTE — Addendum Note (Signed)
Addended by: Agnes Lawrence on: 04/16/2020 03:21 PM   Modules accepted: Orders

## 2020-04-16 NOTE — Telephone Encounter (Signed)
Spoke with the pt and informed her of the message below.  Order entered and the pt is aware someone will call with appt info.

## 2020-04-16 NOTE — Telephone Encounter (Signed)
Ok to order this for her; we try not to repeat too soon so that we can get a good impression about whether change we are seeing is accurate. Last one was end of October in 2019; so could plan for November study this year to give 2 year minimum time. Dx estrogen deficiency.

## 2020-04-21 ENCOUNTER — Encounter (HOSPITAL_COMMUNITY): Payer: Self-pay

## 2020-04-21 ENCOUNTER — Other Ambulatory Visit: Payer: Self-pay

## 2020-04-21 DIAGNOSIS — S41111A Laceration without foreign body of right upper arm, initial encounter: Secondary | ICD-10-CM | POA: Insufficient documentation

## 2020-04-21 DIAGNOSIS — Z5321 Procedure and treatment not carried out due to patient leaving prior to being seen by health care provider: Secondary | ICD-10-CM | POA: Diagnosis not present

## 2020-04-21 DIAGNOSIS — W19XXXA Unspecified fall, initial encounter: Secondary | ICD-10-CM | POA: Diagnosis not present

## 2020-04-21 NOTE — ED Triage Notes (Signed)
Pt reports a fall last Wednesday and has a skin tear on right upper arm approx 3"x 2".

## 2020-04-22 ENCOUNTER — Emergency Department (HOSPITAL_COMMUNITY)
Admission: EM | Admit: 2020-04-22 | Discharge: 2020-04-22 | Disposition: A | Discharge status: Home / Self Care | Payer: Medicare Other | Attending: Emergency Medicine | Admitting: Emergency Medicine

## 2020-04-25 ENCOUNTER — Telehealth: Payer: Self-pay | Admitting: Internal Medicine

## 2020-04-25 MED ORDER — BREZTRI AEROSPHERE 160-9-4.8 MCG/ACT IN AERO: 2.0000 | INHALATION_SPRAY | Freq: Two times a day (BID) | RESPIRATORY_TRACT | 3 refills | Status: DC

## 2020-04-25 NOTE — Telephone Encounter (Signed)
Called and spoke with Patient.  Patient stated [name] works great.  Patient requested a Bretri prescription to be sent to [facility]. Requested prescription sent to pharmacy. Nothing further at this time.

## 2020-06-02 ENCOUNTER — Ambulatory Visit: Payer: Self-pay | Admitting: Surgery

## 2020-06-02 DIAGNOSIS — K439 Ventral hernia without obstruction or gangrene: Secondary | ICD-10-CM | POA: Diagnosis not present

## 2020-06-02 DIAGNOSIS — J449 Chronic obstructive pulmonary disease, unspecified: Secondary | ICD-10-CM | POA: Diagnosis not present

## 2020-06-02 NOTE — H&P (Signed)
[name] Appointment: 06/02/2020 10:15 AM Location: [facility] Patient #: [id number] DOB: 1940/11/02 Married / Language: [name] / Race: White Female  History of Present Illness Angela Hector MD; 06/02/2020 1:13 PM) The patient is a 79 year old female who presents for an evaluation of a hernia. Note for "Hernia": ` ` ` Patient sent for surgical consultation at the request of Caren Macadam, MD  Chief Complaint: Left lower abdominal swelling. Possible hernia. ` ` The patient is a pleasant woman history of COPD. Walks with a cane. Tries to avoid using oxygen. 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 She's noticed some swelling in her lower abdomen. Concern for hernia. Discussed with primary care physician. Hernia suspected. Surgical consultation offered. She is followed by Dr. Chase Caller with [facility]. Recently switched inhalers and claims to be doing well. There is been recommendation for oxygen at night that she does not use it. She can maybe walk a block or 2 with a cane before she has to stop due to joint pain. She no longer smokes. Diabetes. She recalls having some hernia repair when she was up in [place] in the 1990s. I'm guessing it was at the bellybutton. Normally moves her bowels several times a day. Drinks about a pot of coffee a day.   No personal nor family history of GI/colon cancer, inflammatory bowel disease, irritable bowel syndrome, allergy such as Celiac Sprue, dietary/dairy problems, colitis, ulcers nor gastritis. No recent sick contacts/gastroenteritis. No travel outside the country. No changes in diet. No dysphagia to solids or liquids. No significant heartburn or reflux. No melena, hematemesis, coffee ground emesis. No evidence of prior gastric/peptic ulceration.  (Review of systems as stated in this history (HPI) or in the review of systems. Otherwise all other 12 point ROS are  negative) ` ` ###########################################`  This patient encounter took 30 minutes today to perform the following: obtain history, perform exam, review outside records, interpret tests & imaging, counsel the patient on their diagnosis; and, document this encounter, including findings & plan in the electronic health record (EHR).   Past Surgical History Darden Palmer, [place]; 06/02/2020 10:41 AM) Cataract Surgery Bilateral. Hysterectomy (not due to cancer) - Complete Knee Surgery Bilateral. Lung Surgery Right. Nephrectomy Bilateral. Shoulder Surgery Bilateral. Tonsillectomy  Diagnostic Studies History Darden Palmer, [place]; 06/02/2020 10:41 AM) Colonoscopy 1-5 years ago Mammogram within last year Pap Smear >5 years ago  Allergies Darden Palmer, RMA; 06/02/2020 10:44 AM) Clindamycin HCl *CHEMICALS* Nausea, Vomiting. Penicillin G Pot in Dextrose *PENICILLINS* Rash. Spiriva HandiHaler *ANTIASTHMATIC AND BRONCHODILATOR AGENTS* Sulfacetamide *CHEMICALS* Hives, Rash. Allergies Reconciled  Medication History Darden Palmer, [place]; 06/02/2020 10:47 AM) amLODIPine Besylate (5MG  Tablet, Oral) Active. Tylenol (Oral) Specific strength unknown - Active. Budeson-Glycopyrrol-Formoterol (160-9-4.8MCG/ACT Aerosol, Inhalation) Active. FLUoxetine HCl (20MG  Capsule, Oral) Active. ProAir HFA (108 (90 Base)MCG/ACT Aerosol Soln, Inhalation) Active. Rosuvastatin Calcium (5MG  Tablet, Oral) Active. Medications Reconciled  Social History Darden Palmer, [place]; 06/02/2020 10:41 AM) Alcohol use Moderate alcohol use. Caffeine use Coffee. No drug use Tobacco use Former smoker.  Family History Darden Palmer, [place]; 06/02/2020 10:41 AM) Arthritis Brother, Father. Heart Disease Brother, Father. Respiratory Condition Father.  Pregnancy / Birth History Darden Palmer, [place]; 06/02/2020 10:41 AM) Age at menarche 64 years. Age of menopause 51-55 Contraceptive  History Oral contraceptives. Gravida 2 Irregular periods Maternal age 61-20 Para 0  Other Problems Darden Palmer, [place]; 06/02/2020 10:41 AM) Arthritis Back Pain Chronic Obstructive Lung Disease Depression High blood pressure Inguinal Hernia Lung Cancer Migraine  Headache Oophorectomy     Review of Systems Lattie Haw [place] RMA; 06/02/2020 10:41 AM) General Not Present- Appetite Loss, Chills, Fatigue, Fever, Night Sweats, Weight Gain and Weight Loss. Skin Not Present- Change in Wart/Mole, Dryness, Hives, Jaundice, New Lesions, Non-Healing Wounds, Rash and Ulcer. HEENT Present- Seasonal Allergies and Wears glasses/contact lenses. Not Present- Earache, Hearing Loss, Hoarseness, Nose Bleed, Oral Ulcers, Ringing in the Ears, Sinus Pain, Sore Throat, Visual Disturbances and Yellow Eyes. Respiratory Present- Chronic Cough and Difficulty Breathing. Not Present- Bloody sputum, Snoring and Wheezing. Breast Not Present- Breast Mass, Breast Pain, Nipple Discharge and Skin Changes. Cardiovascular Not Present- Chest Pain, Difficulty Breathing Lying Down, Leg Cramps, Palpitations, Rapid Heart Rate, Shortness of Breath and Swelling of Extremities. Gastrointestinal Present- Chronic diarrhea. Not Present- Abdominal Pain, Bloating, Bloody Stool, Change in Bowel Habits, Constipation, Difficulty Swallowing, Excessive gas, Gets full quickly at meals, Hemorrhoids, Indigestion, Nausea, Rectal Pain and Vomiting. Female Genitourinary Not Present- Frequency, Nocturia, Painful Urination, Pelvic Pain and Urgency. Musculoskeletal Present- Back Pain, Joint Pain and Joint Stiffness. Not Present- Muscle Pain, Muscle Weakness and Swelling of Extremities. Neurological Present- Trouble walking and Weakness. Not Present- Decreased Memory, Fainting, Headaches, Numbness, Seizures, Tingling and Tremor. Psychiatric Not Present- Anxiety, Bipolar, Change in Sleep Pattern, Depression, Fearful and Frequent  crying. Endocrine Not Present- Cold Intolerance, Excessive Hunger, Hair Changes, Heat Intolerance, Hot flashes and New Diabetes. Hematology Not Present- Blood Thinners, Easy Bruising, Excessive bleeding, Gland problems, HIV and Persistent Infections.  Vitals Lattie Haw [place] RMA; 06/02/2020 10:44 AM) 06/02/2020 10:43 AM Weight: 119.38 lb Height: 62in Body Surface Area: 1.54 m Body Mass Index: 21.83 kg/m  Temp.: 13F  Pulse: 76 (Regular)  P.OX: 94% (Room air) BP: 106/68(Sitting, Left Arm, Standard)        Physical Exam Angela Hector MD; 06/02/2020 1:14 PM)  General Mental Status-Alert. General Appearance-Not in acute distress, Not Sickly. Orientation-Oriented X3. Hydration-Well hydrated. Voice-Normal.  Integumentary Global Assessment Upon inspection and palpation of skin surfaces of the - Axillae: non-tender, no inflammation or ulceration, no drainage. and Distribution of scalp and body hair is normal. General Characteristics Temperature - normal warmth is noted.  Head and Neck Head-normocephalic, atraumatic with no lesions or palpable masses. Face Global Assessment - atraumatic, no absence of expression. Neck Global Assessment - no abnormal movements, no bruit auscultated on the right, no bruit auscultated on the left, no decreased range of motion, non-tender. Trachea-midline. Thyroid Gland Characteristics - non-tender.  Eye Eyeball - Left-Extraocular movements intact, No Nystagmus - Left. Eyeball - Right-Extraocular movements intact, No Nystagmus - Right. Cornea - Left-No Hazy - Left. Cornea - Right-No Hazy - Right. Sclera/Conjunctiva - Left-No scleral icterus, No Discharge - Left. Sclera/Conjunctiva - Right-No scleral icterus, No Discharge - Right. Pupil - Left-Direct reaction to light normal. Pupil - Right-Direct reaction to light normal. Note: Wears glasses. Vision corrected  ENMT Ears Pinna - Left - no drainage  observed, no generalized tenderness observed. Pinna - Right - no drainage observed, no generalized tenderness observed. Nose and Sinuses External Inspection of the Nose - no destructive lesion observed. Inspection of the nares - Left - quiet respiration. Inspection of the nares - Right - quiet respiration. Mouth and Throat Lips - Upper Lip - no fissures observed, no pallor noted. Lower Lip - no fissures observed, no pallor noted. Nasopharynx - no discharge present. Oral Cavity/Oropharynx - Tongue - no dryness observed. Oral Mucosa - no cyanosis observed. Hypopharynx - no evidence of airway distress observed.  Chest and Lung Exam Inspection Movements -  Normal and Symmetrical. Accessory muscles - No use of accessory muscles in breathing. Palpation Palpation of the chest reveals - Non-tender. Auscultation Breath sounds - Normal and Clear.  Cardiovascular Auscultation Rhythm - Regular. Murmurs & Other Heart Sounds - Auscultation of the heart reveals - No Murmurs and No Systolic Clicks.  Abdomen Inspection Inspection of the abdomen reveals - No Visible peristalsis and No Abnormal pulsations. Umbilicus - No Bleeding, No Urine drainage. Palpation/Percussion Palpation and Percussion of the abdomen reveal - Soft, Non Tender, No Rebound tenderness, No Rigidity (guarding) and No Cutaneous hyperesthesia. Note: Abdomen soft. Not severely distended. Periumbilical vertical incision. No obvious. Umbilical hernia but in the left infraumbilical paramedian region is some fullness suspicious for a possible spigelian hernia. Along left lower quadrant somewhat lateral is an obvious hernia that reduces down. Somewhat lateral for an inguinal hernia but possibly given her stretched out abdomen.  Female Genitourinary Sexual Maturity Tanner 5 - Adult hair pattern. Note: No vaginal bleeding nor discharge. Bulging in left lateral groin. Possible low Spigelian hernia versus an indirect inguinal hernia on the left  side. No obvious bulging or hernia on the right  Peripheral Vascular Upper Extremity Inspection - Left - No Cyanotic nailbeds - Left, Not Ischemic. Inspection - Right - No Cyanotic nailbeds - Right, Not Ischemic.  Neurologic Neurologic evaluation reveals -normal attention span and ability to concentrate, able to name objects and repeat phrases. Appropriate fund of knowledge , normal sensation and normal coordination. Mental Status Affect - not angry, not paranoid. Cranial Nerves-Normal Bilaterally. Gait-Normal.  Neuropsychiatric Mental status exam performed with findings of-able to articulate well with normal speech/language, rate, volume and coherence, thought content normal with ability to perform basic computations and apply abstract reasoning and no evidence of hallucinations, delusions, obsessions or homicidal/suicidal ideation.  Musculoskeletal Global Assessment Spine, Ribs and Pelvis - no instability, subluxation or laxity. Right Upper Extremity - no instability, subluxation or laxity.  Lymphatic Head & Neck  General Head & Neck Lymphatics: Bilateral - Description - No Localized lymphadenopathy. Axillary  General Axillary Region: Bilateral - Description - No Localized lymphadenopathy. Femoral & Inguinal  Generalized Femoral & Inguinal Lymphatics: Left - Description - No Localized lymphadenopathy. Right - Description - No Localized lymphadenopathy.    Assessment & Plan Angela Hector MD; 06/02/2020 1:15 PM)  SPIGELIAN HERNIA (K43.9) Impression: Left lower quadrant hernia. Hard to know if this is a low Spigelian hernia versus an upper inguinal. I would do diagnostic laparoscopy with repair of hernias found. May be combined intraperitoneal and preperitoneal TAPP/TEP approach depending on what I find.  I do not think it'll be technically difficult but her obvious biggest risk is with her COPD and lungs. I need to do this under general anesthesia.  She wishes to be  aggressive to nip this in the bud so she can be there to help take care of her husband. She understands she will not be independent for several weeks and is already recruiting help from friends and family to help take care of her husband and her.  We'll double check that she is cleared from a pulmonary standpoint. Just saw her pulmonologist last month, some probably okay.   VENTRAL HERNIA WITHOUT OBSTRUCTION OR GANGRENE (K43.9) Impression: Slightly left infraumbilical mass suspicious for ventral hernia. 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 Has declined home oxygen. We'll double check  with her pulmonologist, Dr. Chase Caller, that she is a reasonable risk for hernia repair  Current Plans I recommended obtaining preoperative pulmoanry clearance. I am concerned about the health of the patient and the ability to tolerate the operation. Therefore, we will request clearance by pulmonary better assess operative risk & see if a reevaluation, further workup, etc is needed. Also recommendations on how medications and therapies should be managed/held/restarted after surgery.  PREOP - [place] - ENCOUNTER FOR PREOPERATIVE EXAMINATION FOR GENERAL SURGICAL PROCEDURE (Z01.818)  Current Plans You are being scheduled for surgery- Our schedulers will call you.  You should hear from our office's scheduling department within 5 working days about the location, date, and time of surgery. We try to make accommodations for patient's preferences in scheduling surgery, but sometimes the OR schedule or the surgeon's schedule prevents [place] from making those accommodations.  If you have not heard from our office [phone]) in 5 working days, call the office and ask for your surgeon's nurse.  If you have other questions about your diagnosis, plan, or surgery, call the  office and ask for your surgeon's nurse.  Written instructions provided [facility] Consent - Hernia Repair - Ventral/Incisional/Umbilical (Braelyn Bordonaro): discussed with patient and provided information. Pt Education - [facility] Hernia Post-Op HCI (Uchenna Seufert): discussed with patient and provided information. Pt Education - [facility] Pain Control (Albie Bazin) Pt Education - Pamphlet Given - Laparoscopic Hernia Repair: discussed with patient and provided information. Pt Education - [facility] Mesh education: discussed with patient and provided information.  Angela Hector, MD, FACS, MASCRS Gastrointestinal and Minimally Invasive Surgery  [facility] [address], [place], [place] [address] [phone] Fax [phone] Main/Paging  CONTACT INFORMATION: Weekday (9AM-5PM) concerns: Call [facility] main office at [phone] Weeknight (5PM-9AM) or Weekend/Holiday concerns: Check [email or web address] for General Surgery [facility] coverage (Please, do not use SecureChat as it is not reliable communication to operating surgeons for immediate patient care)

## 2020-06-12 ENCOUNTER — Telehealth: Payer: Self-pay | Admitting: Internal Medicine

## 2020-06-12 NOTE — Telephone Encounter (Signed)
Please schedule a tele or virtual or face to visit with an app -for pulmonary clearance prior to hernia repair with mesh.  The pulmonary clearance needs to be faxed to [phone] attention Lorita Officer, CMA of Dr. Annie Main gross

## 2020-06-13 NOTE — Telephone Encounter (Signed)
Attempted to call pt but unable to reach. Left message for her to return call. 

## 2020-06-16 ENCOUNTER — Ambulatory Visit (INDEPENDENT_AMBULATORY_CARE_PROVIDER_SITE_OTHER): Payer: Medicare Other | Admitting: Primary Care

## 2020-06-16 ENCOUNTER — Encounter: Payer: Self-pay | Admitting: Primary Care

## 2020-06-16 ENCOUNTER — Other Ambulatory Visit: Payer: Self-pay

## 2020-06-16 DIAGNOSIS — J449 Chronic obstructive pulmonary disease, unspecified: Secondary | ICD-10-CM

## 2020-06-16 DIAGNOSIS — Z01811 Encounter for preprocedural respiratory examination: Secondary | ICD-10-CM

## 2020-06-16 NOTE — Patient Instructions (Signed)
  Major Pulmonary risks identified in the multifactorial risk analysis are but not limited to a) pneumonia; b) recurrent intubation risk; c) prolonged or recurrent acute respiratory failure needing mechanical ventilation; d) prolonged hospitalization; e) DVT/Pulmonary embolism; f) Acute Pulmonary edema  Recommend 1. Short duration of surgery as much as possible and avoid paralytic if possible 2. Recovery in step down or ICU with Pulmonary consultation 3. DVT prophylaxis 4. Aggressive pulmonary toilet with o2, bronchodilatation, and incentive spirometry and early ambulation   Follow-up: As needed/ Pulmonary clearance needs to be faxed to [phone] Attn: Elmo Putt Spillers/ Dr. Alwyn Pea

## 2020-06-16 NOTE — Progress Notes (Signed)
Virtual Visit via Telephone Note  I connected with [name] on 06/16/20 at  2:00 PM EST by telephone and verified that I am speaking with the correct person using two identifiers.  Location: Patient: Home Provider: Office   I discussed the limitations, risks, security and privacy concerns of performing an evaluation and management service by telephone and the availability of in person appointments. I also discussed with the patient that there may be a patient responsible charge related to this service. The patient expressed understanding and agreed to proceed.   History of Present Illness: 79 year old female, former smoker quit in 2013 (25 pack year hx). PMH significant for moderate COPD, lung cancer, HTN, coronary artery calcifications, hyperlipidemia. Patient of Dr. Chase Caller, last seen on 04/15/20. S/p lobectomy with Dr. Servando Snare in 2013/surveillance with Dr. Earlie Server with no reoccurrence as of October 2016.   06/16/2020- Interim hx Patient contacted today for televisit/surigcal clearance for hernia repair with mesh. Stable course, breathing improved on triple therapy. No recent respiratory infections in the last month. She has a chronic cough which is dry. Maintained on Breztri two puff twice daily. She uses her Albuterol rescue 1-2 times a week max. She is independent, active and goes to the gym twice a week. She is not on oxygen. She is the main caregiver for her husband. She has received both covid-19 vaccine, due for booster and influenza vaccine.  Denies f/c/s, chest tightness, wheezing.     Observations/Objective:  - Able to speak in full sentences; no overt shortness of breath, wheezing or cough   Pulmonary function testing: 09/11/15- FEV 1 56%, ratio 49 , FVC 87%,  DLCO 43%  This is down slightly from 2014 (FEV1 62%)   Imaging: CT chest in Oct 2016 did show mod emphysema   Assessment and Plan:  COPD GOLD II: - Stable interval, patient is compliant with medication  regiment. Rare SABA use.  - Continue BREZTRI inhaler two puffs twice daily  - She has received both covid-19 vaccine, due for booster and influenza vaccine.  Pre-op respiratory assessment:  - Patient is cleared by pulmonary for hernia repair, date TBD. She is an intermediate risk for post-op pulmonary complicagtions d/t age, COPD and type of surgery. She is independent, active and not on oxygen. She has had no recent respiratory infections.  - Encouraged early post-op ambulation, consistent/frequent use of incentive spirometer and compression stockings - Pulmonary clearance needs to be faxed to [phone] Attn: Alicia Spillers/ Dr. Alwyn Pea  Major Pulmonary risks identified in the multifactorial risk analysis are but not limited to a) pneumonia; b) recurrent intubation risk; c) prolonged or recurrent acute respiratory failure needing mechanical ventilation; d) prolonged hospitalization; e) DVT/Pulmonary embolism; f) Acute Pulmonary edema  Recommend 1. Short duration of surgery as much as possible and avoid paralytic if possible 2. Recovery in step down or ICU with Pulmonary consultation if needed  3. DVT prophylaxis 4. Aggressive pulmonary toilet with o2, bronchodilatation, and incentive spirometry and early ambulation   1) RISK FOR PROLONGED MECHANICAL VENTILAION - > 48h  1A) Arozullah - Prolonged mech ventilation risk Arozullah Postperative Pulmonary Risk Score - for mech ventilation dependence >48h [employer], [facility] 2000, major non-cardiac surgery) Comment Score  Type of surgery - abd ao aneurysm (27), thoracic (21), neurosurgery / upper abdominal / vascular (21), neck (11) Umbilical/inguinal hernia repair 10  Emergency Surgery - (11)  0  ALbumin < 3 or poor nutritional state - (9)  0  BUN >  30 -  (8)  0  Partial or completely dependent functional status - (7)  0  COPD -  (6)  6  Age - 60 to 24 (4), > 70  (6)  6  TOTAL    Risk Stratifcation scores  - < 10 (0.5%),  11-19 (1.8%), 20-27 (4.2%), 28-40 (10.1%), >40 (26.6%)  22      1B) GUPTA - Prolonged Mech Vent Risk Score source Risk  Guptal post op prolonged mech ventilation > 48h or reintubation < 30 days - ACS 2007-2008 dataset - http://lewis-perez.info/ 0.6 % Risk of mechanical ventilation for >48 hrs after surgery, or unplanned intubation ?30 days of surgery    2) RISK FOR POST OP PNEUMONIA Score source Risk  Lyndel Safe - Post Op Pnemounia risk  [employer]co.za 1.0 % Risk of postoperative pneumonia    R3) ISK FOR ANY POST-OP PULMONARY COMPLICATION Score source Risk  CANET/ARISCAT Score - risk for ANY/ALl pulmonary complications - > risk of in-hospital post-op pulmonary complications (composite including respiratory failure, respiratory infection, pleural effusion, atelectasis, pneumothorax, bronchospasm, aspiration pneumonitis) [employer] - based on age, anemia, pulse ox, resp infection prior 30d, incision site, duration of surgery, and emergency v elective surgery Intermediate risk 13.3% risk of in-hospital post-op pulmonary complications      Follow Up Instructions:  As needed   I discussed the assessment and treatment plan with the patient. The patient was provided an opportunity to ask questions and all were answered. The patient agreed with the plan and demonstrated an understanding of the instructions.   The patient was advised to call back or seek an in-person evaluation if the symptoms worsen or if the condition fails to improve as anticipated.  I provided 22 minutes of non-face-to-face time during this encounter.   Martyn Ehrich, NP

## 2020-06-17 ENCOUNTER — Other Ambulatory Visit: Payer: Self-pay | Admitting: Family Medicine

## 2020-06-17 NOTE — Telephone Encounter (Signed)
Attempted to call pt to get her scheduled for pulmonary clearance visit prior to her having hernia repair surgery but unable to reach. Left message for her to return call.  Routing this to front desk pool so they can also try to help get pt scheduled for an appt.  Pt can have appt with either MR or APP and with this being a clearance visit, should probably be in-office visit.

## 2020-06-17 NOTE — Telephone Encounter (Signed)
Patient was calling back due to having message about an appointment but wasn't sure if it was an old message.  Patient had a phone visit with Derl Barrow on 06/16/20 and was told form would be sent to the surgeon.  ta

## 2020-06-18 ENCOUNTER — Telehealth: Payer: Self-pay | Admitting: Primary Care

## 2020-06-18 NOTE — Telephone Encounter (Signed)
Spoke with the pt  She states returning call  I advised that Raquel Sarna had called her to make appt for surgery clearance, but she has been seen since then and nothing further needed

## 2020-07-08 ENCOUNTER — Emergency Department (HOSPITAL_COMMUNITY): Payer: Medicare Other

## 2020-07-08 ENCOUNTER — Other Ambulatory Visit: Payer: Self-pay

## 2020-07-08 ENCOUNTER — Emergency Department (HOSPITAL_COMMUNITY)
Admission: EM | Admit: 2020-07-08 | Discharge: 2020-07-08 | Disposition: A | Discharge status: Home / Self Care | Payer: Medicare Other | Attending: Emergency Medicine | Admitting: Emergency Medicine

## 2020-07-08 ENCOUNTER — Encounter (HOSPITAL_COMMUNITY): Payer: Self-pay

## 2020-07-08 DIAGNOSIS — S0101XA Laceration without foreign body of scalp, initial encounter: Secondary | ICD-10-CM | POA: Insufficient documentation

## 2020-07-08 DIAGNOSIS — Z79899 Other long term (current) drug therapy: Secondary | ICD-10-CM | POA: Diagnosis not present

## 2020-07-08 DIAGNOSIS — J449 Chronic obstructive pulmonary disease, unspecified: Secondary | ICD-10-CM | POA: Diagnosis not present

## 2020-07-08 DIAGNOSIS — Z23 Encounter for immunization: Secondary | ICD-10-CM | POA: Insufficient documentation

## 2020-07-08 DIAGNOSIS — I10 Essential (primary) hypertension: Secondary | ICD-10-CM | POA: Insufficient documentation

## 2020-07-08 DIAGNOSIS — Z87891 Personal history of nicotine dependence: Secondary | ICD-10-CM | POA: Insufficient documentation

## 2020-07-08 DIAGNOSIS — W19XXXA Unspecified fall, initial encounter: Secondary | ICD-10-CM | POA: Insufficient documentation

## 2020-07-08 DIAGNOSIS — S0191XA Laceration without foreign body of unspecified part of head, initial encounter: Secondary | ICD-10-CM | POA: Diagnosis not present

## 2020-07-08 DIAGNOSIS — S0990XA Unspecified injury of head, initial encounter: Secondary | ICD-10-CM | POA: Diagnosis not present

## 2020-07-08 MED ORDER — ACETAMINOPHEN 325 MG PO TABS
650.0000 mg | ORAL_TABLET | Freq: Once | ORAL | Status: AC
  Administered 2020-07-08: 650 mg via ORAL
  Filled 2020-07-08: qty 2

## 2020-07-08 MED ORDER — LIDOCAINE-EPINEPHRINE (PF) 2 %-1:200000 IJ SOLN
10.0000 mL | Freq: Once | INTRAMUSCULAR | Status: AC
  Administered 2020-07-08: 10 mL
  Filled 2020-07-08: qty 20

## 2020-07-08 MED ORDER — TETANUS-DIPHTH-ACELL PERTUSSIS 5-2.5-18.5 LF-MCG/0.5 IM SUSY
0.5000 mL | PREFILLED_SYRINGE | Freq: Once | INTRAMUSCULAR | Status: AC
  Administered 2020-07-08: 0.5 mL via INTRAMUSCULAR
  Filled 2020-07-08: qty 0.5

## 2020-07-08 NOTE — ED Provider Notes (Signed)
[facility] DEPT Provider Note   CSN: [id number] Arrival date & time: 07/08/20  1049     History Chief Complaint  Patient presents with  . Fall  . Head Injury  . Hypertension    [name] is a 79 y.o. female.  HPI 79 year old female presents with fall and head injury.  Had taken a swim lesson and was walking back and she lost her balance and fell.  She did not get dizzy or pass out.  Hit her head on the wall and had bleeding from her scalp.  Now has a residual headache.  She is not on blood thinners.  No other injuries besides a small abrasion to her right elbow. No neck pain.  Past Medical History:  Diagnosis Date  . Allergic rhinitis   . Anemia   . Arthritis    all over  . Bilateral cataracts   . 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 No recurrence - October 2015 - clear CT,No recurrence             -Oct 2016 >no recurrence   . Chicken pox   . Cholelithiasis 05/21/2014   On CT scan 2015   . COPD (chronic obstructive pulmonary disease) ([place])   . Depression    hx ocd and anxiety in record as well  . Gout    last time 6-30yrs ago   . History of blood transfusion   . Hot flashes    takes Prozac daily  . Hx of migraines 90's   after menopause HA stopped  . Hyperlipidemia    taking Flax Seed Oil and Fish Oil  . Hypertension    takes Amlodipine nightly  . IBS (irritable bowel syndrome)   . Liver cyst   . Lung cancer ([place])   . Macrocytosis   . Migraine    MIGRAINES RESOLVED WITH MENOPAUSE  . Osteopenia    takes Calcium and Vit D bid  . Pneumonia    x 2 ;last time back in 1999  . PONV (postoperative nausea and vomiting)   . 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 Seasonal allergies   . Smoking 09/12/2011     Patient Active Problem List   Diagnosis Date Noted  . 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 Posterior vitreous detachment of left eye 02/25/2020  . Posterior vitreous detachment of right eye 02/25/2020  . Gallstones 02/12/2019  . Coronary artery calcification seen on CT scan 08/18/2018  . Hyperlipidemia 08/18/2018  . History of lung cancer 06/14/2018  . Osteopenia 06/14/2018  . Atherosclerosis of aorta ([place]) 05/24/2016  . Moderate COPD (chronic obstructive pulmonary disease), Sees Dr. Chase Caller, [facility] 05/21/2015  . Hypertension 06/22/2012    Past Surgical History:  Procedure Laterality Date  . CARPAL TUNNEL RELEASE  80's   right   . COLONOSCOPY    . cyst removed  >42yrs ago   from left leg  . DILATION AND CURETTAGE OF UTERUS     at age 55  . EXCISIONAL TOTAL KNEE ARTHROPLASTY Right 06/03/2014   Procedure: RIGHT KNEE SCAR EXCISION;  Surgeon: [name], MD;  Location: [facility];  Service: Orthopedics;  Laterality: Right;  . INGUINAL HERNIA REPAIR  2000  .  ROTATOR CUFF REPAIR  2001   right  . TONSILLECTOMY  as a child   and adenoids  . TOTAL ABDOMINAL HYSTERECTOMY  2003   benign tumor-[place]  . TOTAL HIP ARTHROPLASTY  2011   left  . TOTAL HIP ARTHROPLASTY  03/07/2012   Procedure: TOTAL HIP ARTHROPLASTY ANTERIOR APPROACH;  Surgeon: [name], MD;  Location: [facility];  Service: Orthopedics;  Laterality: Right;  . TOTAL KNEE ARTHROPLASTY Right 01/02/2013   Procedure: RIGHT TOTAL KNEE ARTHROPLASTY;  Surgeon: [name], MD;  Location: [facility];  Service: Orthopedics;  Laterality: Right;  . TOTAL KNEE ARTHROPLASTY Left 04/03/2013   Procedure: LEFT TOTAL KNEE ARTHROPLASTY;  Surgeon: [name], MD;  Location: [facility];  Service: Orthopedics;  Laterality: Left;  Marland Kitchen VIDEO BRONCHOSCOPY  10/20/2011   Procedure: VIDEO BRONCHOSCOPY;  Surgeon: Grace Isaac, MD;  Location: [facility];  Service: Thoracic;  Laterality: N/A;  . WEDGE RESECTION   10-20-2011   rt lung  - for lung cancer     OB History   No obstetric history on file.     Family History  Problem Relation Age of Onset  . Other Mother        polyarteritis  . Bowel Disease Father 11       colectomy/bowel obstruction issues  . Heart attack Father   . Hypertension Brother   . Arthritis Brother   . Post-traumatic stress disorder Brother   . Alcohol abuse Other   . Depression Other   . Hearing loss Other   . Heart disease Other   . Hypertension Other   . Anesthesia problems Neg Hx   . Hypotension Neg Hx   . Malignant hyperthermia Neg Hx   . Pseudochol deficiency Neg Hx   . Breast cancer Neg Hx     Social History   Tobacco Use  . Smoking status: Former Smoker    Packs/day: 0.50    Years: 50.00    Pack years: 25.00    Types: Cigarettes    Quit date: 09/23/2011    Years since quitting: 8.7  . Smokeless tobacco: Never Used  Vaping Use  . Vaping Use: Never used  Substance Use Topics  . Alcohol use: Yes    Comment: 2 beers every night  . Drug use: No    Home Medications Prior to Admission medications   Medication Sig Start Date End Date Taking? Authorizing Provider  Acetaminophen (TYLENOL PO) Take by mouth as needed. Alternates with Ibuprofen    [provider]  amLODipine (NORVASC) 5 MG tablet TAKE 1 TABLET BY MOUTH AT BEDTIME 08/31/19   Koberlein, Junell C, MD  Budeson-Glycopyrrol-Formoterol (BREZTRI AEROSPHERE) 160-9-4.8 MCG/ACT AERO Inhale 2 puffs into the lungs 2 (two) times daily. 04/25/20   Brand Males, MD  Cholecalciferol (VITAMIN D-3 PO) Take 1,200 mg by mouth every morning.    [provider]  Cyanocobalamin (VITAMIN B-12 PO) Take by mouth.    [provider]  FLUoxetine (PROZAC) 20 MG capsule Take 1 capsule by mouth twice daily 06/17/20   Koberlein, Steele Berg, MD  fluticasone (FLONASE) 50 MCG/ACT nasal spray Place 2 sprays into both nostrils daily. Patient not taking: Reported on 06/16/2020 11/22/14   Brand Males, MD  Glucosamine 500 MG CAPS Take 1,200 mg by mouth 2 (two) times daily.    [provider]  OVER THE COUNTER MEDICATION Memory supplement-once a day    [provider]  PROAIR HFA 108 [phone] Base) MCG/ACT inhaler  INHALE 2 PUFFS BY MOUTH EVERY 6 HOURS AS NEEDED FOR WHEEZING OR SHORTNESS OF BREATH 04/15/20   Brand Males, MD  rosuvastatin (CRESTOR) 5 MG tablet Take 1 tablet by mouth once daily 06/17/20   Koberlein, Steele Berg, MD  sodium chloride (OCEAN) 0.65 % SOLN nasal spray Place 1 spray into both nostrils daily as needed for congestion.  Patient not taking: Reported on 06/16/2020    [provider]  vitamin E 400 UNIT capsule Take 400 Units by mouth daily.    [provider]    Allergies    Clindamycin/lincomycin, Penicillins, Spiriva [tiotropium bromide monohydrate], and Sulfa antibiotics  Review of Systems   Review of Systems  Musculoskeletal: Negative for neck pain.  Neurological: Positive for headaches. Negative for dizziness, syncope, weakness and numbness.  All other systems reviewed and are negative.   Physical Exam Updated Vital Signs BP (!) 158/94 (BP Location: Right Arm)   Pulse 81   Temp 97.8 F (36.6 C) (Oral)   Resp 16   Ht 5\' 2"  (1.575 m)   Wt 53.5 kg   SpO2 92%   BMI 21.58 kg/m   Physical Exam Vitals and nursing note reviewed.  Constitutional:      Appearance: She is well-developed.  HENT:     Head: Normocephalic. Laceration present.      Right Ear: External ear normal.     Left Ear: External ear normal.     Nose: Nose normal.  Eyes:     General:        Right eye: No discharge.        Left eye: No discharge.     Extraocular Movements: Extraocular movements intact.     Pupils: Pupils are equal, round, and reactive to light.  Cardiovascular:     Rate and Rhythm: Normal rate and regular rhythm.     Heart sounds: Normal heart sounds.  Pulmonary:     Effort: Pulmonary effort is normal.     Breath sounds: Normal  breath sounds.  Abdominal:     Palpations: Abdomen is soft.     Tenderness: There is no abdominal tenderness.  Musculoskeletal:       Arms:     Cervical back: No tenderness.  Skin:    General: Skin is warm and dry.  Neurological:     Mental Status: She is alert.     Comments: CN 3-12 grossly intact. 5/5 strength in all 4 extremities. Grossly normal sensation.   Psychiatric:        Mood and Affect: Mood is not anxious.     ED Results / Procedures / Treatments   Labs (all labs ordered are listed, but only abnormal results are displayed) Labs Reviewed - No data to display  EKG None  Radiology CT Head Wo Contrast  Result Date: 07/08/2020 CLINICAL DATA:  Laceration.  Blunt trauma EXAM: CT HEAD WITHOUT CONTRAST TECHNIQUE: Contiguous axial images were obtained from the base of the skull through the vertex without intravenous contrast. COMPARISON:  Head CT 06/19/2019 FINDINGS: Brain: No acute intracranial hemorrhage. No focal mass lesion. No CT evidence of acute infarction. No midline shift or mass effect. No hydrocephalus. Basilar cisterns are patent. There are periventricular and subcortical white matter hypodensities. Generalized cortical atrophy. Vascular: No hyperdense vessel or unexpected calcification. Skull: Normal. Negative for fracture or focal lesion. Sinuses/Orbits: Paranasal sinuses and mastoid air cells are clear. Orbits are clear. Other: None. IMPRESSION: 1. No acute intracranial findings.  No evidence trauma. 2. Atrophy and  white matter microvascular disease. Electronically Signed   By: Suzy Bouchard M.D.   On: 07/08/2020 12:06    Procedures .Marland KitchenLaceration Repair  Date/Time: 07/08/2020 12:28 PM Performed by: Sherwood Gambler, MD Authorized by: Sherwood Gambler, MD   Consent:    Consent obtained:  Verbal   Consent given by:  Patient Anesthesia (see MAR for exact dosages):    Anesthesia method:  Local infiltration   Local anesthetic:  Lidocaine 2% WITH  epi Laceration details:    Location:  Scalp   Scalp location:  L parietal   Length (cm):  3 Repair type:    Repair type:  Simple Pre-procedure details:    Preparation:  Patient was prepped and draped in usual sterile fashion and imaging obtained to evaluate for foreign bodies Exploration:    Wound exploration: entire depth of wound probed and visualized     Contaminated: no   Treatment:    Area cleansed with:  Saline   Amount of cleaning:  Standard   Irrigation solution:  Sterile saline   Irrigation method:  Syringe Skin repair:    Repair method:  Staples   Number of staples:  5 Approximation:    Approximation:  Close Post-procedure details:    Dressing:  Open (no dressing)   Patient tolerance of procedure:  Tolerated well, no immediate complications   (including critical care time)  Medications Ordered in ED Medications  acetaminophen (TYLENOL) tablet 650 mg (650 mg Oral Given 07/08/20 1130)  Tdap (BOOSTRIX) injection 0.5 mL (0.5 mLs Intramuscular Given 07/08/20 1130)  lidocaine-EPINEPHrine (XYLOCAINE W/EPI) 2 %-1:200000 (PF) injection 10 mL (10 mLs Infiltration Given by Other 07/08/20 1130)    ED Course  I have reviewed the triage vital signs and the nursing notes.  Pertinent labs & imaging results that were available during my care of the patient were reviewed by me and considered in my medical decision making (see chart for details).    MDM Rules/Calculators/A&P                          Patient is feeling fine besides localized headache.  CT head has been personally reviewed and shows no obvious skull fracture or bleeding.  Tetanus was updated.  I have repaired her scalp laceration as above.  Otherwise, was initially quite hypertensive but this has improved in the ED. Final Clinical Impression(s) / ED Diagnoses Final diagnoses:  Laceration of scalp, initial encounter    Rx / DC Orders ED Discharge Orders    None       Sherwood Gambler, MD 07/08/20 1538

## 2020-07-08 NOTE — ED Notes (Signed)
An After Visit Summary was printed and given to the patient. Discharge instructions given and no further questions at this time.  

## 2020-07-08 NOTE — ED Triage Notes (Addendum)
Patient states she had swim class today and slipped hitting her head on the wall. Bleeding stopped, but has a laceration. Patient denies any blood thinners. Patient denies having LOC.  BP-226/121 in triage. Patient states she normally takes Norvasc daily, but has not taken today.

## 2020-07-08 NOTE — ED Notes (Signed)
An After Visit Summary was printed and given to the patient. Discharge instructions given and no further questions at this time.  Pt leaving with husband. 

## 2020-07-08 NOTE — ED Notes (Signed)
Patient transported to CT 

## 2020-07-08 NOTE — Discharge Instructions (Signed)
If you develop continued, recurrent, or worsening headache, fever, neck stiffness, vomiting, blurry or double vision, weakness or numbness in your arms or legs, trouble speaking, or any other new/concerning symptoms then return to the ER for evaluation.  

## 2020-07-09 NOTE — Progress Notes (Signed)
Responded to an emergency in the pool area.  Found pt up sitting upright on floor. Noted blood on color of jacket Reports slip and fall. Walks with quad cane.  No LOC, A&Ox4 able to tell me what happened.  Sts falls frequently. Noted bleeding to back of head. sts hit head on wall.  No denies neck/back, wrist, hip pains. Has not had BP meds before class.  Not on blood thinners.  Talked about using a walker instead of cane since frequent falls. Does have area rugs at home for dogs that she is careful around.  BP difficult 130/80 (cuff likely a bit large for size of patient) Does have H/A.  EMS arrived. Examined/assessed. Cleared neck, moved to wheelchair. Pt wanted to go to Urgent Care and not ED due to concerns of wait time.  Pt sts husband can drive her.  Assisted in wheel chair to car with husband.   Chart review done this am. Noted pt seen in ED.

## 2020-07-14 NOTE — Progress Notes (Signed)
DUE TO COVID-19 ONLY ONE VISITOR IS ALLOWED TO COME WITH YOU AND STAY IN THE WAITING ROOM ONLY DURING PRE OP AND PROCEDURE DAY OF SURGERY. THE 1 VISITOR  MAY VISIT WITH YOU AFTER SURGERY IN YOUR PRIVATE ROOM DURING VISITING HOURS ONLY!  YOU NEED TO HAVE A COVID 19 TEST ON_______ @_______ , THIS TEST MUST BE DONE BEFORE SURGERY,  COVID TESTING SITE [place] [facility] [place] [id number], IT IS ON THE RIGHT GOING OUT [address] APPROXIMATELY  2 MINUTES PAST [employer] ON THE RIGHT. ONCE YOUR COVID TEST IS COMPLETED,  PLEASE BEGIN THE QUARANTINE INSTRUCTIONS AS OUTLINED IN YOUR HANDOUT.                [name]  07/14/2020   Your procedure is scheduled on: 07/18/2020    Report to [facility] Main  Entrance   Report to admitting at    0700 AM     Call this number if you have problems the morning of surgery [phone]    REMEMBER: NO  SOLID FOOD CANDY OR GUM AFTER MIDNIGHT. CLEAR LIQUIDS UNTIL  0600am        . NOTHING BY MOUTH EXCEPT CLEAR LIQUIDS UNTIL    . PLEASE FINISH ENSURE DRINK PER SURGEON ORDER  WHICH NEEDS TO BE COMPLETED AT    0600am   .      CLEAR LIQUID DIET   Foods Allowed                                                                    Coffee and tea, regular and decaf                            Fruit ices (not with fruit pulp)                                      Iced Popsicles                                    Carbonated beverages, regular and diet                                    Cranberry, grape and apple juices Sports drinks like Gatorade Lightly seasoned clear broth or consume(fat free) Sugar, honey syrup ___________________________________________________________________      BRUSH YOUR TEETH MORNING OF SURGERY AND RINSE YOUR MOUTH OUT, NO CHEWING GUM CANDY OR MINTS.     Take these medicines the morning of surgery with A SIP OF WATER: amlodipine, Inhalers as usual and bring, Prozac   DO NOT TAKE ANY DIABETIC  MEDICATIONS DAY OF YOUR SURGERY                               You may not have any metal on your body including hair pins and  piercings  Do not wear jewelry, make-up, lotions, powders or perfumes, deodorant             Do not wear nail polish on your fingernails.  Do not shave  48 hours prior to surgery.              Men may shave face and neck.   Do not bring valuables to the hospital. [place].  Contacts, dentures or bridgework may not be worn into surgery.  Leave suitcase in the car. After surgery it may be brought to your room.     Patients discharged the day of surgery will not be allowed to drive home. IF YOU ARE HAVING SURGERY AND GOING HOME THE SAME DAY, YOU MUST HAVE AN ADULT TO DRIVE YOU HOME AND BE WITH YOU FOR 24 HOURS. YOU MAY GO HOME BY TAXI OR UBER OR ORTHERWISE, BUT AN ADULT MUST ACCOMPANY YOU HOME AND STAY WITH YOU FOR 24 HOURS.  Name and phone number of your driver:  Special Instructions: N/A              Please read over the following fact sheets you were given: _____________________________________________________________________  [facility] - Preparing for Surgery Before surgery, you can play an important role.  Because skin is not sterile, your skin needs to be as free of germs as possible.  You can reduce the number of germs on your skin by washing with CHG (chlorahexidine gluconate) soap before surgery.  CHG is an antiseptic cleaner which kills germs and bonds with the skin to continue killing germs even after washing. Please DO NOT use if you have an allergy to CHG or antibacterial soaps.  If your skin becomes reddened/irritated stop using the CHG and inform your nurse when you arrive at Short Stay. Do not shave (including legs and underarms) for at least 48 hours prior to the first CHG shower.  You may shave your face/neck. Please follow these instructions carefully:  1.  Shower with CHG Soap the night  before surgery and the  morning of Surgery.  2.  If you choose to wash your hair, wash your hair first as usual with your  normal  shampoo.  3.  After you shampoo, rinse your hair and body thoroughly to remove the  shampoo.                           4.  Use CHG as you would any other liquid soap.  You can apply chg directly  to the skin and wash                       Gently with a scrungie or clean washcloth.  5.  Apply the CHG Soap to your body ONLY FROM THE NECK DOWN.   Do not use on face/ open                           Wound or open sores. Avoid contact with eyes, ears mouth and genitals (private parts).                       Wash face,  Genitals (private parts) with your normal soap.             6.  Wash thoroughly, paying special attention to the area where your surgery  will be performed.  7.  Thoroughly rinse your body with warm water from the neck down.  8.  DO NOT shower/wash with your normal soap after using and rinsing off  the CHG Soap.                9.  Pat yourself dry with a clean towel.            10.  Wear clean pajamas.            11.  Place clean sheets on your bed the night of your first shower and do not  sleep with pets. Day of Surgery : Do not apply any lotions/deodorants the morning of surgery.  Please wear clean clothes to the hospital/surgery center.  FAILURE TO FOLLOW THESE INSTRUCTIONS MAY RESULT IN THE CANCELLATION OF YOUR SURGERY PATIENT SIGNATURE_________________________________  NURSE SIGNATURE__________________________________  ________________________________________________________________________

## 2020-07-15 ENCOUNTER — Ambulatory Visit: Admission: RE | Admit: 2020-07-15 | Payer: Medicare Other | Source: Ambulatory Visit

## 2020-07-15 ENCOUNTER — Other Ambulatory Visit (HOSPITAL_COMMUNITY)
Admission: RE | Admit: 2020-07-15 | Discharge: 2020-07-15 | Disposition: A | Discharge status: Home / Self Care | Payer: Medicare Other | Source: Ambulatory Visit | Attending: Surgery | Admitting: Surgery

## 2020-07-15 DIAGNOSIS — Z01812 Encounter for preprocedural laboratory examination: Secondary | ICD-10-CM | POA: Diagnosis not present

## 2020-07-15 DIAGNOSIS — Z20822 Contact with and (suspected) exposure to covid-19: Secondary | ICD-10-CM | POA: Insufficient documentation

## 2020-07-15 LAB — SARS CORONAVIRUS 2 (TAT 6-24 HRS): SARS Coronavirus 2: NEGATIVE

## 2020-07-16 ENCOUNTER — Encounter (HOSPITAL_COMMUNITY): Payer: Self-pay

## 2020-07-16 ENCOUNTER — Other Ambulatory Visit: Payer: Self-pay

## 2020-07-16 ENCOUNTER — Encounter (HOSPITAL_COMMUNITY)
Admission: RE | Admit: 2020-07-16 | Discharge: 2020-07-16 | Disposition: A | Discharge status: Home / Self Care | Payer: Medicare Other | Source: Ambulatory Visit | Attending: Surgery | Admitting: Surgery

## 2020-07-16 ENCOUNTER — Encounter: Payer: Self-pay | Admitting: Family Medicine

## 2020-07-16 ENCOUNTER — Ambulatory Visit (INDEPENDENT_AMBULATORY_CARE_PROVIDER_SITE_OTHER): Payer: Medicare Other | Admitting: Family Medicine

## 2020-07-16 VITALS — BP 146/90 | HR 80 | Temp 98.9°F | Resp 16 | Ht 62.0 in | Wt 122.8 lb

## 2020-07-16 DIAGNOSIS — K439 Ventral hernia without obstruction or gangrene: Secondary | ICD-10-CM | POA: Insufficient documentation

## 2020-07-16 DIAGNOSIS — Z9079 Acquired absence of other genital organ(s): Secondary | ICD-10-CM | POA: Insufficient documentation

## 2020-07-16 DIAGNOSIS — Z87891 Personal history of nicotine dependence: Secondary | ICD-10-CM | POA: Diagnosis not present

## 2020-07-16 DIAGNOSIS — I10 Essential (primary) hypertension: Secondary | ICD-10-CM

## 2020-07-16 DIAGNOSIS — S0101XD Laceration without foreign body of scalp, subsequent encounter: Secondary | ICD-10-CM

## 2020-07-16 DIAGNOSIS — Z96653 Presence of artificial knee joint, bilateral: Secondary | ICD-10-CM | POA: Diagnosis not present

## 2020-07-16 DIAGNOSIS — J449 Chronic obstructive pulmonary disease, unspecified: Secondary | ICD-10-CM | POA: Insufficient documentation

## 2020-07-16 DIAGNOSIS — Z01812 Encounter for preprocedural laboratory examination: Secondary | ICD-10-CM | POA: Insufficient documentation

## 2020-07-16 DIAGNOSIS — Z96642 Presence of left artificial hip joint: Secondary | ICD-10-CM | POA: Insufficient documentation

## 2020-07-16 DIAGNOSIS — Z79899 Other long term (current) drug therapy: Secondary | ICD-10-CM | POA: Diagnosis not present

## 2020-07-16 LAB — BASIC METABOLIC PANEL
Anion gap: 8 (ref 5–15)
BUN: 9 mg/dL (ref 8–23)
CO2: 26 mmol/L (ref 22–32)
Calcium: 9.6 mg/dL (ref 8.9–10.3)
Chloride: 100 mmol/L (ref 98–111)
Creatinine, Ser: 0.93 mg/dL (ref 0.44–1.00)
GFR, Estimated: >60 mL/min (ref >60)
Glucose, Bld: 85 mg/dL (ref 70–99)
Potassium: 4 mmol/L (ref 3.5–5.1)
Sodium: 134 mmol/L — ABNORMAL LOW (ref 135–145)

## 2020-07-16 LAB — CBC
HCT: 43.8 % (ref 36.0–46.0)
Hemoglobin: 15 g/dL (ref 12.0–15.0)
MCH: 33.1 pg (ref 26.0–34.0)
MCHC: 34.2 g/dL (ref 30.0–36.0)
MCV: 96.7 fL (ref 80.0–100.0)
Platelets: 200 10*3/uL (ref 150–400)
RBC: 4.53 MIL/uL (ref 3.87–5.11)
RDW: 12 % (ref 11.5–15.5)
WBC: 6 10*3/uL (ref 4.0–10.5)
nRBC: 0 % (ref 0.0–0.2)

## 2020-07-16 NOTE — Progress Notes (Signed)
Anesthesia Review:  NOT:[facility] at [facility]  DR Betty Martinique LOV 07/16/2020  Cardiologist : 06/16/2020- [place] - [facility] Charlesetta Garibaldi - preop op visit  Chest x-ray : 06/24/2020- Ct of chest  EKG :9/3/20201  Echo : Stress test:06/26/2019  Cardiac Cath :  Activity level: can do a flight of steps slowly without difficulty  Sleep Study/ CPAP : no  Fasting Blood Sugar :      / Checks Blood Sugar -- times a day:   Blood Thinner/ Instructions /Last Dose: ASA / Instructions/ Last Dose :  07/08/20- fall at [facility]- in epic Laceratoin to scalp- Pt had staples out on 07/15/2020 per pt.  Pt reports having 3 falls in last few months.  Walks now with a cane.

## 2020-07-16 NOTE — Patient Instructions (Signed)
A few things to remember from today's visit:   Primary hypertension  Laceration of scalp, subsequent encounter  If you need refills please call your pharmacy. Do not use My Chart to request refills or for acute issues that need immediate attention.   Monitor blood pressure at home. Keep wound clean with soap and water. Please be sure medication list is accurate. If a new problem present, please set up appointment sooner than planned today.

## 2020-07-16 NOTE — Progress Notes (Signed)
HPI: [name] is a 79 y.o. female, who is here today to follow on recent ER.   She presented to the ER on 07/08/20 after a fall. She fell at the [facility] after finishing a swimming class. Occipital scalp laceration, staples were placed x 5.  Negative for headache,visual change,or focal weakness. Negative for fever,chills,or fatigue.  Head CT: 1. No acute intracranial findings.  No evidence trauma. 2. Atrophy and white matter microvascular disease.  BP is mildly elevated today. She is on Amlodipine 5 mg daily Negative for CP,SOB,palpitations, or edema.  Lab Results  Component Value Date   CREATININE 0.93 07/16/2020   BUN 9 07/16/2020   NA 134 (L) 07/16/2020   K 4.0 07/16/2020   CL 100 07/16/2020   CO2 26 07/16/2020    Review of Systems  HENT: Negative for mouth sores, nosebleeds and sore throat.   Respiratory: Negative for cough and wheezing.   Gastrointestinal: Negative for abdominal pain, nausea and vomiting.       Negative for changes in bowel habits.  Genitourinary: Negative for decreased urine volume and hematuria.  Neurological: Negative for syncope and facial asymmetry.  Rest see pertinent positives and negatives per HPI.   Current Outpatient Medications on File Prior to Visit  Medication Sig Dispense Refill  . acetaminophen (TYLENOL) 325 MG tablet Take 325 mg by mouth every 6 (six) hours as needed for moderate pain. Alternates with Ibuprofen    . amLODipine (NORVASC) 5 MG tablet TAKE 1 TABLET BY MOUTH AT BEDTIME (Patient taking differently: Take 5 mg by mouth daily. ) 90 tablet 0  . Budeson-Glycopyrrol-Formoterol (BREZTRI AEROSPHERE) 160-9-4.8 MCG/ACT AERO Inhale 2 puffs into the lungs 2 (two) times daily. 10.7 g 3  . cholecalciferol (VITAMIN D3) 25 MCG (1000 UNIT) tablet Take 1,000 Units by mouth daily.    Marland Kitchen FLUoxetine (PROZAC) 20 MG capsule Take 1 capsule by mouth twice daily (Patient taking differently: Take 20 mg by mouth 2 (two) times daily.  ) 180 capsule 0  . PROAIR HFA 108 (90 Base) MCG/ACT inhaler INHALE 2 PUFFS BY MOUTH EVERY 6 HOURS AS NEEDED FOR WHEEZING OR SHORTNESS OF BREATH (Patient taking differently: Inhale 2 puffs into the lungs every 6 (six) hours as needed for wheezing or shortness of breath. INHALE 2 PUFFS BY MOUTH EVERY 6 HOURS AS NEEDED FOR WHEEZING OR SHORTNESS OF BREATH) 18 g 3  . rosuvastatin (CRESTOR) 5 MG tablet Take 1 tablet by mouth once daily (Patient taking differently: Take 5 mg by mouth daily. ) 90 tablet 0  . vitamin B-12 (CYANOCOBALAMIN) 1000 MCG tablet Take 1,000 mcg by mouth daily.     . vitamin E 400 UNIT capsule Take 400 Units by mouth daily.     No current facility-administered medications on file prior to visit.     Past Medical History:  Diagnosis Date  . Allergic rhinitis   . Anemia   . Anxiety   . Arthritis    all over  . Bilateral cataracts   . Chicken pox   . Cholelithiasis 05/21/2014   On CT scan 2015   . COPD (chronic obstructive pulmonary disease) ([place])   . Depression    hx ocd and anxiety in record as well  . Gout    last time 6-9yrs ago   . History of blood transfusion   . Hot flashes    takes Prozac daily  . Hx of migraines 90's   after menopause HA stopped  . Hyperlipidemia  taking Flax Seed Oil and Fish Oil  . Hypertension    takes Amlodipine nightly  . IBS (irritable bowel syndrome)   . Liver cyst   . Macrocytosis   . Osteopenia    takes Calcium and Vit D bid  . Pneumonia    x 2 ;last time back in 1999  . PONV (postoperative nausea and vomiting)   . 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 Seasonal allergies   . Smoking 09/12/2011   Allergies  Allergen Reactions  . Clindamycin/Lincomycin Nausea And Vomiting  . Penicillins Rash       . Spiriva [Tiotropium Bromide Monohydrate]     General intolerance  . Sulfa Antibiotics Hives and Rash    Severe Hives with skin reaction    Social History   Socioeconomic History   . Marital status: Married    Spouse name: Not on file  . Number of children: Not on file  . Years of education: Not on file  . Highest education level: Not on file  Occupational History  . Occupation: retired  Tobacco Use  . Smoking status: Former Smoker    Packs/day: 0.50    Years: 50.00    Pack years: 25.00    Types: Cigarettes    Quit date: 09/23/2011    Years since quitting: 8.8  . Smokeless tobacco: Never Used  Vaping Use  . Vaping Use: Never used  Substance and Sexual Activity  . Alcohol use: Yes    Comment: 2 beers every night  . Drug use: No  . Sexual activity: Never  Other Topics Concern  . Not on file  Social History Narrative  . Not on file   Social Determinants of Health   Financial Resource Strain: Low Risk   . Difficulty of Paying Living Expenses: Not hard at all  Food Insecurity: No Food Insecurity  . Worried About Charity fundraiser in the Last Year: Never true  . Ran Out of Food in the Last Year: Never true  Transportation Needs: No Transportation Needs  . Lack of Transportation (Medical): No  . Lack of Transportation (Non-Medical): No  Physical Activity: Insufficiently Active  . Days of Exercise per Week: 1 day  . Minutes of Exercise per Session: 30 min  Stress: No Stress Concern Present  . Feeling of Stress : Not at all  Social Connections: Moderately Isolated  . Frequency of Communication with Friends and Family: More than three times a week  . Frequency of Social Gatherings with Friends and Family: More than three times a week  . Attends Religious Services: Never  . Active Member of Clubs or Organizations: No  . Attends Archivist Meetings: Never  . Marital Status: Married    Vitals:   07/16/20 1109  BP: (!) 146/90  Pulse: 80  Resp: 16  Temp: 98.9 F (37.2 C)  SpO2: 90%   Body mass index is 22.46 kg/m.  Physical Exam Vitals and nursing note reviewed.  Constitutional:      General: She is not in acute distress.     Appearance: She is well-developed.  HENT:     Head: Normocephalic and atraumatic.   Eyes:     Conjunctiva/sclera: Conjunctivae normal.  Pulmonary:     Effort: Pulmonary effort is normal. No respiratory distress.  Skin:    General: Skin is warm.     Findings: Laceration (Occipital scalp) present. No rash.  Neurological:  Mental Status: She is alert and oriented to person, place, and time.     Comments: Unstable gait assisted with a cane.   ASSESSMENT AND PLAN:  [name] was seen today for suture / staple removal.  Diagnoses and all orders for this visit:  Laceration of scalp, subsequent encounter Wound is healing well. 5 staples removed. She tolerated procedure well. Keep area clean with soap and water. Monitor for signs of infection.  Primary hypertension Mildly elevated. Recommend monitoring BP regularly. Continue Amlodipine 5 mg daily.  Return if symptoms worsen or fail to improve.  [name] G. [place], MD  [facility]. [place] office.   A few things to remember from today's visit:   Primary hypertension  Laceration of scalp, subsequent encounter  If you need refills please call your pharmacy. Do not use My Chart to request refills or for acute issues that need immediate attention.   Monitor blood pressure at home. Keep wound clean with soap and water. Please be sure medication list is accurate. If a new problem present, please set up appointment sooner than planned today.

## 2020-07-17 MED ORDER — CLINDAMYCIN PHOSPHATE 900 MG/50ML IV SOLN
900.0000 mg | INTRAVENOUS | Status: AC
  Administered 2020-07-18: 900 mg via INTRAVENOUS
  Filled 2020-07-17: qty 50

## 2020-07-17 MED ORDER — BUPIVACAINE LIPOSOME 1.3 % IJ SUSP
20.0000 mL | Freq: Once | INTRAMUSCULAR | Status: DC
  Filled 2020-07-17: qty 20

## 2020-07-17 MED ORDER — GENTAMICIN SULFATE 40 MG/ML IJ SOLN
5.0000 mg/kg | INTRAVENOUS | Status: AC
  Administered 2020-07-18: 270 mg via INTRAVENOUS
  Filled 2020-07-17: qty 6.75

## 2020-07-17 NOTE — Anesthesia Preprocedure Evaluation (Addendum)
Anesthesia Evaluation  Patient identified by MRN, date of birth, ID band Patient awake    Reviewed: Allergy & Precautions, NPO status , Patient's Chart, lab work & pertinent test results  History of Anesthesia Complications (+) PONV  Airway Mallampati: II  TM Distance: >3 FB Neck ROM: Full    Dental no notable dental hx. (+) Teeth Intact, Dental Advisory Given   Pulmonary COPD, former smoker,    Pulmonary exam normal breath sounds clear to auscultation       Cardiovascular hypertension, Pt. on medications Normal cardiovascular exam Rhythm:Regular Rate:Normal  08/2018 Myoview  The left ventricular ejection fraction is hyperdynamic (>65%).  Nuclear stress EF: 68%.  There was no ST segment deviation noted during stress.  The study is normal.  This is a low risk study. No ischemia or previous infarction      Neuro/Psych PSYCHIATRIC DISORDERS Anxiety Depression    GI/Hepatic Neg liver ROS,   Endo/Other  negative endocrine ROSK+ 4.0  Renal/GU negative Renal ROS     Musculoskeletal  (+) Arthritis ,   Abdominal   Peds  Hematology Hgb 15.0   Anesthesia Other Findings   Reproductive/Obstetrics                           Anesthesia Physical Anesthesia Plan  ASA: III  Anesthesia Plan: General   Post-op Pain Management:    Induction: Intravenous  PONV Risk Score and Plan: 4 or greater and Treatment may vary due to age or medical condition, Ondansetron, Dexamethasone and Scopolamine patch - Pre-op  Airway Management Planned: Oral ETT  Additional Equipment: None  Intra-op Plan:   Post-operative Plan:   Informed Consent: I have reviewed the patients History and Physical, chart, labs and discussed the procedure including the risks, benefits and alternatives for the proposed anesthesia with the patient or authorized representative who has indicated his/her understanding and acceptance.      Dental advisory given  Plan Discussed with:   Anesthesia Plan Comments: (See PAT note 07/16/2020, Konrad Felix, PA-C)       Anesthesia Quick Evaluation

## 2020-07-17 NOTE — Progress Notes (Signed)
Anesthesia Chart Review   Case: [id number] Date/Time: 07/18/20 0845   Procedures:      LAPAROSCOPIC VENTRAL HERNIA REPAIR, (N/A )     POSSIBLE HERNIA REPAIR INGUINAL ADULT REPAIR (N/A )   Anesthesia type: General   Pre-op diagnosis: ventral wall hernia   Location[facility] ROOM 05 / [facility]   Surgeons: Michael Boston, MD      DISCUSSION:79 y.o. former smoker with h/o PONV, COPD, ventral wall hernia scheduled for above procedure 07/18/2020 with Dr. Michael Boston.   Last seen by pulmonology 06/16/20. Per OV note, "Pre-op respiratory assessment:  - Patient is cleared by pulmonary for hernia repair, date TBD. She is an intermediate risk for post-op pulmonary complicagtions d/t age, COPD and type of surgery. She is independent, active and not on oxygen. She has had no recent respiratory infections.  - Encouraged early post-op ambulation, consistent/frequent use of incentive spirometer and compression stockings"  Anticipate pt can proceed with planned procedure barring acute status change.   VS: BP (!) 149/101   Pulse 66   Temp 36.8 C (Oral)   Resp 16   Ht 5\' 2"  (1.575 m)   Wt 54.9 kg   SpO2 94%   BMI 22.13 kg/m   PROVIDERS: Caren Macadam, MD is PCP   Geraldo Pitter, MD is Pulmonologist  LABS: Labs reviewed: Acceptable for surgery. (all labs ordered are listed, but only abnormal results are displayed)  Labs Reviewed  BASIC METABOLIC PANEL - Abnormal; Notable for the following components:      Result Value   Sodium 134 (*)    All other components within normal limits  CBC     IMAGES:   EKG: 04/11/20 Rate 72 bpm Sinus rhythm   CV: Myocardial Perfusion 08/25/2018  The left ventricular ejection fraction is hyperdynamic (>65%).  Nuclear stress EF: 68%.  There was no ST segment deviation noted during stress.  The study is normal.  This is a low risk study. No ischemia or previous infarction   Past Medical History:  Diagnosis Date  . Allergic rhinitis   . Anemia    . Anxiety   . Arthritis    all over  . Bilateral cataracts   . Chicken pox   . Cholelithiasis 05/21/2014   On CT scan 2015   . COPD (chronic obstructive pulmonary disease) ([place])   . Depression    hx ocd and anxiety in record as well  . Gout    last time 6-65yrs ago   . History of blood transfusion   . Hot flashes    takes Prozac daily  . Hx of migraines 90's   after menopause HA stopped  . Hyperlipidemia    taking Flax Seed Oil and Fish Oil  . Hypertension    takes Amlodipine nightly  . IBS (irritable bowel syndrome)   . Liver cyst   . Macrocytosis   . Osteopenia    takes Calcium and Vit D bid  . Pneumonia    x 2 ;last time back in 1999  . PONV (postoperative nausea and vomiting)   . 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 Seasonal allergies   . Smoking 09/12/2011    Past Surgical History:  Procedure Laterality Date  . CARPAL TUNNEL RELEASE  80's   right   . COLONOSCOPY    . cyst removed  >48yrs ago   from left leg  . DILATION AND CURETTAGE OF UTERUS  at age 77  . EXCISIONAL TOTAL KNEE ARTHROPLASTY Right 06/03/2014   Procedure: RIGHT KNEE SCAR EXCISION;  Surgeon: [name], MD;  Location: [facility];  Service: Orthopedics;  Laterality: Right;  . INGUINAL HERNIA REPAIR  2000  . ROTATOR CUFF REPAIR  2001   right  . TONSILLECTOMY  as a child   and adenoids  . TOTAL ABDOMINAL HYSTERECTOMY  2003   benign tumor-[facility]  . TOTAL HIP ARTHROPLASTY  2011   left  . TOTAL HIP ARTHROPLASTY  03/07/2012   Procedure: TOTAL HIP ARTHROPLASTY ANTERIOR APPROACH;  Surgeon: [name], MD;  [facility];  Service: Orthopedics;  Laterality: Right;  . TOTAL KNEE ARTHROPLASTY Right 01/02/2013   Procedure: RIGHT TOTAL KNEE ARTHROPLASTY;  Surgeon: [name], MD;  [facility]: [facility];  Service: Orthopedics;  Laterality: Right;  . TOTAL KNEE ARTHROPLASTY Left 04/03/2013   Procedure: LEFT TOTAL KNEE ARTHROPLASTY;  Surgeon: [name],  MD;  [facility];  Service: Orthopedics;  Laterality: Left;  Marland Kitchen VIDEO BRONCHOSCOPY  10/20/2011   Procedure: VIDEO BRONCHOSCOPY;  Surgeon: Grace Isaac, MD;  Location: [facility];  Service: Thoracic;  Laterality: N/A;  . WEDGE RESECTION  10-20-2011   rt lung  - for lung cancer    MEDICATIONS: . acetaminophen (TYLENOL) 325 MG tablet  . amLODipine (NORVASC) 5 MG tablet  . Budeson-Glycopyrrol-Formoterol (BREZTRI AEROSPHERE) 160-9-4.8 MCG/ACT AERO  . cholecalciferol (VITAMIN D3) 25 MCG (1000 UNIT) tablet  . FLUoxetine (PROZAC) 20 MG capsule  . PROAIR HFA 108 (90 Base) MCG/ACT inhaler  . rosuvastatin (CRESTOR) 5 MG tablet  . vitamin B-12 (CYANOCOBALAMIN) 1000 MCG tablet  . vitamin E 400 UNIT capsule   No current facility-administered medications for this encounter.   Derrill Memo ON 07/18/2020] bupivacaine liposome (EXPAREL) 1.3 % injection 266 mg  . [START ON 07/18/2020] clindamycin (CLEOCIN) IVPB 900 mg   And  . [START ON 07/18/2020] gentamicin (GARAMYCIN) 270 mg in dextrose 5 % 100 mL IVPB      Konrad Felix, PA-C [facility] [phone]

## 2020-07-18 ENCOUNTER — Ambulatory Visit (HOSPITAL_COMMUNITY): Payer: Medicare Other | Admitting: Certified Registered Nurse Anesthetist

## 2020-07-18 ENCOUNTER — Encounter (HOSPITAL_COMMUNITY)
Admission: AD | Disposition: A | Discharge status: Skilled Nursing Facility | Payer: Self-pay | Source: Home / Self Care | Attending: Surgery

## 2020-07-18 ENCOUNTER — Encounter (HOSPITAL_COMMUNITY): Payer: Self-pay | Admitting: Surgery

## 2020-07-18 ENCOUNTER — Inpatient Hospital Stay (HOSPITAL_COMMUNITY)
Admission: AD | Admit: 2020-07-18 | Discharge: 2020-07-24 | DRG: 981 | Disposition: A | Discharge status: Skilled Nursing Facility | Payer: Medicare Other | Attending: Family Medicine | Admitting: Family Medicine

## 2020-07-18 ENCOUNTER — Other Ambulatory Visit: Payer: Self-pay

## 2020-07-18 DIAGNOSIS — K439 Ventral hernia without obstruction or gangrene: Secondary | ICD-10-CM | POA: Diagnosis present

## 2020-07-18 DIAGNOSIS — Z20822 Contact with and (suspected) exposure to covid-19: Secondary | ICD-10-CM | POA: Diagnosis present

## 2020-07-18 DIAGNOSIS — J441 Chronic obstructive pulmonary disease with (acute) exacerbation: Secondary | ICD-10-CM | POA: Diagnosis not present

## 2020-07-18 DIAGNOSIS — R0902 Hypoxemia: Secondary | ICD-10-CM

## 2020-07-18 DIAGNOSIS — E8770 Fluid overload, unspecified: Secondary | ICD-10-CM | POA: Diagnosis present

## 2020-07-18 DIAGNOSIS — E785 Hyperlipidemia, unspecified: Secondary | ICD-10-CM | POA: Diagnosis present

## 2020-07-18 DIAGNOSIS — K436 Other and unspecified ventral hernia with obstruction, without gangrene: Secondary | ICD-10-CM | POA: Diagnosis not present

## 2020-07-18 DIAGNOSIS — Z8261 Family history of arthritis: Secondary | ICD-10-CM | POA: Diagnosis not present

## 2020-07-18 DIAGNOSIS — J9589 Other postprocedural complications and disorders of respiratory system, not elsewhere classified: Secondary | ICD-10-CM | POA: Diagnosis not present

## 2020-07-18 DIAGNOSIS — K4090 Unilateral inguinal hernia, without obstruction or gangrene, not specified as recurrent: Secondary | ICD-10-CM | POA: Diagnosis not present

## 2020-07-18 DIAGNOSIS — Z9981 Dependence on supplemental oxygen: Secondary | ICD-10-CM

## 2020-07-18 DIAGNOSIS — Z7401 Bed confinement status: Secondary | ICD-10-CM | POA: Diagnosis not present

## 2020-07-18 DIAGNOSIS — M6281 Muscle weakness (generalized): Secondary | ICD-10-CM | POA: Diagnosis not present

## 2020-07-18 DIAGNOSIS — Z881 Allergy status to other antibiotic agents status: Secondary | ICD-10-CM

## 2020-07-18 DIAGNOSIS — N1832 Chronic kidney disease, stage 3b: Secondary | ICD-10-CM | POA: Diagnosis present

## 2020-07-18 DIAGNOSIS — Y838 Other surgical procedures as the cause of abnormal reaction of the patient, or of later complication, without mention of misadventure at the time of the procedure: Secondary | ICD-10-CM | POA: Diagnosis present

## 2020-07-18 DIAGNOSIS — J9611 Chronic respiratory failure with hypoxia: Secondary | ICD-10-CM | POA: Diagnosis not present

## 2020-07-18 DIAGNOSIS — R410 Disorientation, unspecified: Secondary | ICD-10-CM | POA: Diagnosis not present

## 2020-07-18 DIAGNOSIS — Z8249 Family history of ischemic heart disease and other diseases of the circulatory system: Secondary | ICD-10-CM

## 2020-07-18 DIAGNOSIS — R296 Repeated falls: Secondary | ICD-10-CM | POA: Diagnosis not present

## 2020-07-18 DIAGNOSIS — R6889 Other general symptoms and signs: Secondary | ICD-10-CM | POA: Diagnosis not present

## 2020-07-18 DIAGNOSIS — R2681 Unsteadiness on feet: Secondary | ICD-10-CM | POA: Diagnosis not present

## 2020-07-18 DIAGNOSIS — T50905A Adverse effect of unspecified drugs, medicaments and biological substances, initial encounter: Secondary | ICD-10-CM | POA: Diagnosis not present

## 2020-07-18 DIAGNOSIS — C3400 Malignant neoplasm of unspecified main bronchus: Secondary | ICD-10-CM | POA: Diagnosis not present

## 2020-07-18 DIAGNOSIS — Z96653 Presence of artificial knee joint, bilateral: Secondary | ICD-10-CM | POA: Diagnosis not present

## 2020-07-18 DIAGNOSIS — M8580 Other specified disorders of bone density and structure, unspecified site: Secondary | ICD-10-CM | POA: Diagnosis present

## 2020-07-18 DIAGNOSIS — R918 Other nonspecific abnormal finding of lung field: Secondary | ICD-10-CM | POA: Diagnosis not present

## 2020-07-18 DIAGNOSIS — Z743 Need for continuous supervision: Secondary | ICD-10-CM | POA: Diagnosis not present

## 2020-07-18 DIAGNOSIS — J9621 Acute and chronic respiratory failure with hypoxia: Secondary | ICD-10-CM | POA: Diagnosis not present

## 2020-07-18 DIAGNOSIS — J449 Chronic obstructive pulmonary disease, unspecified: Secondary | ICD-10-CM | POA: Diagnosis present

## 2020-07-18 DIAGNOSIS — G928 Other toxic encephalopathy: Secondary | ICD-10-CM | POA: Diagnosis not present

## 2020-07-18 DIAGNOSIS — Z85118 Personal history of other malignant neoplasm of bronchus and lung: Secondary | ICD-10-CM

## 2020-07-18 DIAGNOSIS — R278 Other lack of coordination: Secondary | ICD-10-CM | POA: Diagnosis not present

## 2020-07-18 DIAGNOSIS — I129 Hypertensive chronic kidney disease with stage 1 through stage 4 chronic kidney disease, or unspecified chronic kidney disease: Secondary | ICD-10-CM | POA: Diagnosis present

## 2020-07-18 DIAGNOSIS — J9811 Atelectasis: Secondary | ICD-10-CM | POA: Diagnosis present

## 2020-07-18 DIAGNOSIS — R062 Wheezing: Secondary | ICD-10-CM

## 2020-07-18 DIAGNOSIS — Z880 Allergy status to penicillin: Secondary | ICD-10-CM

## 2020-07-18 DIAGNOSIS — E876 Hypokalemia: Secondary | ICD-10-CM | POA: Diagnosis not present

## 2020-07-18 DIAGNOSIS — J439 Emphysema, unspecified: Secondary | ICD-10-CM | POA: Diagnosis present

## 2020-07-18 DIAGNOSIS — F05 Delirium due to known physiological condition: Secondary | ICD-10-CM | POA: Diagnosis present

## 2020-07-18 DIAGNOSIS — Z902 Acquired absence of lung [part of]: Secondary | ICD-10-CM

## 2020-07-18 DIAGNOSIS — R519 Headache, unspecified: Secondary | ICD-10-CM | POA: Diagnosis not present

## 2020-07-18 DIAGNOSIS — Z9119 Patient's noncompliance with other medical treatment and regimen: Secondary | ICD-10-CM

## 2020-07-18 DIAGNOSIS — Z96643 Presence of artificial hip joint, bilateral: Secondary | ICD-10-CM | POA: Diagnosis present

## 2020-07-18 DIAGNOSIS — R0602 Shortness of breath: Secondary | ICD-10-CM

## 2020-07-18 DIAGNOSIS — K429 Umbilical hernia without obstruction or gangrene: Secondary | ICD-10-CM | POA: Diagnosis present

## 2020-07-18 DIAGNOSIS — R488 Other symbolic dysfunctions: Secondary | ICD-10-CM | POA: Diagnosis not present

## 2020-07-18 DIAGNOSIS — Z87891 Personal history of nicotine dependence: Secondary | ICD-10-CM

## 2020-07-18 DIAGNOSIS — I10 Essential (primary) hypertension: Secondary | ICD-10-CM | POA: Diagnosis present

## 2020-07-18 DIAGNOSIS — R531 Weakness: Secondary | ICD-10-CM | POA: Diagnosis not present

## 2020-07-18 DIAGNOSIS — Z7952 Long term (current) use of systemic steroids: Secondary | ICD-10-CM

## 2020-07-18 DIAGNOSIS — Z8589 Personal history of malignant neoplasm of other organs and systems: Secondary | ICD-10-CM | POA: Diagnosis not present

## 2020-07-18 DIAGNOSIS — G9341 Metabolic encephalopathy: Secondary | ICD-10-CM | POA: Diagnosis not present

## 2020-07-18 DIAGNOSIS — Z79899 Other long term (current) drug therapy: Secondary | ICD-10-CM

## 2020-07-18 DIAGNOSIS — Y813 Surgical instruments, materials and general- and plastic-surgery devices (including sutures) associated with adverse incidents: Secondary | ICD-10-CM | POA: Diagnosis present

## 2020-07-18 DIAGNOSIS — Z781 Physical restraint status: Secondary | ICD-10-CM

## 2020-07-18 DIAGNOSIS — E1122 Type 2 diabetes mellitus with diabetic chronic kidney disease: Secondary | ICD-10-CM | POA: Diagnosis present

## 2020-07-18 DIAGNOSIS — R2689 Other abnormalities of gait and mobility: Secondary | ICD-10-CM | POA: Diagnosis not present

## 2020-07-18 DIAGNOSIS — Z888 Allergy status to other drugs, medicaments and biological substances status: Secondary | ICD-10-CM

## 2020-07-18 DIAGNOSIS — I700 Atherosclerosis of aorta: Secondary | ICD-10-CM | POA: Diagnosis not present

## 2020-07-18 DIAGNOSIS — M2550 Pain in unspecified joint: Secondary | ICD-10-CM | POA: Diagnosis not present

## 2020-07-18 DIAGNOSIS — Z882 Allergy status to sulfonamides status: Secondary | ICD-10-CM

## 2020-07-18 HISTORY — PX: INGUINAL HERNIA REPAIR: SHX194

## 2020-07-18 HISTORY — PX: VENTRAL HERNIA REPAIR: SHX424

## 2020-07-18 SURGERY — REPAIR, HERNIA, VENTRAL, LAPAROSCOPIC
Anesthesia: General

## 2020-07-18 MED ORDER — FENTANYL CITRATE (PF) 100 MCG/2ML IJ SOLN: 25.0000 ug | INTRAMUSCULAR | Status: DC | PRN

## 2020-07-18 MED ORDER — CHLORHEXIDINE GLUCONATE CLOTH 2 % EX PADS: 6.0000 | MEDICATED_PAD | Freq: Once | CUTANEOUS | Status: DC

## 2020-07-18 MED ORDER — ORAL CARE MOUTH RINSE: 15.0000 mL | Freq: Once | OROMUCOSAL | Status: AC

## 2020-07-18 MED ORDER — BUPIVACAINE-EPINEPHRINE 0.25% -1:200000 IJ SOLN
INTRAMUSCULAR | Status: DC | PRN
  Administered 2020-07-18: 50 mL

## 2020-07-18 MED ORDER — EPHEDRINE SULFATE 50 MG/ML IJ SOLN
INTRAMUSCULAR | Status: DC | PRN
  Administered 2020-07-18 (×4): 10 mg via INTRAVENOUS

## 2020-07-18 MED ORDER — ALBUTEROL SULFATE (2.5 MG/3ML) 0.083% IN NEBU
INHALATION_SOLUTION | RESPIRATORY_TRACT | Status: AC
  Filled 2020-07-18: qty 3

## 2020-07-18 MED ORDER — METHOCARBAMOL 1000 MG/10ML IJ SOLN
1000.0000 mg | Freq: Four times a day (QID) | INTRAVENOUS | Status: DC | PRN
  Filled 2020-07-18: qty 10

## 2020-07-18 MED ORDER — 0.9 % SODIUM CHLORIDE (POUR BTL) OPTIME
TOPICAL | Status: DC | PRN
  Administered 2020-07-18: 1000 mL

## 2020-07-18 MED ORDER — ROCURONIUM BROMIDE 10 MG/ML (PF) SYRINGE
PREFILLED_SYRINGE | INTRAVENOUS | Status: DC | PRN
  Administered 2020-07-18: 5 mg via INTRAVENOUS
  Administered 2020-07-18: 40 mg via INTRAVENOUS
  Administered 2020-07-18: 10 mg via INTRAVENOUS

## 2020-07-18 MED ORDER — ENSURE PRE-SURGERY PO LIQD
296.0000 mL | Freq: Once | ORAL | Status: DC
  Filled 2020-07-18: qty 296

## 2020-07-18 MED ORDER — IPRATROPIUM-ALBUTEROL 0.5-2.5 (3) MG/3ML IN SOLN: 3.0000 mL | RESPIRATORY_TRACT | Status: DC

## 2020-07-18 MED ORDER — GABAPENTIN 300 MG PO CAPS
300.0000 mg | ORAL_CAPSULE | Freq: Two times a day (BID) | ORAL | Status: DC
  Administered 2020-07-18 – 2020-07-19 (×2): 300 mg via ORAL
  Filled 2020-07-18 (×2): qty 1

## 2020-07-18 MED ORDER — ROCURONIUM BROMIDE 10 MG/ML (PF) SYRINGE
PREFILLED_SYRINGE | INTRAVENOUS | Status: AC
  Filled 2020-07-18: qty 10

## 2020-07-18 MED ORDER — TRAMADOL HCL 50 MG PO TABS: 50.0000 mg | ORAL_TABLET | Freq: Four times a day (QID) | ORAL | Status: DC | PRN

## 2020-07-18 MED ORDER — BUPIVACAINE-EPINEPHRINE (PF) 0.25% -1:200000 IJ SOLN
INTRAMUSCULAR | Status: AC
  Filled 2020-07-18: qty 60

## 2020-07-18 MED ORDER — SODIUM CHLORIDE 0.9 % IV SOLN: 250.0000 mL | INTRAVENOUS | Status: DC | PRN

## 2020-07-18 MED ORDER — VITAMIN B-12 1000 MCG PO TABS
1000.0000 ug | ORAL_TABLET | Freq: Every day | ORAL | Status: DC
  Administered 2020-07-18 – 2020-07-24 (×7): 1000 ug via ORAL
  Filled 2020-07-18 (×8): qty 1

## 2020-07-18 MED ORDER — MAGNESIUM HYDROXIDE 400 MG/5ML PO SUSP: 30.0000 mL | Freq: Every day | ORAL | Status: DC | PRN

## 2020-07-18 MED ORDER — VITAMIN D 25 MCG (1000 UNIT) PO TABS
1000.0000 [IU] | ORAL_TABLET | Freq: Every day | ORAL | Status: DC
  Administered 2020-07-18 – 2020-07-24 (×7): 1000 [IU] via ORAL
  Filled 2020-07-18 (×7): qty 1

## 2020-07-18 MED ORDER — CHLORHEXIDINE GLUCONATE 0.12 % MT SOLN
15.0000 mL | Freq: Once | OROMUCOSAL | Status: AC
  Administered 2020-07-18: 15 mL via OROMUCOSAL

## 2020-07-18 MED ORDER — LACTATED RINGERS IV SOLN: INTRAVENOUS | Status: DC

## 2020-07-18 MED ORDER — ALBUTEROL SULFATE HFA 108 (90 BASE) MCG/ACT IN AERS
INHALATION_SPRAY | RESPIRATORY_TRACT | Status: DC | PRN
  Administered 2020-07-18 (×2): 2 via RESPIRATORY_TRACT

## 2020-07-18 MED ORDER — FLUTICASONE FUROATE-VILANTEROL 200-25 MCG/INH IN AEPB
1.0000 | INHALATION_SPRAY | Freq: Every day | RESPIRATORY_TRACT | Status: DC
  Administered 2020-07-19 – 2020-07-24 (×6): 1 via RESPIRATORY_TRACT
  Filled 2020-07-18: qty 28

## 2020-07-18 MED ORDER — LIDOCAINE HCL (PF) 2 % IJ SOLN
INTRAMUSCULAR | Status: AC
  Filled 2020-07-18: qty 5

## 2020-07-18 MED ORDER — ENSURE PRE-SURGERY PO LIQD
592.0000 mL | Freq: Once | ORAL | Status: DC
  Filled 2020-07-18: qty 592

## 2020-07-18 MED ORDER — FENTANYL CITRATE (PF) 250 MCG/5ML IJ SOLN
INTRAMUSCULAR | Status: AC
  Filled 2020-07-18: qty 5

## 2020-07-18 MED ORDER — AMLODIPINE BESYLATE 5 MG PO TABS
5.0000 mg | ORAL_TABLET | Freq: Every day | ORAL | Status: DC
  Administered 2020-07-18 – 2020-07-24 (×7): 5 mg via ORAL
  Filled 2020-07-18 (×8): qty 1

## 2020-07-18 MED ORDER — ROSUVASTATIN CALCIUM 5 MG PO TABS
5.0000 mg | ORAL_TABLET | Freq: Every day | ORAL | Status: DC
  Administered 2020-07-18 – 2020-07-24 (×7): 5 mg via ORAL
  Filled 2020-07-18 (×8): qty 1

## 2020-07-18 MED ORDER — LIDOCAINE HCL (PF) 2 % IJ SOLN
INTRAMUSCULAR | Status: AC
  Filled 2020-07-18: qty 10

## 2020-07-18 MED ORDER — DIPHENHYDRAMINE HCL 12.5 MG/5ML PO ELIX: 12.5000 mg | ORAL_SOLUTION | Freq: Four times a day (QID) | ORAL | Status: DC | PRN

## 2020-07-18 MED ORDER — UMECLIDINIUM BROMIDE 62.5 MCG/INH IN AEPB
1.0000 | INHALATION_SPRAY | Freq: Every day | RESPIRATORY_TRACT | Status: DC
  Administered 2020-07-19 – 2020-07-24 (×6): 1 via RESPIRATORY_TRACT
  Filled 2020-07-18: qty 7

## 2020-07-18 MED ORDER — STERILE WATER FOR IRRIGATION IR SOLN
Status: DC | PRN
  Administered 2020-07-18: 2000 mL

## 2020-07-18 MED ORDER — ACETAMINOPHEN 10 MG/ML IV SOLN: 1000.0000 mg | Freq: Once | INTRAVENOUS | Status: DC | PRN

## 2020-07-18 MED ORDER — EPHEDRINE 5 MG/ML INJ
INTRAVENOUS | Status: AC
  Filled 2020-07-18: qty 10

## 2020-07-18 MED ORDER — METOPROLOL TARTRATE 5 MG/5ML IV SOLN: 5.0000 mg | Freq: Four times a day (QID) | INTRAVENOUS | Status: DC | PRN

## 2020-07-18 MED ORDER — ONDANSETRON HCL 4 MG/2ML IJ SOLN
INTRAMUSCULAR | Status: DC | PRN
  Administered 2020-07-18: 4 mg via INTRAVENOUS

## 2020-07-18 MED ORDER — BUPIVACAINE LIPOSOME 1.3 % IJ SUSP
INTRAMUSCULAR | Status: DC | PRN
  Administered 2020-07-18: 20 mL

## 2020-07-18 MED ORDER — ALBUTEROL SULFATE HFA 108 (90 BASE) MCG/ACT IN AERS
INHALATION_SPRAY | RESPIRATORY_TRACT | Status: AC
  Filled 2020-07-18: qty 6.7

## 2020-07-18 MED ORDER — LIP MEDEX EX OINT
TOPICAL_OINTMENT | CUTANEOUS | Status: AC
  Filled 2020-07-18: qty 7

## 2020-07-18 MED ORDER — PROPOFOL 10 MG/ML IV BOLUS
INTRAVENOUS | Status: DC | PRN
  Administered 2020-07-18: 100 mg via INTRAVENOUS

## 2020-07-18 MED ORDER — DEXAMETHASONE SODIUM PHOSPHATE 10 MG/ML IJ SOLN
INTRAMUSCULAR | Status: AC
  Filled 2020-07-18: qty 1

## 2020-07-18 MED ORDER — FLUOXETINE HCL 20 MG PO CAPS
20.0000 mg | ORAL_CAPSULE | Freq: Two times a day (BID) | ORAL | Status: DC
  Administered 2020-07-18 – 2020-07-24 (×12): 20 mg via ORAL
  Filled 2020-07-18 (×12): qty 1

## 2020-07-18 MED ORDER — DIPHENHYDRAMINE HCL 50 MG/ML IJ SOLN
12.5000 mg | Freq: Four times a day (QID) | INTRAMUSCULAR | Status: DC | PRN
Start: 2020-07-18 — End: 2020-07-21

## 2020-07-18 MED ORDER — ACETAMINOPHEN 500 MG PO TABS
1000.0000 mg | ORAL_TABLET | ORAL | Status: AC
  Administered 2020-07-18: 1000 mg via ORAL
  Filled 2020-07-18: qty 2

## 2020-07-18 MED ORDER — PROPOFOL 10 MG/ML IV BOLUS
INTRAVENOUS | Status: AC
  Filled 2020-07-18: qty 20

## 2020-07-18 MED ORDER — ENOXAPARIN SODIUM 40 MG/0.4ML ~~LOC~~ SOLN
40.0000 mg | SUBCUTANEOUS | Status: DC
  Administered 2020-07-19 – 2020-07-24 (×6): 40 mg via SUBCUTANEOUS
  Filled 2020-07-18 (×6): qty 0.4

## 2020-07-18 MED ORDER — ALBUTEROL SULFATE (2.5 MG/3ML) 0.083% IN NEBU
2.5000 mg | INHALATION_SOLUTION | Freq: Four times a day (QID) | RESPIRATORY_TRACT | Status: DC | PRN
  Administered 2020-07-18: 2.5 mg via RESPIRATORY_TRACT

## 2020-07-18 MED ORDER — SCOPOLAMINE 1 MG/3DAYS TD PT72
1.0000 | MEDICATED_PATCH | TRANSDERMAL | Status: DC
  Administered 2020-07-18: 1.5 mg via TRANSDERMAL
  Filled 2020-07-18: qty 1

## 2020-07-18 MED ORDER — DEXAMETHASONE SODIUM PHOSPHATE 10 MG/ML IJ SOLN
INTRAMUSCULAR | Status: DC | PRN
  Administered 2020-07-18: 5 mg via INTRAVENOUS

## 2020-07-18 MED ORDER — SUGAMMADEX SODIUM 200 MG/2ML IV SOLN
INTRAVENOUS | Status: DC | PRN
  Administered 2020-07-18: 150 mg via INTRAVENOUS

## 2020-07-18 MED ORDER — DEXAMETHASONE SODIUM PHOSPHATE 4 MG/ML IJ SOLN: 4.0000 mg | INTRAMUSCULAR | Status: DC

## 2020-07-18 MED ORDER — BUDESON-GLYCOPYRROL-FORMOTEROL 160-9-4.8 MCG/ACT IN AERO: 2.0000 | INHALATION_SPRAY | Freq: Two times a day (BID) | RESPIRATORY_TRACT | Status: DC

## 2020-07-18 MED ORDER — ALBUTEROL SULFATE HFA 108 (90 BASE) MCG/ACT IN AERS
2.0000 | INHALATION_SPRAY | Freq: Four times a day (QID) | RESPIRATORY_TRACT | Status: DC | PRN
  Filled 2020-07-18: qty 6.7

## 2020-07-18 MED ORDER — ONDANSETRON HCL 4 MG/2ML IJ SOLN
INTRAMUSCULAR | Status: AC
  Filled 2020-07-18: qty 2

## 2020-07-18 MED ORDER — SIMETHICONE 80 MG PO CHEW: 40.0000 mg | CHEWABLE_TABLET | Freq: Four times a day (QID) | ORAL | Status: DC | PRN

## 2020-07-18 MED ORDER — ACETAMINOPHEN 500 MG PO TABS
1000.0000 mg | ORAL_TABLET | Freq: Four times a day (QID) | ORAL | Status: DC
  Administered 2020-07-18 – 2020-07-21 (×8): 1000 mg via ORAL
  Filled 2020-07-18 (×8): qty 2

## 2020-07-18 MED ORDER — FENTANYL CITRATE (PF) 100 MCG/2ML IJ SOLN
INTRAMUSCULAR | Status: DC | PRN
  Administered 2020-07-18 (×3): 50 ug via INTRAVENOUS
  Administered 2020-07-18: 25 ug via INTRAVENOUS

## 2020-07-18 MED ORDER — SODIUM CHLORIDE 0.9 % IV SOLN: Freq: Three times a day (TID) | INTRAVENOUS | Status: AC | PRN

## 2020-07-18 MED ORDER — SODIUM CHLORIDE 0.9% FLUSH
3.0000 mL | Freq: Two times a day (BID) | INTRAVENOUS | Status: DC
  Administered 2020-07-19 – 2020-07-24 (×6): 3 mL via INTRAVENOUS

## 2020-07-18 MED ORDER — SODIUM CHLORIDE 0.9% FLUSH: 3.0000 mL | INTRAVENOUS | Status: DC | PRN

## 2020-07-18 MED ORDER — ONDANSETRON HCL 4 MG/2ML IJ SOLN: 4.0000 mg | Freq: Once | INTRAMUSCULAR | Status: DC | PRN

## 2020-07-18 MED ORDER — GABAPENTIN 300 MG PO CAPS
300.0000 mg | ORAL_CAPSULE | ORAL | Status: AC
  Administered 2020-07-18: 300 mg via ORAL
  Filled 2020-07-18: qty 1

## 2020-07-18 MED ORDER — METHOCARBAMOL 500 MG PO TABS
500.0000 mg | ORAL_TABLET | Freq: Four times a day (QID) | ORAL | Status: DC | PRN
  Administered 2020-07-20: 500 mg via ORAL
  Filled 2020-07-18: qty 1

## 2020-07-18 MED ORDER — LIDOCAINE 2% (20 MG/ML) 5 ML SYRINGE
INTRAMUSCULAR | Status: DC | PRN
  Administered 2020-07-18: 60 mg via INTRAVENOUS

## 2020-07-18 MED ORDER — BISACODYL 10 MG RE SUPP: 10.0000 mg | Freq: Every day | RECTAL | Status: DC | PRN

## 2020-07-18 SURGICAL SUPPLY — 46 items
APPLIER CLIP 5 13 M/L LIGAMAX5 (MISCELLANEOUS)
BINDER ABDOMINAL 12 ML 46-62 (SOFTGOODS) IMPLANT
CABLE HIGH FREQUENCY MONO STRZ (ELECTRODE) ×3 IMPLANT
CHLORAPREP W/TINT 26 (MISCELLANEOUS) ×3 IMPLANT
CLIP APPLIE 5 13 M/L LIGAMAX5 (MISCELLANEOUS) IMPLANT
COVER SURGICAL LIGHT HANDLE (MISCELLANEOUS) ×3 IMPLANT
COVER WAND RF STERILE (DRAPES) ×3 IMPLANT
DECANTER SPIKE VIAL GLASS SM (MISCELLANEOUS) ×3 IMPLANT
DEVICE SECURE STRAP 25 ABSORB (INSTRUMENTS) ×6 IMPLANT
DEVICE TROCAR PUNCTURE CLOSURE (ENDOMECHANICALS) ×3 IMPLANT
DRAPE WARM FLUID 44X44 (DRAPES) ×3 IMPLANT
DRSG TEGADERM 2-3/8X2-3/4 SM (GAUZE/BANDAGES/DRESSINGS) ×9 IMPLANT
DRSG TEGADERM 4X4.75 (GAUZE/BANDAGES/DRESSINGS) ×3 IMPLANT
ELECT REM PT RETURN 15FT ADLT (MISCELLANEOUS) ×3 IMPLANT
GAUZE SPONGE 2X2 8PLY STRL LF (GAUZE/BANDAGES/DRESSINGS) IMPLANT
GLOVE ECLIPSE 8.0 STRL XLNG CF (GLOVE) ×3 IMPLANT
GLOVE INDICATOR 8.0 STRL GRN (GLOVE) ×3 IMPLANT
GOWN STRL REUS W/TWL XL LVL3 (GOWN DISPOSABLE) ×6 IMPLANT
IRRIG SUCT STRYKERFLOW 2 WTIP (MISCELLANEOUS) ×3
IRRIGATION SUCT STRKRFLW 2 WTP (MISCELLANEOUS) ×2 IMPLANT
KIT BASIN OR (CUSTOM PROCEDURE TRAY) ×3 IMPLANT
KIT TURNOVER KIT A (KITS) IMPLANT
MARKER SKIN DUAL TIP RULER LAB (MISCELLANEOUS) ×3 IMPLANT
MESH HERNIA 6X6 BARD (Mesh General) ×2 IMPLANT
MESH HERNIA BARD 6X6 (Mesh General) ×1 IMPLANT
MESH ULTRAPRO 6X6 15CM15CM (Mesh General) ×6 IMPLANT
NEEDLE SPNL 22GX3.5 QUINCKE BK (NEEDLE) IMPLANT
PAD POSITIONING PINK XL (MISCELLANEOUS) ×3 IMPLANT
PENCIL SMOKE EVACUATOR (MISCELLANEOUS) IMPLANT
SCISSORS LAP 5X35 DISP (ENDOMECHANICALS) ×3 IMPLANT
SET TUBE SMOKE EVAC HIGH FLOW (TUBING) ×3 IMPLANT
SLEEVE ADV FIXATION 5X100MM (TROCAR) ×9 IMPLANT
SPONGE GAUZE 2X2 8PLY STRL LF (GAUZE/BANDAGES/DRESSINGS) ×3 IMPLANT
SPONGE GAUZE 2X2 STER 10/PKG (GAUZE/BANDAGES/DRESSINGS)
STRIP CLOSURE SKIN 1/2X4 (GAUZE/BANDAGES/DRESSINGS) ×6 IMPLANT
SUT MNCRL AB 4-0 PS2 18 (SUTURE) ×3 IMPLANT
SUT PDS AB 1 CT1 27 (SUTURE) ×6 IMPLANT
SUT PROLENE 1 CT 1 30 (SUTURE) ×15 IMPLANT
SUT VIC AB 2-0 SH 27 (SUTURE) ×2
SUT VIC AB 2-0 SH 27X BRD (SUTURE) ×2 IMPLANT
TAPE STRIPS DRAPE STRL (GAUZE/BANDAGES/DRESSINGS) ×3 IMPLANT
TOWEL OR 17X26 10 PK STRL BLUE (TOWEL DISPOSABLE) ×3 IMPLANT
TRAY LAPAROSCOPIC (CUSTOM PROCEDURE TRAY) ×3 IMPLANT
TROCAR ADV FIXATION 11X100MM (TROCAR) IMPLANT
TROCAR ADV FIXATION 5X100MM (TROCAR) ×3 IMPLANT
TROCAR BLADELESS OPT 5 100 (ENDOMECHANICALS) ×3 IMPLANT

## 2020-07-18 NOTE — Anesthesia Procedure Notes (Signed)
Procedure Name: Intubation Date/Time: 07/18/2020 8:47 AM Performed by: Maxwell Caul, CRNA Pre-anesthesia Checklist: Patient identified, Emergency Drugs available, Suction available and Patient being monitored Patient Re-evaluated:Patient Re-evaluated prior to induction Oxygen Delivery Method: Circle system utilized Preoxygenation: Pre-oxygenation with 100% oxygen Induction Type: IV induction Ventilation: Mask ventilation without difficulty Laryngoscope Size: Mac and 4 Grade View: Grade I Tube type: Oral Tube size: 7.0 mm Number of attempts: 1 Airway Equipment and Method: Stylet Placement Confirmation: ETT inserted through vocal cords under direct vision,  positive ETCO2 and breath sounds checked- equal and bilateral Secured at: 21 cm Tube secured with: Tape Dental Injury: Teeth and Oropharynx as per pre-operative assessment

## 2020-07-18 NOTE — H&P (Signed)
[name]  DOB: 06-29-1941  ` ` Patient sent for surgical consultation at the request of Caren Macadam, MD  Chief Complaint: Left lower abdominal swelling. Possible hernia. ` ` The patient is a pleasant woman history of COPD. Walks with a cane. Tries to avoid using oxygen. 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 She's noticed some swelling in her lower abdomen. Concern for hernia. Discussed with primary care physician. Hernia suspected. Surgical consultation offered. She is followed by Dr. Chase Caller with [facility]. Recently switched inhalers and claims to be doing well. There is been recommendation for oxygen at night that she does not use it. She can maybe walk a block or 2 with a cane before she has to stop due to joint pain. She no longer smokes. Diabetes. She recalls having some hernia repair when she was up in [place] in the 1990s. I'm guessing it was at the bellybutton. Normally moves her bowels several times a day. Drinks about a pot of coffee a day.   No personal nor family history of GI/colon cancer, inflammatory bowel disease, irritable bowel syndrome, allergy such as Celiac Sprue, dietary/dairy problems, colitis, ulcers nor gastritis. No recent sick contacts/gastroenteritis. No travel outside the country. No changes in diet. No dysphagia to solids or liquids. No significant heartburn or reflux. No melena, hematemesis, coffee ground emesis. No evidence of prior gastric/peptic ulceration.  (Review of systems as stated in this history (HPI) or in the review of systems. Otherwise all other 12 point ROS are negative) ` ` ###########################################`  This patient encounter took 30 minutes today to perform the following: obtain history, perform exam, review outside records, interpret tests & imaging, counsel the patient on their diagnosis; and, document this encounter, including findings & plan in the  electronic health record (EHR).   Past Surgical History Darden Palmer, [place]; 06/02/2020 10:41 AM) Cataract Surgery Bilateral. Hysterectomy (not due to cancer) - Complete Knee Surgery Bilateral. Lung Surgery Right. Nephrectomy Bilateral. Shoulder Surgery Bilateral. Tonsillectomy  Diagnostic Studies History Darden Palmer, [place]; 06/02/2020 10:41 AM) Colonoscopy 1-5 years ago Mammogram within last year Pap Smear >5 years ago  Allergies Darden Palmer, RMA; 06/02/2020 10:44 AM) Clindamycin HCl *CHEMICALS* Nausea, Vomiting. Penicillin G Pot in Dextrose *PENICILLINS* Rash. Spiriva HandiHaler *ANTIASTHMATIC AND BRONCHODILATOR AGENTS* Sulfacetamide *CHEMICALS* Hives, Rash. Allergies Reconciled  Medication History Darden Palmer, [place]; 06/02/2020 10:47 AM) amLODIPine Besylate (5MG  Tablet, Oral) Active. Tylenol (Oral) Specific strength unknown - Active. Budeson-Glycopyrrol-Formoterol (160-9-4.8MCG/ACT Aerosol, Inhalation) Active. FLUoxetine HCl (20MG  Capsule, Oral) Active. ProAir HFA (108 (90 Base)MCG/ACT Aerosol Soln, Inhalation) Active. Rosuvastatin Calcium (5MG  Tablet, Oral) Active. Medications Reconciled  Social History Darden Palmer, [place]; 06/02/2020 10:41 AM) Alcohol use Moderate alcohol use. Caffeine use Coffee. No drug use Tobacco use Former smoker.  Family History Darden Palmer, [place]; 06/02/2020 10:41 AM) Arthritis Brother, Father. Heart Disease Brother, Father. Respiratory Condition Father.  Pregnancy / Birth History Darden Palmer, [place]; 06/02/2020 10:41 AM) Age at menarche 11 years. Age of menopause 51-55 Contraceptive History Oral contraceptives. Gravida 2 Irregular periods Maternal age 108-20 Para 0  Other Problems Darden Palmer, [place]; 06/02/2020 10:41 AM) Arthritis Back Pain Chronic Obstructive Lung Disease Depression High blood pressure Inguinal Hernia Lung Cancer Migraine  Headache Oophorectomy     Review of Systems Darden Palmer RMA; 06/02/2020 10:41 AM) General Not Present- Appetite Loss, Chills, Fatigue, Fever, Night Sweats, Weight Gain and Weight Loss. Skin Not Present- Change in Wart/Mole, Dryness, Hives, Jaundice, New Lesions, Non-Healing Wounds, Rash and Ulcer. HEENT Present- Seasonal  Allergies and Wears glasses/contact lenses. Not Present- Earache, Hearing Loss, Hoarseness, Nose Bleed, Oral Ulcers, Ringing in the Ears, Sinus Pain, Sore Throat, Visual Disturbances and Yellow Eyes. Respiratory Present- Chronic Cough and Difficulty Breathing. Not Present- Bloody sputum, Snoring and Wheezing. Breast Not Present- Breast Mass, Breast Pain, Nipple Discharge and Skin Changes. Cardiovascular Not Present- Chest Pain, Difficulty Breathing Lying Down, Leg Cramps, Palpitations, Rapid Heart Rate, Shortness of Breath and Swelling of Extremities. Gastrointestinal Present- Chronic diarrhea. Not Present- Abdominal Pain, Bloating, Bloody Stool, Change in Bowel Habits, Constipation, Difficulty Swallowing, Excessive gas, Gets full quickly at meals, Hemorrhoids, Indigestion, Nausea, Rectal Pain and Vomiting. Female Genitourinary Not Present- Frequency, Nocturia, Painful Urination, Pelvic Pain and Urgency. Musculoskeletal Present- Back Pain, Joint Pain and Joint Stiffness. Not Present- Muscle Pain, Muscle Weakness and Swelling of Extremities. Neurological Present- Trouble walking and Weakness. Not Present- Decreased Memory, Fainting, Headaches, Numbness, Seizures, Tingling and Tremor. Psychiatric Not Present- Anxiety, Bipolar, Change in Sleep Pattern, Depression, Fearful and Frequent crying. Endocrine Not Present- Cold Intolerance, Excessive Hunger, Hair Changes, Heat Intolerance, Hot flashes and New Diabetes. Hematology Not Present- Blood Thinners, Easy Bruising, Excessive bleeding, Gland problems, HIV and Persistent Infections.  Vitals Lattie Haw [place] RMA; 06/02/2020  10:44 AM) 06/02/2020 10:43 AM Weight: 119.38 lb Height: 62in Body Surface Area: 1.54 m Body Mass Index: 21.83 kg/m  Temp.: 54F  Pulse: 76 (Regular)  P.OX: 94% (Room air) BP: 106/68(Sitting, Left Arm, Standard)   BP (!) 132/97   Pulse 80   Temp 98.1 F (36.7 C) (Oral)   Resp 16   Wt 54.9 kg   SpO2 96%   BMI 22.13 kg/m       Physical Exam Adin Hector MD; 06/02/2020 1:14 PM)  General Mental Status-Alert. General Appearance-Not in acute distress, Not Sickly. Orientation-Oriented X3. Hydration-Well hydrated. Voice-Normal.  Integumentary Global Assessment Upon inspection and palpation of skin surfaces of the - Axillae: non-tender, no inflammation or ulceration, no drainage. and Distribution of scalp and body hair is normal. General Characteristics Temperature - normal warmth is noted.  Head and Neck Head-normocephalic, atraumatic with no lesions or palpable masses. Face Global Assessment - atraumatic, no absence of expression. Neck Global Assessment - no abnormal movements, no bruit auscultated on the right, no bruit auscultated on the left, no decreased range of motion, non-tender. Trachea-midline. Thyroid Gland Characteristics - non-tender.  Eye Eyeball - Left-Extraocular movements intact, No Nystagmus - Left. Eyeball - Right-Extraocular movements intact, No Nystagmus - Right. Cornea - Left-No Hazy - Left. Cornea - Right-No Hazy - Right. Sclera/Conjunctiva - Left-No scleral icterus, No Discharge - Left. Sclera/Conjunctiva - Right-No scleral icterus, No Discharge - Right. Pupil - Left-Direct reaction to light normal. Pupil - Right-Direct reaction to light normal. Note: Wears glasses. Vision corrected  ENMT Ears Pinna - Left - no drainage observed, no generalized tenderness observed. Pinna - Right - no drainage observed, no generalized tenderness observed. Nose and Sinuses External Inspection of the  Nose - no destructive lesion observed. Inspection of the nares - Left - quiet respiration. Inspection of the nares - Right - quiet respiration. Mouth and Throat Lips - Upper Lip - no fissures observed, no pallor noted. Lower Lip - no fissures observed, no pallor noted. Nasopharynx - no discharge present. Oral Cavity/Oropharynx - Tongue - no dryness observed. Oral Mucosa - no cyanosis observed. Hypopharynx - no evidence of airway distress observed.  Chest and Lung Exam Inspection Movements - Normal and Symmetrical. Accessory muscles - No use of accessory muscles in breathing. Palpation  Palpation of the chest reveals - Non-tender. Auscultation Breath sounds - Normal and Clear.  Cardiovascular Auscultation Rhythm - Regular. Murmurs & Other Heart Sounds - Auscultation of the heart reveals - No Murmurs and No Systolic Clicks.  Abdomen Inspection Inspection of the abdomen reveals - No Visible peristalsis and No Abnormal pulsations. Umbilicus - No Bleeding, No Urine drainage. Palpation/Percussion Palpation and Percussion of the abdomen reveal - Soft, Non Tender, No Rebound tenderness, No Rigidity (guarding) and No Cutaneous hyperesthesia. Note: Abdomen soft. Not severely distended. Periumbilical vertical incision. No obvious. Umbilical hernia but in the left infraumbilical paramedian region is some fullness suspicious for a possible spigelian hernia. Along left lower quadrant somewhat lateral is an obvious hernia that reduces down. Somewhat lateral for an inguinal hernia but possibly given her stretched out abdomen.  Female Genitourinary Sexual Maturity Tanner 5 - Adult hair pattern. Note: No vaginal bleeding nor discharge. Bulging in left lateral groin. Possible low Spigelian hernia versus an indirect inguinal hernia on the left side. No obvious bulging or hernia on the right  Peripheral Vascular Upper Extremity Inspection - Left - No Cyanotic nailbeds - Left, Not Ischemic.  Inspection - Right - No Cyanotic nailbeds - Right, Not Ischemic.  Neurologic Neurologic evaluation reveals -normal attention span and ability to concentrate, able to name objects and repeat phrases. Appropriate fund of knowledge , normal sensation and normal coordination. Mental Status Affect - not angry, not paranoid. Cranial Nerves-Normal Bilaterally. Gait-Normal.  Neuropsychiatric Mental status exam performed with findings of-able to articulate well with normal speech/language, rate, volume and coherence, thought content normal with ability to perform basic computations and apply abstract reasoning and no evidence of hallucinations, delusions, obsessions or homicidal/suicidal ideation.  Musculoskeletal Global Assessment Spine, Ribs and Pelvis - no instability, subluxation or laxity. Right Upper Extremity - no instability, subluxation or laxity.  Lymphatic Head & Neck  General Head & Neck Lymphatics: Bilateral - Description - No Localized lymphadenopathy. Axillary  General Axillary Region: Bilateral - Description - No Localized lymphadenopathy. Femoral & Inguinal  Generalized Femoral & Inguinal Lymphatics: Left - Description - No Localized lymphadenopathy. Right - Description - No Localized lymphadenopathy.    Assessment & Plan Adin Hector MD; 06/02/2020 1:15 PM)  SPIGELIAN HERNIA (K43.9) Impression: Left lower quadrant hernia. Hard to know if this is a low Spigelian hernia versus an upper inguinal. I would do diagnostic laparoscopy with repair of hernias found. May be combined intraperitoneal and preperitoneal TAPP/TEP approach depending on what I find.  I do not think it'll be technically difficult but her obvious biggest risk is with her COPD and lungs. I need to do this under general anesthesia.  She wishes to be aggressive to nip this in the bud so she can be there to help take care of her husband. She understands she will not be independent for  several weeks and is already recruiting help from friends and family to help take care of her husband and her.  She is cleared from a pulmonary standpoint. Just saw her pulmonologist last month, some probably okay.   VENTRAL HERNIA WITHOUT OBSTRUCTION OR GANGRENE (K43.9) Impression: Slightly left infraumbilical mass suspicious for ventral hernia. We will assess with diagnostic laparoscopy and see if that needs to be repaired as well  The anatomy & physiology of the abdominal wall was discussed.  The pathophysiology of hernias was discussed.  Natural history risks without surgery including progeressive enlargement, pain, incarceration, & strangulation was discussed.   Contributors to complications such as smoking,  obesity, diabetes, prior surgery, etc were discussed.   I feel the risks of no intervention will lead to serious problems that outweigh the operative risks; therefore, I recommended surgery to reduce and repair the hernia.  I explained laparoscopic techniques with possible need for an open approach.  I noted the probable use of mesh to patch and/or buttress the hernia repair  Risks such as bleeding, infection, abscess, need for further treatment, injury to other organs, need for repair of tissues / organs, stroke, heart attack, death, and other risks were discussed.  I noted a good likelihood this will help address the problem.   Goals of post-operative recovery were discussed as well.  Possibility that this will not correct all symptoms was explained.  I stressed the importance of low-impact activity, aggressive pain control, avoiding constipation, & not pushing through pain to minimize risk of post-operative chronic pain or injury. Possibility of reherniation especially with smoking, obesity, diabetes, immunosuppression, and other health conditions was discussed.  We will work to minimize complications.     An educational handout further explaining the pathology & treatment options was  given as well.  Questions were answered.  The patient expresses understanding & wishes to proceed with surgery.      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 Has declined home oxygen. We'll double check with her pulmonologist, Dr. Chase Caller, that she is a reasonable risk for hernia repair  Current Plans I recommended obtaining preoperative pulmoanry clearance. I am concerned about the health of the patient and the ability to tolerate the operation. Therefore, we will request clearance by pulmonary better assess operative risk & see if a reevaluation, further workup, etc is needed. Also recommendations on how medications and therapies should be managed/held/restarted after surgery.  PREOP - [place] - ENCOUNTER FOR PREOPERATIVE EXAMINATION FOR GENERAL SURGICAL PROCEDURE (Z01.818)  Current Plans You are being scheduled for surgery- Our schedulers will call you.  You should hear from our office's scheduling department within 5 working days about the location, date, and time of surgery. We try to make accommodations for patient's preferences in scheduling surgery, but sometimes the OR schedule or the surgeon's schedule prevents [place] from making those accommodations.  If you have not heard from our office [phone]) in 5 working days, call the office and ask for your surgeon's nurse.  If you have other questions about your diagnosis, plan, or surgery, call the office and ask for your surgeon's nurse.  Written instructions provided [facility] Consent - Hernia Repair - Ventral/Incisional/Umbilical (Tinita Brooker): discussed with patient and provided information. Pt Education - [facility] Hernia Post-Op HCI (Sidney Silberman): discussed with patient and provided information. Pt Education - [facility] Pain Control (Desiraye Rolfson) Pt Education - Pamphlet Given - Laparoscopic Hernia Repair: discussed with patient and provided information. Pt Education - [facility] Mesh education:  discussed with patient and provided information.  Adin Hector, MD, FACS, MASCRS Gastrointestinal and Minimally Invasive Surgery  [facility] [address] [place], [place] [address] [phone] Fax [phone] Main/Paging  CONTACT INFORMATION: Weekday (9AM-5PM) concerns: Call [facility] main office at [phone] Weeknight (5PM-9AM) or Weekend/Holiday concerns: Check [email or web address] for General Surgery [facility] coverage (Please, do not use SecureChat as it is not reliable communication to operating surgeons for immediate patient care)   Adin Hector, MD, FACS, MASCRS Gastrointestinal and Minimally Invasive Surgery  [facility] [place]. [address] [place], [place] [address] [phone] Fax [phone] Main/Paging  CONTACT INFORMATION: Weekday (9AM-5PM) concerns: Call [facility] main office at [phone] Weeknight (5PM-9AM) or Weekend/Holiday concerns: Check [email or web address] for General Surgery [facility] coverage (Please, do not use SecureChat as it is not reliable communication to operating surgeons for immediate patient care)

## 2020-07-18 NOTE — Transfer of Care (Signed)
Immediate Anesthesia Transfer of Care Note  Patient: [name]  Procedure(s) Performed: LAPAROSCOPIC BILATERAL SPIGELIABN HERNIAS WITH MESH, UMBILICAL HERNIA (N/A ) HERNIA  REPAIR INGUINAL ADULT REPAIR (Left )  Patient Location: PACU  Anesthesia Type:General  Level of Consciousness: awake, alert  and oriented  Airway & Oxygen Therapy: Patient Spontanous Breathing and Patient connected to face mask oxygen  Post-op Assessment: Report given to RN and Post -op Vital signs reviewed and stable  Post vital signs: Reviewed and stable  Last Vitals:  Vitals Value Taken Time  BP    Temp    Pulse 96 07/18/20 1119  Resp 13 07/18/20 1119  SpO2 100 % 07/18/20 1119  Vitals shown include unvalidated device data.  Last Pain:  Vitals:   07/18/20 0738  TempSrc:   PainSc: 0-No pain         Complications: No complications documented.

## 2020-07-18 NOTE — Interval H&P Note (Signed)
History and Physical Interval Note:  07/18/2020 8:19 AM  [name]  has presented today for surgery, with the diagnosis of ventral wall hernia.  The various methods of treatment have been discussed with the patient and family. After consideration of risks, benefits and other options for treatment, the patient has consented to  Procedure(s): [place], (N/A) [place] (N/A) as a surgical intervention.  The patient's history has been reviewed, patient examined, no change in status, stable for surgery.  I have reviewed the patient's chart and labs.  Questions were answered to the patient's satisfaction.    I have re-reviewed the the patient's records, history, medications, and allergies.  I have re-examined the patient.  I again discussed intraoperative plans and goals of post-operative recovery.  The patient agrees to proceed.  [name]  [birth year] [id number]  Patient Care Team: Caren Macadam, MD as PCP - General (Family Medicine) Brand Males, MD (Pulmonary Disease) Grace Isaac, MD (Cardiothoracic Surgery) Lorretta Harp, MD as Consulting Physician (Cardiology) Curt Bears, MD as Consulting Physician (Hematology and Oncology)  Patient Active Problem List   Diagnosis Date Noted   Branch retinal vein occlusion with macular edema of left eye 02/25/2020   Posterior vitreous detachment of left eye 02/25/2020   Posterior vitreous detachment of right eye 02/25/2020   Gallstones 02/12/2019   Coronary artery calcification seen on CT scan 08/18/2018   Hyperlipidemia 08/18/2018   History of lung cancer 06/14/2018   Osteopenia 06/14/2018   Atherosclerosis of aorta ([place]) 05/24/2016   Moderate COPD (chronic obstructive pulmonary disease), Sees Dr. Chase Caller, [place] 05/21/2015   Hypertension 06/22/2012    Past Medical History:  Diagnosis Date   Allergic rhinitis    Anemia    Anxiety     Arthritis    all over   Bilateral cataracts    Chicken pox    Cholelithiasis 05/21/2014   On CT scan 2015    COPD (chronic obstructive pulmonary disease) (HCC)    Depression    hx ocd and anxiety in record as well   Gout    last time 6-56yrs ago    History of blood transfusion    Hot flashes    takes Prozac daily   Hx of migraines 90's   after menopause HA stopped   Hyperlipidemia    taking Flax Seed Oil and Fish Oil   Hypertension    takes Amlodipine nightly   IBS (irritable bowel syndrome)    Liver cyst    Macrocytosis    Osteopenia    takes Calcium and Vit D bid   Pneumonia    x 2 ;last time back in 1999   PONV (postoperative nausea and vomiting)    S/P right THA, AA - Dr. 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   Seasonal allergies    Smoking 09/12/2011    Past Surgical History:  Procedure Laterality Date   CARPAL TUNNEL RELEASE  80's   right    COLONOSCOPY     cyst removed  >23yrs ago   from left leg   DILATION AND CURETTAGE OF UTERUS     at age 54   EXCISIONAL TOTAL KNEE ARTHROPLASTY Right 06/03/2014   Procedure: RIGHT KNEE SCAR EXCISION;  Surgeon: [name], MD;  Location: [facility];  Service: Orthopedics;  Laterality: Right;   INGUINAL HERNIA REPAIR  2000   ROTATOR CUFF REPAIR  2001   right  TONSILLECTOMY  as a child   and adenoids   TOTAL ABDOMINAL HYSTERECTOMY  2003   benign tumor-[facility]   TOTAL HIP ARTHROPLASTY  2011   left   TOTAL HIP ARTHROPLASTY  03/07/2012   Procedure: TOTAL HIP ARTHROPLASTY ANTERIOR APPROACH;  Surgeon: [name], MD;  Location: [facility];  Service: Orthopedics;  Laterality: Right;   TOTAL KNEE ARTHROPLASTY Right 01/02/2013   Procedure: RIGHT TOTAL KNEE ARTHROPLASTY;  Surgeon: [name], MD;  Location: [facility];  Service: Orthopedics;  Laterality: Right;   TOTAL KNEE ARTHROPLASTY Left 04/03/2013   Procedure: LEFT TOTAL KNEE ARTHROPLASTY;  Surgeon: [name], MD;  Location: [facility];  Service: Orthopedics;   Laterality: Left;   VIDEO BRONCHOSCOPY  10/20/2011   Procedure: VIDEO BRONCHOSCOPY;  Surgeon: Grace Isaac, MD;  Location: [facility];  Service: Thoracic;  Laterality: N/A;   WEDGE RESECTION  10-20-2011   rt lung  - for lung cancer    Social History   Socioeconomic History   Marital status: Married    Spouse name: Not on file   Number of children: Not on file   Years of education: Not on file   Highest education level: Not on file  Occupational History   Occupation: retired  Tobacco Use   Smoking status: Former Smoker    Packs/day: 0.50    Years: 50.00    Pack years: 25.00    Types: Cigarettes    Quit date: 09/23/2011    Years since quitting: 8.8   Smokeless tobacco: Never Used  Vaping Use   Vaping Use: Never used  Substance and Sexual Activity   Alcohol use: Yes    Comment: 2 beers every night   Drug use: No   Sexual activity: Never  Other Topics Concern   Not on file  Social History Narrative   Not on file   Social Determinants of Health   Financial Resource Strain: Low Risk    Difficulty of Paying Living Expenses: Not hard at all  Food Insecurity: No Food Insecurity   Worried About Charity fundraiser in the Last Year: Never true   [place] in the Last Year: Never true  Transportation Needs: No Transportation Needs   Lack of Transportation (Medical): No   Lack of Transportation (Non-Medical): No  Physical Activity: Insufficiently Active   Days of Exercise per Week: 1 day   Minutes of Exercise per Session: 30 min  Stress: No Stress Concern Present   Feeling of Stress : Not at all  Social Connections: Moderately Isolated   Frequency of Communication with Friends and Family: More than three times a week   Frequency of Social Gatherings with Friends and Family: More than three times a week   Attends Religious Services: Never   Marine scientist or Organizations: No   Attends Music therapist: Never   Marital Status: Married  Arboriculturist Violence: Not At Risk   Fear of Current or Ex-Partner: No   Emotionally Abused: No   Physically Abused: No   Sexually Abused: No    Family History  Problem Relation Age of Onset   Other Mother        polyarteritis   Bowel Disease Father 65       colectomy/bowel obstruction issues   Heart attack Father    Hypertension Brother    Arthritis Brother    Post-traumatic stress disorder Brother    Alcohol abuse Other  Depression Other    Hearing loss Other    Heart disease Other    Hypertension Other    Anesthesia problems Neg Hx    Hypotension Neg Hx    Malignant hyperthermia Neg Hx    Pseudochol deficiency Neg Hx    Breast cancer Neg Hx     Medications Prior to Admission  Medication Sig Dispense Refill Last Dose   acetaminophen (TYLENOL) 325 MG tablet Take 325 mg by mouth every 6 (six) hours as needed for moderate pain. Alternates with Ibuprofen   07/17/2020 at Unknown time   amLODipine (NORVASC) 5 MG tablet TAKE 1 TABLET BY MOUTH AT BEDTIME (Patient taking differently: Take 5 mg by mouth daily.) 90 tablet 0 07/17/2020 at Unknown time   Budeson-Glycopyrrol-Formoterol (BREZTRI AEROSPHERE) 160-9-4.8 MCG/ACT AERO Inhale 2 puffs into the lungs 2 (two) times daily. 10.7 g 3 Past Week at Unknown time   cholecalciferol (VITAMIN D3) 25 MCG (1000 UNIT) tablet Take 1,000 Units by mouth daily.   07/17/2020 at Unknown time   FLUoxetine (PROZAC) 20 MG capsule Take 1 capsule by mouth twice daily (Patient taking differently: Take 20 mg by mouth 2 (two) times daily.) 180 capsule 0 07/18/2020 at Unknown time   PROAIR HFA 108 (90 Base) MCG/ACT inhaler INHALE 2 PUFFS BY MOUTH EVERY 6 HOURS AS NEEDED FOR WHEEZING OR SHORTNESS OF BREATH (Patient taking differently: Inhale 2 puffs into the lungs every 6 (six) hours as needed for wheezing or shortness of breath. INHALE 2 PUFFS BY MOUTH EVERY 6 HOURS AS NEEDED FOR WHEEZING OR SHORTNESS OF BREATH) 18 g 3 Past Week at Unknown time   rosuvastatin  (CRESTOR) 5 MG tablet Take 1 tablet by mouth once daily (Patient taking differently: Take 5 mg by mouth daily.) 90 tablet 0 07/17/2020 at Unknown time   vitamin B-12 (CYANOCOBALAMIN) 1000 MCG tablet Take 1,000 mcg by mouth daily.    07/17/2020 at Unknown time   vitamin E 400 UNIT capsule Take 400 Units by mouth daily.   07/17/2020 at Unknown time    Current Facility-Administered Medications  Medication Dose Route Frequency Provider Last Rate Last Admin   bupivacaine liposome (EXPAREL) 1.3 % injection 266 mg  20 mL Infiltration Once Michael Boston, MD       Chlorhexidine Gluconate Cloth 2 % PADS 6 each  6 each Topical Once Michael Boston, MD       And   Chlorhexidine Gluconate Cloth 2 % PADS 6 each  6 each Topical Once Michael Boston, MD       clindamycin (CLEOCIN) IVPB 900 mg  900 mg Intravenous On Call to OR Michael Boston, MD       And   gentamicin (GARAMYCIN) 270 mg in dextrose 5 % 100 mL IVPB  5 mg/kg Intravenous On Call to OR Michael Boston, MD       dexamethasone (DECADRON) injection 4 mg  4 mg Intravenous On Call to OR Michael Boston, MD       feeding supplement (ENSURE PRE-SURGERY) liquid 296 mL  296 mL Oral Once Michael Boston, MD       feeding supplement (ENSURE PRE-SURGERY) liquid 592 mL  592 mL Oral Once Michael Boston, MD       lactated ringers infusion   Intravenous Continuous [place], Chelsey L, MD 10 mL/hr at 07/18/20 0800 Continued from Pre-op at 07/18/20 0800   scopolamine (TRANSDERM-SCOP) 1 MG/3DAYS 1.5 mg  1 patch Transdermal On Call to OR Michael Boston, MD   1.5 mg at  07/18/20 0751     Allergies  Allergen Reactions   Clindamycin/Lincomycin Nausea And Vomiting   Penicillins Rash        Spiriva [Tiotropium Bromide Monohydrate]     General intolerance   Sulfa Antibiotics Hives and Rash    Severe Hives with skin reaction    BP (!) 132/97   Pulse 80   Temp 98.1 F (36.7 C) (Oral)   Resp 16   Wt 54.9 kg   SpO2 96%   BMI 22.13 kg/m   Labs: Results for orders placed or  performed during the hospital encounter of 07/16/20 (from the past 48 hour(s))  Basic metabolic panel per protocol     Status: Abnormal   Collection Time: 07/16/20  3:00 PM  Result Value Ref Range   Sodium 134 (L) 135 - 145 mmol/L   Potassium 4.0 3.5 - 5.1 mmol/L   Chloride 100 98 - 111 mmol/L   CO2 26 22 - 32 mmol/L   Glucose, Bld 85 70 - 99 mg/dL    Comment: Glucose reference range applies only to samples taken after fasting for at least 8 hours.   BUN 9 8 - 23 mg/dL   Creatinine, Ser 0.93 0.44 - 1.00 mg/dL   Calcium 9.6 8.9 - 10.3 mg/dL   GFR, Estimated >60 >60 mL/min    Comment: (NOTE) Calculated using the CKD-EPI Creatinine Equation (2021)    Anion gap 8 5 - 15    Comment: Performed at [facility], [place] [address], [place], [place] [address]  CBC per protocol     Status: None   Collection Time: 07/16/20  3:00 PM  Result Value Ref Range   WBC 6.0 4.0 - 10.5 K/uL   RBC 4.53 3.87 - 5.11 MIL/uL   Hemoglobin 15.0 12.0 - 15.0 g/dL   HCT 43.8 36.0 - 46.0 %   MCV 96.7 80.0 - 100.0 fL   MCH 33.1 26.0 - 34.0 pg   MCHC 34.2 30.0 - 36.0 g/dL   RDW 12.0 11.5 - 15.5 %   Platelets 200 150 - 400 K/uL   nRBC 0.0 0.0 - 0.2 %    Comment: Performed at [facility], [place] [address] [place], [place] [address]    Imaging / Studies: CT Head Wo Contrast  Result Date: 07/08/2020 CLINICAL DATA:  Laceration.  Blunt trauma EXAM: CT HEAD WITHOUT CONTRAST TECHNIQUE: Contiguous axial images were obtained from the base of the skull through the vertex without intravenous contrast. COMPARISON:  Head CT 06/19/2019 FINDINGS: Brain: No acute intracranial hemorrhage. No focal mass lesion. No CT evidence of acute infarction. No midline shift or mass effect. No hydrocephalus. Basilar cisterns are patent. There are periventricular and subcortical white matter hypodensities. Generalized cortical atrophy. Vascular: No hyperdense vessel or unexpected calcification. Skull: Normal.  Negative for fracture or focal lesion. Sinuses/Orbits: Paranasal sinuses and mastoid air cells are clear. Orbits are clear. Other: None. IMPRESSION: 1. No acute intracranial findings.  No evidence trauma. 2. Atrophy and white matter microvascular disease. Electronically Signed   By: Suzy Bouchard M.D.   On: 07/08/2020 12:06     .Adin Hector, M.D., F.A.C.S. Gastrointestinal and Minimally Invasive Surgery [facility] [address], [place], [place] [address] [phone] Main / Paging  07/18/2020 8:19 AM     Adin Hector

## 2020-07-18 NOTE — Progress Notes (Signed)
Note: Portions of this report may have been transcribed using voice recognition software. A sincere effort was made to ensure accuracy; however, inadvertent computerized transcription errors may be present.   Any transcriptional errors that result from this process are unintentional.        [name]  1940/10/11 [id number]  Patient Care Team: Caren Macadam, MD as PCP - General (Family Medicine) Brand Males, MD (Pulmonary Disease) Grace Isaac, MD (Cardiothoracic Surgery) Lorretta Harp, MD as Consulting Physician (Cardiology) Curt Bears, MD as Consulting Physician (Hematology and Oncology) Michael Boston, MD as Consulting Physician (General Surgery)  Patient recovery room talking feeling well.  Anesthesia wished to try nebulizer treatments since patient really wishes to go home.  As long as her oxygenation on room air is >88 which is her baseline home, I am okay with it.  Otherwise, low threshold to resume original plan of keeping overnight with hospital medicine/pulmonary consultation if further issues tomorrow.  Patient Active Problem List   Diagnosis Date Noted  . 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 Left inguinal hernia s/p lap repair w mesh 07/18/2020 07/18/2020  . 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 Posterior vitreous detachment of left eye 02/25/2020  . Posterior vitreous detachment of right eye 02/25/2020  . Gallstones 02/12/2019  . Coronary artery calcification seen on CT scan 08/18/2018  . Hyperlipidemia 08/18/2018  . History of lung cancer 06/14/2018  . Osteopenia 06/14/2018  . Atherosclerosis of aorta ([place]) 05/24/2016  . Moderate COPD (chronic obstructive pulmonary disease), Sees Dr. Chase Caller, [facility] 05/21/2015  . Hypertension 06/22/2012    Past Medical History:  Diagnosis Date  . Allergic rhinitis   . Anemia   . Anxiety   . Arthritis    all over  . Bilateral  cataracts   . Chicken pox   . Cholelithiasis 05/21/2014   On CT scan 2015   . COPD (chronic obstructive pulmonary disease) ([place])   . Depression    hx ocd and anxiety in record as well  . Gout    last time 6-84yrs ago   . History of blood transfusion   . Hot flashes    takes Prozac daily  . Hx of migraines 90's   after menopause HA stopped  . Hyperlipidemia    taking Flax Seed Oil and Fish Oil  . Hypertension    takes Amlodipine nightly  . IBS (irritable bowel syndrome)   . Liver cyst   . Macrocytosis   . Osteopenia    takes Calcium and Vit D bid  . Pneumonia    x 2 ;last time back in 1999  . PONV (postoperative nausea and vomiting)   . 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 Seasonal allergies   . Smoking 09/12/2011    Past Surgical History:  Procedure Laterality Date  . CARPAL TUNNEL RELEASE  80's   right   . COLONOSCOPY    . cyst removed  >45yrs ago   from left leg  . DILATION AND CURETTAGE OF UTERUS     at age 79  . EXCISIONAL TOTAL KNEE ARTHROPLASTY Right 06/03/2014   Procedure: RIGHT KNEE SCAR EXCISION;  Surgeon: [name], MD;  Location: [facility];  Service: Orthopedics;  Laterality: Right;  . INGUINAL HERNIA REPAIR  2000  . ROTATOR CUFF REPAIR  2001   right  . TONSILLECTOMY  as a  child   and adenoids  . TOTAL ABDOMINAL HYSTERECTOMY  2003   benign tumor-[facility]  . TOTAL HIP ARTHROPLASTY  2011   left  . TOTAL HIP ARTHROPLASTY  03/07/2012   Procedure: TOTAL HIP ARTHROPLASTY ANTERIOR APPROACH;  Surgeon: [name], MD;  Location: [facility];  Service: Orthopedics;  Laterality: Right;  . TOTAL KNEE ARTHROPLASTY Right 01/02/2013   Procedure: RIGHT TOTAL KNEE ARTHROPLASTY;  Surgeon: [name], MD;  Location: [facility];  Service: Orthopedics;  Laterality: Right;  . TOTAL KNEE ARTHROPLASTY Left 04/03/2013   Procedure: LEFT TOTAL KNEE ARTHROPLASTY;  Surgeon: [name], MD;  [facility]: [facility];  Service: Orthopedics;   Laterality: Left;  Marland [place] VIDEO BRONCHOSCOPY  10/20/2011   Procedure: VIDEO BRONCHOSCOPY;  Surgeon: Grace Isaac, MD;  Location: [place];  Service: Thoracic;  Laterality: N/A;  . WEDGE RESECTION  10-20-2011   rt lung  - for lung cancer    Social History   Socioeconomic History  . Marital status: Married    Spouse name: Not on file  . Number of children: Not on file  . Years of education: Not on file  . Highest education level: Not on file  Occupational History  . Occupation: retired  Tobacco Use  . Smoking status: Former Smoker    Packs/day: 0.50    Years: 50.00    Pack years: 25.00    Types: Cigarettes    Quit date: 09/23/2011    Years since quitting: 8.8  . Smokeless tobacco: Never Used  Vaping Use  . Vaping Use: Never used  Substance and Sexual Activity  . Alcohol use: Yes    Comment: 2 beers every night  . Drug use: No  . Sexual activity: Never  Other Topics Concern  . Not on file  Social History Narrative  . Not on file   Social Determinants of Health   Financial Resource Strain: Low Risk   . Difficulty of Paying Living Expenses: Not hard at all  Food Insecurity: No Food Insecurity  . Worried About Charity fundraiser in the Last Year: Never true  . Ran Out of Food in the Last Year: Never true  Transportation Needs: No Transportation Needs  . Lack of Transportation (Medical): No  . Lack of Transportation (Non-Medical): No  Physical Activity: Insufficiently Active  . Days of Exercise per Week: 1 day  . Minutes of Exercise per Session: 30 min  Stress: No Stress Concern Present  . Feeling of Stress : Not at all  Social Connections: Moderately Isolated  . Frequency of Communication with Friends and Family: More than three times a week  . Frequency of Social Gatherings with Friends and Family: More than three times a week  . Attends Religious Services: Never  . Active Member of Clubs or Organizations: No  . Attends Archivist Meetings: Never  . Marital  Status: Married  Human resources officer Violence: Not At Risk  . Fear of Current or Ex-Partner: No  . Emotionally Abused: No  . Physically Abused: No  . Sexually Abused: No    Family History  Problem Relation Age of Onset  . Other Mother        polyarteritis  . Bowel Disease Father 46       colectomy/bowel obstruction issues  . Heart attack Father   . Hypertension Brother   . Arthritis Brother   . Post-traumatic stress disorder Brother   . Alcohol abuse Other   . Depression Other   .  Hearing loss Other   . Heart disease Other   . Hypertension Other   . Anesthesia problems Neg Hx   . Hypotension Neg Hx   . Malignant hyperthermia Neg Hx   . Pseudochol deficiency Neg Hx   . Breast cancer Neg Hx     Current Facility-Administered Medications  Medication Dose Route Frequency Provider Last Rate Last Admin  . 0.9 % irrigation (POUR BTL)    PRN Michael Boston, MD   1,000 mL at 07/18/20 0910  . acetaminophen (OFIRMEV) IV 1,000 mg  1,000 mg Intravenous Once PRN Barnet Glasgow, MD      . albuterol (PROVENTIL) (2.5 MG/3ML) 0.083% nebulizer solution 2.5 mg  2.5 mg Nebulization Q6H PRN Barnet Glasgow, MD   2.5 mg at 07/18/20 1240  . albuterol (PROVENTIL) (2.5 MG/3ML) 0.083% nebulizer solution           . bupivacaine liposome (EXPAREL) 1.3 % injection 266 mg  20 mL Infiltration Once Michael Boston, MD      . bupivacaine liposome (EXPAREL) 1.3 % injection    PRN Michael Boston, MD   20 mL at 07/18/20 1052  . bupivacaine-EPINEPHrine (MARCAINE W/ EPI) 0.25% -1:200000 (with pres) injection    PRN Michael Boston, MD   50 mL at 07/18/20 1052  . Chlorhexidine Gluconate Cloth 2 % PADS 6 each  6 each Topical Once Michael Boston, MD       And  . Chlorhexidine Gluconate Cloth 2 % PADS 6 each  6 each Topical Once Michael Boston, MD      . dexamethasone (DECADRON) injection 4 mg  4 mg Intravenous On Call to OR Michael Boston, MD      . feeding supplement (ENSURE PRE-SURGERY) liquid 296 mL  296 mL Oral Once  Michael Boston, MD      . feeding supplement (ENSURE PRE-SURGERY) liquid 592 mL  592 mL Oral Once Michael Boston, MD      . fentaNYL (SUBLIMAZE) injection 25-50 mcg  25-50 mcg Intravenous Q5 min PRN Barnet Glasgow, MD      . ipratropium-albuterol (DUONEB) 0.5-2.5 (3) MG/3ML nebulizer solution 3 mL  3 mL Nebulization Q4H Barnet Glasgow, MD      . lactated ringers infusion   Intravenous Continuous Freddrick March, MD 10 mL/hr at 07/18/20 3976 Continued from Pre-op at 07/18/20 0837  . ondansetron (ZOFRAN) injection 4 mg  4 mg Intravenous Once PRN Barnet Glasgow, MD      . scopolamine (TRANSDERM-SCOP) 1 MG/3DAYS 1.5 mg  1 patch Transdermal On Call to OR Michael Boston, MD   1.5 mg at 07/18/20 0751  . sterile water for irrigation for irrigation    PRN Michael Boston, MD   2,000 mL at 07/18/20 0910     Allergies  Allergen Reactions  . Clindamycin/Lincomycin Nausea And Vomiting  . Penicillins Rash       . Spiriva [Tiotropium Bromide Monohydrate]     General intolerance  . Sulfa Antibiotics Hives and Rash    Severe Hives with skin reaction    BP 105/78 (BP Location: Left Arm)   Pulse 78   Temp 97.8 F (36.6 C)   Resp 10   Wt 54.9 kg   SpO2 94%   BMI 22.13 kg/m   CT Head Wo Contrast  Result Date: 07/08/2020 CLINICAL DATA:  Laceration.  Blunt trauma EXAM: CT HEAD WITHOUT CONTRAST TECHNIQUE: Contiguous axial images were obtained from the base of the skull through the vertex without intravenous  contrast. COMPARISON:  Head CT 06/19/2019 FINDINGS: Brain: No acute intracranial hemorrhage. No focal mass lesion. No CT evidence of acute infarction. No midline shift or mass effect. No hydrocephalus. Basilar cisterns are patent. There are periventricular and subcortical white matter hypodensities. Generalized cortical atrophy. Vascular: No hyperdense vessel or unexpected calcification. Skull: Normal. Negative for fracture or focal lesion. Sinuses/Orbits: Paranasal sinuses and mastoid air cells  are clear. Orbits are clear. Other: None. IMPRESSION: 1. No acute intracranial findings.  No evidence trauma. 2. Atrophy and white matter microvascular disease. Electronically Signed   By: Suzy Bouchard M.D.   On: 07/08/2020 12:06

## 2020-07-18 NOTE — OR Nursing (Signed)
Patient has had some wheezing with low oxygen saturations, had an albuterol treatment early, Dr. Valma Cava cleared patient to go home after duoneb

## 2020-07-18 NOTE — Anesthesia Postprocedure Evaluation (Signed)
Anesthesia Post Note  Patient: Shweta Aman  Procedure(s) Performed: LAPAROSCOPIC BILATERAL SPIGELIABN HERNIAS WITH MESH, UMBILICAL HERNIA (N/A ) HERNIA  REPAIR INGUINAL ADULT REPAIR (Left )     Patient location during evaluation: PACU Anesthesia Type: General Level of consciousness: awake and alert Pain management: pain level controlled Vital Signs Assessment: post-procedure vital signs reviewed and stable Respiratory status: spontaneous breathing, nonlabored ventilation, respiratory function stable and patient connected to nasal cannula oxygen Cardiovascular status: blood pressure returned to baseline and stable Postop Assessment: no apparent nausea or vomiting Anesthetic complications: no   No complications documented.  Last Vitals:  Vitals:   07/18/20 1203 07/18/20 1233  BP: 119/84 121/78  Pulse: 95 86  Resp: 15 10  Temp:    SpO2: 91% 91%    Last Pain:  Vitals:   07/18/20 1203  TempSrc:   PainSc: 0-No pain                 Barnet Glasgow

## 2020-07-18 NOTE — Discharge Instructions (Signed)
HERNIA REPAIR: POST OP INSTRUCTIONS ° °###################################################################### ° °EAT °Gradually transition to a high fiber diet with a fiber supplement over the next few weeks after discharge.  Start with a pureed / full liquid diet (see below) ° °WALK °Walk an hour a day.  Control your pain to do that.   ° °CONTROL PAIN °Control pain so that you can walk, sleep, tolerate sneezing/coughing, and go up/down stairs. ° °HAVE A BOWEL MOVEMENT DAILY °Keep your bowels regular to avoid problems.  OK to try a laxative to override constipation.  OK to use an antidairrheal to slow down diarrhea.  Call if not better after 2 tries ° °CALL IF YOU HAVE PROBLEMS/CONCERNS °Call if you are still struggling despite following these instructions. °Call if you have concerns not answered by these instructions ° °###################################################################### ° ° ° °1. DIET: Follow a light bland diet & liquids the first 24 hours after arrival home, such as soup, liquids, starches, etc.  Be sure to drink plenty of fluids.  Quickly advance to a usual solid diet within a few days.  Avoid fast food or heavy meals as your are more likely to get nauseated or have irregular bowels.  A low-fat, high-fiber diet for the rest of your life is ideal.  ° °2. Take your usually prescribed home medications unless otherwise directed. ° °3. PAIN CONTROL: °a. Pain is best controlled by a usual combination of three different methods TOGETHER: °i. Ice/Heat °ii. Over the counter pain medication °iii. Prescription pain medication °b. Most patients will experience some swelling and bruising around the hernia(s) such as the bellybutton, groins, or old incisions.  Ice packs or heating pads (30-60 minutes up to 6 times a day) will help. Use ice for the first few days to help decrease swelling and bruising, then switch to heat to help relax tight/sore spots and speed recovery.  Some people prefer to use ice  alone, heat alone, alternating between ice & heat.  Experiment to what works for you.  Swelling and bruising can take several weeks to resolve.   °c. It is helpful to take an over-the-counter pain medication regularly for the first few weeks.  Choose one of the following that works best for you: °i. Naproxen (Aleve, etc)  Two 220mg tabs twice a day °ii. Ibuprofen (Advil, etc) Three 200mg tabs four times a day (every meal & bedtime) °iii. Acetaminophen (Tylenol, etc) 325-650mg four times a day (every meal & bedtime) °d. A  prescription for pain medication should be given to you upon discharge.  Take your pain medication as prescribed.  °i. If you are having problems/concerns with the prescription medicine (does not control pain, nausea, vomiting, rash, itching, etc), please call us [phone] to see if we need to switch you to a different pain medicine that will work better for you and/or control your side effect better. °ii. If you need a refill on your pain medication, please contact your pharmacy.  They will contact our office to request authorization. Prescriptions will not be filled after 5 pm or on week-ends. ° °4. Avoid getting constipated.  Between the surgery and the pain medications, it is common to experience some constipation.  Increasing fluid intake and taking a fiber supplement (such as Metamucil, Citrucel, FiberCon, MiraLax, etc) 1-2 times a day regularly will usually help prevent this problem from occurring.  A mild laxative (prune juice, Milk of Magnesia, MiraLax, etc) should be taken according to package directions if there are no bowel movements after 48   hours.   ° °5. Wash / shower every day.  You may shower over the dressings as they are waterproof.   ° °6. Remove your waterproof bandages, skin tapes, and other bandages 3 days after surgery. You may replace a dressing/Band-Aid to cover the incision for comfort if you wish. You may leave the incisions open to air.  You may replace a  dressing/Band-Aid to cover an incision for comfort if you wish.  Continue to shower over incision(s) after the dressing is off. ° °7. ACTIVITIES as tolerated:   °a. You may resume regular (light) daily activities beginning the next day--such as daily self-care, walking, climbing stairs--gradually increasing activities as tolerated.  Control your pain so that you can walk an hour a day.  If you can walk 30 minutes without difficulty, it is safe to try more intense activity such as jogging, treadmill, bicycling, low-impact aerobics, swimming, etc. °b. Save the most intensive and strenuous activity for last such as sit-ups, heavy lifting, contact sports, etc  Refrain from any heavy lifting or straining until you are off narcotics for pain control.   °c. DO NOT PUSH THROUGH PAIN.  Let pain be your guide: If it hurts to do something, don't do it.  Pain is your body warning you to avoid that activity for another week until the pain goes down. °d. You may drive when you are no longer taking prescription pain medication, you can comfortably wear a seatbelt, and you can safely maneuver your car and apply brakes. °e. You may have sexual intercourse when it is comfortable.  ° °8. FOLLOW UP in our office °a. Please call [facility] at [phone] to set up an appointment to see your surgeon in the office for a follow-up appointment approximately 2-3 weeks after your surgery. °b. Make sure that you call for this appointment the day you arrive home to insure a convenient appointment time. ° °9.  If you have disability of FMLA / Family leave forms, please bring the forms to the office for processing.  (do not give to your surgeon). ° °WHEN TO CALL US [phone]: °1. Poor pain control °2. Reactions / problems with new medications (rash/itching, nausea, etc)  °3. Fever over 101.5 F (38.5 C) °4. Inability to urinate °5. Nausea and/or vomiting °6. Worsening swelling or bruising °7. Continued bleeding from incision. °8. Increased pain,  redness, or drainage from the incision ° ° The clinic staff is available to answer your questions during regular business hours (8:30am-5pm).  Please don’t hesitate to call and ask to speak to one of our nurses for clinical concerns.  ° If you have a medical emergency, go to the nearest emergency room or call 911. ° A surgeon from [facility] is always on call at the hospitals in [place] ° °[facility] °[address], [address], [place], [place]  [address] ? ° [address] [address], [place], [place]   [address] °MAIN: [phone] ? TOLL FREE: [phone] ? FAX: [phone] °[email or web address] ° °

## 2020-07-18 NOTE — OR Nursing (Signed)
Patient is resting comfortable still 84% on room air Dr. Johney Maine and Dr. Valma Cava have decided to keep patient overnight.  Family will be notified

## 2020-07-18 NOTE — Op Note (Signed)
07/18/2020  11:40 AM  PATIENT:  [name]  79 y.o. female  Patient Care Team: Caren Macadam, MD as PCP - General (Family Medicine) Brand Males, MD (Pulmonary Disease) Grace Isaac, MD (Cardiothoracic Surgery) Lorretta Harp, MD as Consulting Physician (Cardiology) Curt Bears, MD as Consulting Physician (Hematology and Oncology) Michael Boston, MD as Consulting Physician (General Surgery)  PRE-OPERATIVE DIAGNOSIS:  LEFT INGUINAL HERNIA  LEFT SPIGELIAN HERNIA UMBILICAL HERNIA  POST-OPERATIVE DIAGNOSIS:   LEFT INGUINAL HERNIA  BILATERAL SPIGELIAN HERNIAS (Left incarcerated) UMBILICAL HERNIA  PROCEDURE:   LAPAROSCOPIC LEFT INGUINAL HERNIA REPAIR WITH MESH LAPAROSCOPIC BILATERAL SPIGELIAN HERNIAS WITH MESH PRIMARY UMBILICAL HERNIA REPAIR  SURGEON:  Adin Hector, MD  ASSISTANT: None  ANESTHESIA:     Regional ilioinguinal and genitofemoral and spermatic cord nerve blocks  General  Nerve block provided with liposomal bupivacaine (Experel) mixed with 0.25% bupivacaine as a Bilateral TAP block x 12mL each side at the level of the transverse abdominis & preperitoneal spaces along the flank at the anterior axillary line, from subcostal ridge to iliac crest under laparoscopic guidance    EBL:  Total I/O In: 956.8 [I.V.:800; IV Piggyback:156.8] Out: 25 [Blood:25].  See anesthesia record  Delay start of Pharmacological VTE agent (>24hrs) due to surgical blood loss or risk of bleeding:  no  DRAINS: NONE  SPECIMEN:  NONE  DISPOSITION OF SPECIMEN:  N/A  COUNTS:  YES  PLAN OF CARE: Discharge to home after PACU  PATIENT DISPOSITION:  PACU - hemodynamically stable.  INDICATION: Pleasant woman with left sided abdominal swelling & possible Spigelian hernia.  Definite left inguinal & umbilical hernia.  I recommended laparoscopic exploration & repair of hernias found  The anatomy & physiology of the abdominal wall and pelvic floor was  discussed.  The pathophysiology of hernias in the inguinal and pelvic region was discussed.  Natural history risks such as progressive enlargement, pain, incarceration & strangulation was discussed.   Contributors to complications such as smoking, obesity, diabetes, prior surgery, etc were discussed.    I feel the risks of no intervention will lead to serious problems that outweigh the operative risks; therefore, I recommended surgery to reduce and repair the hernia.  I explained laparoscopic techniques with possible need for an open approach.  I noted usual use of mesh to patch and/or buttress hernia repair  Risks such as bleeding, infection, abscess, need for further treatment, heart attack, death, and other risks were discussed.  I noted a good likelihood this will help address the problem.   Goals of post-operative recovery were discussed as well.  Possibility that this will not correct all symptoms was explained.  I stressed the importance of low-impact activity, aggressive pain control, avoiding constipation, & not pushing through pain to minimize risk of post-operative chronic pain or injury. Possibility of reherniation was discussed.  We will work to minimize complications.     An educational handout further explaining the pathology & treatment options was given as well.  Questions were answered.  The patient expresses understanding & wishes to proceed with surgery.  OR FINDINGS: Bilateral spigelian hernias.  Left side incarcerated with preperitoneal fat and some omentum.  Obvious indirect left inguinal hernia of moderate size.  No definite recurrent right inguinal hernia.  Umbilical hernia  DESCRIPTION:  Informed consent was confirmed.  The patient underwent general anaesthesia without difficulty.  The patient was positioned appropriately.  VTE prevention in place.  The patient's abdomen was clipped, prepped, & draped in a sterile  fashion.  Surgical timeout confirmed our plan.  Peritoneal  entry with a laparoscopic port was obtained using optical entry technique in the left upper abdomen as the patient was positioned in reverse Trendelenburg.  Entry was clean.  I induced carbon dioxide insufflation.  Camera inspection revealed no injury.  Extra ports were carefully placed under direct laparoscopic visualization.  The obvious left indirect inguinal hernia.  She had a more obvious right spiculated hernia.  Small but definite umbilical hernia.  Moderate diastases recti.  No lower abdominal or incisional hernias.  Some dimpling suspicious for a left-sided spigelian hernia as well which was her main complaint.  I decided to do a TAPP approach.  I scored the peritoneum transversely from right flank to left flank laterally just above the umbilical hernia.  I focused attention on the LEFT pelvis since that was the dominant hernia side.   I used blunt & focused sharp dissection to free the peritoneum off the flank and down to the pubic rim.  I freed the anteriolateral bladder wall off the anteriolateral pelvic wall, sparing midline attachments.   Doing this I reduced an obvious left sinus began hernia with a moderate volume of preperitoneal fat.  This seemed to correlate where she had any symptoms or complaints primarily.  I located a swath of peritoneum going into a hernia fascial defect at the  internal ring consistent with  an indirect inguinal hernia.. Who is actually larger than the Spigelian hernia.  I gradually freed the peritoneal hernia sac off safely and reduced it into the preperitoneal space.  I freed the peritoneum off the round ligament.  I freed peritoneum off the retroperitoneum along the psoas muscle.  She had already had a hysterectomy, so I came through the round ligament after skeletonizing it to have a large clear plane.  She did have some scars and adhesions there.  I repaired the peritoneum using 2-0 Vicryl using laparoscopic intracorporeal suturing.  This allowed me to do a high  ligation of the peritoneal hernia sac as well.   I checked & assured hemostasis.     I turned attention on the opposite  RIGHT pelvis.  I did dissection in a similar, mirror-image fashion. The patient had Spigelian hernia.  No evidence of any recurrent inguinal hernia.  Some laxity in the femoral canal but no major hernia there.  No major obturator hernia.    I checked & assured hemostasis.    I chose 15x15 cm sheets of mesh for each side.  I used standard weight Marlex mesh on the left side.  Ultra-lightweight polypropylene mesh (Ultrapro), one for right side.  I cut a single sigmoid-shaped slit ~6cm from a corner of each mesh.  I placed the meshes umbilical hernia under laparoscopic view into the preperitoneal space & laid them as overlapping diamonds such that at the inferior points, a 6x6 cm corner flap rested in the true anterolateral pelvis, covering the obturator & femoral foramina.   I allowed the bladder to return to the pubis, this helping tuck the corners of the mesh in the anteriolateral pelvis.  The medial corners overlapped each other across midline cephalad to the pubic rim.   Given the numerous hernias of moderate size, I placed a third 15x15cm Ultrapro mesh in the center as a vertical diamond.  The lateral wings of the mesh overlap across the direct spaces and internal rings where the dominant hernias were.  This provided good coverage and reinforcement of the hernia repairs.  Did  place a few tacks superiorly and laterally along the pubic rim to help hold the mesh in place with good overlap.  Would wear off and was lateral to the spiculated hernias.  Using absorbable secure strap tacker to tack the lower peritoneum back to the mid anterior abdominal wall company of all the mesh.  A superior corner of mesh was exposed in the left paramedian region but I covered with a patch of greater omentum to cover that area as well with the tacker.  I evacuated carbon dioxide.  I primarily closed the  umbilical hernia using #1 PDS interrupted sutures.  Was a 15 mm hernia sided primary repair only.  I had to remove the umbilical stalk off and trim that down.  I retacked the skin to create a new umbilicus using 0 Vicryl suture.  We did laparoscopic reexploration to confirm no injury or travel of the bowel at the umbilical area and good hemostasis overall.  Evacuated carbon oxide remove the ports.  I closed the skin using 4-0 monocryl stitch.  Sterile dressings were applied.   The patient was extubated & arrived in the PACU in stable condition..  I had discussed postoperative care with the patient in the holding area.  Instructions are written in the chart.  I discussed operative findings, updated the patient's status, discussed probable steps to recovery, and gave postoperative recommendations to the patient's friend, [name] Semore..  Recommendations were made.  Questions were answered.  She expressed understanding & appreciation.   Adin Hector, M.D., F.A.C.S. Gastrointestinal and Minimally Invasive Surgery [facility] [address], [place], [place] [address] [phone] Main / Paging  07/18/2020 11:40 AM

## 2020-07-19 ENCOUNTER — Ambulatory Visit (HOSPITAL_COMMUNITY): Payer: Medicare Other

## 2020-07-19 DIAGNOSIS — K4090 Unilateral inguinal hernia, without obstruction or gangrene, not specified as recurrent: Secondary | ICD-10-CM

## 2020-07-19 DIAGNOSIS — J441 Chronic obstructive pulmonary disease with (acute) exacerbation: Secondary | ICD-10-CM

## 2020-07-19 DIAGNOSIS — K439 Ventral hernia without obstruction or gangrene: Secondary | ICD-10-CM

## 2020-07-19 DIAGNOSIS — J9811 Atelectasis: Secondary | ICD-10-CM

## 2020-07-19 DIAGNOSIS — J9621 Acute and chronic respiratory failure with hypoxia: Secondary | ICD-10-CM

## 2020-07-19 DIAGNOSIS — R918 Other nonspecific abnormal finding of lung field: Secondary | ICD-10-CM | POA: Diagnosis not present

## 2020-07-19 LAB — BASIC METABOLIC PANEL
Anion gap: 10 (ref 5–15)
BUN: 14 mg/dL (ref 8–23)
CO2: 23 mmol/L (ref 22–32)
Calcium: 8.7 mg/dL — ABNORMAL LOW (ref 8.9–10.3)
Chloride: 100 mmol/L (ref 98–111)
Creatinine, Ser: 1.06 mg/dL — ABNORMAL HIGH (ref 0.44–1.00)
GFR, Estimated: 54 mL/min — ABNORMAL LOW (ref >60)
Glucose, Bld: 96 mg/dL (ref 70–99)
Potassium: 4.5 mmol/L (ref 3.5–5.1)
Sodium: 133 mmol/L — ABNORMAL LOW (ref 135–145)

## 2020-07-19 LAB — CBC
HCT: 37.1 % (ref 36.0–46.0)
Hemoglobin: 12.4 g/dL (ref 12.0–15.0)
MCH: 32.9 pg (ref 26.0–34.0)
MCHC: 33.4 g/dL (ref 30.0–36.0)
MCV: 98.4 fL (ref 80.0–100.0)
Platelets: 156 10*3/uL (ref 150–400)
RBC: 3.77 MIL/uL — ABNORMAL LOW (ref 3.87–5.11)
RDW: 12.4 % (ref 11.5–15.5)
WBC: 8.6 10*3/uL (ref 4.0–10.5)
nRBC: 0 % (ref 0.0–0.2)

## 2020-07-19 LAB — MAGNESIUM: Magnesium: 2.1 mg/dL (ref 1.7–2.4)

## 2020-07-19 MED ORDER — ALBUTEROL SULFATE (2.5 MG/3ML) 0.083% IN NEBU
INHALATION_SOLUTION | RESPIRATORY_TRACT | Status: AC
  Administered 2020-07-19: 2.5 mg via RESPIRATORY_TRACT
  Filled 2020-07-19: qty 3

## 2020-07-19 MED ORDER — GUAIFENESIN-DM 100-10 MG/5ML PO SYRP
5.0000 mL | ORAL_SOLUTION | ORAL | Status: DC | PRN
  Administered 2020-07-24: 5 mL via ORAL
  Filled 2020-07-19: qty 10

## 2020-07-19 MED ORDER — ALBUTEROL SULFATE (2.5 MG/3ML) 0.083% IN NEBU
2.5000 mg | INHALATION_SOLUTION | RESPIRATORY_TRACT | Status: DC
  Administered 2020-07-19: 2.5 mg via RESPIRATORY_TRACT
  Filled 2020-07-19: qty 3

## 2020-07-19 MED ORDER — IPRATROPIUM-ALBUTEROL 0.5-2.5 (3) MG/3ML IN SOLN
3.0000 mL | Freq: Three times a day (TID) | RESPIRATORY_TRACT | Status: DC
  Administered 2020-07-19 – 2020-07-20 (×4): 3 mL via RESPIRATORY_TRACT
  Filled 2020-07-19 (×4): qty 3

## 2020-07-19 MED ORDER — ALBUTEROL SULFATE (2.5 MG/3ML) 0.083% IN NEBU: 2.5000 mg | INHALATION_SOLUTION | RESPIRATORY_TRACT | Status: DC | PRN

## 2020-07-19 MED ORDER — FUROSEMIDE 20 MG PO TABS
10.0000 mg | ORAL_TABLET | Freq: Once | ORAL | Status: AC
  Administered 2020-07-19: 10 mg via ORAL
  Filled 2020-07-19: qty 1

## 2020-07-19 MED ORDER — ALBUTEROL SULFATE (2.5 MG/3ML) 0.083% IN NEBU
2.5000 mg | INHALATION_SOLUTION | RESPIRATORY_TRACT | Status: DC | PRN
  Administered 2020-07-22: 2.5 mg via RESPIRATORY_TRACT
  Filled 2020-07-19: qty 3

## 2020-07-19 NOTE — Consult Note (Signed)
History and Physical   [name] QVZ:[id number] DOB: Jun 09, 1941 DOA: 07/18/2020  Referring MD/NP/PA: Dr. Rolm Bookbinder PCP: Caren Macadam, MD Outpatient Specialists: Pulmonary, Dr. Chase Caller   Reason for consult: Wheezing, hypoxia postoperatively  HPI: [name] is a 79 y.o. female with a history of COPD, stage I lung CA s/p lobectomy, HTN, HLD, depression who underwent hernia repair 07/18/2020 and later developed wheezing and hypoxia on POD #1 delaying discharge for which medicine is consulted.  The patient reports her wheezing was constant and moderate for her, came on gradually, was significantly improved with albuterol breathing treatments at 11am and ~4pm. She wishes to discharge as soon as possible. Says she has IS and oxygen at home that she doesn't regularly use, partly because the concentrator is too loud.   Carries diagnosis of moderate COPD by PFTs Feb 2017 (FEV 1 56%, FVC 87%, ratio 49, DLCO 43%. Moderate emphysema on imaging. Uses albuterol ~2 times per week. Has received covid vaccinations (x3) and flu vaccination this year. Has no sick contacts. Does have history of Pneumococcal pneumonia not requiring hospitalization years ago.  Review of Systems: Denies fever, chills, headache, cough, sore throat, chest pain, palpitations, shortness of breath, abdominal pain, nausea, vomiting, changes in bowel habits, blood in stool, change in bladder habits, myalgias, arthralgias, rash, and per HPI. All others reviewed and are negative.   Past Medical History:  Diagnosis Date  . Allergic rhinitis   . Anemia   . Anxiety   . Arthritis    all over  . Bilateral cataracts   . Chicken pox   . Cholelithiasis 05/21/2014   On CT scan 2015   . COPD (chronic obstructive pulmonary disease) ([place])   . Depression    hx ocd and anxiety in record as well  . Gout    last time 6-87yrs ago   . History of blood transfusion   . Hot flashes    takes Prozac daily  . Hx  of migraines 90's   after menopause HA stopped  . Hyperlipidemia    taking Flax Seed Oil and Fish Oil  . Hypertension    takes Amlodipine nightly  . IBS (irritable bowel syndrome)   . Liver cyst   . Macrocytosis   . Osteopenia    takes Calcium and Vit D bid  . Pneumonia    x 2 ;last time back in 1999  . PONV (postoperative nausea and vomiting)   . 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 Seasonal allergies   . Smoking 09/12/2011   Past Surgical History:  Procedure Laterality Date  . CARPAL TUNNEL RELEASE  80's   right   . COLONOSCOPY    . cyst removed  >56yrs ago   from left leg  . DILATION AND CURETTAGE OF UTERUS     at age 85  . EXCISIONAL TOTAL KNEE ARTHROPLASTY Right 06/03/2014   Procedure: RIGHT KNEE SCAR EXCISION;  Surgeon: [name], MD;  Location: [facility];  Service: Orthopedics;  Laterality: Right;  . INGUINAL HERNIA REPAIR  2000  . ROTATOR CUFF REPAIR  2001   right  . TONSILLECTOMY  as a child   and adenoids  . TOTAL ABDOMINAL HYSTERECTOMY  2003   benign tumor-[facility]  . TOTAL HIP ARTHROPLASTY  2011   left  . TOTAL HIP ARTHROPLASTY  03/07/2012   Procedure: TOTAL HIP ARTHROPLASTY ANTERIOR APPROACH;  Surgeon: [name], MD;  Location:  [facility];  Service: Orthopedics;  Laterality: Right;  . TOTAL KNEE ARTHROPLASTY Right 01/02/2013   Procedure: RIGHT TOTAL KNEE ARTHROPLASTY;  Surgeon: [name], MD;  Location: [facility];  Service: Orthopedics;  Laterality: Right;  . TOTAL KNEE ARTHROPLASTY Left 04/03/2013   Procedure: LEFT TOTAL KNEE ARTHROPLASTY;  Surgeon: [name], MD;  Location: [facility];  Service: Orthopedics;  Laterality: Left;  Marland Kitchen VIDEO BRONCHOSCOPY  10/20/2011   Procedure: VIDEO BRONCHOSCOPY;  Surgeon: Grace Isaac, MD;  Location: [facility] OR;  Service: Thoracic;  Laterality: N/A;  . 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     reports that she quit smoking about 8 years ago. Her smoking use included cigarettes. She has a 25.00 pack-year smoking history. She has never used smokeless tobacco. She reports current alcohol use. She reports that she does not use drugs. Allergies  Allergen Reactions  . Clindamycin/Lincomycin Nausea And Vomiting  . Penicillins Rash       . Spiriva [Tiotropium Bromide Monohydrate]     General intolerance  . Sulfa Antibiotics Hives and Rash    Severe Hives with skin reaction   Family History  Problem Relation Age of Onset  . Other Mother        polyarteritis  . Bowel Disease Father 33       colectomy/bowel obstruction issues  . Heart attack Father   . Hypertension Brother   . Arthritis Brother   . Post-traumatic stress disorder Brother   . Alcohol abuse Other   . Depression Other   . Hearing loss Other   . Heart disease Other   . Hypertension Other   . Anesthesia problems Neg Hx   . Hypotension Neg Hx   . Malignant hyperthermia Neg Hx   . Pseudochol deficiency Neg Hx   . Breast cancer Neg Hx    - Family history otherwise reviewed and not pertinent.  Prior to Admission medications   Medication Sig Start Date End Date Taking? Authorizing Provider  acetaminophen (TYLENOL) 325 MG tablet Take 325 mg by mouth every 6 (six) hours as needed for moderate pain. Alternates with Ibuprofen   Yes [provider]  amLODipine (NORVASC) 5 MG tablet TAKE 1 TABLET BY MOUTH AT BEDTIME Patient taking differently: Take 5 mg by mouth daily. 08/31/19  Yes Koberlein, Steele Berg, MD  Budeson-Glycopyrrol-Formoterol (BREZTRI AEROSPHERE) 160-9-4.8 MCG/ACT AERO Inhale 2 puffs into the lungs 2 (two) times daily. 04/25/20  Yes Brand Males, MD  cholecalciferol (VITAMIN D3) 25 MCG (1000 UNIT) tablet Take 1,000 Units by mouth daily.   Yes [provider]  FLUoxetine (PROZAC) 20 MG capsule Take 1 capsule by mouth twice daily Patient taking differently: Take 20 mg by mouth 2 (two) times  daily. 06/17/20  Yes Koberlein, Steele Berg, MD  PROAIR HFA 108 (90 Base) MCG/ACT inhaler INHALE 2 PUFFS BY MOUTH EVERY 6 HOURS AS NEEDED FOR WHEEZING OR SHORTNESS OF BREATH Patient taking differently: Inhale 2 puffs into the lungs every 6 (six) hours as needed for wheezing or shortness of breath. INHALE 2 PUFFS BY MOUTH EVERY 6 HOURS AS NEEDED FOR WHEEZING OR SHORTNESS OF BREATH 04/15/20  Yes Brand Males, MD  rosuvastatin (CRESTOR) 5 MG tablet Take 1 tablet by mouth once daily Patient taking differently: Take 5 mg by mouth daily. 06/17/20  Yes Koberlein, Steele Berg, MD  vitamin B-12 (CYANOCOBALAMIN) 1000 MCG tablet Take 1,000  mcg by mouth daily.    Yes [provider]  vitamin E 400 UNIT capsule Take 400 Units by mouth daily.   Yes [provider]    Physical Exam: Vitals:   07/19/20 0957 07/19/20 1120 07/19/20 1510 07/19/20 1624  BP: 104/70  (!) 121/91   Pulse: 82  81   Resp: 16  14   Temp: 97.9 F (36.6 C)  97.8 F (36.6 C)   TempSrc: Oral  Oral   SpO2: 90% 93% 90% 92%  Weight:  54 kg    Height:  5' 2.01" (1.575 m)     Constitutional: Chronically ill-appearing female in no distress, calm demeanor with raspy voice Eyes: Lids and conjunctivae normal, PERRL ENMT: Mucous membranes are moist. Posterior pharynx clear of any exudate or lesions. Fair dentition.  Neck: normal, supple, no masses, no thyromegaly Respiratory: Non-labored without accessory muscle use. Diminished globally with crackles over right base. No wheezing. Expiratory phase only mildly prolonged. Cardiovascular: Regular rate and rhythm, no murmurs, rubs, or gallops. No carotid bruits. No JVD. No LE edema. Palpable pedal pulses. Abdomen: Normoactive bowel sounds. No tenderness, non-distended, and no masses palpated. No hepatosplenomegaly. GU: No indwelling catheter Musculoskeletal: No cyanosis. No joint deformity upper and lower extremities. Good ROM, no contractures. Normal muscle tone.  Skin: Warm, dry.  Surgical incisions not examined. Neurologic: CN II-XII grossly intact. Speech normal. No focal deficits in motor strength or sensation in all extremities.  Psychiatric: Alert and oriented x3. Normal judgment and insight. Mood euthymic with congruent affect.   Labs on Admission: I have personally reviewed following labs and imaging studies  CBC: Recent Labs  Lab 07/16/20 1500 07/19/20 0542  WBC 6.0 8.6  HGB 15.0 12.4  HCT 43.8 37.1  MCV 96.7 98.4  PLT 200 542   Basic Metabolic Panel: Recent Labs  Lab 07/16/20 1500 07/19/20 0542  NA 134* 133*  K 4.0 4.5  CL 100 100  CO2 26 23  GLUCOSE 85 96  BUN 9 14  CREATININE 0.93 1.06*  CALCIUM 9.6 8.7*  MG  --  2.1   GFR: Estimated Creatinine Clearance: 35.2 mL/min (A) (by C-G formula based on SCr of 1.06 mg/dL (H)).  Recent Results (from the past 240 hour(s))  SARS CORONAVIRUS 2 (TAT 6-24 HRS) Nasopharyngeal Nasopharyngeal Swab     Status: None   Collection Time: 07/15/20 10:43 AM   Specimen: Nasopharyngeal Swab  Result Value Ref Range Status   SARS Coronavirus 2 NEGATIVE NEGATIVE Final    Comment: (NOTE) SARS-CoV-2 target nucleic acids are NOT DETECTED.  The SARS-CoV-2 RNA is generally detectable in upper and lower respiratory specimens during the acute phase of infection. Negative results do not preclude SARS-CoV-2 infection, do not rule out co-infections with other pathogens, and should not be used as the sole basis for treatment or other patient management decisions. Negative results must be combined with clinical observations, patient history, and epidemiological information. The expected result is Negative.  Fact Sheet for Patients: SugarRoll.be  Fact Sheet for Healthcare Providers: https://www.woods-mathews.com/  This test is not yet approved or cleared by the [place] FDA and  has been authorized for detection and/or diagnosis of SARS-CoV-2 by FDA under an Emergency  Use Authorization (EUA). This EUA will remain  in effect (meaning this test can be used) for the duration of the COVID-19 declaration under Se ction 564(b)(1) of the Act, 21 U.S.C. section 360bbb-3(b)(1), unless the authorization is terminated or revoked sooner.  Performed at [facility]  Lab, [address], [place], [place] [address]      Radiological Exams on Admission: DG CHEST PORT 1 VIEW  Result Date: 07/19/2020 CLINICAL DATA:  Wheezing. EXAM: PORTABLE CHEST 1 VIEW COMPARISON:  April 21, 2015. FINDINGS: The heart size and mediastinal contours are within normal limits. No pneumothorax or pleural effusion is noted. Left lung is clear. Stable postoperative changes seen laterally in the right midlung. New right basilar opacity is noted concerning for subsegmental atelectasis or possibly pneumonia. The visualized skeletal structures are unremarkable. IMPRESSION: New right basilar opacity is noted concerning for subsegmental atelectasis or possibly pneumonia. Electronically Signed   By: Marijo Conception M.D.   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 Fluid overload may have contributed which was treated with lasix x1. Underlying COPD seems to have made it easier to to tip into respiratory failure, and may also be slightly exacerbated.  - Encourage use of incentive spirometry, instructed on its use. Pt has close friend at bedside who will continue to enforce this strong recommendation.  - OOB as much as possible. - Add duonebs scheduled, albuterol prn - Continue maintenance bronchodilators - Avoid steroids postoperatively unless wheezing recurs despite inhaled Tx. Pt does not regularly require prednisone as outpatient.  - Supplement oxygen at least qHS to minimize long term pulmonary HTN.  - Can check ambulatory pulse  oximetry prior to DC, though pt already has O2 at home. - Follow up with pulmonary after DC, Dr. Chase Caller - Maintain VTE ppx - If fever develops, strongly consider repeat CXR and coverage for aspiration PNA. This does not appear likely at this time.  History of stage I lung CA s/p lobectomy 2013 by Dr. Servando Snare and followed by Dr. Julien Nordmann under surveillance with no known evidence of recurrence.   Stage IIIb CKD:  - Monitor Cr and electrolytes in AM following lasix.  s/p laparoscopic bilateral spigelian hernias and umbilical hernia repair with mesh 07/18/2020 by Dr. Johney Maine:  - Per primary.  - Minimize narcotics as able  Patrecia Pour, MD [facility] [email or web address] 07/19/2020, 5:26 PM

## 2020-07-19 NOTE — Progress Notes (Signed)
Patient ID: [name], female   DOB: Jun 12, 1941, 79 y.o.   MRN: [id number] Hold on dc due to sats and wheezing, will schedule albuterol, check chest xray and give small dose lasix, iv fluids off,  She has been told to wear oxygen at night and I think this is exacerbation of baseline. She may very well need dc on home o2

## 2020-07-20 ENCOUNTER — Ambulatory Visit (HOSPITAL_COMMUNITY): Payer: Medicare Other

## 2020-07-20 DIAGNOSIS — J9589 Other postprocedural complications and disorders of respiratory system, not elsewhere classified: Secondary | ICD-10-CM | POA: Diagnosis present

## 2020-07-20 DIAGNOSIS — J9811 Atelectasis: Secondary | ICD-10-CM | POA: Diagnosis present

## 2020-07-20 DIAGNOSIS — M8580 Other specified disorders of bone density and structure, unspecified site: Secondary | ICD-10-CM | POA: Diagnosis present

## 2020-07-20 DIAGNOSIS — N1832 Chronic kidney disease, stage 3b: Secondary | ICD-10-CM | POA: Diagnosis present

## 2020-07-20 DIAGNOSIS — E8770 Fluid overload, unspecified: Secondary | ICD-10-CM | POA: Diagnosis present

## 2020-07-20 DIAGNOSIS — Y813 Surgical instruments, materials and general- and plastic-surgery devices (including sutures) associated with adverse incidents: Secondary | ICD-10-CM | POA: Diagnosis present

## 2020-07-20 DIAGNOSIS — E785 Hyperlipidemia, unspecified: Secondary | ICD-10-CM | POA: Diagnosis present

## 2020-07-20 DIAGNOSIS — K436 Other and unspecified ventral hernia with obstruction, without gangrene: Secondary | ICD-10-CM | POA: Diagnosis present

## 2020-07-20 DIAGNOSIS — Y838 Other surgical procedures as the cause of abnormal reaction of the patient, or of later complication, without mention of misadventure at the time of the procedure: Secondary | ICD-10-CM | POA: Diagnosis present

## 2020-07-20 DIAGNOSIS — E1122 Type 2 diabetes mellitus with diabetic chronic kidney disease: Secondary | ICD-10-CM | POA: Diagnosis present

## 2020-07-20 DIAGNOSIS — J449 Chronic obstructive pulmonary disease, unspecified: Secondary | ICD-10-CM | POA: Diagnosis not present

## 2020-07-20 DIAGNOSIS — Z85118 Personal history of other malignant neoplasm of bronchus and lung: Secondary | ICD-10-CM | POA: Diagnosis not present

## 2020-07-20 DIAGNOSIS — K4090 Unilateral inguinal hernia, without obstruction or gangrene, not specified as recurrent: Secondary | ICD-10-CM | POA: Diagnosis present

## 2020-07-20 DIAGNOSIS — Z96643 Presence of artificial hip joint, bilateral: Secondary | ICD-10-CM | POA: Diagnosis present

## 2020-07-20 DIAGNOSIS — I129 Hypertensive chronic kidney disease with stage 1 through stage 4 chronic kidney disease, or unspecified chronic kidney disease: Secondary | ICD-10-CM | POA: Diagnosis present

## 2020-07-20 DIAGNOSIS — Z87891 Personal history of nicotine dependence: Secondary | ICD-10-CM | POA: Diagnosis not present

## 2020-07-20 DIAGNOSIS — G928 Other toxic encephalopathy: Secondary | ICD-10-CM | POA: Diagnosis present

## 2020-07-20 DIAGNOSIS — Z8261 Family history of arthritis: Secondary | ICD-10-CM | POA: Diagnosis not present

## 2020-07-20 DIAGNOSIS — F05 Delirium due to known physiological condition: Secondary | ICD-10-CM | POA: Diagnosis present

## 2020-07-20 DIAGNOSIS — J439 Emphysema, unspecified: Secondary | ICD-10-CM | POA: Diagnosis present

## 2020-07-20 DIAGNOSIS — Z20822 Contact with and (suspected) exposure to covid-19: Secondary | ICD-10-CM | POA: Diagnosis present

## 2020-07-20 DIAGNOSIS — K439 Ventral hernia without obstruction or gangrene: Secondary | ICD-10-CM | POA: Diagnosis present

## 2020-07-20 DIAGNOSIS — R410 Disorientation, unspecified: Secondary | ICD-10-CM | POA: Diagnosis not present

## 2020-07-20 DIAGNOSIS — Z8249 Family history of ischemic heart disease and other diseases of the circulatory system: Secondary | ICD-10-CM | POA: Diagnosis not present

## 2020-07-20 DIAGNOSIS — Z96653 Presence of artificial knee joint, bilateral: Secondary | ICD-10-CM | POA: Diagnosis present

## 2020-07-20 DIAGNOSIS — I10 Essential (primary) hypertension: Secondary | ICD-10-CM | POA: Diagnosis not present

## 2020-07-20 DIAGNOSIS — K429 Umbilical hernia without obstruction or gangrene: Secondary | ICD-10-CM | POA: Diagnosis present

## 2020-07-20 DIAGNOSIS — Z8589 Personal history of malignant neoplasm of other organs and systems: Secondary | ICD-10-CM | POA: Diagnosis not present

## 2020-07-20 DIAGNOSIS — G9341 Metabolic encephalopathy: Secondary | ICD-10-CM | POA: Diagnosis not present

## 2020-07-20 DIAGNOSIS — J9621 Acute and chronic respiratory failure with hypoxia: Secondary | ICD-10-CM | POA: Diagnosis present

## 2020-07-20 LAB — BLOOD GAS, ARTERIAL
Acid-Base Excess: 2.8 mmol/L — ABNORMAL HIGH (ref 0.0–2.0)
Bicarbonate: 26.2 mmol/L (ref 20.0–28.0)
O2 Saturation: 91.2 %
Patient temperature: 98.6
pCO2 arterial: 37.9 mmHg (ref 32.0–48.0)
pH, Arterial: 7.454 — ABNORMAL HIGH (ref 7.350–7.450)
pO2, Arterial: 57.4 mmHg — ABNORMAL LOW (ref 83.0–108.0)

## 2020-07-20 LAB — RENAL FUNCTION PANEL
Albumin: 3.8 g/dL (ref 3.5–5.0)
Anion gap: 9 (ref 5–15)
BUN: 10 mg/dL (ref 8–23)
CO2: 26 mmol/L (ref 22–32)
Calcium: 8.7 mg/dL — ABNORMAL LOW (ref 8.9–10.3)
Chloride: 97 mmol/L — ABNORMAL LOW (ref 98–111)
Creatinine, Ser: 0.81 mg/dL (ref 0.44–1.00)
GFR, Estimated: >60 mL/min (ref >60)
Glucose, Bld: 110 mg/dL — ABNORMAL HIGH (ref 70–99)
Phosphorus: 2.5 mg/dL (ref 2.5–4.6)
Potassium: 3.8 mmol/L (ref 3.5–5.1)
Sodium: 132 mmol/L — ABNORMAL LOW (ref 135–145)

## 2020-07-20 MED ORDER — PHENOL 1.4 % MT LIQD
1.0000 | OROMUCOSAL | Status: DC | PRN
  Filled 2020-07-20: qty 177

## 2020-07-20 NOTE — Progress Notes (Signed)
Patient refused to Ambulate. Patient said that she feels so week. So unable to monitor her oxygen while she is walking.

## 2020-07-20 NOTE — Progress Notes (Signed)
Spoke w pt and family friend in room about status of her husband today. Offered to set up a face-time phone call so that the pt can see and talk w her husband today. Pt is adamently refusing this offer, states "he does this all the time"  and she is too tired to talk.

## 2020-07-20 NOTE — Progress Notes (Signed)
Received order to ambulate pt and ck O2 sats - unable to complete as pt is too weak to use walker w staff in the hallways. Only able to stand and pivot to [facility] and chair w assist, follows instructions very poorly. MD made aware and PT order requested.

## 2020-07-20 NOTE — Progress Notes (Signed)
Pt remains confused and restless, pulling at abd binder, taking her O2 off, pulling at her new NSL (covered w guaze). Family at bedside.

## 2020-07-20 NOTE — Consult Note (Signed)
Medical Consultation   [name]  LNL:[id number]  DOB: [birth year]  DOA: 07/18/2020  PCP: Caren Macadam, MD    Requesting physician:   Reason for consultation: Multiple medical reasons  Covid vaccination; vaccinated   History of Present Illness: [name]  79 y.o. WF PMHx COPD, stage I lung CA s/p lobectomy chronic respiratory failure on home O2 noncompliant with its use partly because the concentrator is too loud, HTN, HLD, depression   who underwent hernia repair 07/18/2020 and later developed wheezing and hypoxia on POD #1 delaying discharge for which medicine is consulted.  The patient reports her wheezing was constant and moderate for her, came on gradually, was significantly improved with albuterol breathing treatments at 11am and ~4pm. She wishes to discharge as soon as possible. Says she has IS and oxygen at home that she doesn't regularly use, partly because the concentrator is too loud.   Carries diagnosis of moderate COPD by PFTs Feb 2017 (FEV 1 56%, FVC 87%, ratio 49, DLCO 43%. Moderate emphysema on imaging. Uses albuterol ~2 times per week. Has received covid vaccinations (x3) and flu vaccination this year. Has no sick contacts. Does have history of Pneumococcal pneumonia not requiring hospitalization years ago.  Review of Systems: Denies fever, chills, headache, cough, sore throat, chest pain, palpitations, shortness of breath, abdominal pain, nausea, vomiting, changes in bowel habits, blood in stool, change in bladder habits, myalgias, arthralgias, rash, and per HPI. All others reviewed and are negative.  Covid vaccination; Review of Systems:  Review of Systems  Constitutional: Negative.   HENT: Negative.   Eyes: Negative.   Respiratory: Positive for shortness of breath. Negative for cough, hemoptysis, sputum production and wheezing.   Cardiovascular: Negative.   Gastrointestinal: Negative.   Genitourinary: Negative.    Skin: Negative.    As per HPI otherwise 10 point review of systems negative.  Unacceptable ROS statements: "10 systems reviewed," "Extensive" (without elaboration).  Acceptable ROS statements: "All others negative," "All others reviewed and are negative," and "All others unremarkable," with at [place] documented Can't double dip - if using for HPI can't use for ROS   Past Medical History: Past Medical History:  Diagnosis Date  . Allergic rhinitis   . Anemia   . Anxiety   . Arthritis    all over  . Bilateral cataracts   . Chicken pox   . Cholelithiasis 05/21/2014   On CT scan 2015   . COPD (chronic obstructive pulmonary disease) ([place])   . Depression    hx ocd and anxiety in record as well  . Gout    last time 6-25yrs ago   . History of blood transfusion   . Hot flashes    takes Prozac daily  . Hx of migraines 90's   after menopause HA stopped  . Hyperlipidemia    taking Flax Seed Oil and Fish Oil  . Hypertension    takes Amlodipine nightly  . IBS (irritable bowel syndrome)   . Liver cyst   . Macrocytosis   . Osteopenia    takes Calcium and Vit D bid  . Pneumonia    x 2 ;last time back in 1999  . PONV (postoperative nausea and vomiting)   . 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 Seasonal allergies   . Smoking 09/12/2011    Past Surgical History:  Past Surgical History:  Procedure Laterality Date  . CARPAL TUNNEL RELEASE  80's   right   . COLONOSCOPY    . cyst removed  >4yrs ago   from left leg  . DILATION AND CURETTAGE OF UTERUS     at age 4  . EXCISIONAL TOTAL KNEE ARTHROPLASTY Right 06/03/2014   Procedure: RIGHT KNEE SCAR EXCISION;  Surgeon: [name], MD;  Location: [facility];  Service: Orthopedics;  Laterality: Right;  . INGUINAL HERNIA REPAIR  2000  . ROTATOR CUFF REPAIR  2001   right  . TONSILLECTOMY  as a child   and adenoids  . TOTAL ABDOMINAL HYSTERECTOMY  2003   benign tumor-[place]  . TOTAL HIP  ARTHROPLASTY  2011   left  . TOTAL HIP ARTHROPLASTY  03/07/2012   Procedure: TOTAL HIP ARTHROPLASTY ANTERIOR APPROACH;  Surgeon: [name], MD;  Location: [facility];  Service: Orthopedics;  Laterality: Right;  . TOTAL KNEE ARTHROPLASTY Right 01/02/2013   Procedure: RIGHT TOTAL KNEE ARTHROPLASTY;  Surgeon: [name], MD;  Location: [facility];  Service: Orthopedics;  Laterality: Right;  . TOTAL KNEE ARTHROPLASTY Left 04/03/2013   Procedure: LEFT TOTAL KNEE ARTHROPLASTY;  Surgeon: [name], MD;  Location: [facility];  Service: Orthopedics;  Laterality: Left;  Marland Kitchen VIDEO BRONCHOSCOPY  10/20/2011   Procedure: VIDEO BRONCHOSCOPY;  Surgeon: Grace Isaac, MD;  Location: [facility];  Service: Thoracic;  Laterality: N/A;  . WEDGE RESECTION  10-20-2011   rt lung  - for lung cancer     Allergies:   Allergies  Allergen Reactions  . Clindamycin/Lincomycin Nausea And Vomiting  . Penicillins Rash       . Spiriva [Tiotropium Bromide Monohydrate]     General intolerance  . Sulfa Antibiotics Hives and Rash    Severe Hives with skin reaction     Social History:  reports that she quit smoking about 8 years ago. Her smoking use included cigarettes. She has a 25.00 pack-year smoking history. She has never used smokeless tobacco. She reports current alcohol use. She reports that she does not use drugs.   Family History: Family History  Problem Relation Age of Onset  . Other Mother        polyarteritis  . Bowel Disease Father 50       colectomy/bowel obstruction issues  . Heart attack Father   . Hypertension Brother   . Arthritis Brother   . Post-traumatic stress disorder Brother   . Alcohol abuse Other   . Depression Other   . Hearing loss Other   . Heart disease Other   . Hypertension Other   . Anesthesia problems Neg Hx   . Hypotension Neg Hx   . Malignant hyperthermia Neg Hx   . Pseudochol deficiency Neg Hx   . Breast cancer Neg Hx      I have personally reviewed and interpreted  all radiology studies and my findings are as above.  VENTILATOR SETTINGS: Aerosol mask 12/12 Flow; 8 L/min SPO2 99%  Cultures   Antimicrobials: Anti-infectives (From admission, onward)   Start     Ordered Stop   07/18/20 0600  clindamycin (CLEOCIN) IVPB 900 mg       "And" Linked Group Details   07/17/20 0945 07/18/20 0918   07/18/20 0600  gentamicin (GARAMYCIN) 270 mg in dextrose 5 % 100 mL IVPB       "And" Linked Group Details   07/17/20 0945 07/18/20 0935  Devices    LINES / TUBES:      Continuous Infusions: . sodium chloride    . sodium chloride       Physical Exam: Vitals:   07/19/20 2113 07/19/20 2155 07/20/20 0550 07/20/20 0736  BP:  (!) 136/93 (!) 137/95   Pulse:  86 88   Resp:  15 17   Temp:  99.1 F (37.3 C) 98.7 F (37.1 C)   TempSrc:  Oral Oral   SpO2: 96% 97% 90% 99%  Weight:      Height:        General: A/O x3 (does not know where), positive chronic respiratory distress Eyes: negative scleral hemorrhage, negative anisocoria, negative icterus ENT: Negative Runny nose, negative gingival bleeding, Neck:  Negative scars, masses, torticollis, lymphadenopathy, JVD Lungs: decreased breath sounds bilaterally consistent with emphysema and lobectomy, bilaterally without wheezes or crackles Cardiovascular: Regular rate and rhythm without murmur gallop or rub normal S1 and S2 Abdomen: negative abdominal pain, nondistended, positive soft, bowel sounds, no rebound, no ascites, no appreciable mass, binder in place did not remove Extremities: No significant cyanosis, clubbing, or edema bilateral lower extremities Skin: Negative rashes, lesions, ulcers Psychiatric:  Negative depression, negative anxiety, negative fatigue, negative mania  Central nervous system:  Cranial nerves II through XII intact, tongue/uvula midline, all extremities muscle strength 5/5, sensation intact throughout,  negative dysarthria, negative expressive aphasia, negative receptive  aphasia.  Data reviewed:  I have personally reviewed following labs and imaging studies Labs:  CBC: Recent Labs  Lab 07/16/20 1500 07/19/20 0542  WBC 6.0 8.6  HGB 15.0 12.4  HCT 43.8 37.1  MCV 96.7 98.4  PLT 200 235    Basic Metabolic Panel: Recent Labs  Lab 07/16/20 1500 07/19/20 0542 07/20/20 0510  NA 134* 133* 132*  K 4.0 4.5 3.8  CL 100 100 97*  CO2 26 23 26   GLUCOSE 85 96 110*  BUN 9 14 10   CREATININE 0.93 1.06* 0.81  CALCIUM 9.6 8.7* 8.7*  MG  --  2.1  --   PHOS  --   --  2.5   GFR Estimated Creatinine Clearance: 46 mL/min (by C-G formula based on SCr of 0.81 mg/dL). Liver Function Tests: Recent Labs  Lab 07/20/20 0510  ALBUMIN 3.8   No results for input(s): LIPASE, AMYLASE in the last 168 hours. No results for input(s): AMMONIA in the last 168 hours. Coagulation profile No results for input(s): INR, PROTIME in the last 168 hours.  Cardiac Enzymes: No results for input(s): CKTOTAL, CKMB, CKMBINDEX, TROPONINI in the last 168 hours. BNP: Invalid input(s): POCBNP CBG: No results for input(s): GLUCAP in the last 168 hours. D-Dimer No results for input(s): DDIMER in the last 72 hours. Hgb A1c No results for input(s): HGBA1C in the last 72 hours. Lipid Profile No results for input(s): CHOL, HDL, LDLCALC, TRIG, CHOLHDL, LDLDIRECT in the last 72 hours. Thyroid function studies No results for input(s): TSH, T4TOTAL, T3FREE, THYROIDAB in the last 72 hours.  Invalid input(s): FREET3 Anemia work up No results for input(s): VITAMINB12, FOLATE, FERRITIN, TIBC, IRON, RETICCTPCT in the last 72 hours. Urinalysis    Component Value Date/Time   COLORURINE AMBER (A) 05/27/2014 1444   APPEARANCEUR CLOUDY (A) 05/27/2014 1444   LABSPEC 1.025 05/27/2014 1444   PHURINE 5.0 05/27/2014 [place] 05/27/2014 1444   HGBUR NEGATIVE 05/27/2014 [place] 05/27/2014 [place] 05/27/2014 1444   PROTEINUR NEGATIVE  05/27/2014 1444  UROBILINOGEN 0.2 05/27/2014 1444   NITRITE NEGATIVE 05/27/2014 1444   LEUKOCYTESUR SMALL (A) 05/27/2014 1444     Microbiology Recent Results (from the past 240 hour(s))  SARS CORONAVIRUS 2 (TAT 6-24 HRS) Nasopharyngeal Nasopharyngeal Swab     Status: None   Collection Time: 07/15/20 10:43 AM   Specimen: Nasopharyngeal Swab  Result Value Ref Range Status   SARS Coronavirus 2 NEGATIVE NEGATIVE Final    Comment: (NOTE) SARS-CoV-2 target nucleic acids are NOT DETECTED.  The SARS-CoV-2 RNA is generally detectable in upper and lower respiratory specimens during the acute phase of infection. Negative results do not preclude SARS-CoV-2 infection, do not rule out co-infections with other pathogens, and should not be used as the sole basis for treatment or other patient management decisions. Negative results must be combined with clinical observations, patient history, and epidemiological information. The expected result is Negative.  Fact Sheet for Patients: SugarRoll.be  Fact Sheet for Healthcare Providers: https://www.-mathews.com/  This test is not yet approved or cleared by the [place] FDA and  has been authorized for detection and/or diagnosis of SARS-CoV-2 by FDA under an Emergency Use Authorization (EUA). This EUA will remain  in effect (meaning this test can be used) for the duration of the COVID-19 declaration under Se ction 564(b)(1) of the Act, 21 U.S.C. section 360bbb-3(b)(1), unless the authorization is terminated or revoked sooner.  Performed at [facility], [place] [address] [place], [place] [address]        Inpatient Medications:   Scheduled Meds: . acetaminophen  1,000 mg Oral Q6H  . amLODipine  5 mg Oral Daily  . cholecalciferol  1,000 Units Oral Daily  . enoxaparin (LOVENOX) injection  40 mg Subcutaneous Q24H  . FLUoxetine  20 mg Oral BID  . fluticasone furoate-vilanterol  1 puff  Inhalation Daily   And  . umeclidinium bromide  1 puff Inhalation Daily  . ipratropium-albuterol  3 mL Nebulization Q8H  . rosuvastatin  5 mg Oral Daily  . sodium chloride flush  3 mL Intravenous Q12H  . vitamin B-12  1,000 mcg Oral Daily   Continuous Infusions: . sodium chloride    . sodium chloride       Radiological Exams on Admission: DG CHEST PORT 1 VIEW  Result Date: 07/19/2020 CLINICAL DATA:  Wheezing. EXAM: PORTABLE CHEST 1 VIEW COMPARISON:  April 21, 2015. FINDINGS: The heart size and mediastinal contours are within normal limits. No pneumothorax or pleural effusion is noted. Left lung is clear. Stable postoperative changes seen laterally in the right midlung. New right basilar opacity is noted concerning for subsegmental atelectasis or possibly pneumonia. The visualized skeletal structures are unremarkable. IMPRESSION: New right basilar opacity is noted concerning for subsegmental atelectasis or possibly pneumonia. Electronically Signed   By: Marijo Conception M.D.   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  Patient had removed her O2.  Become confused, trying to get out of the bed, not making sense to the ED staff. -Patient placed back on 2 L O2 nasal cannula and encephalopathy resolved. -Counseled patient at length that given her significant lung disease she had ZERO reserve, to remove her oxygen during the day or at night.  Patient will require O2 24 hours a day.  Acute  on chronic respiratory failure with hypoxia/COPD Gold II - Primarily suspect postoperative atelectasis based on CXR finding in absence of fever, new cough, etc. Fluid overload may have contributed which was treated with lasix x1. Underlying COPD  seems to have made it easier to to tip into respiratory failure, and may also be slightly exacerbated.  - Encourage use of incentive spirometry, instructed on its use. Pt has close friend at bedside who will continue to enforce this strong recommendation.  - OOB as much as possible. - Add duonebs scheduled, albuterol prn - Continue maintenance bronchodilators - Avoid steroids postoperatively unless wheezing recurs despite inhaled Tx. Pt does not regularly require prednisone as outpatient.  - Supplement oxygen 24 hours a day to minimize long term pulmonary HTN, and episodes of acute encephalopathy.  -12/2 ambulatory SPO2 pending. - Follow up with pulmonary after DC, Dr. Chase Caller - Maintain VTE ppx - If fever develops, strongly consider repeat CXR and coverage for aspiration PNA. This does not appear likely at this time.  History of stage I lung CA  -S/p lobectomy 2013 by Dr. Servando Snare and followed by Dr. Julien Nordmann under surveillance with no known evidence of recurrence.   Stage IIIb CKD:  - Monitor Cr and electrolytes in AM following lasix. Lab Results  Component Value Date   CREATININE 0.81 07/20/2020   CREATININE 1.06 (H) 07/19/2020   CREATININE 0.93 07/16/2020   CREATININE 0.81 12/03/2019   CREATININE 0.96 02/16/2019  -Back to normal.   S/p Llaparoscopic bilateral spigelian hernias and umbilical hernia repair with mesh 07/18/2020 by Dr. Johney Maine:  - Per primary.  - Minimize narcotics as able   Thank you for this consultation.  Our [facility] hospitalist team will follow the patient with you.   Time Spent: 35 min  Senora Lacson J M.D. [facility] 07/20/2020, 8:14 AM  [phone]

## 2020-07-20 NOTE — Progress Notes (Signed)
Patient has had episodes of confusion today, has gotten worse throughout day. Bed alarm had alarmed multiple times due to patient attempting to get out of bed, patient bed alarm went off and staff ran to check on patient, patient was sitting in the floor on her bottom when staff arrived. Staff did not witness fall, vitals were taken and patient was assessed and placed back in bed. Patient has no c/o of any pain. MD on call notified and contact person notified of fall. Order placed for sitter, but no staff currently available to sit with patient. Verbal order for safety restraints given, waist restraints applied. Patient is stable and being monitored. Safety portal will be completed, huddle was done with staff. Dawson Bills, RN

## 2020-07-20 NOTE — Progress Notes (Signed)
2 Days Post-Op   Subjective/Chief Complaint: Pt with some confusion overnight and dyspnea On O2   Objective: Vital signs in last 24 hours: Temp:  [97.8 F (36.6 C)-99.1 F (37.3 C)] 98.7 F (37.1 C) (12/12 0550) Pulse Rate:  [81-88] 88 (12/12 0550) Resp:  [14-17] 17 (12/12 0550) BP: (104-137)/(70-95) 137/95 (12/12 0550) SpO2:  [90 %-99 %] 99 % (12/12 0736) Weight:  [54 kg] 54 kg (12/11 1120) Last BM Date: 07/19/20  Intake/Output from previous day: 12/11 0701 - 12/12 0700 In: 586.1 [P.O.:480; I.V.:106.1] Out: -  Intake/Output this shift: No intake/output data recorded.  General appearance: alert and cooperative GI: soft, non-tender; bowel sounds normal; no masses,  no organomegaly and inc cdi  Lab Results:  Recent Labs    07/19/20 0542  WBC 8.6  HGB 12.4  HCT 37.1  PLT 156   BMET Recent Labs    07/19/20 0542 07/20/20 0510  NA 133* 132*  K 4.5 3.8  CL 100 97*  CO2 23 26  GLUCOSE 96 110*  BUN 14 10  CREATININE 1.06* 0.81  CALCIUM 8.7* 8.7*   PT/INR No results for input(s): LABPROT, INR in the last 72 hours. ABG No results for input(s): PHART, HCO3 in the last 72 hours.  Invalid input(s): PCO2, PO2  Studies/Results: DG CHEST PORT 1 VIEW  Result Date: 07/19/2020 CLINICAL DATA:  Wheezing. EXAM: PORTABLE CHEST 1 VIEW COMPARISON:  April 21, 2015. FINDINGS: The heart size and mediastinal contours are within normal limits. No pneumothorax or pleural effusion is noted. Left lung is clear. Stable postoperative changes seen laterally in the right midlung. New right basilar opacity is noted concerning for subsegmental atelectasis or possibly pneumonia. The visualized skeletal structures are unremarkable. IMPRESSION: New right basilar opacity is noted concerning for subsegmental atelectasis or possibly pneumonia. Electronically Signed   By: Marijo Conception M.D.   On: 07/19/2020 15:54    Anti-infectives: Anti-infectives (From admission, onward)   Start      Dose/Rate Route Frequency Ordered Stop   07/18/20 0600  clindamycin (CLEOCIN) IVPB 900 mg       "And" Linked Group Details   900 mg 100 mL/hr over 30 Minutes Intravenous On call to O.R. 07/17/20 0945 07/18/20 0918   07/18/20 0600  gentamicin (GARAMYCIN) 270 mg in dextrose 5 % 100 mL IVPB       "And" Linked Group Details   5 mg/kg  54.9 kg 213.5 mL/hr over 30 Minutes Intravenous On call to O.R. 07/17/20 0945 07/18/20 0935      Assessment/Plan: s/p Procedure(s): LAPAROSCOPIC BILATERAL SPIGELIABN HERNIAS WITH MESH, UMBILICAL HERNIA (N/A) HERNIA  REPAIR INGUINAL ADULT REPAIR (Left) -repeat CXR this AM -ABG this AM -Appreciate medicine assistance -plan cont inpt d/t resp status  LOS: 0 days    Ralene Ok 07/20/2020

## 2020-07-20 NOTE — Progress Notes (Signed)
Pt w acute confusion overnight and this am. Pulled NSL out and trying to get OOB multiple times, has to "go get teeth cleaned." O2 on a 4L per [place] w O2 sats in the mid 90"s. O2 reduced to 2 L. Neuro = grips equal and strong, MOE X 4, Pupils equal and reactive, smile even. Resp mod labored w audible exp wheezing noted, pt using accessory muscles to pull air in. RT in to give pt neb tx, HOB elevated. BP 147/95, pulse tachy in the 90's. MD made aware of and orders noted. Family member at bedside and brief update shared w them.

## 2020-07-21 ENCOUNTER — Encounter (HOSPITAL_COMMUNITY): Payer: Self-pay | Admitting: Surgery

## 2020-07-21 ENCOUNTER — Inpatient Hospital Stay (HOSPITAL_COMMUNITY): Payer: Medicare Other

## 2020-07-21 DIAGNOSIS — G9341 Metabolic encephalopathy: Secondary | ICD-10-CM

## 2020-07-21 DIAGNOSIS — R410 Disorientation, unspecified: Secondary | ICD-10-CM

## 2020-07-21 DIAGNOSIS — I10 Essential (primary) hypertension: Secondary | ICD-10-CM

## 2020-07-21 DIAGNOSIS — J449 Chronic obstructive pulmonary disease, unspecified: Secondary | ICD-10-CM

## 2020-07-21 DIAGNOSIS — E876 Hypokalemia: Secondary | ICD-10-CM

## 2020-07-21 DIAGNOSIS — R0902 Hypoxemia: Secondary | ICD-10-CM

## 2020-07-21 HISTORY — DX: Disorientation, unspecified: R41.0

## 2020-07-21 LAB — CBC WITH DIFFERENTIAL/PLATELET
Abs Immature Granulocytes: 0.05 10*3/uL (ref 0.00–0.07)
Basophils Absolute: 0 10*3/uL (ref 0.0–0.1)
Basophils Relative: 0 %
Eosinophils Absolute: 0 10*3/uL (ref 0.0–0.5)
Eosinophils Relative: 0 %
HCT: 34.3 % — ABNORMAL LOW (ref 36.0–46.0)
Hemoglobin: 11.7 g/dL — ABNORMAL LOW (ref 12.0–15.0)
Immature Granulocytes: 0 %
Lymphocytes Relative: 4 %
Lymphs Abs: 0.5 10*3/uL — ABNORMAL LOW (ref 0.7–4.0)
MCH: 32.9 pg (ref 26.0–34.0)
MCHC: 34.1 g/dL (ref 30.0–36.0)
MCV: 96.3 fL (ref 80.0–100.0)
Monocytes Absolute: 0.4 10*3/uL (ref 0.1–1.0)
Monocytes Relative: 4 %
Neutro Abs: 10.9 10*3/uL — ABNORMAL HIGH (ref 1.7–7.7)
Neutrophils Relative %: 92 %
Platelets: 173 10*3/uL (ref 150–400)
RBC: 3.56 MIL/uL — ABNORMAL LOW (ref 3.87–5.11)
RDW: 11.9 % (ref 11.5–15.5)
WBC: 11.9 10*3/uL — ABNORMAL HIGH (ref 4.0–10.5)
nRBC: 0 % (ref 0.0–0.2)

## 2020-07-21 LAB — COMPREHENSIVE METABOLIC PANEL
ALT: 31 U/L (ref 0–44)
AST: 48 U/L — ABNORMAL HIGH (ref 15–41)
Albumin: 3.6 g/dL (ref 3.5–5.0)
Alkaline Phosphatase: 39 U/L (ref 38–126)
Anion gap: 12 (ref 5–15)
BUN: 14 mg/dL (ref 8–23)
CO2: 26 mmol/L (ref 22–32)
Calcium: 8.6 mg/dL — ABNORMAL LOW (ref 8.9–10.3)
Chloride: 95 mmol/L — ABNORMAL LOW (ref 98–111)
Creatinine, Ser: 0.78 mg/dL (ref 0.44–1.00)
GFR, Estimated: >60 mL/min (ref >60)
Glucose, Bld: 129 mg/dL — ABNORMAL HIGH (ref 70–99)
Potassium: 3.4 mmol/L — ABNORMAL LOW (ref 3.5–5.1)
Sodium: 133 mmol/L — ABNORMAL LOW (ref 135–145)
Total Bilirubin: 1.7 mg/dL — ABNORMAL HIGH (ref 0.3–1.2)
Total Protein: 5.8 g/dL — ABNORMAL LOW (ref 6.5–8.1)

## 2020-07-21 LAB — PHOSPHORUS: Phosphorus: 2.7 mg/dL (ref 2.5–4.6)

## 2020-07-21 LAB — MAGNESIUM: Magnesium: 1.9 mg/dL (ref 1.7–2.4)

## 2020-07-21 MED ORDER — TRAMADOL HCL 50 MG PO TABS: 50.0000 mg | ORAL_TABLET | Freq: Four times a day (QID) | ORAL | Status: DC | PRN

## 2020-07-21 MED ORDER — FUROSEMIDE 20 MG PO TABS
20.0000 mg | ORAL_TABLET | Freq: Once | ORAL | Status: AC
  Administered 2020-07-21: 20 mg via ORAL
  Filled 2020-07-21: qty 1

## 2020-07-21 MED ORDER — METHOCARBAMOL 500 MG PO TABS: 500.0000 mg | ORAL_TABLET | Freq: Two times a day (BID) | ORAL | Status: DC | PRN

## 2020-07-21 MED ORDER — POTASSIUM CHLORIDE CRYS ER 20 MEQ PO TBCR
50.0000 meq | EXTENDED_RELEASE_TABLET | Freq: Once | ORAL | Status: AC
  Administered 2020-07-21: 50 meq via ORAL
  Filled 2020-07-21: qty 2

## 2020-07-21 MED ORDER — TRAMADOL HCL 50 MG PO TABS
50.0000 mg | ORAL_TABLET | Freq: Two times a day (BID) | ORAL | Status: DC | PRN
Start: 2020-07-21 — End: 2020-07-22

## 2020-07-21 MED ORDER — IPRATROPIUM-ALBUTEROL 0.5-2.5 (3) MG/3ML IN SOLN
3.0000 mL | Freq: Four times a day (QID) | RESPIRATORY_TRACT | Status: DC
  Administered 2020-07-21 – 2020-07-22 (×6): 3 mL via RESPIRATORY_TRACT
  Filled 2020-07-21 (×6): qty 3

## 2020-07-21 MED ORDER — POLYETHYLENE GLYCOL 3350 17 G PO PACK
17.0000 g | PACK | Freq: Two times a day (BID) | ORAL | Status: DC
  Administered 2020-07-21 – 2020-07-22 (×4): 17 g via ORAL
  Filled 2020-07-21 (×4): qty 1

## 2020-07-21 MED ORDER — METHOCARBAMOL 500 MG PO TABS: 500.0000 mg | ORAL_TABLET | Freq: Three times a day (TID) | ORAL | Status: DC | PRN

## 2020-07-21 MED ORDER — ACETAMINOPHEN 325 MG PO TABS: 650.0000 mg | ORAL_TABLET | Freq: Four times a day (QID) | ORAL | Status: DC | PRN

## 2020-07-21 MED ORDER — HALOPERIDOL 1 MG PO TABS
1.0000 mg | ORAL_TABLET | Freq: Three times a day (TID) | ORAL | Status: DC | PRN
  Administered 2020-07-22: 1 mg via ORAL
  Filled 2020-07-21 (×3): qty 1

## 2020-07-21 NOTE — NC FL2 (Addendum)
[place] LEVEL OF CARE SCREENING TOOL     IDENTIFICATION  Patient Name: [name] Birthdate: 1941/07/01 Sex: female Admission Date (Current Location): 07/18/2020  [facility] and [place] Number:  Herbalist and Address:  [facility],  [place], [place]      Provider Number: [id number]  Attending Physician Name and Address:  Michael Boston, MD  Relative Name and Phone Number:  Pricilla Larsson [phone]  [phone]    Current Level of Care: Hospital Recommended Level of Care: [place] Prior Approval Number:    Date Approved/Denied:   PASRR Number:    Discharge Plan: SNF    Current Diagnoses: Patient Active Problem List   Diagnosis Date Noted  . Acute delirium 07/21/2020  . Hypoxemia 07/21/2020  . 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 Left inguinal hernia s/p lap repair w mesh 07/18/2020 07/18/2020  . 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 Posterior vitreous detachment of left eye 02/25/2020  . Posterior vitreous detachment of right eye 02/25/2020  . Gallstones 02/12/2019  . Coronary artery calcification seen on CT scan 08/18/2018  . Hyperlipidemia 08/18/2018  . History of lung cancer 06/14/2018  . Osteopenia 06/14/2018  . Atherosclerosis of aorta ([place]) 05/24/2016  . Moderate COPD (chronic obstructive pulmonary disease), Sees Dr. Chase Caller, [facility] 05/21/2015  . Hypertension 06/22/2012    Orientation RESPIRATION BLADDER Height & Weight     Self  O2 Continent Weight: 119 lb 0.8 oz (54 kg) Height:  5' 2.01" (157.5 cm)  BEHAVIORAL SYMPTOMS/MOOD NEUROLOGICAL BOWEL NUTRITION STATUS      Continent Diet (Low Sodium Heart Healthy)  AMBULATORY STATUS COMMUNICATION OF NEEDS Skin   Extensive Assist Verbally Surgical wounds (Abdomen)                       Personal Care Assistance Level of Assistance   Bathing,Feeding,Dressing Bathing Assistance: Maximum assistance Feeding assistance: Independent Dressing Assistance: Maximum assistance     Functional Limitations Info  Sight,Hearing,Speech Sight Info: Adequate Hearing Info: Adequate Speech Info: Adequate    SPECIAL CARE FACTORS FREQUENCY  PT (By licensed PT),OT (By licensed OT)     PT Frequency: 5x/week OT Frequency: 5x/week            Contractures Contractures Info: Not present    Additional Factors Info  Code Status,Allergies,Psychotropic Code Status Info: Fullcode Allergies Info: Clindamycin/lincomycin, Penicillins, Spiriva (Tiotropium Bromide Monohydrate), Sulfa Antibiotics Psychotropic Info: Prozac         Current Medications (07/21/2020):  This is the current hospital active medication list Current Facility-Administered Medications  Medication Dose Route Frequency Provider Last Rate Last Admin  . 0.9 %  sodium chloride infusion  250 mL Intravenous PRN Michael Boston, MD      . acetaminophen (TYLENOL) tablet 650 mg  650 mg Oral Q6H PRN Michael Boston, MD      . albuterol (PROVENTIL) (2.5 MG/3ML) 0.083% nebulizer solution 2.5 mg  2.5 mg Nebulization Q4H PRN Patrecia Pour, MD      . amLODipine (NORVASC) tablet 5 mg  5 mg Oral Daily Michael Boston, MD   5 mg at 07/21/20 0942  . bisacodyl (DULCOLAX) suppository 10 mg  10 mg Rectal Daily PRN Michael Boston, MD      . cholecalciferol (VITAMIN D3) tablet 1,000 Units  1,000 Units Oral Daily Michael Boston, MD   1,000 Units at 07/21/20 [phone]  .  diphenhydrAMINE (BENADRYL) 12.5 MG/5ML elixir 12.5 mg  12.5 mg Oral Q6H PRN Michael Boston, MD       Or  . diphenhydrAMINE (BENADRYL) injection 12.5 mg  12.5 mg Intravenous Q6H PRN Michael Boston, MD      . enoxaparin (LOVENOX) injection 40 mg  40 mg Subcutaneous Q24H Michael Boston, MD   40 mg at 07/21/20 0941  . FLUoxetine (PROZAC) capsule 20 mg  20 mg Oral BID Michael Boston, MD   20 mg at 07/21/20 0942  . fluticasone furoate-vilanterol  (BREO ELLIPTA) 200-25 MCG/INH 1 puff  1 puff Inhalation Daily Michael Boston, MD   1 puff at 07/21/20 0744   And  . umeclidinium bromide (INCRUSE ELLIPTA) 62.5 MCG/INH 1 puff  1 puff Inhalation Daily Michael Boston, MD   1 puff at 07/21/20 0744  . guaiFENesin-dextromethorphan (ROBITUSSIN DM) 100-10 MG/5ML syrup 5 mL  5 mL Oral Q4H PRN Michael Boston, MD      . ipratropium-albuterol (DUONEB) 0.5-2.5 (3) MG/3ML nebulizer solution 3 mL  3 mL Nebulization Q6H Michael Boston, MD   3 mL at 07/21/20 1430  . magnesium hydroxide (MILK OF MAGNESIA) suspension 30 mL  30 mL Oral Daily PRN Michael Boston, MD      . methocarbamol (ROBAXIN) tablet 500 mg  500 mg Oral Q12H PRN Allie Bossier, MD      . metoprolol tartrate (LOPRESSOR) injection 5 mg  5 mg Intravenous Q6H PRN Michael Boston, MD      . phenol (CHLORASEPTIC) mouth spray 1 spray  1 spray Mouth/Throat Q2H PRN Michael Boston, MD      . polyethylene glycol (MIRALAX / GLYCOLAX) packet 17 g  17 g Oral BID Michael Boston, MD   17 g at 07/21/20 0942  . rosuvastatin (CRESTOR) tablet 5 mg  5 mg Oral Daily Michael Boston, MD   5 mg at 07/21/20 0942  . simethicone (MYLICON) chewable tablet 40 mg  40 mg Oral Q6H PRN Michael Boston, MD      . sodium chloride flush (NS) 0.9 % injection 3 mL  3 mL Intravenous Gorden Harms, MD   3 mL at 07/20/20 2207  . sodium chloride flush (NS) 0.9 % injection 3 mL  3 mL Intravenous PRN Michael Boston, MD      . traMADol Veatrice Bourbon) tablet 50 mg  50 mg Oral Q6H PRN Allie Bossier, MD      . vitamin B-12 (CYANOCOBALAMIN) tablet 1,000 mcg  1,000 mcg Oral Daily Michael Boston, MD   1,000 mcg at 07/21/20 8366     Discharge Medications: Please see discharge summary for a list of discharge medications.  Relevant Imaging Results:  Relevant Lab Results:   Additional Information QHU:[id number]  Lia Hopping, LCSW

## 2020-07-21 NOTE — Consult Note (Signed)
Medical Consultation   [name]  SFK:[id number]  DOB: 1941-05-15  DOA: 07/18/2020  PCP: Caren Macadam, MD    Requesting physician:   Reason for consultation: Multiple medical reasons  Covid vaccination; vaccinated   History of Present Illness: [name]  79 y.o. WF PMHx COPD, stage I lung CA s/p lobectomy chronic respiratory failure on home O2 noncompliant with its use partly because the concentrator is too loud, HTN, HLD, depression   who underwent hernia repair 07/18/2020 and later developed wheezing and hypoxia on POD #1 delaying discharge for which medicine is consulted.  The patient reports her wheezing was constant and moderate for her, came on gradually, was significantly improved with albuterol breathing treatments at 11am and ~4pm. She wishes to discharge as soon as possible. Says she has IS and oxygen at home that she doesn't regularly use, partly because the concentrator is too loud.   Carries diagnosis of moderate COPD by PFTs Feb 2017 (FEV 1 56%, FVC 87%, ratio 49, DLCO 43%. Moderate emphysema on imaging. Uses albuterol ~2 times per week. Has received covid vaccinations (x3) and flu vaccination this year. Has no sick contacts. Does have history of Pneumococcal pneumonia not requiring hospitalization years ago.  12/13 afebrile overnight A/O x1 (does not know where, when, why).  Patient is hallucinating seeing people that are not in the room.  Review of Systems: Denies fever, chills, headache, cough, sore throat, chest pain, palpitations, shortness of breath, abdominal pain, nausea, vomiting, changes in bowel habits, blood in stool, change in bladder habits, myalgias, arthralgias, rash, and per HPI. All others reviewed and are negative.  Covid vaccination; Review of Systems:  Review of Systems  Constitutional: Negative.   HENT: Negative.   Eyes: Negative.   Respiratory: Positive for shortness of breath. Negative for cough,  hemoptysis, sputum production and wheezing.   Cardiovascular: Negative.   Gastrointestinal: Negative.   Genitourinary: Negative.   Skin: Negative.   Psychiatric/Behavioral: Positive for hallucinations and memory loss.   As per HPI otherwise 10 point review of systems negative.    Past Medical History: Past Medical History:  Diagnosis Date  . Allergic rhinitis   . Anemia   . Anxiety   . Arthritis    all over  . Bilateral cataracts   . Chicken pox   . Cholelithiasis 05/21/2014   On CT scan 2015   . COPD (chronic obstructive pulmonary disease) ([place])   . Depression    hx ocd and anxiety in record as well  . Gout    last time 6-43yrs ago   . History of blood transfusion   . Hot flashes    takes Prozac daily  . Hx of migraines 90's   after menopause HA stopped  . Hyperlipidemia    taking Flax Seed Oil and Fish Oil  . Hypertension    takes Amlodipine nightly  . IBS (irritable bowel syndrome)   . Liver cyst   . Macrocytosis   . Osteopenia    takes Calcium and Vit D bid  . Pneumonia    x 2 ;last time back in 1999  . PONV (postoperative nausea and vomiting)   . 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 Seasonal allergies   . Smoking 09/12/2011    Past Surgical History: Past Surgical History:  Procedure Laterality Date  . CARPAL TUNNEL RELEASE  80's   right   . COLONOSCOPY    . cyst removed  >52yrs ago   from left leg  . DILATION AND CURETTAGE OF UTERUS     at age 23  . EXCISIONAL TOTAL KNEE ARTHROPLASTY Right 06/03/2014   Procedure: RIGHT KNEE SCAR EXCISION;  Surgeon: [name], MD;  Location: [facility];  Service: Orthopedics;  Laterality: Right;  . INGUINAL HERNIA REPAIR  2000  . ROTATOR CUFF REPAIR  2001   right  . TONSILLECTOMY  as a child   and adenoids  . TOTAL ABDOMINAL HYSTERECTOMY  2003   benign tumor-[facility]  . TOTAL HIP ARTHROPLASTY  2011   left  . TOTAL HIP ARTHROPLASTY  03/07/2012   Procedure: TOTAL HIP  ARTHROPLASTY ANTERIOR APPROACH;  Surgeon: [name], MD;  Location: [facility];  Service: Orthopedics;  Laterality: Right;  . TOTAL KNEE ARTHROPLASTY Right 01/02/2013   Procedure: RIGHT TOTAL KNEE ARTHROPLASTY;  Surgeon: [name], MD;  Location: [facility];  Service: Orthopedics;  Laterality: Right;  . TOTAL KNEE ARTHROPLASTY Left 04/03/2013   Procedure: LEFT TOTAL KNEE ARTHROPLASTY;  Surgeon: [name], MD;  Location: [facility];  Service: Orthopedics;  Laterality: Left;  Marland Kitchen VIDEO BRONCHOSCOPY  10/20/2011   Procedure: VIDEO BRONCHOSCOPY;  Surgeon: Grace Isaac, MD;  Location: [place];  Service: Thoracic;  Laterality: N/A;  . WEDGE RESECTION  10-20-2011   rt lung  - for lung cancer     Allergies:   Allergies  Allergen Reactions  . Clindamycin/Lincomycin Nausea And Vomiting  . Penicillins Rash       . Spiriva [Tiotropium Bromide Monohydrate]     General intolerance  . Sulfa Antibiotics Hives and Rash    Severe Hives with skin reaction     Social History:  reports that she quit smoking about 8 years ago. Her smoking use included cigarettes. She has a 25.00 pack-year smoking history. She has never used smokeless tobacco. She reports current alcohol use. She reports that she does not use drugs.   Family History: Family History  Problem Relation Age of Onset  . Other Mother        polyarteritis  . Bowel Disease Father 45       colectomy/bowel obstruction issues  . Heart attack Father   . Hypertension Brother   . Arthritis Brother   . Post-traumatic stress disorder Brother   . Alcohol abuse Other   . Depression Other   . Hearing loss Other   . Heart disease Other   . Hypertension Other   . Anesthesia problems Neg Hx   . Hypotension Neg Hx   . Malignant hyperthermia Neg Hx   . Pseudochol deficiency Neg Hx   . Breast cancer Neg Hx      I have personally reviewed and interpreted all radiology studies and my findings are as above.  VENTILATOR SETTINGS: Aerosol mask  12/13 Flow; 2 L/min SPO2 93%  Cultures   Antimicrobials: Anti-infectives (From admission, onward)   Start     Ordered Stop   07/18/20 0600  clindamycin (CLEOCIN) IVPB 900 mg       "And" Linked Group Details   07/17/20 0945 07/18/20 0918   07/18/20 0600  gentamicin (GARAMYCIN) 270 mg in dextrose 5 % 100 mL IVPB       "And" Linked Group Details   07/17/20 0945 07/18/20 0935       Devices    LINES / TUBES:      Continuous  Infusions: . sodium chloride       Physical Exam: Vitals:   07/20/20 2039 07/20/20 2040 07/21/20 0550 07/21/20 0744  BP: (!) 157/102  (!) 150/139   Pulse: (!) 102  (!) 105   Resp: 17  16   Temp:   98.5 F (36.9 C)   TempSrc:   Oral   SpO2: 95% 94% 100% 93%  Weight:      Height:        General: A/O x1 (does not know where, when, why) positive chronic respiratory distress Eyes: negative scleral hemorrhage, negative anisocoria, negative icterus ENT: Negative Runny nose, negative gingival bleeding, Neck:  Negative scars, masses, torticollis, lymphadenopathy, JVD Lungs: decreased breath sounds bilaterally consistent with emphysema and lobectomy, bilaterally without wheezes or crackles Cardiovascular: Regular rate and rhythm without murmur gallop or rub normal S1 and S2 Abdomen: negative abdominal pain, nondistended, positive soft, bowel sounds, no rebound, no ascites, no appreciable mass, binder in place did not remove Extremities: No significant cyanosis, clubbing, or edema bilateral lower extremities Skin: Negative rashes, lesions, ulcers Psychiatric:  Negative depression, negative anxiety, negative fatigue, negative mania  Central nervous system:  Cranial nerves II through XII intact, tongue/uvula midline, all extremities muscle strength 5/5, sensation intact throughout,  negative dysarthria, negative expressive aphasia, negative receptive aphasia.  Data reviewed:  I have personally reviewed following labs and imaging studies Labs:   CBC: Recent Labs  Lab 07/16/20 1500 07/19/20 0542 07/21/20 0455  WBC 6.0 8.6 11.9*  NEUTROABS  --   --  10.9*  HGB 15.0 12.4 11.7*  HCT 43.8 37.1 34.3*  MCV 96.7 98.4 96.3  PLT 200 156 970    Basic Metabolic Panel: Recent Labs  Lab 07/16/20 1500 07/19/20 0542 07/20/20 0510 07/21/20 0455  NA 134* 133* 132* 133*  K 4.0 4.5 3.8 3.4*  CL 100 100 97* 95*  CO2 26 23 26 26   GLUCOSE 85 96 110* 129*  BUN 9 14 10 14   CREATININE 0.93 1.06* 0.81 0.78  CALCIUM 9.6 8.7* 8.7* 8.6*  MG  --  2.1  --  1.9  PHOS  --   --  2.5 2.7   GFR Estimated Creatinine Clearance: 46.6 mL/min (by C-G formula based on SCr of 0.78 mg/dL). Liver Function Tests: Recent Labs  Lab 07/20/20 0510 07/21/20 0455  AST  --  48*  ALT  --  31  ALKPHOS  --  39  BILITOT  --  1.7*  PROT  --  5.8*  ALBUMIN 3.8 3.6   No results for input(s): LIPASE, AMYLASE in the last 168 hours. No results for input(s): AMMONIA in the last 168 hours. Coagulation profile No results for input(s): INR, PROTIME in the last 168 hours.  Cardiac Enzymes: No results for input(s): CKTOTAL, CKMB, CKMBINDEX, TROPONINI in the last 168 hours. BNP: Invalid input(s): POCBNP CBG: No results for input(s): GLUCAP in the last 168 hours. D-Dimer No results for input(s): DDIMER in the last 72 hours. Hgb A1c No results for input(s): HGBA1C in the last 72 hours. Lipid Profile No results for input(s): CHOL, HDL, LDLCALC, TRIG, CHOLHDL, LDLDIRECT in the last 72 hours. Thyroid function studies No results for input(s): TSH, T4TOTAL, T3FREE, THYROIDAB in the last 72 hours.  Invalid input(s): FREET3 Anemia work up No results for input(s): VITAMINB12, FOLATE, FERRITIN, TIBC, IRON, RETICCTPCT in the last 72 hours. Urinalysis    Component Value Date/Time   COLORURINE AMBER (A) 05/27/2014 1444   APPEARANCEUR CLOUDY (A) 05/27/2014 1444  LABSPEC 1.025 05/27/2014 1444   PHURINE 5.0 05/27/2014 1444   GLUCOSEU NEGATIVE 05/27/2014 1444    HGBUR NEGATIVE 05/27/2014 1444   BILIRUBINUR NEGATIVE 05/27/2014 1444   KETONESUR NEGATIVE 05/27/2014 1444   PROTEINUR NEGATIVE 05/27/2014 1444   UROBILINOGEN 0.2 05/27/2014 1444   NITRITE NEGATIVE 05/27/2014 1444   LEUKOCYTESUR SMALL (A) 05/27/2014 1444     Microbiology Recent Results (from the past 240 hour(s))  SARS CORONAVIRUS 2 (TAT 6-24 HRS) Nasopharyngeal Nasopharyngeal Swab     Status: None   Collection Time: 07/15/20 10:43 AM   Specimen: Nasopharyngeal Swab  Result Value Ref Range Status   SARS Coronavirus 2 NEGATIVE NEGATIVE Final    Comment: (NOTE) SARS-CoV-2 target nucleic acids are NOT DETECTED.  The SARS-CoV-2 RNA is generally detectable in upper and lower respiratory specimens during the acute phase of infection. Negative results do not preclude SARS-CoV-2 infection, do not rule out co-infections with other pathogens, and should not be used as the sole basis for treatment or other patient management decisions. Negative results must be combined with clinical observations, patient history, and epidemiological information. The expected result is Negative.  Fact Sheet for Patients: SugarRoll.be  Fact Sheet for Healthcare Providers: https://www.-mathews.com/  This test is not yet approved or cleared by the [place] FDA and  has been authorized for detection and/or diagnosis of SARS-CoV-2 by FDA under an Emergency Use Authorization (EUA). This EUA will remain  in effect (meaning this test can be used) for the duration of the COVID-19 declaration under Se ction 564(b)(1) of the Act, 21 U.S.C. section 360bbb-3(b)(1), unless the authorization is terminated or revoked sooner.  Performed at [facility], [place] [address] [place], [place] [address]        Inpatient Medications:   Scheduled Meds: . acetaminophen  1,000 mg Oral Q6H  . amLODipine  5 mg Oral Daily  . cholecalciferol  1,000 Units Oral Daily   . enoxaparin (LOVENOX) injection  40 mg Subcutaneous Q24H  . FLUoxetine  20 mg Oral BID  . fluticasone furoate-vilanterol  1 puff Inhalation Daily   And  . umeclidinium bromide  1 puff Inhalation Daily  . ipratropium-albuterol  3 mL Nebulization Q6H  . rosuvastatin  5 mg Oral Daily  . sodium chloride flush  3 mL Intravenous Q12H  . vitamin B-12  1,000 mcg Oral Daily   Continuous Infusions: . sodium chloride       Radiological Exams on Admission: DG CHEST PORT 1 VIEW  Result Date: 07/20/2020 CLINICAL DATA:  Post hernia repair.  Decreased oxygen saturations EXAM: PORTABLE CHEST 1 VIEW COMPARISON:  07/19/2020 FINDINGS: Normal cardiac silhouette. Emphysema the upper lobes. Chronic calcified nodule in the RIGHT upper lobe. No pneumothorax. No infiltrate. No pulmonary edema IMPRESSION: No acute cardiopulmonary process.  Emphysema Electronically Signed   By: Suzy Bouchard M.D.   On: 07/20/2020 09:23   DG CHEST PORT 1 VIEW  Result Date: 07/19/2020 CLINICAL DATA:  Wheezing. EXAM: PORTABLE CHEST 1 VIEW COMPARISON:  April 21, 2015. FINDINGS: The heart size and mediastinal contours are within normal limits. No pneumothorax or pleural effusion is noted. Left lung is clear. Stable postoperative changes seen laterally in the right midlung. New right basilar opacity is noted concerning for subsegmental atelectasis or possibly pneumonia. The visualized skeletal structures are unremarkable. IMPRESSION: New right basilar opacity is noted concerning for subsegmental atelectasis or possibly pneumonia. Electronically Signed   By: Marijo Conception M.D.   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  Patient had removed her O2.  Become confused,  trying to get out of the bed, not making sense to the ED staff. -Patient placed back on 2 L O2 nasal cannula and encephalopathy resolved. -Counseled patient at length that given her significant lung disease she had ZERO reserve, to remove her oxygen during the day or at night.  Patient will require O2 24 hours a day. -12/13 CT head pending; R/O CVA -12/13 blood cultures, urine culture pending  Acute on chronic respiratory failure with hypoxia/COPD Gold II - Primarily suspect postoperative atelectasis based on CXR finding in absence of fever, new cough, etc. Fluid overload may have contributed which was treated with lasix x1. Underlying COPD seems to have made it easier to to tip into respiratory failure, and may also be slightly exacerbated.  - Encourage use of incentive spirometry, instructed on its use. Pt has close friend at bedside who will continue to enforce this strong recommendation.  - OOB as much as possible. - Add duonebs scheduled, albuterol prn - Continue maintenance bronchodilators - Avoid steroids postoperatively unless wheezing recurs despite inhaled Tx. Pt does not regularly require prednisone as outpatient.  - Supplement oxygen 24 hours a day to minimize long term pulmonary HTN, and episodes of acute encephalopathy.  -12/2 ambulatory SPO2 pending. - Follow up with pulmonary after DC, Dr. Chase Caller - Maintain VTE ppx - If fever develops, strongly consider repeat CXR and coverage for aspiration PNA. This does not appear likely at this time. -12/13 decrease Robaxin 500 mg BID PRN -12/13 decrease tramadol 50 mg QID PRN -Incentive spirometry -Flutter valve -DuoNeb QID  History of stage I lung CA  -S/p lobectomy 2013 by Dr. Servando Snare and followed by Dr. Julien Nordmann under surveillance with no known evidence of recurrence.   Stage IIIb CKD:  - Monitor Cr and electrolytes in AM following lasix. Lab Results  Component Value Date   CREATININE 0.78 07/21/2020   CREATININE 0.81  07/20/2020   CREATININE 1.06 (H) 07/19/2020   CREATININE 0.93 07/16/2020   CREATININE 0.81 12/03/2019  -Back to normal.   S/p Llaparoscopic bilateral spigelian hernias and umbilical hernia repair with mesh 07/18/2020 by Dr. Johney Maine:  - Per primary.  - Minimize narcotics as able  Hypokalemia -Potassium goal> 4 -K-Dur 50 mEq x 1  Goals of care -12/13 PT/OT consult;Evaluate for CIR vs SNF   Thank you for this consultation.  Our [facility] hospitalist team will follow the patient with you.   Time Spent: 35 min  Dannielle Baskins J M.D. [facility] 07/21/2020, 7:49 AM  [phone]

## 2020-07-21 NOTE — Progress Notes (Deleted)
[facility] Progress Note Patient Name: [name] DOB: 07/02/1941 MRN: [id number]   Date of Service  07/21/2020  HPI/Events of Note  Hypoxemia -Sats decreasing to 86-87% on 15 L/min [place] O2.   eICU Interventions  Plan: 1. Trial of BiPAP. 2. Portable CXR STAT     Intervention Category Major Interventions: Hypoxemia - evaluation and management  Harli Engelken [place] 07/21/2020, 10:19 PM

## 2020-07-21 NOTE — Evaluation (Signed)
Physical Therapy Evaluation Patient Details Name: [name] MRN: [id number] DOB: 07-28-1941 Today's  Date: 07/21/2020   History of Present Illness  79 yo female s/p hernia repair with mesh 07/18/20. Pt also sustained a fall 12/12 in hospital. Hx of COPD, lung ca s/p lobectomy, osteopenia, depression, R TKA 2014  Clinical Impression  On eval, pt required Min assist for mobility. She walked ~60 feet with use of a RW. Ambulation distance was limited by dyspnea and fatigue. Dyspnea 3/4 + audible wheezing with ambulation. Pt is confused-A&Ox1. She is pleasant and follows 1 step commands well with time. Her long time friend was present during session and she stated that pt's current presentation is quite different from her baseline. Will plan to follow and progress activity as tolerated. Recommendation is for ST rehab SNF at this time.     Follow Up Recommendations SNF    Equipment Recommendations  None recommended by PT    Recommendations for Other Services       Precautions / Restrictions Precautions Precautions: Fall Precaution Comments: abd sg Restrictions Weight Bearing Restrictions: No      Mobility  Bed Mobility Overal bed mobility: Needs Assistance Bed Mobility: Supine to Sit     Supine to sit: Min assist     General bed mobility comments: Multimodal and repeated cueing required. Assist for LEs and trunk. Increased time.    Transfers Overall transfer level: Needs assistance Equipment used: Rolling walker (2 wheeled) Transfers: Sit to/from Stand Sit to Stand: Min assist         General transfer comment: Assist to rise, stabilize, control descent. Initially with posterior bias requiring assistance to prevent LOB. Multimodal cueing required.  Ambulation/Gait Ambulation/Gait assistance: Min assist Gait Distance (Feet): 60 Feet Assistive device: Rolling walker (2 wheeled) Gait Pattern/deviations: Step-through pattern;Decreased stride length      General Gait Details: Assist to stabilize pt and manage with RW. Cues for safety. Distance limited by fatigue. Dyspnea 3/4 + audible wheezing. O2 92% on RA  Stairs            Wheelchair Mobility    Modified Rankin (Stroke Patients Only)       Balance Overall balance assessment: Needs assistance;History of Falls         Standing balance support: Bilateral upper extremity supported Standing balance-Leahy Scale: Poor                               Pertinent Vitals/Pain Pain Assessment: Faces Faces Pain Scale: No hurt    Home Living Family/patient expects to be discharged to:: Unsure Living Arrangements: Spouse/significant other Available Help at Discharge: Friend(s);Available PRN/intermittently           Home Equipment: None      Prior Function Level of Independence: Independent         Comments: per friend, pt was taking care of husband at home     Hand Dominance        Extremity/Trunk Assessment   Upper Extremity Assessment Upper Extremity Assessment: Generalized weakness    Lower Extremity Assessment Lower Extremity Assessment: Generalized weakness    Cervical / Trunk Assessment Cervical / Trunk Assessment: Normal  Communication   Communication: No difficulties  Cognition   Behavior During Therapy: Restless Overall Cognitive Status: Impaired/Different from baseline Area of Impairment: Orientation;Attention;Memory;Following commands;Safety/judgement;Problem solving                 Orientation Level: Disoriented to;Place;Time;Situation Current  Attention Level: Focused Memory: Decreased short-term memory;Decreased recall of precautions Following Commands: Follows one step commands inconsistently Safety/Judgement: Decreased awareness of safety;Decreased awareness of deficits   Problem Solving: Requires tactile cues;Requires verbal cues        General Comments      Exercises     Assessment/Plan    PT  Assessment Patient needs continued PT services  PT Problem List Decreased strength;Decreased mobility;Decreased activity tolerance;Decreased balance;Decreased cognition;Decreased safety awareness;Decreased knowledge of use of DME       PT Treatment Interventions DME instruction;Gait training;Therapeutic activities;Therapeutic exercise;Patient/family education;Balance training;Functional mobility training    PT Goals (Current goals can be found in the Care Plan section)  Acute Rehab PT Goals Patient Stated Goal: pt unable to state. PT Goal Formulation: Patient unable to participate in goal setting Time For Goal Achievement: 08/04/20 Potential to Achieve Goals: Good    Frequency Min 2X/week   Barriers to discharge        Co-evaluation               AM-PAC PT "6 Clicks" Mobility  Outcome Measure Help needed turning from your back to your side while in a flat bed without using bedrails?: A Little Help needed moving from lying on your back to sitting on the side of a flat bed without using bedrails?: A Little Help needed moving to and from a bed to a chair (including a wheelchair)?: A Little Help needed standing up from a chair using your arms (e.g., wheelchair or bedside chair)?: A Little Help needed to walk in hospital room?: A Little Help needed climbing 3-5 steps with a railing? : A Lot 6 Click Score: 17    End of Session Equipment Utilized During Treatment: Gait belt Activity Tolerance: Patient tolerated treatment well Patient left: in bed;with call bell/phone within reach;with bed alarm set;with restraints reapplied;with family/visitor present   PT Visit Diagnosis: History of falling (Z91.81);Muscle weakness (generalized) (M62.81);Unsteadiness on feet (R26.81)    Time: 7322-0254 PT Time Calculation (min) (ACUTE ONLY): 29 min   Charges:   PT Evaluation $PT Eval Moderate Complexity: 1 Mod PT Treatments $Gait Training: 8-22 mins           Doreatha Massed, PT Acute  Rehabilitation  Office: [phone] Pager: [phone]

## 2020-07-21 NOTE — TOC Initial Note (Addendum)
Transition of Care [facility]) - Initial/Assessment Note    Patient Details  Name: [name] MRN: [id number] Date of Birth: 1941-01-06  Transition of Care [facility]) CM/SW Contact:    Lia Hopping, LCSW Phone Number: 07/21/2020, 2:14 PM  Clinical Narrative:  Re:SNF placement for rehab                 Patient oriented to self. Patient friend Milas Kocher at bedside. Per Izora Gala, this is not the patient baseline. 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 She reports the patient is independent with her ADLs, still drives and uses a cane to ambulate occasionally. Izora Gala reports the patient will in fact need to go to rehab facility until she is well enough to go home.  CSW reached out to [place] [facility] to discuss rehab placement.She reports the patient has a daughter in [place] [phone] who may have HCPOA paperwork however this has not been confirmed. CSW explain the SNF process and will later follow up with bed offers.  CSW explain barriers to discharge-pt. Will need to be sitter/restraint free for 24 hours.   FL2 completed. Physician please co-sign.   Please sign 30 day note on the pt. shadow chart. Thanks   CSW will follow up w/ bed offers.    Expected Discharge Plan: Skilled Nursing Facility Barriers to Discharge: Continued Medical Work up,Requiring sitter/restraints   Patient Goals and CMS Choice Patient states their goals for this hospitalization and ongoing recovery are:: Per Sheliah Hatch: to go to rehab before returning home CMS Medicare.gov Compare Post Acute Care list provided to:: Patient Represenative (must comment) Choice offered to / list presented to :  Sheliah Hatch)  Expected Discharge Plan and Services Expected Discharge Plan: [place] In-house Referral: Clinical Social Work   Post Acute Care Choice: [place] Living arrangements for the past 2 months:  [place] Expected Discharge Date: 07/19/20                                    Prior Living Arrangements/Services Living arrangements for the past 2 months: Single Family Home Lives with:: Spouse Patient language and need for interpreter reviewed:: Yes Do you feel safe going back to the place where you live?: Yes      Need for Family Participation in Patient Care: Yes (Comment) Care giver support system in place?: No (comment) Current home services: DME Criminal Activity/Legal Involvement Pertinent to Current Situation/Hospitalization: No - Comment as needed  Activities of Daily Living Home Assistive Devices/Equipment: Cane (specify quad or straight) ADL Screening (condition at time of admission) Patient's cognitive ability adequate to safely complete daily activities?: Yes Is the patient deaf or have difficulty hearing?: No Does the patient have difficulty seeing, even when wearing glasses/contacts?: No Does the patient have difficulty concentrating, remembering, or making decisions?: No Patient able to express need for assistance with ADLs?: Yes Does the patient have difficulty dressing or bathing?: No Independently performs ADLs?: Yes (appropriate for developmental age) Does the patient have difficulty walking or climbing stairs?: No Weakness of Legs: None Weakness of Arms/Hands: None  Permission Sought/Granted Permission sought to share information with : Family Supports                Emotional Assessment Appearance:: Appears stated age     Orientation: : Fluctuating Orientation (Suspected and/or reported Sundowners) Alcohol /  Substance Use: Not Applicable Psych Involvement: No (comment)  Admission diagnosis:  Spigelian hernia [K43.9] Patient Active Problem List   Diagnosis Date Noted  . Acute delirium 07/21/2020  . Hypoxemia 07/21/2020  . 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 Left inguinal hernia s/p lap repair w  mesh 07/18/2020 07/18/2020  . 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 Posterior vitreous detachment of left eye 02/25/2020  . Posterior vitreous detachment of right eye 02/25/2020  . Gallstones 02/12/2019  . Coronary artery calcification seen on CT scan 08/18/2018  . Hyperlipidemia 08/18/2018  . History of lung cancer 06/14/2018  . Osteopenia 06/14/2018  . Atherosclerosis of aorta ([place]) 05/24/2016  . Moderate COPD (chronic obstructive pulmonary disease), Sees Dr. Chase Caller, [facility] 05/21/2015  . Hypertension 06/22/2012   PCP:  Caren Macadam, MD Pharmacy:   [facility] [address], [place] - [address] [place] [address] Phone: [phone] Fax: [phone]     Social Determinants of Health (SDOH) Interventions    Readmission Risk Interventions No flowsheet data found.

## 2020-07-21 NOTE — Progress Notes (Signed)
[name] [id number] 10-09-1940  CARE TEAM:  PCP: Caren Macadam, MD  Outpatient Care Team: Patient Care Team: Caren Macadam, MD as PCP - General (Family Medicine) Brand Males, MD (Pulmonary Disease) Grace Isaac, MD (Cardiothoracic Surgery) Lorretta Harp, MD as Consulting Physician (Cardiology) Curt Bears, MD as Consulting Physician (Hematology and Oncology) Michael Boston, MD as Consulting Physician (General Surgery)  Inpatient Treatment Team: Treatment Team: Attending Provider: Michael Boston, MD; Consulting Physician: Patrecia Pour, MD; Rounding Team: Fatima Blank, MD; Technician: Ernest Mallick, NT; Technician: Karna Christmas, NT; Utilization Review: Fortino Sic, RN; Registered Nurse: Guadlupe Spanish, RN; Physical Therapist: Alphonzo Severance, PT; Rounding Team: Pccm, Md, MD   Problem List:   Principal Problem:   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  Left side incarcerated with preperitoneal fat and some omentum.  Obvious indirect left inguinal hernia of moderate size.  No definite recurrent right inguinal hernia.  Umbilical hernia   Assessment  Hypoxia on oxygen  Mild/modertae delirium  [facility] Stay = 1 days)  Plan:  -Medicine help appreciated -Consult pulmonary to re-check COPD status.  Discussed with Dr. Lake Bells in the ICU/stepdown.  The team will come by to see if any medicines other interventions adjusted.  Sounds like the patient  actually does have oxygen at home just does not like to use it.  Increased confusion suspicious for delirium.  No major metabolic or anemic etiology.  CT of head pending per medicine recommendation.  Suspect she is having some sundowning issues.  Hopefully can recalibrate with a more regular routine.  Mild hypokalemia - correcting  Sitter and restraints for now.  Enventually try and remobilize.  Moderate ecchymosis in the flank and groins not surprising with the repairs intact.  Stable to resolving.  VTE prophylaxis- SCDs, etc  Appreciate input from medicine and will and pulmonary to make sure were not missing anything.  I am skeptical patient can safely go home.  Hopefully with careful support and monitoring, can mobilize and get her into a more active daytime routine and break the cycle of sundowning and confusion.  Make sure were not missing any organic or hypoxic etiology for her delirium.  Disposition:  Disposition:  The patient is from: Home  Anticipate discharge to:  [place]  Anticipated Date of Discharge is:  July 23, 2020  Barriers to discharge:  Pending Clinical improvement (more likely than not  Patient currently is NOT MEDICALLY STABLE for discharge from the hospital from a surgery standpoint.   35 minutes spent in review, evaluation, examination, counseling, and coordination of care.  More than 50% of that time was spent in counseling.  07/21/2020    Subjective: (Chief complaint)  Patient with worsening confusion at night.  Trying to get out of bed.  Apparently slid to floor last night.  No complaints of any injury pain or weakness.  Friend at bedside.  Patient quite talkative and somewhat rambling.  Objective:  Vital signs:  Vitals:  07/20/20 2039 07/20/20 2040 07/21/20 0550 07/21/20 0744  BP: (!) 157/102  (!) 150/139   Pulse: (!) 102  (!) 105   Resp: 17  16   Temp:   98.5 F (36.9 C)   TempSrc:   Oral   SpO2: 95% 94% 100%  93%  Weight:      Height:        Last BM Date: 07/19/20  Intake/Output   Yesterday:  12/12 0701 - 12/13 0700 In: 960 [P.O.:960] Out: 500 [Urine:500] This shift:  No intake/output data recorded.  Bowel function:  Flatus: YES  BM:  No  Drain: (No drain)   Physical Exam:  General: Pt awake/alert in no acute distress.  Smiling.  Chatty.  Mildly restless in bed Eyes: PERRL, normal EOM.  Sclera clear.  No icterus.  Atraumatic abrasions or hematoma Neuro: CN II-XII intact w/o focal sensory/motor deficits. Lymph: No head/neck/groin lymphadenopathy Psych: Somewhat wrangling and talkative.  Answer some questions.  Friend at bedside concerned that this is not her baseline mental status.  Oriented x 3.  Will answer questions and follow commands. HENT: Normocephalic, Mucus membranes moist.  No thrush Neck: Supple, No tracheal deviation.  No obvious thyromegaly Chest: No pain to chest wall compression.  Good respiratory excursion.  No audible wheezing CV:  Pulses intact.  Regular rhythm.  No major extremity edema MS: Normal AROM mjr joints.  No obvious deformity  Abdomen: Soft.  Mildy distended.  Nontender.  I removed laparoscopic dressings.  Moderate ecchymosis on flank and groins.  No evidence of peritonitis.  No incarcerated hernias.  Abdominal binder loosely on and crawling up chest.  I removed.  Posey in place  Ext:  No deformity.  No mjr edema.  No cyanosis Skin: No petechiae / purpurea.  No major sores.  Warm and dry    Results:   Cultures: Recent Results (from the past 720 hour(s))  SARS CORONAVIRUS 2 (TAT 6-24 HRS) Nasopharyngeal Nasopharyngeal Swab     Status: None   Collection Time: 07/15/20 10:43 AM   Specimen: Nasopharyngeal Swab  Result Value Ref Range Status   SARS Coronavirus 2 NEGATIVE NEGATIVE Final    Comment: (NOTE) SARS-CoV-2 target nucleic acids are NOT DETECTED.  The SARS-CoV-2 RNA is generally detectable in upper and lower respiratory specimens during  the acute phase of infection. Negative results do not preclude SARS-CoV-2 infection, do not rule out co-infections with other pathogens, and should not be used as the sole basis for treatment or other patient management decisions. Negative results must be combined with clinical observations, patient history, and epidemiological information. The expected result is Negative.  Fact Sheet for Patients: SugarRoll.be  Fact Sheet for Healthcare Providers: https://www.woods-mathews.com/  This test is not yet approved or cleared by the [place] FDA and  has been authorized for detection and/or diagnosis of SARS-CoV-2 by FDA under an Emergency Use Authorization (EUA). This EUA will remain  in effect (meaning this test can be used) for the duration of the COVID-19 declaration under Se ction 564(b)(1) of the Act, 21 U.S.C. section 360bbb-3(b)(1), unless the authorization is terminated or revoked sooner.  Performed at [facility], [place] [address], [place], [place] [address]     Labs: Results for orders placed or performed during the hospital encounter of 07/18/20 (from the past 48 hour(s))  Renal function panel     Status: Abnormal   Collection Time: 07/20/20  5:10 AM  Result Value Ref Range   Sodium 132 (L) 135 -  145 mmol/L   Potassium 3.8 3.5 - 5.1 mmol/L   Chloride 97 (L) 98 - 111 mmol/L   CO2 26 22 - 32 mmol/L   Glucose, Bld 110 (H) 70 - 99 mg/dL    Comment: Glucose reference range applies only to samples taken after fasting for at least 8 hours.   BUN 10 8 - 23 mg/dL   Creatinine, Ser 0.81 0.44 - 1.00 mg/dL   Calcium 8.7 (L) 8.9 - 10.3 mg/dL   Phosphorus 2.5 2.5 - 4.6 mg/dL   Albumin 3.8 3.5 - 5.0 g/dL   GFR, Estimated >60 >60 mL/min    Comment: (NOTE) Calculated using the CKD-EPI Creatinine Equation (2021)    Anion gap 9 5 - 15    Comment: Performed at [facility], [place] [address], [place], [place]  [address]  Blood gas, arterial     Status: Abnormal   Collection Time: 07/20/20  8:17 AM  Result Value Ref Range   pH, Arterial 7.454 (H) 7.350 - 7.450   pCO2 arterial 37.9 32.0 - 48.0 mmHg   pO2, Arterial 57.4 (L) 83.0 - 108.0 mmHg   Bicarbonate 26.2 20.0 - 28.0 mmol/L   Acid-Base Excess 2.8 (H) 0.0 - 2.0 mmol/L   O2 Saturation 91.2 %   Patient temperature 98.6     Comment: Performed at [facility], [place] [address], [place], [place] [address]  Comprehensive metabolic panel     Status: Abnormal   Collection Time: 07/21/20  4:55 AM  Result Value Ref Range   Sodium 133 (L) 135 - 145 mmol/L   Potassium 3.4 (L) 3.5 - 5.1 mmol/L   Chloride 95 (L) 98 - 111 mmol/L   CO2 26 22 - 32 mmol/L   Glucose, Bld 129 (H) 70 - 99 mg/dL    Comment: Glucose reference range applies only to samples taken after fasting for at least 8 hours.   BUN 14 8 - 23 mg/dL   Creatinine, Ser 0.78 0.44 - 1.00 mg/dL   Calcium 8.6 (L) 8.9 - 10.3 mg/dL   Total Protein 5.8 (L) 6.5 - 8.1 g/dL   Albumin 3.6 3.5 - 5.0 g/dL   AST 48 (H) 15 - 41 U/L   ALT 31 0 - 44 U/L   Alkaline Phosphatase 39 38 - 126 U/L   Total Bilirubin 1.7 (H) 0.3 - 1.2 mg/dL   GFR, Estimated >60 >60 mL/min    Comment: (NOTE) Calculated using the CKD-EPI Creatinine Equation (2021)    Anion gap 12 5 - 15    Comment: Performed at [facility], [place] [address] [place], [place] [address]  Magnesium     Status: None   Collection Time: 07/21/20  4:55 AM  Result Value Ref Range   Magnesium 1.9 1.7 - 2.4 mg/dL    Comment: Performed at [facility], [place] [address], [place], [place] [address]  Phosphorus     Status: None   Collection Time: 07/21/20  4:55 AM  Result Value Ref Range   Phosphorus 2.7 2.5 - 4.6 mg/dL    Comment: Performed at [facility], [place] [address], [place], [place] [address]  CBC with Differential/Platelet     Status: Abnormal   Collection Time: 07/21/20  4:55 AM   Result Value Ref Range   WBC 11.9 (H) 4.0 - 10.5 K/uL   RBC 3.56 (L) 3.87 - 5.11 MIL/uL   Hemoglobin 11.7 (L) 12.0 - 15.0 g/dL   HCT  34.3 (L) 36.0 - 46.0 %   MCV 96.3 80.0 - 100.0 fL   MCH 32.9 26.0 - 34.0 pg   MCHC 34.1 30.0 - 36.0 g/dL   RDW 11.9 11.5 - 15.5 %   Platelets 173 150 - 400 K/uL   nRBC 0.0 0.0 - 0.2 %   Neutrophils Relative % 92 %   Neutro Abs 10.9 (H) 1.7 - 7.7 K/uL   Lymphocytes Relative 4 %   Lymphs Abs 0.5 (L) 0.7 - 4.0 K/uL   Monocytes Relative 4 %   Monocytes Absolute 0.4 0.1 - 1.0 K/uL   Eosinophils Relative 0 %   Eosinophils Absolute 0.0 0.0 - 0.5 K/uL   Basophils Relative 0 %   Basophils Absolute 0.0 0.0 - 0.1 K/uL   Immature Granulocytes 0 %   Abs Immature Granulocytes 0.05 0.00 - 0.07 K/uL    Comment: Performed at [facility], [place] [address], [place],  [place] [address]    Imaging / Studies: DG CHEST PORT 1 VIEW  Result Date: 07/20/2020 CLINICAL DATA:  Post hernia repair.  Decreased oxygen saturations EXAM: PORTABLE CHEST 1 VIEW COMPARISON:  07/19/2020 FINDINGS: Normal cardiac silhouette. Emphysema the upper lobes. Chronic calcified nodule in the RIGHT upper lobe. No pneumothorax. No infiltrate. No pulmonary edema IMPRESSION: No acute cardiopulmonary process.  Emphysema Electronically Signed   By: Suzy Bouchard M.D.   On: 07/20/2020 09:23   DG CHEST PORT 1 VIEW  Result Date: 07/19/2020 CLINICAL DATA:  Wheezing. EXAM: PORTABLE CHEST 1 VIEW COMPARISON:  April 21, 2015. FINDINGS: The heart size and mediastinal contours are within normal limits. No pneumothorax or pleural effusion is noted. Left lung is clear. Stable postoperative changes seen laterally in the right midlung. New right basilar opacity is noted concerning for subsegmental atelectasis or possibly pneumonia. The visualized skeletal structures are unremarkable. IMPRESSION: New right basilar opacity is noted concerning for subsegmental atelectasis or possibly pneumonia.  Electronically Signed   By: Marijo Conception M.D.   On: 07/19/2020 15:54    Medications / Allergies: per chart  Antibiotics: Anti-infectives (From admission, onward)   Start     Dose/Rate Route Frequency Ordered Stop   07/18/20 0600  clindamycin (CLEOCIN) IVPB 900 mg       "And" Linked Group Details   900 mg 100 mL/hr over 30 Minutes Intravenous On call to O.R. 07/17/20 0945 07/18/20 0918   07/18/20 0600  gentamicin (GARAMYCIN) 270 mg in dextrose 5 % 100 mL IVPB       "And" Linked Group Details   5 mg/kg  54.9 kg 213.5 mL/hr over 30 Minutes Intravenous On call to O.R. 07/17/20 0945 07/18/20 0935        Note: Portions of this report may have been transcribed using voice recognition software. Every effort was made to ensure accuracy; however, inadvertent computerized transcription errors may be present.   Any transcriptional errors that result from this process are unintentional.    Adin Hector, MD, FACS, MASCRS Gastrointestinal and Minimally Invasive Surgery  [facility] [address], [place],  [address] [phone] Fax [phone] Main/Paging  CONTACT INFORMATION: Weekday (9AM-5PM) concerns: Call [facility] main office at [phone] Weeknight (5PM-9AM) or Weekend/Holiday concerns: Check [email or web address] for General Surgery [facility] coverage (Please, do not use SecureChat as it is not reliable communication to operating surgeons for immediate patient care)      07/21/2020  8:25 AM

## 2020-07-21 NOTE — Consult Note (Addendum)
NAME:  [name], MRN:  [id number], DOB:  Nov 02, 1940, LOS: 1 ADMISSION DATE:  07/18/2020, CONSULTATION DATE:  07/21/2020 REFERRING MD:  Dr. Johney Maine , CHIEF COMPLAINT:  COPD   History of present illness   79 year old underwent hernia repair 12/10. POD 1 with Hypoxia, Wheezing, and confusion. Non-Compliant with home oxygen. 12/13 Pulmonary Consulted.   Patient follows Dr. Chase Caller. Last appointment 04/15/2020. Prescribed Triple Combination Inhaler and as needed Proair. Patient reports wearing oxygen as needed. states that when she feels short of breath she will use inhaler or put her oxygen on. Reports Tobacco use, quiet "years" ago. Has non-productive cough. Feels breathing has improved however still wheezy. Was able to ambulate today. Able to speak multiple sentences without pause.   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 No acute process  Micro Data:    Antimicrobials:     Interim history/subjective:  As above.   Objective   Blood pressure 120/83, pulse (!) 108, temperature 99.3 F (37.4 C), temperature source Oral, resp. rate 16, height 5' 2.01" (1.575 m), weight 54 kg, SpO2 99 %.        Intake/Output Summary (Last 24 hours) at 07/21/2020 1326 Last data filed at 07/21/2020 1050 Gross per 24 hour  Intake 960 ml  Output 500 ml  Net 460 ml   Filed Weights   07/18/20 0738 07/19/20 1120  Weight: 54.9 kg 54 kg    Examination: General: Elderly female, no distress  HENT: Dry MM  Lungs: Exp Wheeze, no use of accessory muscles  Cardiovascular: RRR, no MRG Abdomen: Soft, non-tender, binder in place  Extremities: -edema Neuro: alert, follows commands, active  hallucinations  GU: deferred.   Resolved Hospital Problem list     Assessment & Plan:   Acute on Chronic Hypoxic Respiratory failure COPD Gold II -Followed By Dr. Chase Caller  -FEV 1 56%, FVC 87%, ratio 49, DLCO 43% (09/2015)  H/O Lung CA Stage 1 s/p RUL Lobectomy 2013 by Dr. Servando Snare followed by Dr. Julien Nordmann, no reoccurrence noted on follow ups  Plan -Continue Scheduled Breo/Incruse and Duoneb -Hold Steroids for now. Wheezing per documentation along with SOB has improved.  -Encourage good pulmonary hygiene, Encourage IS use and OOB -Maintain Oxygen Saturation >88 -Will need follow up appointment at discharge   Encephalopathy, Medication related vs Hospital Delirium vs Hypoxia -Have spoken to Dr. Sherral Hammers. He has plans to decrease Robaxin and Tramadol today -ABG 7.454/37.9 CO2   Labs   CBC: Recent Labs  Lab 07/16/20 1500 07/19/20 0542 07/21/20 0455  WBC 6.0 8.6 11.9*  NEUTROABS  --   --  10.9*  HGB 15.0 12.4 11.7*  HCT 43.8 37.1 34.3*  MCV 96.7 98.4 96.3  PLT 200 156 622    Basic Metabolic Panel: Recent Labs  Lab 07/16/20 1500 07/19/20 0542 07/20/20 0510 07/21/20 0455  NA 134* 133* 132* 133*  K 4.0 4.5 3.8 3.4*  CL 100 100 97* 95*  CO2 26 23 26 26   GLUCOSE 85 96 110* 129*  BUN 9 14 10 14   CREATININE 0.93 1.06* 0.81 0.78  CALCIUM 9.6 8.7* 8.7* 8.6*  MG  --  2.1  --  1.9  PHOS  --   --  2.5 2.7   GFR: Estimated Creatinine Clearance: 46.6 mL/min (by C-G formula based on SCr of 0.78 mg/dL). Recent Labs  Lab 07/16/20 1500 07/19/20 0542 07/21/20 0455  WBC 6.0 8.6 11.9*    Liver Function Tests: Recent Labs  Lab 07/20/20 0510 07/21/20 0455  AST  --  48*  ALT  --  31  ALKPHOS  --  39  BILITOT  --  1.7*  PROT  --  5.8*  ALBUMIN 3.8 3.6   No results for input(s): LIPASE, AMYLASE in the last 168 hours. No results for input(s): AMMONIA in the last 168 hours.  ABG    Component Value Date/Time   PHART 7.454 (H) 07/20/2020 0817   PCO2ART 37.9 07/20/2020  0817   PO2ART 57.4 (L) 07/20/2020 0817   HCO3 26.2 07/20/2020 0817   TCO2 26.3 10/21/2011 0340   O2SAT 91.2 07/20/2020 0817     Coagulation Profile: No results for input(s): INR, PROTIME in the last 168 hours.  Cardiac Enzymes: No results for input(s): CKTOTAL, CKMB, CKMBINDEX, TROPONINI in the last 168 hours.  HbA1C: Hgb A1c MFr Bld  Date/Time Value Ref Range Status  09/14/2016 10:35 AM 5.3 4.6 - 6.5 % Final    Comment:    Glycemic Control Guidelines for People with Diabetes:Non Diabetic:  <6%Goal of Therapy: <7%Additional Action Suggested:  >8%   09/22/2015 10:17 AM 5.4 4.6 - 6.5 % Final    Comment:    Glycemic Control Guidelines for People with Diabetes:Non Diabetic:  <6%Goal of Therapy: <7%Additional Action Suggested:  >8%     CBG: No results for input(s): GLUCAP in the last 168 hours.  Review of Systems:   As above in HPI.   Past Medical History  She,  has a past medical history of Allergic rhinitis, Anemia, Anxiety, Arthritis, Bilateral cataracts, Chicken pox, Cholelithiasis (05/21/2014), COPD (chronic obstructive pulmonary disease) ([place]), Depression, Gout, History of blood transfusion, Hot flashes, migraines (90's), Hyperlipidemia, Hypertension, IBS (irritable bowel syndrome), Liver cyst, Macrocytosis, Osteopenia, Pneumonia, PONV (postoperative nausea and vomiting), S/P right THA, AA - Dr. 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   Surgical History    Past Surgical History:  Procedure Laterality Date  . CARPAL TUNNEL RELEASE  80's   right   . COLONOSCOPY    . cyst removed  >13yrs ago   from left leg  . DILATION AND CURETTAGE OF UTERUS     at age 30  . EXCISIONAL TOTAL KNEE ARTHROPLASTY Right 06/03/2014   Procedure: RIGHT KNEE SCAR EXCISION;  Surgeon: [name], MD;  Location: [facility];  Service: Orthopedics;  Laterality: Right;  . INGUINAL HERNIA REPAIR  2000  . ROTATOR CUFF REPAIR  2001   right  .  TONSILLECTOMY  as a child   and adenoids  . TOTAL ABDOMINAL HYSTERECTOMY  2003   benign tumor-[place]  . TOTAL HIP ARTHROPLASTY  2011   left  . TOTAL HIP ARTHROPLASTY  03/07/2012   Procedure: TOTAL HIP ARTHROPLASTY ANTERIOR APPROACH;  Surgeon: [name], MD;  Location: [facility];  Service: Orthopedics;  Laterality: Right;  . TOTAL KNEE ARTHROPLASTY Right 01/02/2013   Procedure: RIGHT TOTAL KNEE ARTHROPLASTY;  Surgeon: [name], MD;  Location: [facility];  Service: Orthopedics;  Laterality: Right;  . TOTAL KNEE ARTHROPLASTY Left 04/03/2013   Procedure: LEFT TOTAL KNEE ARTHROPLASTY;  Surgeon: [name], MD;  Location: [facility];  Service: Orthopedics;  Laterality:  Left;  . VIDEO BRONCHOSCOPY  10/20/2011   Procedure: VIDEO BRONCHOSCOPY;  Surgeon: Grace Isaac, MD;  Location: [facility];  Service: Thoracic;  Laterality: N/A;  . WEDGE RESECTION  10-20-2011   rt lung  - for lung cancer     Social History   reports that she quit smoking about 8 years ago. Her smoking use included cigarettes. She has a 25.00 pack-year smoking history. She has never used smokeless tobacco. She reports current alcohol use. She reports that she does not use drugs.   Family History   Her family history includes Alcohol abuse in an other family member; Arthritis in her brother; Bowel Disease (age of onset: 78) in her father; Depression in an other family member; Hearing loss in an other family member; Heart attack in her father; Heart disease in an other family member; Hypertension in her brother and another family member; Other in her mother; Post-traumatic stress disorder in her brother. There is no history of Anesthesia problems, Hypotension, Malignant hyperthermia, Pseudochol deficiency, or Breast cancer.   Allergies Allergies  Allergen Reactions  . Clindamycin/Lincomycin Nausea And Vomiting  . Penicillins Rash       . Spiriva [Tiotropium Bromide Monohydrate]     General intolerance  . Sulfa Antibiotics  Hives and Rash    Severe Hives with skin reaction     Home Medications  Prior to Admission medications   Medication Sig Start Date End Date Taking? Authorizing Provider  acetaminophen (TYLENOL) 325 MG tablet Take 325 mg by mouth every 6 (six) hours as needed for moderate pain. Alternates with Ibuprofen   Yes [provider]  amLODipine (NORVASC) 5 MG tablet TAKE 1 TABLET BY MOUTH AT BEDTIME Patient taking differently: Take 5 mg by mouth daily. 08/31/19  Yes Koberlein, Steele Berg, MD  Budeson-Glycopyrrol-Formoterol (BREZTRI AEROSPHERE) 160-9-4.8 MCG/ACT AERO Inhale 2 puffs into the lungs 2 (two) times daily. 04/25/20  Yes Brand Males, MD  cholecalciferol (VITAMIN D3) 25 MCG (1000 UNIT) tablet Take 1,000 Units by mouth daily.   Yes [provider]  FLUoxetine (PROZAC) 20 MG capsule Take 1 capsule by mouth twice daily Patient taking differently: Take 20 mg by mouth 2 (two) times daily. 06/17/20  Yes Koberlein, Steele Berg, MD  PROAIR HFA 108 (90 Base) MCG/ACT inhaler INHALE 2 PUFFS BY MOUTH EVERY 6 HOURS AS NEEDED FOR WHEEZING OR SHORTNESS OF BREATH Patient taking differently: Inhale 2 puffs into the lungs every 6 (six) hours as needed for wheezing or shortness of breath. INHALE 2 PUFFS BY MOUTH EVERY 6 HOURS AS NEEDED FOR WHEEZING OR SHORTNESS OF BREATH 04/15/20  Yes Brand Males, MD  rosuvastatin (CRESTOR) 5 MG tablet Take 1 tablet by mouth once daily Patient taking differently: Take 5 mg by mouth daily. 06/17/20  Yes Koberlein, Steele Berg, MD  vitamin B-12 (CYANOCOBALAMIN) 1000 MCG tablet Take 1,000 mcg by mouth daily.    Yes [provider]  vitamin E 400 UNIT capsule Take 400 Units by mouth daily.   Yes [provider]

## 2020-07-21 NOTE — Progress Notes (Signed)
Chaplain engaged in initial visit with [name] and one of her best friends.  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  Her friend expressed concern about [name]'s husband being home by himself because he has been experiencing some memory loss issues.  [name] has served as his caregiver and is now experiencing her own health challenges.  [name]'s friend seemed to be communicating a need for some intervention so that they are not left just taking care of each other.  Friend did note that she and others have been checking on Mr. [name] and their home regularly.  Friend also stated that [name]'s step-daughter would be coming in on Friday when chaplain asked about family.  Friend understands that a lot can't be done on her part because of not being of blood relation.   Chaplain offered ministries of presence and listening.  [name] during this time discussed different, people, events, times, and experiences in her life.  She jumped from one event to the other.  The nurse noted that she was a bit confused.    Chaplain will follow-up as needed.     07/21/20 1100  Clinical Encounter Type  Visited With Patient and family together  Visit Type Initial;Social support

## 2020-07-22 ENCOUNTER — Encounter (HOSPITAL_COMMUNITY): Payer: Self-pay | Admitting: Surgery

## 2020-07-22 DIAGNOSIS — C3400 Malignant neoplasm of unspecified main bronchus: Secondary | ICD-10-CM

## 2020-07-22 DIAGNOSIS — N1832 Chronic kidney disease, stage 3b: Secondary | ICD-10-CM

## 2020-07-22 LAB — COMPREHENSIVE METABOLIC PANEL
ALT: 44 U/L (ref 0–44)
AST: 66 U/L — ABNORMAL HIGH (ref 15–41)
Albumin: 4.1 g/dL (ref 3.5–5.0)
Alkaline Phosphatase: 39 U/L (ref 38–126)
Anion gap: 10 (ref 5–15)
BUN: 16 mg/dL (ref 8–23)
CO2: 27 mmol/L (ref 22–32)
Calcium: 8.9 mg/dL (ref 8.9–10.3)
Chloride: 98 mmol/L (ref 98–111)
Creatinine, Ser: 0.67 mg/dL (ref 0.44–1.00)
GFR, Estimated: >60 mL/min (ref >60)
Glucose, Bld: 125 mg/dL — ABNORMAL HIGH (ref 70–99)
Potassium: 3.8 mmol/L (ref 3.5–5.1)
Sodium: 135 mmol/L (ref 135–145)
Total Bilirubin: 1.6 mg/dL — ABNORMAL HIGH (ref 0.3–1.2)
Total Protein: 6.3 g/dL — ABNORMAL LOW (ref 6.5–8.1)

## 2020-07-22 LAB — CBC WITH DIFFERENTIAL/PLATELET
Abs Immature Granulocytes: 0.04 10*3/uL (ref 0.00–0.07)
Basophils Absolute: 0 10*3/uL (ref 0.0–0.1)
Basophils Relative: 0 %
Eosinophils Absolute: 0 10*3/uL (ref 0.0–0.5)
Eosinophils Relative: 0 %
HCT: 31.4 % — ABNORMAL LOW (ref 36.0–46.0)
Hemoglobin: 10.8 g/dL — ABNORMAL LOW (ref 12.0–15.0)
Immature Granulocytes: 0 %
Lymphocytes Relative: 6 %
Lymphs Abs: 0.6 10*3/uL — ABNORMAL LOW (ref 0.7–4.0)
MCH: 33.2 pg (ref 26.0–34.0)
MCHC: 34.4 g/dL (ref 30.0–36.0)
MCV: 96.6 fL (ref 80.0–100.0)
Monocytes Absolute: 0.7 10*3/uL (ref 0.1–1.0)
Monocytes Relative: 7 %
Neutro Abs: 8.7 10*3/uL — ABNORMAL HIGH (ref 1.7–7.7)
Neutrophils Relative %: 87 %
Platelets: 170 10*3/uL (ref 150–400)
RBC: 3.25 MIL/uL — ABNORMAL LOW (ref 3.87–5.11)
RDW: 12.1 % (ref 11.5–15.5)
WBC: 10.1 10*3/uL (ref 4.0–10.5)
nRBC: 0 % (ref 0.0–0.2)

## 2020-07-22 LAB — PHOSPHORUS: Phosphorus: 2.1 mg/dL — ABNORMAL LOW (ref 2.5–4.6)

## 2020-07-22 LAB — MAGNESIUM: Magnesium: 2 mg/dL (ref 1.7–2.4)

## 2020-07-22 MED ORDER — ALBUTEROL SULFATE (2.5 MG/3ML) 0.083% IN NEBU
2.5000 mg | INHALATION_SOLUTION | Freq: Two times a day (BID) | RESPIRATORY_TRACT | Status: DC
  Administered 2020-07-23 – 2020-07-24 (×3): 2.5 mg via RESPIRATORY_TRACT
  Filled 2020-07-22 (×3): qty 3

## 2020-07-22 MED ORDER — FUROSEMIDE 20 MG PO TABS: 20.0000 mg | ORAL_TABLET | Freq: Once | ORAL | Status: DC

## 2020-07-22 MED ORDER — HALOPERIDOL 1 MG PO TABS
1.0000 mg | ORAL_TABLET | Freq: Two times a day (BID) | ORAL | Status: DC
  Administered 2020-07-22 – 2020-07-24 (×4): 1 mg via ORAL
  Filled 2020-07-22 (×4): qty 1

## 2020-07-22 MED ORDER — K PHOS MONO-SOD PHOS DI & MONO 155-852-130 MG PO TABS
500.0000 mg | ORAL_TABLET | Freq: Two times a day (BID) | ORAL | Status: DC
  Administered 2020-07-22 – 2020-07-24 (×5): 500 mg via ORAL
  Filled 2020-07-22 (×5): qty 2

## 2020-07-22 NOTE — Progress Notes (Signed)
Pt with audible wheezing upper on expiration. Notified RT, requested albuterol neb. Awaiting their arrival. Will continue to monitor pt. Closely. Neomia Dear RN

## 2020-07-22 NOTE — Evaluation (Signed)
Occupational Therapy Evaluation Patient Details Name: [name] MRN: [id number] DOB: August 09, 1941 Today's Date: 07/22/2020    History of Present Illness 79 yo female s/p hernia repair with mesh 07/18/20. Pt also sustained a fall 12/12 in hospital. Hx of COPD, lung ca s/p lobectomy, osteopenia, depression, R TKA 2014   Clinical Impression   Patient is currently requiring assistance with ADLs including maximum with toileting and LE bathing, Moderate assist with LE dressing, and Minimal assist to full setup with UE dressing, grooming and eating, all of which is below patient's typical baseline of being Independent.  During this evaluation, patient was limited by lethargy, generalized weakness, impaired cognition, and poor activity tolerance, which has the potential to impact patient's safety and independence during functional mobility, as well as performance for ADLs. Rafael Gonzalez "6-clicks" Daily Activity Inpatient Short Form score of 16/24 indicates 53.32% ADL impairment this session. Patient lives with her husband who has dementia and pt is typically in the role of her husband's caregiver.  Pt has some outside help from friends and neighbors, but no 24/7 supervision and assistance is available.  Patient demonstrates fair rehab potential, and should benefit from continued skilled occupational therapy services while in acute care to maximize safety, independence and quality of life at home.  Continued occupational therapy services in a SNF setting prior to return home is recommended.  ?   Follow Up Recommendations  SNF    Equipment Recommendations       Recommendations for Other Services       Precautions / Restrictions Precautions Precautions: Fall Precaution Comments: abd sg Restrictions Weight Bearing Restrictions: No      Mobility Bed Mobility Overal bed mobility: Needs Assistance Bed Mobility: Supine to Sit     Supine to sit: Max assist;HOB elevated      General bed mobility comments: Multimodal and repeated cueing required. Assist for LEs and trunk. Increased time. Pt not maintain grasp on bed rail despite hand over hand cues. Flopping hands back to bed.    Transfers Overall transfer level: Needs assistance Equipment used: Rolling walker (2 wheeled) Transfers: Sit to/from Stand Sit to Stand: Min assist         General transfer comment: Assist to rise, stabilize, control descent. Multimodal cueing required.  Pt descended back to EOB without warning, requiring Min As for safety.    Balance Overall balance assessment: Needs assistance;History of Falls Sitting-balance support: Bilateral upper extremity supported Sitting balance-Leahy Scale: Poor Sitting balance - Comments: Pt crossing ankles and lossing balance laterally and posteriorly. Needs frequent cues to uncorss legs to provide foot support. Very lethargic.   Standing balance support: Bilateral upper extremity supported Standing balance-Leahy Scale: Poor                             ADL either performed or assessed with clinical judgement   ADL Overall ADL's : Needs assistance/impaired Eating/Feeding: Set up;Supervision/ safety;Bed level   Grooming: Wash/dry hands;Maximal assistance;Bed level Grooming Details (indicate cue type and reason): Minimal participation/lethargy Upper Body Bathing: Moderate assistance;Bed level   Lower Body Bathing: Maximal assistance;Sitting/lateral leans;Bed level;Sit to/from stand   Upper Body Dressing : Minimal assistance;Bed level   Lower Body Dressing: Maximal assistance;Moderate assistance Lower Body Dressing Details (indicate cue type and reason): Mod As to don socks sitting EOB with assist for both balance and task. Max As for standing LE dressing.   Toilet Transfer Details (indicate cue type and reason):  Pt declined. Please see Mobility section. Toileting- Clothing Manipulation and Hygiene: Maximal assistance;Sitting/lateral  lean;Bed level       Functional mobility during ADLs: Maximal assistance;Minimal assistance General ADL Comments: Max As bed (lethargic), Min As Sit<>Stand     Vision   Additional Comments: Unable to relaibly test due to lethargy. Pt often closing eyes, gaze unfocused. Absent pursuits as pt closes eyes.     Perception     Praxis      Pertinent Vitals/Pain Pain Assessment: No/denies pain     Hand Dominance Right   Extremity/Trunk Assessment Upper Extremity Assessment Upper Extremity Assessment: Generalized weakness   Lower Extremity Assessment Lower Extremity Assessment: Generalized weakness   Cervical / Trunk Assessment Cervical / Trunk Assessment: Normal   Communication Communication Communication: No difficulties;Other (comment) (Slurred at times poss due to lethargy.)   Cognition Arousal/Alertness: Lethargic Behavior During Therapy: Flat affect;Impulsive Overall Cognitive Status: Impaired/Different from baseline Area of Impairment: Orientation;Attention;Memory;Following commands;Safety/judgement;Problem solving                 Orientation Level: Disoriented to;Place;Situation Current Attention Level: Focused Memory: Decreased short-term memory;Decreased recall of precautions Following Commands: Follows one step commands inconsistently Safety/Judgement: Decreased awareness of safety;Decreased awareness of deficits   Problem Solving: Requires tactile cues;Requires verbal cues;Slow processing     General Comments  Pt refused to take steps    Exercises     Shoulder Instructions      Home Living Family/patient expects to be discharged to:: Skilled nursing facility Living Arrangements: Spouse/significant other Available Help at Discharge: Friend(s);Available PRN/intermittently                         Home Equipment: Cane - quad   Additional Comments: Pt's friend stated pt uses a quad vs hurry-cane (friend unsure which) PRN      Prior  Functioning/Environment Level of Independence: Independent        Comments: per friend, pt was taking care of husband at home who has dementia, but still physically able to mobilize himself.        OT Problem List: Decreased strength;Decreased cognition;Decreased activity tolerance;Decreased safety awareness;Impaired balance (sitting and/or standing);Decreased knowledge of use of DME or AE;Decreased knowledge of precautions      OT Treatment/Interventions: Self-care/ADL training;Therapeutic exercise;Therapeutic activities;Energy conservation;Patient/family education;DME and/or AE instruction;Balance training;Cognitive remediation/compensation    OT Goals(Current goals can be found in the care plan section) Acute Rehab OT Goals Patient Stated Goal: pt unable to state.  Friend reports goal for pt to go to rehab. OT Goal Formulation: Patient unable to participate in goal setting ADL Goals Pt Will Perform Grooming: with supervision;standing Pt Will Perform Upper Body Bathing: with supervision;sitting Pt Will Perform Lower Body Bathing: with supervision;sit to/from stand Pt Will Perform Lower Body Dressing: with adaptive equipment;with supervision;sit to/from stand Pt Will Transfer to Toilet: with supervision Pt Will Perform Toileting - Clothing Manipulation and hygiene: with supervision;with adaptive equipment;sitting/lateral leans;sit to/from stand Pt/caregiver will Perform Home Exercise Program: Increased strength;Both right and left upper extremity;With theraband;With Supervision  OT Frequency: Min 2X/week   Barriers to D/C: Decreased caregiver support  Lives with husband who has dementia.  PRN assist from friends only.       Co-evaluation              AM-PAC OT "6 Clicks" Daily Activity     Outcome Measure Help from another person eating meals?: A Little Help from another person taking care of personal grooming?:  A Little Help from another person toileting, which includes  using toliet, bedpan, or urinal?: A Lot Help from another person bathing (including washing, rinsing, drying)?: A Lot Help from another person to put on and taking off regular upper body clothing?: A Little Help from another person to put on and taking off regular lower body clothing?: A Little 6 Click Score: 16   End of Session Equipment Utilized During Treatment: Gait belt;Rolling walker Nurse Communication: Mobility status  Activity Tolerance: Patient limited by lethargy Patient left: in bed;with family/visitor present;with bed alarm set;with call bell/phone within reach  OT Visit Diagnosis: Unsteadiness on feet (R26.81);Other symptoms and signs involving cognitive function;History of falling (Z91.81);Repeated falls (R29.6);Muscle weakness (generalized) (M62.81)                Time: 6599-3570 OT Time Calculation (min): 21 min Charges:  OT General Charges $OT Visit: 1 Visit OT Evaluation $OT Eval Low Complexity: 1 Low  Polly Barner, OT Acute Rehab Services Office: [phone] 07/22/2020  Julien Girt 07/22/2020, 12:36 PM

## 2020-07-22 NOTE — Progress Notes (Signed)
Followed up with RT r/g scheduled neb treatments. Awaiting a response. Neomia Dear RN

## 2020-07-22 NOTE — TOC Progression Note (Addendum)
Transition of Care [facility]) - Progression Note    Patient Details  Name: [name] MRN: [id number] Date of Birth: [birth year]  Transition of Care [facility]) CM/SW [place], LCSW Phone Number: 07/22/2020, 1:54 PM  Clinical Narrative:    CSW met with patient friend [name] at bedside and provided Medicare list of SNF options. [name] has requested to discuss the SNF options with the patient brother in law and daughter in law.   Expected Discharge Plan: [place] Barriers to Discharge: Continued Medical Work up,Insurance Authorization  Expected Discharge Plan and Services Expected Discharge Plan: [place] In-house Referral: Clinical Social Work   Post Acute Care Choice: [place] Living arrangements for the past 2 months: Single Family Home Expected Discharge Date: 07/19/20                                     Social Determinants of Health (SDOH) Interventions    Readmission Risk Interventions No flowsheet data found.

## 2020-07-22 NOTE — Progress Notes (Signed)
RN note: Report rec'ed from RN, restraint order expiring. MD on call notified due to pt very Combative and confused. Attempting to get out of bed. Non-restrictive measures not effective. To prevent injury and keep pt.safe requested order. Awaiting response from on call MD. Neomia Dear, RN

## 2020-07-22 NOTE — Progress Notes (Addendum)
[name] [id number] Dec 25, 1940  CARE TEAM:  PCP: Caren Macadam, MD  Outpatient Care Team: Patient Care Team: Caren Macadam, MD as PCP - General (Family Medicine) Brand Males, MD (Pulmonary Disease) Grace Isaac, MD (Cardiothoracic Surgery) Lorretta Harp, MD as Consulting Physician (Cardiology) Curt Bears, MD as Consulting Physician (Hematology and Oncology) Michael Boston, MD as Consulting Physician (General Surgery) Michael [place], MD as Consulting Physician (General Surgery)  Inpatient Treatment Team: Treatment Team: Attending Provider: Allie Bossier, MD; Consulting Physician: Patrecia Pour, MD; Rounding Team: Fatima Blank, MD; Technician: Ernest Mallick, NT; Rounding Team: Pccm, Md, MD; Consulting Physician: Juanito Doom, MD; Registered Nurse: Willene Hatchet, RN; Occupational Therapist: Julien Girt, OT; Registered Nurse: Aura Dials, RN; Consulting Physician: Michael Boston, MD   Problem List:   Principal Problem:   Acute delirium Active Problems:   Hypertension   Moderate COPD (chronic obstructive pulmonary disease), Sees Dr. 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  Left side incarcerated with preperitoneal fat and some omentum.  Obvious indirect left inguinal hernia of moderate size.  No definite recurrent right inguinal hernia.  Umbilical hernia   Assessment  Hypoxia on oxygen - stabilizing  Mild/moderate delirium  [facility] Stay  = 2 days)  Plan:  Medicine help appreciated  Pulmonary following.  Some hypoxia seems to be stabilized on a few liters of oxygen.  Lasix diuresis done yesterday.  Using inhalers.  I am tempted to give another dose of Lasix but will defer to pulmonary medicine.  I am not against steroid use if that is ultimately needed.    Increased confusion suspicious for delirium, especially sundowning..  No major metabolic or anemic etiology.  CT of head ordered by medicine shows no major abnormality.  Defer need for neurology evaluation to medicine service   Mild hypokalemia - correcting.  Try K-Phos and she has some hypophosphatemia as well  Sitter and restraints for now.  Trying to remobilize.  Bowel regimen.  Get on MiraLAX to make sure she is not too backed up/constipated.  Moderate ecchymosis in the flank and groins not surprising with the repairs intact.  Stable to resolving.  VTE prophylaxis- SCDs, etc  She is stable from a surgery standpoint.  Awaiting medicine and pulmonary help on the patient to prove any improvement/stability on her sundowning/delirium and hypoxia/COPD.  I am skeptical patient can safely go home.  Hopefully with careful support and monitoring, can mobilize and get her into a more active daytime routine and break the cycle of sundowning and confusion.  Make sure were not missing any organic or hypoxic etiology for her delirium.  Disposition:  Disposition:  The patient is from: Home  Anticipate discharge to:  [facility] signed  Anticipated Date of Discharge is:  July 23, 2020  Barriers to discharge:  Pending Clinical improvement (more likely than not - stable from surgery standpoint for d/c - awaiting pulmonary & MS improvements  Patient currently is NOT MEDICALLY STABLE for discharge from the hospital  from a surgery standpoint.   30 minutes spent in review, evaluation, examination, counseling, and coordination of care.  More than 50% of that  time was spent in counseling.  07/22/2020    Subjective: (Chief complaint)  Patient not sleeping well at night.  More confused and agitated at night.  Sitting in bed crosslegged.  Somewhat rambling but then will focus on smile and talk.  She knows she is in the hospital.  She thinks he is here for medicine adjustments.  Vaguely recalls having hernia surgery and findings.  Nursing in room.  Objective:  Vital signs:  Vitals:   07/21/20 2033 07/22/20 0352 07/22/20 0529 07/22/20 0642  BP: 135/89  109/76   Pulse: 65  97   Resp: 18  18   Temp: 99.4 F (37.4 C)  97.9 F (36.6 C)   TempSrc: Oral  Oral   SpO2: 95% 95% 95% 96%  Weight:      Height:        Last BM Date: 07/19/20  Intake/Output   Yesterday:  12/13 0701 - 12/14 0700 In: 1060 [P.O.:1060] Out: -  This shift:  No intake/output data recorded.  Bowel function:  Flatus: YES  BM:  No  Drain: (No drain)   Physical Exam:  General: Pt awake/alert in no acute distress.  Mildly fidgety but not agitated and active.  Smiling and joking.  Sitting comfortably with legs crossed.  Moving without any splinting or hesitancy  eyes: PERRL, normal EOM.  Sclera clear.  No icterus.  Atraumatic abrasions or hematoma Neuro: CN II-XII intact w/o focal sensory/motor deficits. Lymph: No head/neck/groin lymphadenopathy Psych: Somewhat wrangling and talkative.  Answer some questions.  Friend at bedside concerned that this is not her baseline mental status.  Oriented x 3.  Will answer questions and follow commands. HENT: Normocephalic, Mucus membranes moist.  No thrush Neck: Supple, No tracheal deviation.  No obvious thyromegaly Chest: No pain to chest wall compression.  Good respiratory excursion.  No audible wheezing -mild wheezing to auscultation. CV:  Pulses intact.  Regular rhythm.  No major extremity edema MS: Normal AROM mjr joints.  No obvious deformity  Abdomen: Soft.  Nondistended.  Nontender.  I removed laparoscopic  dressings.  Moderate ecchymosis on flank and groins.  No evidence of peritonitis.  No incarcerated hernias.  Abdominal binder loosely on and crawling up chest.  I removed.  Posey in place  Ext:  No deformity.  No mjr edema.  No cyanosis Skin: No petechiae / purpurea.  No major sores.  Warm and dry    Results:   Cultures: Recent Results (from the past 720 hour(s))  SARS CORONAVIRUS 2 (TAT 6-24 HRS) Nasopharyngeal Nasopharyngeal Swab     Status: None   Collection Time: 07/15/20 10:43 AM   Specimen: Nasopharyngeal Swab  Result Value Ref Range Status   SARS Coronavirus 2 NEGATIVE NEGATIVE Final    Comment: (NOTE) SARS-CoV-2 target nucleic acids are NOT DETECTED.  The SARS-CoV-2 RNA is generally detectable in upper and lower respiratory specimens during the acute phase of infection. Negative results do not preclude SARS-CoV-2 infection, do not rule out co-infections with other pathogens, and should not be used as the sole basis for treatment or other patient management decisions. Negative results must be combined with clinical observations, patient history, and epidemiological information. The expected result is Negative.  Fact Sheet for Patients: SugarRoll.be  Fact Sheet for Healthcare Providers: https://www.woods-mathews.com/  This test is not yet approved or cleared by the [place]  FDA and  has been authorized for detection and/or diagnosis of SARS-CoV-2 by FDA under an Emergency Use Authorization (EUA). This EUA will remain  in effect (meaning this test can be used) for the duration of the COVID-19 declaration under Se ction 564(b)(1) of the Act, 21 U.S.C. section 360bbb-3(b)(1), unless the authorization is terminated or revoked sooner.  Performed at [facility], [place] [address], [place], [place] [address]     Labs: Results for orders placed or performed during the hospital encounter of 07/18/20 (from the past 48  hour(s))  Blood gas, arterial     Status: Abnormal   Collection Time: 07/20/20  8:17 AM  Result Value Ref Range   pH, Arterial 7.454 (H) 7.350 - 7.450   pCO2 arterial 37.9 32.0 - 48.0 mmHg   pO2, Arterial 57.4 (L) 83.0 - 108.0 mmHg   Bicarbonate 26.2 20.0 - 28.0 mmol/L   Acid-Base Excess 2.8 (H) 0.0 - 2.0 mmol/L   O2 Saturation 91.2 %   Patient temperature 98.6     Comment: Performed at [facility], [place] [address], [place], [place] [address]  Comprehensive metabolic panel     Status: Abnormal   Collection Time: 07/21/20  4:55 AM  Result Value Ref Range   Sodium 133 (L) 135 - 145 mmol/L   Potassium 3.4 (L) 3.5 - 5.1 mmol/L   Chloride 95 (L) 98 - 111 mmol/L   CO2 26 22 - 32 mmol/L   Glucose, Bld 129 (H) 70 - 99 mg/dL    Comment: Glucose reference range applies only to samples taken after fasting for at least 8 hours.   BUN 14 8 - 23 mg/dL   Creatinine, Ser 0.78 0.44 - 1.00 mg/dL   Calcium 8.6 (L) 8.9 - 10.3 mg/dL   Total Protein 5.8 (L) 6.5 - 8.1 g/dL   Albumin 3.6 3.5 - 5.0 g/dL   AST 48 (H) 15 - 41 U/L   ALT 31 0 - 44 U/L   Alkaline Phosphatase 39 38 - 126 U/L   Total Bilirubin 1.7 (H) 0.3 - 1.2 mg/dL   GFR, Estimated >60 >60 mL/min    Comment: (NOTE) Calculated using the CKD-EPI Creatinine Equation (2021)    Anion gap 12 5 - 15    Comment: Performed at [facility], [place] [address] [place], [place] [address]  Magnesium     Status: None   Collection Time: 07/21/20  4:55 AM  Result Value Ref Range   Magnesium 1.9 1.7 - 2.4 mg/dL    Comment: Performed at [facility], [place] [address], [place], [place] [address]  Phosphorus     Status: None   Collection Time: 07/21/20  4:55 AM  Result Value Ref Range   Phosphorus 2.7 2.5 - 4.6 mg/dL    Comment: Performed at [facility], [place] [address], [place], [place] [address]  CBC with Differential/Platelet     Status: Abnormal   Collection Time: 07/21/20  4:55  AM  Result Value Ref Range   WBC 11.9 (H) 4.0 - 10.5 K/uL   RBC 3.56 (L) 3.87 - 5.11 MIL/uL   Hemoglobin 11.7 (L) 12.0 - 15.0 g/dL   HCT 34.3 (L) 36.0 - 46.0 %   MCV 96.3 80.0 - 100.0 fL   MCH 32.9 26.0 - 34.0 pg   MCHC 34.1 30.0 - 36.0 g/dL   RDW 11.9 11.5 - 15.5 %   Platelets 173 150 - 400 K/uL  nRBC 0.0 0.0 - 0.2 %   Neutrophils Relative % 92 %   Neutro Abs 10.9 (H) 1.7 - 7.7 K/uL   Lymphocytes Relative 4 %   Lymphs Abs 0.5 (L) 0.7 - 4.0 K/uL   Monocytes Relative 4 %   Monocytes Absolute 0.4 0.1 - 1.0 K/uL   Eosinophils Relative 0 %   Eosinophils Absolute 0.0 0.0 - 0.5 K/uL   Basophils Relative 0 %   Basophils Absolute 0.0 0.0 - 0.1 K/uL   Immature Granulocytes 0 %   Abs Immature Granulocytes 0.05 0.00 - 0.07 K/uL    Comment: Performed at [facility], [place] [address] [place], [place] [address]  Comprehensive metabolic panel     Status: Abnormal   Collection Time: 07/22/20  4:58 AM  Result Value Ref Range   Sodium 135 135 - 145 mmol/L   Potassium 3.8 3.5 - 5.1 mmol/L   Chloride 98 98 - 111 mmol/L   CO2 27 22 - 32 mmol/L   Glucose, Bld 125 (H) 70 - 99 mg/dL    Comment: Glucose reference range applies only to samples taken after fasting for at least 8 hours.   BUN 16 8 - 23 mg/dL   Creatinine, Ser 0.67 0.44 - 1.00 mg/dL   Calcium 8.9 8.9 - 10.3 mg/dL   Total Protein 6.3 (L) 6.5 - 8.1 g/dL   Albumin 4.1 3.5 - 5.0 g/dL   AST 66 (H) 15 - 41 U/L   ALT 44 0 - 44 U/L   Alkaline Phosphatase 39 38 - 126 U/L   Total Bilirubin 1.6 (H) 0.3 - 1.2 mg/dL   GFR, Estimated >60 >60 mL/min    Comment: (NOTE) Calculated using the CKD-EPI Creatinine Equation (2021)    Anion gap 10 5 - 15    Comment: Performed at [facility], [place] [address] [place], [place] [address]  Magnesium     Status: None   Collection Time: 07/22/20  4:58 AM  Result Value Ref Range   Magnesium 2.0 1.7 - 2.4 mg/dL    Comment: Performed at [facility],  [place] [address], [place], [place] [address]  Phosphorus     Status: Abnormal   Collection Time: 07/22/20  4:58 AM  Result Value Ref Range   Phosphorus 2.1 (L) 2.5 - 4.6 mg/dL    Comment: Performed at [facility], [place] [address] [place], [place] [address]  CBC with Differential/Platelet     Status: Abnormal   Collection Time: 07/22/20  4:58 AM  Result Value Ref Range   WBC 10.1 4.0 - 10.5 K/uL   RBC 3.25 (L) 3.87 - 5.11 MIL/uL   Hemoglobin 10.8 (L) 12.0 - 15.0 g/dL   HCT 31.4 (L) 36.0 - 46.0 %   MCV 96.6 80.0 - 100.0 fL   MCH 33.2 26.0 - 34.0 pg   MCHC 34.4 30.0 - 36.0 g/dL   RDW 12.1 11.5 - 15.5 %   Platelets 170 150 - 400 K/uL   nRBC 0.0 0.0 - 0.2 %   Neutrophils Relative % 87 %   Neutro Abs 8.7 (H) 1.7 - 7.7 K/uL   Lymphocytes Relative 6 %   Lymphs Abs 0.6 (L) 0.7 - 4.0 K/uL   Monocytes Relative 7 %   Monocytes Absolute 0.7 0.1 - 1.0 K/uL   Eosinophils Relative 0 %   Eosinophils Absolute 0.0 0.0 - 0.5 K/uL   Basophils Relative 0 %   Basophils Absolute 0.0 0.0 -  0.1 K/uL   Immature Granulocytes 0 %   Abs Immature Granulocytes 0.04 0.00 - 0.07 K/uL    Comment: Performed at [facility], [place] [address], [place], [place] [address]    Imaging / Studies: CT HEAD WO CONTRAST  Result Date: 07/21/2020 CLINICAL DATA:  Stroke suspected. Patient with acute onset of cognitive change evaluate for stroke. Fall last night, headache. EXAM: CT HEAD WITHOUT CONTRAST TECHNIQUE: Contiguous axial images were obtained from the base of the skull through the vertex without intravenous contrast. COMPARISON:  Prior head CT examinations 07/08/2020 and earlier. Brain MRI 10/06/2011. FINDINGS: Brain: Moderate generalized cerebral atrophy. Comparatively mild cerebellar atrophy. Mild ill-defined hypoattenuation within the cerebral white matter is nonspecific, but compatible with chronic small vessel ischemic disease. There is no acute intracranial hemorrhage. No  demarcated cortical infarct. No extra-axial fluid collection. No evidence of intracranial mass. No midline shift. Vascular: No hyperdense vessel.  Atherosclerotic calcifications Skull: Normal. Negative for fracture or focal lesion. Sinuses/Orbits: Visualized orbits show no acute finding. No significant paranasal sinus disease at the imaged levels. IMPRESSION: No evidence of acute intracranial abnormality. Moderate cerebral atrophy and mild chronic small vessel ischemic disease, stable as compared to the head CT of 07/08/2020. Electronically Signed   By: Kellie Simmering DO   On: 07/21/2020 09:23   DG CHEST PORT 1 VIEW  Result Date: 07/20/2020 CLINICAL DATA:  Post hernia repair.  Decreased oxygen saturations EXAM: PORTABLE CHEST 1 VIEW COMPARISON:  07/19/2020 FINDINGS: Normal cardiac silhouette. Emphysema the upper lobes. Chronic calcified nodule in the RIGHT upper lobe. No pneumothorax. No infiltrate. No pulmonary edema IMPRESSION: No acute cardiopulmonary process.  Emphysema Electronically Signed   By: Suzy Bouchard M.D.   On: 07/20/2020 09:23    Medications / Allergies: per chart  Antibiotics: Anti-infectives (From admission, onward)   Start     Dose/Rate Route Frequency Ordered Stop   07/18/20 0600  clindamycin (CLEOCIN) IVPB 900 mg       "And" Linked Group Details   900 mg 100 mL/hr over 30 Minutes Intravenous On call to O.R. 07/17/20 0945 07/18/20 0918   07/18/20 0600  gentamicin (GARAMYCIN) 270 mg in dextrose 5 % 100 mL IVPB       "And" Linked Group Details   5 mg/kg  54.9 kg 213.5 mL/hr over 30 Minutes Intravenous On call to O.R. 07/17/20 0945 07/18/20 0935        Note: Portions of this report may have been transcribed using voice recognition software. Every effort was made to ensure accuracy; however, inadvertent computerized transcription errors may be present.   Any transcriptional errors that result from this process are unintentional.    Adin Hector, MD, FACS,  [facility] Gastrointestinal and Minimally Invasive Surgery  [facility] [address], [place], [place] [address] [phone] Fax [phone] Main/Paging  CONTACT INFORMATION: Weekday (9AM-5PM) concerns: Call [facility] main office at [phone] Weeknight (5PM-9AM) or Weekend/Holiday concerns: Check [email or web address] for General Surgery [facility] coverage (Please, do not use SecureChat as it is not reliable communication to operating surgeons for immediate patient care)      07/22/2020  7:31 AM

## 2020-07-22 NOTE — Progress Notes (Addendum)
NAME:  [name], MRN:  [id number], DOB:  [birth year], LOS: 2 ADMISSION DATE:  07/18/2020, CONSULTATION DATE:  07/21/2020 REFERRING MD:  Dr. Johney Maine , CHIEF COMPLAINT:  COPD   History of present illness   79 year old underwent hernia repair 12/10. POD 1 with Hypoxia, Wheezing, and confusion. Non-Compliant with home oxygen. 12/13 Pulmonary Consulted.   Patient follows Dr. Chase Caller. Last appointment 04/15/2020. Prescribed Triple Combination Inhaler and as needed Proair. Patient reports wearing oxygen as needed. states that when she feels short of breath she will use inhaler or put her oxygen on. Reports Tobacco use, quiet "years" ago. Has non-productive cough. Feels breathing has improved however still wheezy. Was able to ambulate today. Able to speak multiple sentences without pause.   Quit smoking 2013 with a 37.5 pack year smoking history pack   Past Medical History  COPD, Stage 1 Lung CA s/p RUL Lobectomy, Chronic Hypoxia on home 02 (noncompliant), HTN, HLD, Depression   Significant Hospital Events   12/10 > Hernia Repair  12/12 > Pulmonary Consulted   Consults:  Pulmonary  [facility]   Procedures:    Significant Diagnostic Tests:  CXR 12/12 > Chronic Calcified Nodule in RUL. No acute process  Micro Data:  Blood : 07/21/2020>>   Antimicrobials:     Interim history/subjective:  Remains on 3 L [place] with sat of 97%, RR 16 K of 3.8, phos of 2.1 >>  repleted Na 135 AST of 66 ( Increased from 48 12/13) Scheduled to see Ramaswamy 08/26/2020 She will age out of Lung Cancer Screening after this year due to her age.  Less confused today, but not at baseline.  Objective   Blood pressure 104/87, pulse 93, temperature 98.4 F (36.9 C), temperature source Oral, resp. rate 16, height 5' 2.01" (1.575 m), weight 54 kg, SpO2 97 %.        Intake/Output Summary (Last 24 hours) at 07/22/2020 1418 Last data filed at 07/22/2020 1000 Gross per 24 hour  Intake 900 ml  Output 0 ml   Net 900 ml   Filed Weights   07/18/20 0738 07/19/20 1120  Weight: 54.9 kg 54 kg    Examination: General: Elderly female, no distress, remains pleasantly confused, but this is improving per family and staff  HENT: NCAT, Dry MM , No LAD, No JVD Lungs: Bilateral chest excursion, Clear throughout with few crackles per bases.   Cardiovascular: S1, S2, RRR, no MRG, brisk capillary refill Abdomen: Soft, non-tender, binder in place  Extremities: -edema, warm and dry Neuro: alert,oriented to self and friends, but neither place or time follows commands, + active hallucinations  GU: deferred.   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  -Followed By Dr. Chase Caller  -FEV 1 56%, FVC 87%, ratio 49, DLCO 43% (09/2015)  H/O Lung CA Stage 1 s/p RUL Lobectomy 2013 by Dr. Servando Snare followed by Dr. Julien Nordmann, no reoccurrence noted on follow ups  Plan -Continue Scheduled Breo/Incruse and Duoneb -Hold Steroids for now. Wheezing per documentation along with SOB has improved.  -Encourage good pulmonary hygiene, Encourage IS use and OOB -Titrate oxygen to Maintain Oxygen Saturation >88 -Will need follow up appointment at discharge   Encephalopathy, Medication related vs Hospital Delirium vs Hypoxia -Have spoken to Dr. Sherral Hammers. He has plans to decrease Robaxin and  Tramadol today -ABG 7.454/37.9 CO2>> Not hypercarbic etiology  Pt. States she does not wear her oxygen at home > She has had several falls lately per friends, and I wonder if this could be related to hypoxia.  She does need a portable oxygen device, which may lead to better compliance of her nocturnal oxygen. She  needs to be walked before discharge as she may need exertional oxygen.  Will need follow up of  this at the office 08/26/2020 which is already scheduled to get  this ordered.    Labs   CBC: Recent Labs  Lab 07/16/20 1500 07/19/20 0542 07/21/20 0455 07/22/20 0458  WBC 6.0 8.6 11.9* 10.1  NEUTROABS  --   --  10.9* 8.7*  HGB 15.0 12.4 11.7* 10.8*  HCT 43.8 37.1 34.3* 31.4*  MCV 96.7 98.4 96.3 96.6  PLT 200 156 173 161    Basic Metabolic Panel: Recent Labs  Lab 07/16/20 1500 07/19/20 0542 07/20/20 0510 07/21/20 0455 07/22/20 0458  NA 134* 133* 132* 133* 135  K 4.0 4.5 3.8 3.4* 3.8  CL 100 100 97* 95* 98  CO2 26 23 26 26 27   GLUCOSE 85 96 110* 129* 125*  BUN 9 14 10 14 16   CREATININE 0.93 1.06* 0.81 0.78 0.67  CALCIUM 9.6 8.7* 8.7* 8.6* 8.9  MG  --  2.1  --  1.9 2.0  PHOS  --   --  2.5 2.7 2.1*   GFR: Estimated Creatinine Clearance: 46.6 mL/min (by C-G formula based on SCr of 0.67 mg/dL). Recent Labs  Lab 07/16/20 1500 07/19/20 0542 07/21/20 0455 07/22/20 0458  WBC 6.0 8.6 11.9* 10.1    Liver Function Tests: Recent Labs  Lab 07/20/20 0510 07/21/20 0455 07/22/20 0458  AST  --  48* 66*  ALT  --  31 44  ALKPHOS  --  39 39  BILITOT  --  1.7* 1.6*  PROT  --  5.8* 6.3*  ALBUMIN 3.8 3.6 4.1   No results for input(s): LIPASE, AMYLASE in the last 168 hours. No results for input(s): AMMONIA in the last 168 hours.  ABG    Component Value Date/Time   PHART 7.454 (H) 07/20/2020 0817   PCO2ART 37.9 07/20/2020 0817   PO2ART 57.4 (L) 07/20/2020 0817   HCO3 26.2 07/20/2020 0817   TCO2 26.3 10/21/2011 0340   O2SAT 91.2 07/20/2020 0817     Coagulation Profile: No results for input(s): INR, PROTIME in the last 168 hours.  Cardiac Enzymes: No results for input(s): CKTOTAL, CKMB, CKMBINDEX, TROPONINI in the last 168 hours.  HbA1C: Hgb A1c MFr Bld  Date/Time Value Ref Range Status  09/14/2016 10:35 AM 5.3 4.6 - 6.5 % Final    Comment:    Glycemic Control Guidelines for People with Diabetes:Non Diabetic:  <6%Goal of Therapy: <7%Additional Action Suggested:  >8%   09/22/2015 10:17 AM 5.4 4.6 - 6.5 % Final     Comment:    Glycemic Control Guidelines for People with Diabetes:Non Diabetic:  <6%Goal of Therapy: <7%Additional Action Suggested:  >8%     CBG: No results for input(s): GLUCAP in the last 168 hours.   Past Medical History  She,  has a past medical history of Allergic rhinitis, Anemia, Anxiety, Arthritis, Bilateral cataracts, Chicken pox, Cholelithiasis (05/21/2014), COPD (chronic obstructive pulmonary disease) ([place]), Depression, Gout, History of blood transfusion, Hot flashes, migraines (90's), Hyperlipidemia, Hypertension, IBS (irritable bowel syndrome), Liver cyst, Macrocytosis, Osteopenia, Pneumonia, PONV (postoperative nausea and vomiting), S/P right THA, AA -  Dr. 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   Surgical History    Past Surgical History:  Procedure Laterality Date  . CARPAL TUNNEL RELEASE  80's   right   . COLONOSCOPY    . cyst removed  >24yrs ago   from left leg  . DILATION AND CURETTAGE OF UTERUS     at age 72  . EXCISIONAL TOTAL KNEE ARTHROPLASTY Right 06/03/2014   Procedure: RIGHT KNEE SCAR EXCISION;  Surgeon: [name], MD;  Location: [facility];  Service: Orthopedics;  Laterality: Right;  . INGUINAL HERNIA REPAIR  2000  . ROTATOR CUFF REPAIR  2001   right  . TONSILLECTOMY  as a child   and adenoids  . TOTAL ABDOMINAL HYSTERECTOMY  2003   benign tumor-[facility]  . TOTAL HIP ARTHROPLASTY  2011   left  . TOTAL HIP ARTHROPLASTY  03/07/2012   Procedure: TOTAL HIP ARTHROPLASTY ANTERIOR APPROACH;  Surgeon: [name], MD;  Location: [facility];  Service: Orthopedics;  Laterality: Right;  . TOTAL KNEE ARTHROPLASTY Right 01/02/2013   Procedure: RIGHT TOTAL KNEE ARTHROPLASTY;  Surgeon: [name], MD;  Location: [facility];  Service: Orthopedics;  Laterality: Right;  . TOTAL KNEE ARTHROPLASTY Left 04/03/2013   Procedure: LEFT TOTAL KNEE ARTHROPLASTY;  Surgeon: [name], MD;  Location: [facility];  Service:  Orthopedics;  Laterality: Left;  Marland Kitchen VIDEO BRONCHOSCOPY  10/20/2011   Procedure: VIDEO BRONCHOSCOPY;  Surgeon: Grace Isaac, MD;  Location: [facility];  Service: Thoracic;  Laterality: N/A;  . WEDGE RESECTION  10-20-2011   rt lung  - for lung cancer     Social History   reports that she quit smoking about 8 years ago. Her smoking use included cigarettes. She has a 25.00 pack-year smoking history. She has never used smokeless tobacco. She reports current alcohol use. She reports that she does not use drugs.   Family History   Her family history includes Alcohol abuse in an other family member; Arthritis in her brother; Bowel Disease (age of onset: 29) in her father; Depression in an other family member; Hearing loss in an other family member; Heart attack in her father; Heart disease in an other family member; Hypertension in her brother and another family member; Other in her mother; Post-traumatic stress disorder in her brother. There is no history of Anesthesia problems, Hypotension, Malignant hyperthermia, Pseudochol deficiency, or Breast cancer.   Allergies Allergies  Allergen Reactions  . Clindamycin/Lincomycin Nausea And Vomiting  . Penicillins Rash       . Spiriva [Tiotropium Bromide Monohydrate]     General intolerance  . Sulfa Antibiotics Hives and Rash    Severe Hives with skin reaction     Home Medications  Prior to Admission medications   Medication Sig Start Date End Date Taking? Authorizing Provider  acetaminophen (TYLENOL) 325 MG tablet Take 325 mg by mouth every 6 (six) hours as needed for moderate pain. Alternates with Ibuprofen   Yes [provider]  amLODipine (NORVASC) 5 MG tablet TAKE 1 TABLET BY MOUTH AT BEDTIME Patient taking differently: Take 5 mg by mouth daily. 08/31/19  Yes Koberlein, Steele Berg, MD  Budeson-Glycopyrrol-Formoterol (BREZTRI AEROSPHERE) 160-9-4.8 MCG/ACT AERO Inhale 2 puffs into the lungs 2 (two) times daily. 04/25/20  Yes Brand Males,  MD  cholecalciferol (VITAMIN D3) 25 MCG (1000 UNIT) tablet Take 1,000 Units by mouth daily.   Yes [provider]  FLUoxetine (PROZAC) 20 MG capsule Take  1 capsule by mouth twice daily Patient taking differently: Take 20 mg by mouth 2 (two) times daily. 06/17/20  Yes Koberlein, Steele Berg, MD  PROAIR HFA 108 (90 Base) MCG/ACT inhaler INHALE 2 PUFFS BY MOUTH EVERY 6 HOURS AS NEEDED FOR WHEEZING OR SHORTNESS OF BREATH Patient taking differently: Inhale 2 puffs into the lungs every 6 (six) hours as needed for wheezing or shortness of breath. INHALE 2 PUFFS BY MOUTH EVERY 6 HOURS AS NEEDED FOR WHEEZING OR SHORTNESS OF BREATH 04/15/20  Yes Brand Males, MD  rosuvastatin (CRESTOR) 5 MG tablet Take 1 tablet by mouth once daily Patient taking differently: Take 5 mg by mouth daily. 06/17/20  Yes Koberlein, Steele Berg, MD  vitamin B-12 (CYANOCOBALAMIN) 1000 MCG tablet Take 1,000 mcg by mouth daily.    Yes [provider]  vitamin E 400 UNIT capsule Take 400 Units by mouth daily.   Yes [provider]     Magdalen Spatz, MSN, AGACNP-BC [place] for personal pager 07/22/2020 2:19 PM

## 2020-07-22 NOTE — Consult Note (Signed)
Medical Consultation   [name]  IBB:[id number]  DOB: [birth year]  DOA: 07/18/2020  PCP: Caren Macadam, MD    Requesting physician:   Reason for consultation: Multiple medical reasons  Covid vaccination; vaccinated   History of Present Illness: [name]  79 y.o. WF PMHx COPD, stage I lung CA s/p lobectomy chronic respiratory failure on home O2 noncompliant with its use partly because the concentrator is too loud, HTN, HLD, depression   who underwent hernia repair 07/18/2020 and later developed wheezing and hypoxia on POD #1 delaying discharge for which medicine is consulted.  The patient reports her wheezing was constant and moderate for her, came on gradually, was significantly improved with albuterol breathing treatments at 11am and ~4pm. She wishes to discharge as soon as possible. Says she has IS and oxygen at home that she doesn't regularly use, partly because the concentrator is too loud.   Carries diagnosis of moderate COPD by PFTs Feb 2017 (FEV 1 56%, FVC 87%, ratio 49, DLCO 43%. Moderate emphysema on imaging. Uses albuterol ~2 times per week. Has received covid vaccinations (x3) and flu vaccination this year. Has no sick contacts. Does have history of Pneumococcal pneumonia not requiring hospitalization years ago.  12/13 afebrile overnight A/O x1 (does not know where, when, why).  Patient is hallucinating seeing people that are not in the room.  12/14 afebrile overnight A/O x1 (does not know where, when, why) patient extremely somnolent, difficult to arouse.  Does not follow commands,   Review of Systems: Denies fever, chills, headache, cough, sore throat, chest pain, palpitations, shortness of breath, abdominal pain, nausea, vomiting, changes in bowel habits, blood in stool, change in bladder habits, myalgias, arthralgias, rash, and per HPI. All others reviewed and are negative.  Covid vaccination; Review of Systems:  Review  of Systems  Constitutional: Negative.   HENT: Negative.   Eyes: Negative.   Respiratory: Positive for shortness of breath. Negative for cough, hemoptysis, sputum production and wheezing.   Cardiovascular: Negative.   Gastrointestinal: Negative.   Genitourinary: Negative.   Skin: Negative.   Neurological: Positive for sensory change.  Psychiatric/Behavioral: Positive for hallucinations and memory loss.   As per HPI otherwise 10 point review of systems negative.    Past Medical History: Past Medical History:  Diagnosis Date  . Allergic rhinitis   . Anemia   . Anxiety   . Arthritis    all over  . Bilateral cataracts   . Chicken pox   . Cholelithiasis 05/21/2014   On CT scan 2015   . COPD (chronic obstructive pulmonary disease) ([place])   . Depression    hx ocd and anxiety in record as well  . Gout    last time 6-55yrs ago   . History of blood transfusion   . Hot flashes    takes Prozac daily  . Hx of migraines 90's   after menopause HA stopped  . Hyperlipidemia    taking Flax Seed Oil and Fish Oil  . Hypertension    takes Amlodipine nightly  . IBS (irritable bowel syndrome)   . Liver cyst   . Macrocytosis   . Osteopenia    takes Calcium and Vit D bid  . Pneumonia    x 2 ;last time back in 1999  . PONV (postoperative nausea and vomiting)   . 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 S/P  right TKA 01/02/2013  . Seasonal allergies   . Smoking 09/12/2011    Past Surgical History: Past Surgical History:  Procedure Laterality Date  . CARPAL TUNNEL RELEASE  80's   right   . COLONOSCOPY    . cyst removed  >47yrs ago   from left leg  . DILATION AND CURETTAGE OF UTERUS     at age 63  . EXCISIONAL TOTAL KNEE ARTHROPLASTY Right 06/03/2014   Procedure: RIGHT KNEE SCAR EXCISION;  Surgeon: [name], MD;  Location: [facility];  Service: Orthopedics;  Laterality: Right;  . INGUINAL HERNIA REPAIR  2000  . ROTATOR CUFF REPAIR  2001   right  . TONSILLECTOMY  as a  child   and adenoids  . TOTAL ABDOMINAL HYSTERECTOMY  2003   benign tumor-[facility]  . TOTAL HIP ARTHROPLASTY  2011   left  . TOTAL HIP ARTHROPLASTY  03/07/2012   Procedure: TOTAL HIP ARTHROPLASTY ANTERIOR APPROACH;  Surgeon: [name], MD;  Location: [facility];  Service: Orthopedics;  Laterality: Right;  . TOTAL KNEE ARTHROPLASTY Right 01/02/2013   Procedure: RIGHT TOTAL KNEE ARTHROPLASTY;  Surgeon: [name], MD;  Location: [facility];  Service: Orthopedics;  Laterality: Right;  . TOTAL KNEE ARTHROPLASTY Left 04/03/2013   Procedure: LEFT TOTAL KNEE ARTHROPLASTY;  Surgeon: [name], MD;  Location: [facility];  Service: Orthopedics;  Laterality: Left;  Marland Kitchen VIDEO BRONCHOSCOPY  10/20/2011   Procedure: VIDEO BRONCHOSCOPY;  Surgeon: Grace Isaac, MD;  Location: [facility];  Service: Thoracic;  Laterality: N/A;  . WEDGE RESECTION  10-20-2011   rt lung  - for lung cancer     Allergies:   Allergies  Allergen Reactions  . Clindamycin/Lincomycin Nausea And Vomiting  . Penicillins Rash       . Spiriva [Tiotropium Bromide Monohydrate]     General intolerance  . Sulfa Antibiotics Hives and Rash    Severe Hives with skin reaction     Social History:  reports that she quit smoking about 8 years ago. Her smoking use included cigarettes. She has a 25.00 pack-year smoking history. She has never used smokeless tobacco. She reports current alcohol use. She reports that she does not use drugs.   Family History: Family History  Problem Relation Age of Onset  . Other Mother        polyarteritis  . Bowel Disease Father 15       colectomy/bowel obstruction issues  . Heart attack Father   . Hypertension Brother   . Arthritis Brother   . Post-traumatic stress disorder Brother   . Alcohol abuse Other   . Depression Other   . Hearing loss Other   . Heart disease Other   . Hypertension Other   . Anesthesia problems Neg Hx   . Hypotension Neg Hx   . Malignant hyperthermia Neg Hx   . Pseudochol  deficiency Neg Hx   . Breast cancer Neg Hx      I have personally reviewed and interpreted all radiology studies and my findings are as above.  VENTILATOR SETTINGS: Room air 12/14 SPO2 97%  Cultures Routine blood pending\   Antimicrobials: Anti-infectives (From admission, onward)   Start     Ordered Stop   07/18/20 0600  clindamycin (CLEOCIN) IVPB 900 mg       "And" Linked Group Details   07/17/20 0945 07/18/20 0918   07/18/20 0600  gentamicin (GARAMYCIN) 270 mg in dextrose 5 % 100 mL IVPB       "  And" Linked Group Details   07/17/20 0945 07/18/20 0935       Devices    LINES / TUBES:      Continuous Infusions: . sodium chloride       Physical Exam: Vitals:   07/22/20 0529 07/22/20 0642 07/22/20 0816 07/22/20 0855  BP: 109/76     Pulse: 97     Resp: 18     Temp: 97.9 F (36.6 C)     TempSrc: Oral     SpO2: 95% 96% 96% 97%  Weight:      Height:        General: A/O x1 (does not know where, when, why) positive chronic respiratory distress Eyes: negative scleral hemorrhage, negative anisocoria, negative icterus ENT: Negative Runny nose, negative gingival bleeding, Neck:  Negative scars, masses, torticollis, lymphadenopathy, JVD Lungs: decreased breath sounds bilaterally consistent with emphysema and lobectomy, bilaterally without wheezes or crackles Cardiovascular: Regular rate and rhythm without murmur gallop or rub normal S1 and S2 Abdomen: negative abdominal pain, nondistended, positive soft, bowel sounds, no rebound, no ascites, no appreciable mass, binder in place did not remove Extremities: No significant cyanosis, clubbing, or edema bilateral lower extremities Skin: Negative rashes, lesions, ulcers Psychiatric:  Negative depression, negative anxiety, negative fatigue, negative mania  Central nervous system:  Cranial nerves II through XII intact, tongue/uvula midline, all extremities muscle strength 5/5, sensation intact throughout,  negative  dysarthria, negative expressive aphasia, negative receptive aphasia.  Data reviewed:  I have personally reviewed following labs and imaging studies Labs:  CBC: Recent Labs  Lab 07/16/20 1500 07/19/20 0542 07/21/20 0455 07/22/20 0458  WBC 6.0 8.6 11.9* 10.1  NEUTROABS  --   --  10.9* 8.7*  HGB 15.0 12.4 11.7* 10.8*  HCT 43.8 37.1 34.3* 31.4*  MCV 96.7 98.4 96.3 96.6  PLT 200 156 173 119    Basic Metabolic Panel: Recent Labs  Lab 07/16/20 1500 07/19/20 0542 07/20/20 0510 07/21/20 0455 07/22/20 0458  NA 134* 133* 132* 133* 135  K 4.0 4.5 3.8 3.4* 3.8  CL 100 100 97* 95* 98  CO2 26 23 26 26 27   GLUCOSE 85 96 110* 129* 125*  BUN 9 14 10 14 16   CREATININE 0.93 1.06* 0.81 0.78 0.67  CALCIUM 9.6 8.7* 8.7* 8.6* 8.9  MG  --  2.1  --  1.9 2.0  PHOS  --   --  2.5 2.7 2.1*   GFR Estimated Creatinine Clearance: 46.6 mL/min (by C-G formula based on SCr of 0.67 mg/dL). Liver Function Tests: Recent Labs  Lab 07/20/20 0510 07/21/20 0455 07/22/20 0458  AST  --  48* 66*  ALT  --  31 44  ALKPHOS  --  39 39  BILITOT  --  1.7* 1.6*  PROT  --  5.8* 6.3*  ALBUMIN 3.8 3.6 4.1   No results for input(s): LIPASE, AMYLASE in the last 168 hours. No results for input(s): AMMONIA in the last 168 hours. Coagulation profile No results for input(s): INR, PROTIME in the last 168 hours.  Cardiac Enzymes: No results for input(s): CKTOTAL, CKMB, CKMBINDEX, TROPONINI in the last 168 hours. BNP: Invalid input(s): POCBNP CBG: No results for input(s): GLUCAP in the last 168 hours. D-Dimer No results for input(s): DDIMER in the last 72 hours. Hgb A1c No results for input(s): HGBA1C in the last 72 hours. Lipid Profile No results for input(s): CHOL, HDL, LDLCALC, TRIG, CHOLHDL, LDLDIRECT in the last 72 hours. Thyroid function studies No results for input(s): TSH,  T4TOTAL, T3FREE, THYROIDAB in the last 72 hours.  Invalid input(s): FREET3 Anemia work up No results for input(s): VITAMINB12,  FOLATE, FERRITIN, TIBC, IRON, RETICCTPCT in the last 72 hours. Urinalysis    Component Value Date/Time   COLORURINE AMBER (A) 05/27/2014 1444   APPEARANCEUR CLOUDY (A) 05/27/2014 1444   LABSPEC 1.025 05/27/2014 1444   PHURINE 5.0 05/27/2014 1444   GLUCOSEU NEGATIVE 05/27/2014 1444   HGBUR NEGATIVE 05/27/2014 1444   BILIRUBINUR NEGATIVE 05/27/2014 1444   KETONESUR NEGATIVE 05/27/2014 1444   PROTEINUR NEGATIVE 05/27/2014 1444   UROBILINOGEN 0.2 05/27/2014 1444   NITRITE NEGATIVE 05/27/2014 1444   LEUKOCYTESUR SMALL (A) 05/27/2014 1444     Microbiology Recent Results (from the past 240 hour(s))  SARS CORONAVIRUS 2 (TAT 6-24 HRS) Nasopharyngeal Nasopharyngeal Swab     Status: None   Collection Time: 07/15/20 10:43 AM   Specimen: Nasopharyngeal Swab  Result Value Ref Range Status   SARS Coronavirus 2 NEGATIVE NEGATIVE Final    Comment: (NOTE) SARS-CoV-2 target nucleic acids are NOT DETECTED.  The SARS-CoV-2 RNA is generally detectable in upper and lower respiratory specimens during the acute phase of infection. Negative results do not preclude SARS-CoV-2 infection, do not rule out co-infections with other pathogens, and should not be used as the sole basis for treatment or other patient management decisions. Negative results must be combined with clinical observations, patient history, and epidemiological information. The expected result is Negative.  Fact Sheet for Patients: SugarRoll.be  Fact Sheet for Healthcare Providers: https://www.-mathews.com/  This test is not yet approved or cleared by the [place] FDA and  has been authorized for detection and/or diagnosis of SARS-CoV-2 by FDA under an Emergency Use Authorization (EUA). This EUA will remain  in effect (meaning this test can be used) for the duration of the COVID-19 declaration under Se ction 564(b)(1) of the Act, 21 U.S.C. section 360bbb-3(b)(1), unless the  authorization is terminated or revoked sooner.  Performed at [facility], [place] [address] [place], [place] [address]        Inpatient Medications:   Scheduled Meds: . amLODipine  5 mg Oral Daily  . cholecalciferol  1,000 Units Oral Daily  . enoxaparin (LOVENOX) injection  40 mg Subcutaneous Q24H  . FLUoxetine  20 mg Oral BID  . fluticasone furoate-vilanterol  1 puff Inhalation Daily   And  . umeclidinium bromide  1 puff Inhalation Daily  . ipratropium-albuterol  3 mL Nebulization Q6H  . phosphorus  500 mg Oral BID  . polyethylene glycol  17 g Oral BID  . rosuvastatin  5 mg Oral Daily  . sodium chloride flush  3 mL Intravenous Q12H  . vitamin B-12  1,000 mcg Oral Daily   Continuous Infusions: . sodium chloride       Radiological Exams on Admission: CT HEAD WO CONTRAST  Result Date: 07/21/2020 CLINICAL DATA:  Stroke suspected. Patient with acute onset of cognitive change evaluate for stroke. Fall last night, headache. EXAM: CT HEAD WITHOUT CONTRAST TECHNIQUE: Contiguous axial images were obtained from the base of the skull through the vertex without intravenous contrast. COMPARISON:  Prior head CT examinations 07/08/2020 and earlier. Brain MRI 10/06/2011. FINDINGS: Brain: Moderate generalized cerebral atrophy. Comparatively mild cerebellar atrophy. Mild ill-defined hypoattenuation within the cerebral white matter is nonspecific, but compatible with chronic small vessel ischemic disease. There is no acute intracranial hemorrhage. No demarcated cortical infarct. No extra-axial fluid collection. No evidence of intracranial mass. No midline shift. Vascular: No hyperdense vessel.  Atherosclerotic calcifications Skull: Normal. Negative for fracture or focal lesion. Sinuses/Orbits: Visualized orbits show no acute finding. No significant paranasal sinus disease at the imaged levels. IMPRESSION: No evidence of acute intracranial abnormality. Moderate cerebral atrophy and mild chronic  small vessel ischemic disease, stable as compared to the head CT of 07/08/2020. Electronically Signed   By: Kellie Simmering DO   On: 07/21/2020 09:23    Impression/Recommendations Principal Problem:   Acute delirium Active Problems:   Hypertension   Moderate COPD (chronic obstructive pulmonary disease), Sees Dr. 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  Patient had removed her O2.  Become confused, trying to get out of the bed, not making sense to the ED staff. -Patient placed back on 2 L O2 nasal cannula and encephalopathy resolved. -Counseled patient at length that given her significant lung disease she had ZERO reserve, to remove her oxygen during the day or at night.  Patient will require O2 24 hours a day. -12/13 CT head pending; R/O CVA -12/13 blood cultures, urine culture pending -12/14 decrease Haldol 1 mg BID secondary to patient's somnolence -12/14 Robaxin 500 mg BID PRN (hold) secondary to patient's somnolence -12/14 tramadol 50 mg q 12 hr PRN (hold) secondary to patient's tolerance  -12/14 1.  Out of bed to chair q shift   2.  During the daytime all lights on, and shades open  Acute on chronic respiratory failure with hypoxia/COPD Gold II - Primarily suspect postoperative atelectasis based on CXR finding in absence of fever, new cough, etc. Fluid overload may have contributed which was treated with lasix x1. Underlying COPD seems to have made it easier to to tip into respiratory failure, and may also be slightly exacerbated.  - Encourage use of incentive spirometry, instructed on its use. Pt has close friend at bedside who will continue to enforce this strong recommendation.  - OOB as much as possible. - Add duonebs scheduled, albuterol prn - Continue maintenance bronchodilators -  Avoid steroids postoperatively unless wheezing recurs despite inhaled Tx. Pt does not regularly require prednisone as outpatient.  - Supplement oxygen 24 hours a day to minimize long term pulmonary HTN, and episodes of acute encephalopathy.  -12/2 ambulatory SPO2 pending. - Follow up with pulmonary after DC, Dr. Chase Caller - Maintain VTE ppx - If fever develops, strongly consider repeat CXR and coverage for aspiration PNA. This does not appear likely at this time. -12/13 decrease Robaxin 500 mg BID PRN -12/13 decrease tramadol 50 mg QID PRN -Incentive spirometry -Flutter valve -DuoNeb QID  History of stage I lung CA  -S/p lobectomy 2013 by Dr. Servando Snare and followed by Dr. Julien Nordmann under surveillance with no known evidence of recurrence.   Stage IIIb CKD:  - Monitor Cr and electrolytes in AM following lasix. Lab Results  Component Value Date   CREATININE 0.67 07/22/2020   CREATININE 0.78 07/21/2020   CREATININE 0.81 07/20/2020   CREATININE 1.06 (H) 07/19/2020   CREATININE 0.93 07/16/2020  -Back to normal.   S/p Llaparoscopic bilateral spigelian hernias and umbilical hernia repair with mesh 07/18/2020 by Dr. Johney Maine:  - Per primary.  - Minimize narcotics as able  Hypokalemia -Potassium goal> 4  Goals of care -12/13 PT/OT consult;Evaluate for CIR vs SNF   Thank you for this consultation.  Our [facility] hospitalist team  will follow the patient with you.   Time Spent: 35 min  Cadey Bazile J M.D. [facility] 07/22/2020, 12:17 PM  [phone]

## 2020-07-22 NOTE — Progress Notes (Signed)
RN note: pt. Confused and combative most of shift. Reoriented and repositioned frequently (every 15-30 minutes). Pt. Attempted to spit at RN at times, legs found over bedrails several times. Provided decreased stimuli in enviornment without any improvement. Restraint continue to in place, see restraint documentation. Provided pericare and oral care. Prn haldol given per order, minimal effective. Pt. Continues to have audible wheezing, VSS. No distress noted at this time. Will continue to monitor pt. Closely. Neomia Dear, RN

## 2020-07-23 LAB — CBC WITH DIFFERENTIAL/PLATELET
Abs Immature Granulocytes: 0.04 10*3/uL (ref 0.00–0.07)
Basophils Absolute: 0.1 10*3/uL (ref 0.0–0.1)
Basophils Relative: 1 %
Eosinophils Absolute: 0.2 10*3/uL (ref 0.0–0.5)
Eosinophils Relative: 3 %
HCT: 32.4 % — ABNORMAL LOW (ref 36.0–46.0)
Hemoglobin: 10.8 g/dL — ABNORMAL LOW (ref 12.0–15.0)
Immature Granulocytes: 1 %
Lymphocytes Relative: 12 %
Lymphs Abs: 1 10*3/uL (ref 0.7–4.0)
MCH: 32.8 pg (ref 26.0–34.0)
MCHC: 33.3 g/dL (ref 30.0–36.0)
MCV: 98.5 fL (ref 80.0–100.0)
Monocytes Absolute: 0.7 10*3/uL (ref 0.1–1.0)
Monocytes Relative: 9 %
Neutro Abs: 6.1 10*3/uL (ref 1.7–7.7)
Neutrophils Relative %: 74 %
Platelets: 201 10*3/uL (ref 150–400)
RBC: 3.29 MIL/uL — ABNORMAL LOW (ref 3.87–5.11)
RDW: 12.2 % (ref 11.5–15.5)
WBC: 8.2 10*3/uL (ref 4.0–10.5)
nRBC: 0 % (ref 0.0–0.2)

## 2020-07-23 LAB — PHOSPHORUS: Phosphorus: 3.4 mg/dL (ref 2.5–4.6)

## 2020-07-23 LAB — COMPREHENSIVE METABOLIC PANEL
ALT: 39 U/L (ref 0–44)
AST: 41 U/L (ref 15–41)
Albumin: 3.5 g/dL (ref 3.5–5.0)
Alkaline Phosphatase: 44 U/L (ref 38–126)
Anion gap: 9 (ref 5–15)
BUN: 16 mg/dL (ref 8–23)
CO2: 31 mmol/L (ref 22–32)
Calcium: 8.7 mg/dL — ABNORMAL LOW (ref 8.9–10.3)
Chloride: 98 mmol/L (ref 98–111)
Creatinine, Ser: 0.75 mg/dL (ref 0.44–1.00)
GFR, Estimated: >60 mL/min (ref >60)
Glucose, Bld: 105 mg/dL — ABNORMAL HIGH (ref 70–99)
Potassium: 3.4 mmol/L — ABNORMAL LOW (ref 3.5–5.1)
Sodium: 138 mmol/L (ref 135–145)
Total Bilirubin: 1.4 mg/dL — ABNORMAL HIGH (ref 0.3–1.2)
Total Protein: 6.1 g/dL — ABNORMAL LOW (ref 6.5–8.1)

## 2020-07-23 LAB — MAGNESIUM: Magnesium: 2.3 mg/dL (ref 1.7–2.4)

## 2020-07-23 MED ORDER — POLYETHYLENE GLYCOL 3350 17 G PO PACK
17.0000 g | PACK | Freq: Every day | ORAL | Status: DC
  Administered 2020-07-24: 17 g via ORAL
  Filled 2020-07-23 (×2): qty 1

## 2020-07-23 MED ORDER — ACETAMINOPHEN 325 MG PO TABS
650.0000 mg | ORAL_TABLET | Freq: Four times a day (QID) | ORAL | Status: DC
  Administered 2020-07-23 – 2020-07-24 (×4): 650 mg via ORAL
  Filled 2020-07-23 (×4): qty 2

## 2020-07-23 MED ORDER — POTASSIUM CHLORIDE 20 MEQ PO PACK
40.0000 meq | PACK | Freq: Once | ORAL | Status: AC
  Administered 2020-07-23: 40 meq via ORAL
  Filled 2020-07-23: qty 2

## 2020-07-23 NOTE — Care Management Important Message (Signed)
Important Message  Patient Details IM Letter given to the Patient. Name: [name] MRN: [id number] Date of Birth: Aug 23, 1940   Medicare Important Message Given:  Yes     Kerin Salen 07/23/2020, 1:35 PM

## 2020-07-23 NOTE — Progress Notes (Signed)
PROGRESS NOTE    [name]  IFO:[id number] DOB: January 28, 1941 DOA: 07/18/2020 PCP: Caren Macadam, MD    Brief Narrative: This 79 yrs old female with PMH of COPD, stage I lung CA status post lobectomy, chronic respiratory failure on home oxygen but noncompliant with its use, hypertension, hyperlipidemia, depression who was admitted under general surgical service for hernia repair and has developed wheezing and hypoxia on postoperative day 1.  She has developed altered mental status secondary to hypoxia which has been improving.  Assessment & Plan:   Principal Problem:   Acute delirium Active Problems:   Hypertension   Moderate COPD (chronic obstructive pulmonary disease), Sees Dr. 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 to acute on chronic hypoxic respiratory failure.   Patient had removed her O2, she became confused, trying to get out of the bed, not making sense to the ED staff. Patient placed back on 2 L O2 nasal cannula and encephalopathy resolved. Counseled patient at length that given her significant lung disease,  she had ZERO reserve,  She should not remove her oxygen during the day or at night.  Patient will require O2 24 hours a day. CT head: unremarkable. R/O CVA Blood cultures no growth so far. Decreased Haldol 1 mg BID secondary to patient's somnolence Robaxin 500 mg BID PRN (held) secondary to patient's somnolence Tramadol 50 mg q 12 hr PRN (held) secondary to patient's tolerance  Encourage out of bed to chair q shift  Keep during the daytime all lights on, and shades open.  Acute on chronic respiratory failure with hypoxia /COPD Gold II Suspect postoperative atelectasis based on CXR finding in absence of fever, new cough,  etc.  Fluid overload may have contributed which was treated with lasix .  Underlying COPD seems to have made it easier to have respiratory failure, and may also be slightly exacerbated.  Encourage use of incentive spirometry, instructed on its use.  Pt has close friend at bedside who will continue to enforce this strong recommendation.  OOB as much as possible. Continue  duonebs scheduled, albuterol prn Continue maintenance bronchodilators Avoid steroids postoperatively unless wheezing recurs despite inhaled Tx.  Supplement oxygen 24 hours a day to minimize long term pulmonary HTN, and episodes of acute encephalopathy.  Check ambulatory SPO2 -  pending. Follow up with pulmonary after DC, Dr. Chase Caller Maintain VTE ppx If fever develops, strongly consider repeat CXR and coverage for aspiration PNA.  -Incentive spirometry -Flutter valve -DuoNeb QID  History of stage I lung CA  -S/p lobectomy 2013 by Dr. Servando Snare and followed by Dr. Julien Nordmann under surveillance with no known evidence of recurrence.   Stage IIIb CKD:  - Monitor Cr and electrolytes in AM following lasix. -Renal functions back to baseline.   S/p Laparoscopic bilateral spigelian hernias and umbilical hernia repair with mesh 07/18/2020 by Dr. Johney Maine:  - Per primary.  - Minimize narcotics as able.  Hypokalemia -Potassium goal> 4  Goals of care -12/13 PT/OT consult;Evaluate for CIR vs SNF    DVT prophylaxis: Lovenox Code Status: Full code. Family Communication: Friend at bedside. Disposition Plan:    Procedures: Elective Hernia repair. Antimicrobials:  Anti-infectives (From admission, onward)   Start     Dose/Rate Route Frequency Ordered Stop  07/18/20 0600  clindamycin (CLEOCIN) IVPB 900 mg       "And" Linked Group Details   900 mg 100 mL/hr over 30 Minutes Intravenous On call to O.R. 07/17/20 0945 07/18/20 0918   07/18/20 0600  gentamicin (GARAMYCIN) 270 mg in dextrose 5 % 100 mL IVPB       "And"  Linked Group Details   5 mg/kg  54.9 kg 213.5 mL/hr over 30 Minutes Intravenous On call to O.R. 07/17/20 0945 07/18/20 0935      Subjective: Patient was seen and examined at bedside.  Overnight events noted.  Patient appears to be at her baseline mental status.   She is on 2 L supplemental oxygen saturating 96%.  Objective: Vitals:   07/22/20 2041 07/23/20 0547 07/23/20 0804 07/23/20 1337  BP:  116/87  104/63  Pulse:  80  93  Resp:  18  16  Temp:  97.9 F (36.6 C)  98 F (36.7 C)  TempSrc:  Oral  Oral  SpO2: 93% 96% 94% 93%  Weight:      Height:        Intake/Output Summary (Last 24 hours) at 07/23/2020 1455 Last data filed at 07/23/2020 1400 Gross per 24 hour  Intake 1200 ml  Output 0 ml  Net 1200 ml   Filed Weights   07/18/20 0738 07/19/20 1120  Weight: 54.9 kg 54 kg    Examination:  General exam: Appears calm and comfortable , not in any acute distress. Respiratory system: Clear to auscultation. Respiratory effort normal.  Decreased breath sounds bilaterally. Cardiovascular system: S1 & S2 heard, RRR. No JVD, murmurs, rubs, gallops or clicks. No pedal edema. Gastrointestinal system: Abdomen is nondistended, soft and nontender. No organomegaly or masses felt.  Normal bowel sounds heard. Central nervous system: Alert and oriented. No focal neurological deficits. Extremities: No edema, no cyanosis, no clubbing. Skin: No rashes, lesions or ulcers Psychiatry: Judgement and insight appear normal. Mood & affect appropriate.     Data Reviewed: I have personally reviewed following labs and imaging studies  CBC: Recent Labs  Lab 07/16/20 1500 07/19/20 0542 07/21/20 0455 07/22/20 0458 07/23/20 0546  WBC 6.0 8.6 11.9* 10.1 8.2  NEUTROABS  --   --  10.9* 8.7* 6.1  HGB 15.0 12.4 11.7* 10.8* 10.8*  HCT 43.8 37.1 34.3* 31.4* 32.4*  MCV 96.7 98.4 96.3 96.6 98.5  PLT 200 156 173 170 166   Basic Metabolic Panel: Recent Labs  Lab 07/19/20 0542 07/20/20 0510  07/21/20 0455 07/22/20 0458 07/23/20 0546  NA 133* 132* 133* 135 138  K 4.5 3.8 3.4* 3.8 3.4*  CL 100 97* 95* 98 98  CO2 23 26 26 27 31   GLUCOSE 96 110* 129* 125* 105*  BUN 14 10 14 16 16   CREATININE 1.06* 0.81 0.78 0.67 0.75  CALCIUM 8.7* 8.7* 8.6* 8.9 8.7*  MG 2.1  --  1.9 2.0 2.3  PHOS  --  2.5 2.7 2.1* 3.4   GFR: Estimated Creatinine Clearance: 46.6 mL/min (by C-G formula based on SCr of 0.75 mg/dL). Liver Function Tests: Recent Labs  Lab 07/20/20 0510 07/21/20 0455 07/22/20 0458 07/23/20 0546  AST  --  48* 66* 41  ALT  --  31 44 39  ALKPHOS  --  39 39 44  BILITOT  --  1.7* 1.6* 1.4*  PROT  --  5.8* 6.3* 6.1*  ALBUMIN 3.8 3.6 4.1 3.5   No results for input(s): LIPASE, AMYLASE in the last 168 hours. No results  for input(s): AMMONIA in the last 168 hours. Coagulation Profile: No results for input(s): INR, PROTIME in the last 168 hours. Cardiac Enzymes: No results for input(s): CKTOTAL, CKMB, CKMBINDEX, TROPONINI in the last 168 hours. BNP (last 3 results) No results for input(s): PROBNP in the last 8760 hours. HbA1C: No results for input(s): HGBA1C in the last 72 hours. CBG: No results for input(s): GLUCAP in the last 168 hours. Lipid Profile: No results for input(s): CHOL, HDL, LDLCALC, TRIG, CHOLHDL, LDLDIRECT in the last 72 hours. Thyroid Function Tests: No results for input(s): TSH, T4TOTAL, FREET4, T3FREE, THYROIDAB in the last 72 hours. Anemia Panel: No results for input(s): VITAMINB12, FOLATE, FERRITIN, TIBC, IRON, RETICCTPCT in the last 72 hours. Sepsis Labs: No results for input(s): PROCALCITON, LATICACIDVEN in the last 168 hours.  Recent Results (from the past 240 hour(s))  SARS CORONAVIRUS 2 (TAT 6-24 HRS) Nasopharyngeal Nasopharyngeal Swab     Status: None   Collection Time: 07/15/20 10:43 AM   Specimen: Nasopharyngeal Swab  Result Value Ref Range Status   SARS Coronavirus 2 NEGATIVE NEGATIVE Final    Comment: (NOTE) SARS-CoV-2 target nucleic  acids are NOT DETECTED.  The SARS-CoV-2 RNA is generally detectable in upper and lower respiratory specimens during the acute phase of infection. Negative results do not preclude SARS-CoV-2 infection, do not rule out co-infections with other pathogens, and should not be used as the sole basis for treatment or other patient management decisions. Negative results must be combined with clinical observations, patient history, and epidemiological information. The expected result is Negative.  Fact Sheet for Patients: SugarRoll.be  Fact Sheet for Healthcare Providers: https://www.woods-mathews.com/  This test is not yet approved or cleared by the [place] FDA and  has been authorized for detection and/or diagnosis of SARS-CoV-2 by FDA under an Emergency Use Authorization (EUA). This EUA will remain  in effect (meaning this test can be used) for the duration of the COVID-19 declaration under Se ction 564(b)(1) of the Act, 21 U.S.C. section 360bbb-3(b)(1), unless the authorization is terminated or revoked sooner.  Performed at [facility], [place] [address], [place], [place] [address]   Culture, blood (routine x 2)     Status: None (Preliminary result)   Collection Time: 07/21/20  8:18 PM   Specimen: BLOOD  Result Value Ref Range Status   Specimen Description   Final    BLOOD RIGHT ARM Performed at [facility] [address], [place], [place] [address]    Special Requests   Final    BOTTLES DRAWN AEROBIC ONLY Blood Culture adequate volume Performed at [facility] [address], [place], [place] [address]    Culture   Final    NO GROWTH 2 DAYS Performed at [facility] [address], [place], [place] [address]    Report Status PENDING  Incomplete  Culture, blood (routine x 2)     Status: None (Preliminary result)   Collection Time: 07/21/20  8:18 PM   Specimen: BLOOD  Result  Value Ref Range Status   Specimen Description   Final    BLOOD LEFT ARM Performed at [facility] [address], [place], [place] [address]    Special Requests   Final    BOTTLES DRAWN AEROBIC ONLY Blood Culture adequate volume Performed at [facility] [address], [place], [place] [address]    Culture   Final    NO GROWTH 2 DAYS Performed at [facility]  [facility], [place] [address], [place], [place] [address]    Report Status PENDING  Incomplete     Radiology Studies: No results found.   Scheduled Meds:  acetaminophen  650 mg Oral Q6H   albuterol  2.5 mg Nebulization BID   amLODipine  5 mg Oral Daily   cholecalciferol  1,000 Units Oral Daily   enoxaparin (LOVENOX) injection  40 mg Subcutaneous Q24H   FLUoxetine  20 mg Oral BID   fluticasone furoate-vilanterol  1 puff Inhalation Daily   And   umeclidinium bromide  1 puff Inhalation Daily   haloperidol  1 mg Oral BID   phosphorus  500 mg Oral BID   polyethylene glycol  17 g Oral Daily   rosuvastatin  5 mg Oral Daily   sodium chloride flush  3 mL Intravenous Q12H   vitamin B-12  1,000 mcg Oral Daily   Continuous Infusions:  sodium chloride       LOS: 3 days    Time spent: 25 mins    Shawna Clamp, MD [facility]   If 7PM-7AM, please contact night-coverage

## 2020-07-23 NOTE — Progress Notes (Signed)
[name] [id number] 1941-04-04  CARE TEAM:  PCP: Caren Macadam, MD  Outpatient Care Team: Patient Care Team: Caren Macadam, MD as PCP - General (Family Medicine) Brand Males, MD (Pulmonary Disease) Grace Isaac, MD (Cardiothoracic Surgery) Lorretta Harp, MD as Consulting Physician (Cardiology) Curt Bears, MD as Consulting Physician (Hematology and Oncology) Michael Boston, MD as Consulting Physician (General Surgery) Michael Boston, MD as Consulting Physician (General Surgery)  Inpatient Treatment Team: Treatment Team: Attending Provider: Shawna Clamp, MD; Consulting Physician: Patrecia Pour, MD; Rounding Team: Fatima Blank, MD; Technician: Ernest Mallick, NT; Consulting Physician: Juanito Doom, MD; Consulting Physician: Michael Boston, MD; Utilization Review: Fortino Sic, RN; Registered Nurse: Jennye Boroughs, RN   Problem List:   Principal Problem:   Acute delirium Active Problems:   Hypertension   Moderate COPD (chronic obstructive pulmonary disease), Sees Dr. 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  Left side incarcerated with preperitoneal fat and some omentum.  Obvious indirect left inguinal hernia of moderate size.  No definite recurrent right inguinal hernia.  Umbilical hernia   Assessment  Hypoxia on oxygen - stabilizing  Mild/moderate delirium  [facility] Stay = 3 days)  Plan:  Hypoxia seems to be stabilized on a few  liters of oxygen.  Actually has O2 at home but doesn't like to use.   Lasix diuresis x1.  Using inhalers.  I am tempted to give another dose of Lasix but will defer to pulmonary medicine.  I am not against steroid use if that is ultimately needed.    Increased confusion suspicious for delirium, especially sundowning..  No major metabolic or anemic etiology.  CT of head ordered by medicine shows no major abnormality.  MS more calm today a hopeful sign.  Defer need for neurology evaluation to medicine service   Mild hypokalemia - correcting.  Try K-Phos and she has some hypophosphatemia as well  Trying to remobilize.  Bowel regimen.  Get on MiraLAX to make sure she is not too backed up/constipated.  Moderate ecchymosis in the flank and groins not surprising with the repairs intact.  Stable to resolving.  VTE prophylaxis- SCDs, etc  She is stable from a surgery standpoint.  Defer to medicine & pulmonary on when OK to d/c from hospital.  Awaiting medicine and pulmonary help on the patient to prove any improvement/stability on her sundowning/delirium and hypoxia/COPD.  I am skeptical patient can safely go home.  Hopefully with careful support and monitoring, can mobilize and get her into a more active daytime routine and break the cycle of sundowning and confusion.  Make sure were not missing any organic or hypoxic etiology for her delirium.  Disposition:  Disposition:  The patient is from: Home  Anticipate discharge to:  [place] signed  Anticipated Date of Discharge is:  July 23, 2020  Barriers to discharge:  Pending Clinical improvement (more likely than not - stable from surgery standpoint for d/c - awaiting pulmonary & MS improvements  Patient currently is  NOT MEDICALLY STABLE for discharge from the hospital from a surgery standpoint.   30 minutes spent in review, evaluation, examination, counseling, and coordination of care.  More than 50% of that time was  spent in counseling.  07/23/2020    Subjective: (Chief complaint)  Less agitated  C/o soreness abdomen - not asking for meds  Objective:  Vital signs:  Vitals:   07/22/20 1405 07/22/20 2040 07/22/20 2041 07/23/20 0547  BP: 104/87 129/87  116/87  Pulse: 93 92  80  Resp: 16 18  18   Temp: 98.4 F (36.9 C) 98.2 F (36.8 C)  97.9 F (36.6 C)  TempSrc: Oral Oral  Oral  SpO2: 97% 97% 93% 96%  Weight:      Height:        Last BM Date: 07/22/20  Intake/Output   Yesterday:  12/14 0701 - 12/15 0700 In: 1200 [P.O.:1200] Out: 0  This shift:  No intake/output data recorded.  Bowel function:  Flatus: YES  BM:  YES  Drain: (No drain)   Physical Exam:  General: Pt awake/alert in no acute distress.  More calm, not fidgetting.  Smiling and joking.  Moving without any splinting or hesitancy  eyes: PERRL, normal EOM.  Sclera clear.  No icterus.  Atraumatic abrasions or hematoma Neuro: CN II-XII intact w/o focal sensory/motor deficits. Lymph: No head/neck/groin lymphadenopathy Psych: Somewhat wrangling and talkative.  Answer some questions.  Friend at bedside concerned that this is not her baseline mental status.  Oriented x 3.  Will answer questions and follow commands. HENT: Normocephalic, Mucus membranes moist.  No thrush Neck: Supple, No tracheal deviation.  No obvious thyromegaly Chest: No pain to chest wall compression.  Good respiratory excursion.  No audible wheezing -mild wheezing to auscultation. CV:  Pulses intact.  Regular rhythm.  No major extremity edema MS: Normal AROM mjr joints.  No obvious deformity  Abdomen: Soft.  Nondistended.  Mildly tender at incisions only. Less ecchymosis on flank and groins.  No evidence of peritonitis.  No incarcerated hernias.   Ext:  No deformity.  No mjr edema.  No cyanosis Skin: No petechiae / purpurea.  No major sores.  Warm and dry    Results:   Cultures: Recent Results (from the past 720 hour(s))  SARS CORONAVIRUS 2  (TAT 6-24 HRS) Nasopharyngeal Nasopharyngeal Swab     Status: None   Collection Time: 07/15/20 10:43 AM   Specimen: Nasopharyngeal Swab  Result Value Ref Range Status   SARS Coronavirus 2 NEGATIVE NEGATIVE Final    Comment: (NOTE) SARS-CoV-2 target nucleic acids are NOT DETECTED.  The SARS-CoV-2 RNA is generally detectable in upper and lower respiratory specimens during the acute phase of infection. Negative results do not preclude SARS-CoV-2 infection, do not rule out co-infections with other pathogens, and should not be used as the sole basis for treatment or other patient management decisions. Negative results must be combined with clinical observations, patient history, and epidemiological information. The expected result is Negative.  Fact Sheet for Patients: SugarRoll.be  Fact Sheet for Healthcare Providers: https://www.woods-mathews.com/  This test is not yet approved or cleared by the [place] FDA and  has been authorized for detection and/or diagnosis of SARS-CoV-2 by FDA under an Emergency Use Authorization (EUA). This EUA will remain  in effect (meaning this test can be used) for the duration of the COVID-19 declaration under Se ction 564(b)(1) of the Act, 21 U.S.C. section 360bbb-3(b)(1), unless the authorization is terminated or revoked sooner.  Performed at  [facility], [place] [address], [place], [place] [address]   Culture, blood (routine x 2)     Status: None (Preliminary result)   Collection Time: 07/21/20  8:18 PM   Specimen: BLOOD  Result Value Ref Range Status   Specimen Description   Final    BLOOD RIGHT ARM Performed at [facility] [address], [place], [place] [address]    Special Requests   Final    BOTTLES DRAWN AEROBIC ONLY Blood Culture adequate volume Performed at [facility] [address] [place], [place] [address]    Culture   Final    NO GROWTH  < 24 HOURS Performed at [facility] [address], [place], [place] [address]    Report Status PENDING  Incomplete  Culture, blood (routine x 2)     Status: None (Preliminary result)   Collection Time: 07/21/20  8:18 PM   Specimen: BLOOD  Result Value Ref Range Status   Specimen Description   Final    BLOOD LEFT ARM Performed at [facility] [address] [place], [place] [address]    Special Requests   Final    BOTTLES DRAWN AEROBIC ONLY Blood Culture adequate volume Performed at [facility] [address], [place], [place] [address]    Culture   Final    NO GROWTH < 24 HOURS Performed at [facility] [address], [place], [place] [address]    Report Status PENDING  Incomplete    Labs: Results for orders placed or performed during the hospital encounter of 07/18/20 (from the past 48 hour(s))  Culture, blood (routine x 2)     Status: None (Preliminary result)   Collection Time: 07/21/20  8:18 PM   Specimen: BLOOD  Result Value Ref Range   Specimen Description      BLOOD RIGHT ARM Performed at [facility] [address] [place], [place] [address]    Special Requests      BOTTLES DRAWN AEROBIC ONLY Blood Culture adequate volume Performed at [facility] [address] [place], [place] [address]    Culture      NO GROWTH < 24 HOURS Performed at [facility] [address] [place], [place] [address]    Report Status PENDING   Culture, blood (routine x 2)     Status: None (Preliminary result)   Collection Time: 07/21/20  8:18 PM   Specimen: BLOOD  Result Value Ref Range   Specimen Description      BLOOD LEFT ARM Performed at [facility] [address] [place], [place] [address]    Special Requests      BOTTLES DRAWN AEROBIC ONLY Blood Culture adequate volume Performed at [facility] [address] [place],   [address]    Culture      NO GROWTH < 24 HOURS Performed at [facility] [address], [place],  [address]    Report Status PENDING   Comprehensive metabolic panel     Status: Abnormal   Collection Time: 07/22/20  4:58 AM  Result Value Ref Range   Sodium 135 135 - 145 mmol/L   Potassium 3.8 3.5 - 5.1 mmol/L   Chloride 98 98 - 111 mmol/L   CO2 27 22 - 32 mmol/L   Glucose, Bld 125 (H) 70 - 99 mg/dL    Comment: Glucose reference  range applies only to samples taken after fasting for at least 8 hours.   BUN 16 8 - 23 mg/dL   Creatinine, Ser 0.67 0.44 - 1.00 mg/dL   Calcium 8.9 8.9 - 10.3 mg/dL   Total Protein 6.3 (L) 6.5 - 8.1 g/dL   Albumin 4.1 3.5 - 5.0 g/dL   AST 66 (H) 15 - 41 U/L   ALT 44 0 - 44 U/L   Alkaline Phosphatase 39 38 - 126 U/L   Total Bilirubin 1.6 (H) 0.3 - 1.2 mg/dL   GFR, Estimated >60 >60 mL/min    Comment: (NOTE) Calculated using the CKD-EPI Creatinine Equation (2021)    Anion gap 10 5 - 15    Comment: Performed at [facility], [place] [address] [place], [place] [address]  Magnesium     Status: None   Collection Time: 07/22/20  4:58 AM  Result Value Ref Range   Magnesium 2.0 1.7 - 2.4 mg/dL    Comment: Performed at [facility], [place] [address], [place], [place] [address]  Phosphorus     Status: Abnormal   Collection Time: 07/22/20  4:58 AM  Result Value Ref Range   Phosphorus 2.1 (L) 2.5 - 4.6 mg/dL    Comment: Performed at [facility], [place] [address] [place], [place] [address]  CBC with Differential/Platelet     Status: Abnormal   Collection Time: 07/22/20  4:58 AM  Result Value Ref Range   WBC 10.1 4.0 - 10.5 K/uL   RBC 3.25 (L) 3.87 - 5.11 MIL/uL   Hemoglobin 10.8 (L) 12.0 - 15.0 g/dL   HCT 31.4 (L) 36.0 - 46.0 %   MCV 96.6 80.0 - 100.0 fL   MCH 33.2 26.0 - 34.0 pg   MCHC 34.4 30.0 - 36.0 g/dL   RDW 12.1 11.5 - 15.5 %   Platelets 170 150 - 400 K/uL   nRBC 0.0 0.0 - 0.2 %    Neutrophils Relative % 87 %   Neutro Abs 8.7 (H) 1.7 - 7.7 K/uL   Lymphocytes Relative 6 %   Lymphs Abs 0.6 (L) 0.7 - 4.0 K/uL   Monocytes Relative 7 %   Monocytes Absolute 0.7 0.1 - 1.0 K/uL   Eosinophils Relative 0 %   Eosinophils Absolute 0.0 0.0 - 0.5 K/uL   Basophils Relative 0 %   Basophils Absolute 0.0 0.0 - 0.1 K/uL   Immature Granulocytes 0 %   Abs Immature Granulocytes 0.04 0.00 - 0.07 K/uL    Comment: Performed at [facility], [place] [address], [place], [place] [address]  Comprehensive metabolic panel     Status: Abnormal   Collection Time: 07/23/20  5:46 AM  Result Value Ref Range   Sodium 138 135 - 145 mmol/L   Potassium 3.4 (L) 3.5 - 5.1 mmol/L   Chloride 98 98 - 111 mmol/L   CO2 31 22 - 32 mmol/L   Glucose, Bld 105 (H) 70 - 99 mg/dL    Comment: Glucose reference range applies only to samples taken after fasting for at least 8 hours.   BUN 16 8 - 23 mg/dL   Creatinine, Ser 0.75 0.44 - 1.00 mg/dL   Calcium 8.7 (L) 8.9 - 10.3 mg/dL   Total Protein 6.1 (L) 6.5 - 8.1 g/dL   Albumin 3.5 3.5 - 5.0 g/dL   AST 41 15 - 41 U/L   ALT 39 0 - 44 U/L   Alkaline Phosphatase 44 38 -  126 U/L   Total Bilirubin 1.4 (H) 0.3 - 1.2 mg/dL   GFR, Estimated >60 >60 mL/min    Comment: (NOTE) Calculated using the CKD-EPI Creatinine Equation (2021)    Anion gap 9 5 - 15    Comment: Performed at [facility], [place] [address], [place], [place] [address]  Magnesium     Status: None   Collection Time: 07/23/20  5:46 AM  Result Value Ref Range   Magnesium 2.3 1.7 - 2.4 mg/dL    Comment: Performed at [facility], [place] [address] [place], [place] [address]  Phosphorus     Status: None   Collection Time: 07/23/20  5:46 AM  Result Value Ref Range   Phosphorus 3.4 2.5 - 4.6 mg/dL    Comment: Performed at [facility], [place] [address] [place], [place] [address]  CBC with Differential/Platelet     Status: Abnormal    Collection Time: 07/23/20  5:46 AM  Result Value Ref Range   WBC 8.2 4.0 - 10.5 K/uL   RBC 3.29 (L) 3.87 - 5.11 MIL/uL   Hemoglobin 10.8 (L) 12.0 - 15.0 g/dL   HCT 32.4 (L) 36.0 - 46.0 %   MCV 98.5 80.0 - 100.0 fL   MCH 32.8 26.0 - 34.0 pg   MCHC 33.3 30.0 - 36.0 g/dL   RDW 12.2 11.5 - 15.5 %   Platelets 201 150 - 400 K/uL   nRBC 0.0 0.0 - 0.2 %   Neutrophils Relative % 74 %   Neutro Abs 6.1 1.7 - 7.7 K/uL   Lymphocytes Relative 12 %   Lymphs Abs 1.0 0.7 - 4.0 K/uL   Monocytes Relative 9 %   Monocytes Absolute 0.7 0.1 - 1.0 K/uL   Eosinophils Relative 3 %   Eosinophils Absolute 0.2 0.0 - 0.5 K/uL   Basophils Relative 1 %   Basophils Absolute 0.1 0.0 - 0.1 K/uL   Immature Granulocytes 1 %   Abs Immature Granulocytes 0.04 0.00 - 0.07 K/uL    Comment: Performed at [facility], [place] [address] [place],  [address]    Imaging / Studies: CT HEAD WO CONTRAST  Result Date: 07/21/2020 CLINICAL DATA:  Stroke suspected. Patient with acute onset of cognitive change evaluate for stroke. Fall last night, headache. EXAM: CT HEAD WITHOUT CONTRAST TECHNIQUE: Contiguous axial images were obtained from the base of the skull through the vertex without intravenous contrast. COMPARISON:  Prior head CT examinations 07/08/2020 and earlier. Brain MRI 10/06/2011. FINDINGS: Brain: Moderate generalized cerebral atrophy. Comparatively mild cerebellar atrophy. Mild ill-defined hypoattenuation within the cerebral white matter is nonspecific, but compatible with chronic small vessel ischemic disease. There is no acute intracranial hemorrhage. No demarcated cortical infarct. No extra-axial fluid collection. No evidence of intracranial mass. No midline shift. Vascular: No hyperdense vessel.  Atherosclerotic calcifications Skull: Normal. Negative for fracture or focal lesion. Sinuses/Orbits: Visualized orbits show no acute finding. No significant paranasal sinus disease at the imaged levels.  IMPRESSION: No evidence of acute intracranial abnormality. Moderate cerebral atrophy and mild chronic small vessel ischemic disease, stable as compared to the head CT of 07/08/2020. Electronically Signed   By: Kellie Simmering DO   On: 07/21/2020 09:23    Medications / Allergies: per chart  Antibiotics: Anti-infectives (From admission, onward)   Start     Dose/Rate Route Frequency Ordered Stop   07/18/20 0600  clindamycin (CLEOCIN) IVPB 900 mg       "And" Linked Group  Details   900 mg 100 mL/hr over 30 Minutes Intravenous On call to O.R. 07/17/20 0945 07/18/20 0918   07/18/20 0600  gentamicin (GARAMYCIN) 270 mg in dextrose 5 % 100 mL IVPB       "And" Linked Group Details   5 mg/kg  54.9 kg 213.5 mL/hr over 30 Minutes Intravenous On call to O.R. 07/17/20 0945 07/18/20 0935        Note: Portions of this report may have been transcribed using voice recognition software. Every effort was made to ensure accuracy; however, inadvertent computerized transcription errors may be present.   Any transcriptional errors that result from this process are unintentional.    Adin Hector, MD, FACS, MASCRS Gastrointestinal and Minimally Invasive Surgery  [facility] [address] [place], [place] [address] [phone] Fax [phone] Main/Paging  CONTACT INFORMATION: Weekday (9AM-5PM) concerns: Call [facility] main office at [phone] Weeknight (5PM-9AM) or Weekend/Holiday concerns: Check [email or web address] for General Surgery [facility] coverage (Please, do not use SecureChat as it is not reliable communication to operating surgeons for immediate patient care)      07/23/2020  7:42 AM

## 2020-07-23 NOTE — TOC Progression Note (Signed)
Transition of Care [facility]) - Progression Note    Patient Details  Name: [name] MRN: [id number] Date of Birth: 11-16-1940  Transition of Care [facility]) CM/SW [place], LCSW Phone Number:  07/23/2020, 1:31 PM  Clinical Narrative:    [place]. Pending Ref# [id number] Patient alert and oriented x4, pt. Friend at bedside. CSW provided bed offers to the patient and friend Silva Bandy. Patient and her friend chose [facility] [place]. CSW provided SNF choice to [place].  Patient vaccinated. Patient will need a covid test prior to discharge.    Expected Discharge Plan: [place] Barriers to Discharge: Continued Medical Work up,Insurance Authorization  Expected Discharge Plan and Services Expected Discharge Plan: [facility] In-house Referral: Clinical Social Work   Post Acute Care Choice: [place] Living arrangements for the past 2 months: Single Family Home Expected Discharge Date: 07/19/20                                     Social Determinants of Health (SDOH) Interventions    Readmission Risk Interventions No flowsheet data found.

## 2020-07-24 ENCOUNTER — Encounter (HOSPITAL_COMMUNITY): Payer: Self-pay | Admitting: Surgery

## 2020-07-24 DIAGNOSIS — R0902 Hypoxemia: Secondary | ICD-10-CM | POA: Diagnosis not present

## 2020-07-24 DIAGNOSIS — Z7401 Bed confinement status: Secondary | ICD-10-CM | POA: Diagnosis not present

## 2020-07-24 DIAGNOSIS — Z08 Encounter for follow-up examination after completed treatment for malignant neoplasm: Secondary | ICD-10-CM | POA: Diagnosis not present

## 2020-07-24 DIAGNOSIS — J9610 Chronic respiratory failure, unspecified whether with hypoxia or hypercapnia: Secondary | ICD-10-CM | POA: Diagnosis not present

## 2020-07-24 DIAGNOSIS — R488 Other symbolic dysfunctions: Secondary | ICD-10-CM | POA: Diagnosis not present

## 2020-07-24 DIAGNOSIS — J439 Emphysema, unspecified: Secondary | ICD-10-CM | POA: Diagnosis not present

## 2020-07-24 DIAGNOSIS — E876 Hypokalemia: Secondary | ICD-10-CM | POA: Diagnosis not present

## 2020-07-24 DIAGNOSIS — R531 Weakness: Secondary | ICD-10-CM | POA: Diagnosis not present

## 2020-07-24 DIAGNOSIS — R6889 Other general symptoms and signs: Secondary | ICD-10-CM | POA: Diagnosis not present

## 2020-07-24 DIAGNOSIS — R2689 Other abnormalities of gait and mobility: Secondary | ICD-10-CM | POA: Diagnosis not present

## 2020-07-24 DIAGNOSIS — N1832 Chronic kidney disease, stage 3b: Secondary | ICD-10-CM | POA: Diagnosis not present

## 2020-07-24 DIAGNOSIS — I700 Atherosclerosis of aorta: Secondary | ICD-10-CM | POA: Diagnosis not present

## 2020-07-24 DIAGNOSIS — Z743 Need for continuous supervision: Secondary | ICD-10-CM | POA: Diagnosis not present

## 2020-07-24 DIAGNOSIS — J449 Chronic obstructive pulmonary disease, unspecified: Secondary | ICD-10-CM | POA: Diagnosis not present

## 2020-07-24 DIAGNOSIS — J9611 Chronic respiratory failure with hypoxia: Secondary | ICD-10-CM | POA: Diagnosis not present

## 2020-07-24 DIAGNOSIS — H34832 Tributary (branch) retinal vein occlusion, left eye, with macular edema: Secondary | ICD-10-CM | POA: Diagnosis not present

## 2020-07-24 DIAGNOSIS — C3490 Malignant neoplasm of unspecified part of unspecified bronchus or lung: Secondary | ICD-10-CM | POA: Diagnosis not present

## 2020-07-24 DIAGNOSIS — H43812 Vitreous degeneration, left eye: Secondary | ICD-10-CM | POA: Diagnosis not present

## 2020-07-24 DIAGNOSIS — I270 Primary pulmonary hypertension: Secondary | ICD-10-CM | POA: Diagnosis not present

## 2020-07-24 DIAGNOSIS — M2550 Pain in unspecified joint: Secondary | ICD-10-CM | POA: Diagnosis not present

## 2020-07-24 DIAGNOSIS — I10 Essential (primary) hypertension: Secondary | ICD-10-CM | POA: Diagnosis not present

## 2020-07-24 DIAGNOSIS — Z85118 Personal history of other malignant neoplasm of bronchus and lung: Secondary | ICD-10-CM | POA: Diagnosis not present

## 2020-07-24 DIAGNOSIS — I2510 Atherosclerotic heart disease of native coronary artery without angina pectoris: Secondary | ICD-10-CM | POA: Diagnosis not present

## 2020-07-24 DIAGNOSIS — Z8719 Personal history of other diseases of the digestive system: Secondary | ICD-10-CM | POA: Diagnosis not present

## 2020-07-24 DIAGNOSIS — R410 Disorientation, unspecified: Secondary | ICD-10-CM | POA: Diagnosis not present

## 2020-07-24 DIAGNOSIS — F32A Depression, unspecified: Secondary | ICD-10-CM | POA: Diagnosis not present

## 2020-07-24 DIAGNOSIS — R2681 Unsteadiness on feet: Secondary | ICD-10-CM | POA: Diagnosis not present

## 2020-07-24 DIAGNOSIS — E785 Hyperlipidemia, unspecified: Secondary | ICD-10-CM | POA: Diagnosis not present

## 2020-07-24 DIAGNOSIS — Z9981 Dependence on supplemental oxygen: Secondary | ICD-10-CM | POA: Diagnosis not present

## 2020-07-24 DIAGNOSIS — M6281 Muscle weakness (generalized): Secondary | ICD-10-CM | POA: Diagnosis not present

## 2020-07-24 DIAGNOSIS — E1122 Type 2 diabetes mellitus with diabetic chronic kidney disease: Secondary | ICD-10-CM | POA: Diagnosis not present

## 2020-07-24 DIAGNOSIS — R296 Repeated falls: Secondary | ICD-10-CM | POA: Diagnosis not present

## 2020-07-24 DIAGNOSIS — Z9889 Other specified postprocedural states: Secondary | ICD-10-CM | POA: Diagnosis not present

## 2020-07-24 DIAGNOSIS — R278 Other lack of coordination: Secondary | ICD-10-CM | POA: Diagnosis not present

## 2020-07-24 LAB — PHOSPHORUS: Phosphorus: 3.6 mg/dL (ref 2.5–4.6)

## 2020-07-24 LAB — CBC WITH DIFFERENTIAL/PLATELET
Abs Immature Granulocytes: 0.03 10*3/uL (ref 0.00–0.07)
Basophils Absolute: 0.1 10*3/uL (ref 0.0–0.1)
Basophils Relative: 1 %
Eosinophils Absolute: 0.4 10*3/uL (ref 0.0–0.5)
Eosinophils Relative: 6 %
HCT: 29.4 % — ABNORMAL LOW (ref 36.0–46.0)
Hemoglobin: 9.9 g/dL — ABNORMAL LOW (ref 12.0–15.0)
Immature Granulocytes: 1 %
Lymphocytes Relative: 11 %
Lymphs Abs: 0.7 10*3/uL (ref 0.7–4.0)
MCH: 33.4 pg (ref 26.0–34.0)
MCHC: 33.7 g/dL (ref 30.0–36.0)
MCV: 99.3 fL (ref 80.0–100.0)
Monocytes Absolute: 0.6 10*3/uL (ref 0.1–1.0)
Monocytes Relative: 9 %
Neutro Abs: 4.6 10*3/uL (ref 1.7–7.7)
Neutrophils Relative %: 72 %
Platelets: 173 10*3/uL (ref 150–400)
RBC: 2.96 MIL/uL — ABNORMAL LOW (ref 3.87–5.11)
RDW: 12.2 % (ref 11.5–15.5)
WBC: 6.3 10*3/uL (ref 4.0–10.5)
nRBC: 0 % (ref 0.0–0.2)

## 2020-07-24 LAB — COMPREHENSIVE METABOLIC PANEL
ALT: 31 U/L (ref 0–44)
AST: 22 U/L (ref 15–41)
Albumin: 3.2 g/dL — ABNORMAL LOW (ref 3.5–5.0)
Alkaline Phosphatase: 41 U/L (ref 38–126)
Anion gap: 8 (ref 5–15)
BUN: 12 mg/dL (ref 8–23)
CO2: 31 mmol/L (ref 22–32)
Calcium: 8.4 mg/dL — ABNORMAL LOW (ref 8.9–10.3)
Chloride: 99 mmol/L (ref 98–111)
Creatinine, Ser: 0.69 mg/dL (ref 0.44–1.00)
GFR, Estimated: >60 mL/min (ref >60)
Glucose, Bld: 106 mg/dL — ABNORMAL HIGH (ref 70–99)
Potassium: 3.6 mmol/L (ref 3.5–5.1)
Sodium: 138 mmol/L (ref 135–145)
Total Bilirubin: 1.3 mg/dL — ABNORMAL HIGH (ref 0.3–1.2)
Total Protein: 5.6 g/dL — ABNORMAL LOW (ref 6.5–8.1)

## 2020-07-24 LAB — SARS CORONAVIRUS 2 BY RT PCR (HOSPITAL ORDER, PERFORMED IN ~~LOC~~ HOSPITAL LAB): SARS Coronavirus 2: NEGATIVE

## 2020-07-24 LAB — MAGNESIUM: Magnesium: 2.1 mg/dL (ref 1.7–2.4)

## 2020-07-24 NOTE — Discharge Summary (Signed)
Physician Discharge Summary  [name] JJH:[id number] DOB: [birth year] DOA: 07/18/2020  PCP: Caren Macadam, MD  Admit date: 07/18/2020 Discharge date: 07/24/2020  Admitted From:  Home.  Disposition:  SNF( [facility] )  Recommendations for Outpatient Follow-up:  1. Follow up with PCP in 1-2 weeks. 2. Please obtain BMP/CBC in one week. 3. Advised to follow-up with Dr. Johney Maine in 1 week.   4. Advised to follow-up with Dr. [place] Caller pulmonologist in 1 week for follow-up.   5. Advised to continue using oxygen.  Advised to follow-up with PCP   Home Health: None. Equipment/Devices: Oxygen @ 2L/M  Discharge Condition: Good CODE STATUS:Full code Diet recommendation: Heart Healthy  Brief Summary / Hospital Course: This 79 yrs old female with PMH of COPD, stage I lung CA status post lobectomy, chronic respiratory failure on home oxygen but noncompliant with its use, hypertension, hyperlipidemia, depression who was admitted under general surgical service for Elective hernia repair and has developed wheezing and hypoxia on postoperative day 1.  Medical team was consulted for co-management.  Patient was placed on 2 L supplemental oxygen.  She has developed altered mental status secondary to hypoxia which improved with continued use of oxygen.  CT head was unremarkable for any acute abnormalities.  TSH, ammonia, vitamin B12 all were within normal limits.  Patient was continued on oxygen and  inhalers.  She is more alert,  awake and oriented.  Altered mental status was most likely sundowning and due to hypoxia for not using oxygen.  Patient is cleared from general surgery to be discharged and follow-up outpatient.  Patient will follow up with pulmonology Dr. Chase Caller.  Patient is being discharged to a skilled nursing facility for rehabilitation.  She was managed for below problems.   Discharge Diagnoses:  Principal Problem:   Acute delirium Active Problems:   Hypertension   Moderate  COPD (chronic obstructive pulmonary disease), Sees Dr. 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 Most likely sec. to acute on chronic hypoxic respiratory failure.  Patient had removed her O2, She became confused, trying to get out of the bed, not making sense to the ED staff. Patient placed back on 2 L O2 nasal cannula and encephalopathy resolved. Counseled patient at length that given her significant lung disease,  she had ZERO reserve,  She should not remove her oxygen during the day or at night. Patient will require O2 24 hours a day. CT head: unremarkable. R/O CVA Blood cultures no growth so far. Decreased Haldol 1 mgBIDsecondary to patient's somnolence Robaxin 500 mgBID PRN(held) secondary to patient's somnolence Tramadol 50 mgq 12 hr PRN(held)secondary to patient's tolerance  Encourage out of bed to chair q shift  Keep during the daytime all lights on, and shades open.  Acute on chronic respiratory failure with hypoxia /COPD Gold II Suspect postoperative atelectasis based on CXR finding in absence of fever, new cough, etc.  Fluid overload may have contributed which was treated with lasix .  Underlying COPD seems to have made it easier to have respiratory failure, and may also be slightly exacerbated.  Encourage use of incentive spirometry, instructed on its use.  Pt has close friends at bedside who will continue to enforce this strong recommendation.  OOB as much as possible. Continue  duonebs scheduled,  albuterol prn Continue maintenance bronchodilators Avoid steroids postoperatively unless wheezing recurs despite inhaled Tx.  Supplement oxygen 24 hours a day to minimize long term pulmonary HTN, and episodes of acute encephalopathy.  Check ambulatory SPO2. Follow up  with pulmonary after DC, Dr. [place] Caller Maintain VTE ppx If fever develops, strongly consider repeat CXR and coverage for aspiration PNA.  -Incentive spirometry -Flutter valve -DuoNeb QID  History of stage I lung CA -S/p lobectomy 2013 by Dr. Servando Snare and followed by Dr. Julien Nordmann under surveillance with no known evidence of recurrence.   Stage IIIb CKD:  - Monitor Cr and electrolytes in AM following lasix. -Renal functions back to baseline.   S/p Laparoscopic bilateral spigelian hernias and umbilical hernia repair with mesh 12/10/2021by Dr. Johney Maine:  -Follow-up in 2 weeks. - Minimize narcotics as able.  Hypokalemia -Potassium goal> 4  Goals of care -12/13 PT/OT consult;Evaluate for CIR vs SNF  Discharge Instructions  Discharge Instructions    Call MD for:   Complete by: As directed    FEVER >101.5 F (Temperatures <101.10F occasionally happen and are not significant)   Call MD for:  difficulty breathing, headache or visual disturbances   Complete by: As directed    Call MD for:  extreme fatigue   Complete by: As directed    Call MD for:  persistant dizziness or light-headedness   Complete by: As directed    Call MD for:  persistant dizziness or light-headedness   Complete by: As directed    Call MD for:  persistant nausea and vomiting   Complete by: As directed    Call MD for:  persistant nausea and vomiting   Complete by: As directed    Call MD for:  redness, tenderness, or signs of infection (pain, swelling, redness, odor or green/yellow discharge around incision site)   Complete by: As directed    Call MD for:  severe uncontrolled pain   Complete by: As directed    Call MD for:  temperature >100.4   Complete by: As directed    Diet - low sodium heart healthy   Complete by: As directed    Follow a light diet the first few days at home.  Start with a bland diet such as soups, liquids, starchy foods, low fat foods, etc.   If you feel full, bloated, or  constipated, stay on a full liquid or pureed/blenderized diet for a few days until you feel better and no longer constipated. Gradually get back to a regular solid diet.  Avoid fast food or heavy meals the first week as you are more likely to get nauseated.   Diet - low sodium heart healthy   Complete by: As directed    Diet - low sodium heart healthy   Complete by: As directed    Diet Carb Modified   Complete by: As directed    Discharge instructions   Complete by: As directed    One the day of your discharge from the hospital (or the next business weekday), please call [facility] to set up or confirm an appointment to see your surgeon in the office for a follow-up appointment.  Usually it is 2-3 weeks after your surgery.  Other concerns If you are not getting better after two weeks or are noticing you are getting worse, contact our office [phone] for further advice.  We may need to adjust your medications, re-evaluate you in the office, send you to the emergency room, or see what other things  we can do to help. The clinic staff is available to answer your questions during regular business hours (8:30am-5pm).  Please don't hesitate to call and ask to speak to one of our nurses for clinical concerns.    A surgeon from [facility] is always on call at the hospitals 24 hours/day If you have a medical emergency, go to the nearest emergency room or call 911.   Discharge instructions   Complete by: As directed    Advised to follow-up with Dr. Elinor Parkinson in 1 week.   Advised to follow-up with Dr. Chase Caller pulmonologist in 1 week for follow-up.   Advised to continue using oxygen.  Advised to follow-up with PCP in 1 week.   Discharge wound care:   Complete by: As directed    Follow-up with general surgery Dr. Johney Maine in 2 weeks.   Driving Restrictions   Complete by: As directed    You may drive when you are no longer taking prescription pain medication, you can  comfortably wear a seatbelt, and you can safely maneuver your car and apply brakes.   Increase activity slowly   Complete by: As directed    Increase activity slowly   Complete by: As directed    Increase activity slowly   Complete by: As directed    Lifting restrictions   Complete by: As directed    You may resume regular (light) daily activities beginning the next day-such as daily self-care, walking, climbing stairs-gradually increasing activities as tolerated.   If you can walk 30 minutes without difficulty, it is safe to try more intense activity such as jogging, treadmill, bicycling, low-impact aerobics, swimming, etc. Save the most intensive and strenuous activity for last such as sit-ups, heavy lifting, contact sports, etc   Refrain from any heavy lifting or straining until you are off narcotics for pain control.   DO NOT PUSH THROUGH PAIN.   Let pain be your guide: If it hurts to do something, don't do it.   Pain is your body warning you to avoid that activity for another week until the pain goes down.   May shower / Bathe   Complete by: As directed    Wash / shower every day.  You may shower over the dressings as they are waterproof.  Continue to shower over incision(s) after the dressing is off.   May walk up steps   Complete by: As directed    Remove dressing in 72 hours   Complete by: As directed    Remove your waterproof dressings, skin tapes, and other bandages 3 days after surgery.    You may leave the incision(s) open to air.   You may replace a dressing/Band-Aid to cover the incision for comfort if you wish.  It is good for closed incisions and even open wounds to be washed every day.  Shower every day.  Short baths are fine.  Wash the incisions and wounds clean with soap & water.    You may leave closed incisions open to air if it is dry.   You may cover the incision with clean gauze & replace it after your daily shower for comfort.  TEGADERM:  You have clear gauze  band-aid dressings over your closed incision(s).  Remove the dressings 3 days after surgery.   Sexual Activity Restrictions   Complete by: As directed    You may have sexual intercourse when it is comfortable. If it hurts to do something, stop.     Allergies as  of 07/24/2020      Reactions   Clindamycin/lincomycin Nausea And Vomiting   Penicillins Rash      Spiriva [tiotropium Bromide Monohydrate]    General intolerance   Sulfa Antibiotics Hives, Rash   Severe Hives with skin reaction      Medication List    TAKE these medications   acetaminophen 325 MG tablet Commonly known as: TYLENOL Take 325 mg by mouth every 6 (six) hours as needed for moderate pain. Alternates with Ibuprofen   amLODipine 5 MG tablet Commonly known as: NORVASC TAKE 1 TABLET BY MOUTH AT BEDTIME What changed: when to take this   Breztri Aerosphere 160-9-4.8 MCG/ACT Aero Generic drug: Budeson-Glycopyrrol-Formoterol Inhale 2 puffs into the lungs 2 (two) times daily.   cholecalciferol 25 MCG (1000 UNIT) tablet Commonly known as: VITAMIN D3 Take 1,000 Units by mouth daily.   FLUoxetine 20 MG capsule Commonly known as: PROZAC Take 1 capsule by mouth twice daily   ProAir HFA 108 (90 Base) MCG/ACT inhaler Generic drug: albuterol INHALE 2 PUFFS BY MOUTH EVERY 6 HOURS AS NEEDED FOR WHEEZING OR SHORTNESS OF BREATH What changed:   how much to take  how to take this  when to take this  reasons to take this   rosuvastatin 5 MG tablet Commonly known as: CRESTOR Take 1 tablet by mouth once daily   vitamin B-12 1000 MCG tablet Commonly known as: CYANOCOBALAMIN Take 1,000 mcg by mouth daily.   vitamin E 180 MG (400 UNITS) capsule Take 400 Units by mouth daily.            Durable Medical Equipment  (From admission, onward)         Start     Ordered   07/24/20 1029  DME Oxygen  Once       Question Answer Comment  Length of Need Lifetime   Mode or (Route) Nasal cannula   Liters per  Minute 2   Frequency Continuous (stationary and portable oxygen unit needed)   Oxygen conserving device Yes   Oxygen delivery system Gas      07/24/20 1029           Discharge Care Instructions  (From admission, onward)         Start     Ordered   07/24/20 0000  Discharge wound care:       Comments: Follow-up with general surgery Dr. Johney Maine in 2 weeks.   07/24/20 1029          Follow-up Information    Michael Boston, MD. Schedule an appointment as soon as possible for a visit in 3 weeks.   Specialty: General Surgery Why: To follow up after your operation Contact information: [address] [place] [address] [phone]        Caren Macadam, MD Follow up in 1 week(s).   Specialty: Family Medicine Contact information: [place] [address] [phone]        Brand Males, MD Follow up in 1 week(s).   Specialty: Pulmonary Disease Contact information: [address] [place] [address] [phone]              Allergies  Allergen Reactions   Clindamycin/Lincomycin Nausea And Vomiting   Penicillins Rash        Spiriva [Tiotropium Bromide Monohydrate]     General intolerance   Sulfa Antibiotics Hives and Rash    Severe Hives with skin reaction  Consultations:  Pulmonology  General surgery   Procedures/Studies: CT HEAD WO CONTRAST  Result Date: 07/21/2020 CLINICAL DATA:  Stroke suspected. Patient with acute onset of cognitive change evaluate for stroke. Fall last night, headache. EXAM: CT HEAD WITHOUT CONTRAST TECHNIQUE: Contiguous axial images were obtained from the base of the skull through the vertex without intravenous contrast. COMPARISON:  Prior head CT examinations 07/08/2020 and earlier. Brain MRI 10/06/2011. FINDINGS: Brain: Moderate generalized cerebral atrophy. Comparatively mild cerebellar atrophy. Mild ill-defined hypoattenuation within the cerebral white matter  is nonspecific, but compatible with chronic small vessel ischemic disease. There is no acute intracranial hemorrhage. No demarcated cortical infarct. No extra-axial fluid collection. No evidence of intracranial mass. No midline shift. Vascular: No hyperdense vessel.  Atherosclerotic calcifications Skull: Normal. Negative for fracture or focal lesion. Sinuses/Orbits: Visualized orbits show no acute finding. No significant paranasal sinus disease at the imaged levels. IMPRESSION: No evidence of acute intracranial abnormality. Moderate cerebral atrophy and mild chronic small vessel ischemic disease, stable as compared to the head CT of 07/08/2020. Electronically Signed   By: Kellie Simmering DO   On: 07/21/2020 09:23   CT Head Wo Contrast  Result Date: 07/08/2020 CLINICAL DATA:  Laceration.  Blunt trauma EXAM: CT HEAD WITHOUT CONTRAST TECHNIQUE: Contiguous axial images were obtained from the base of the skull through the vertex without intravenous contrast. COMPARISON:  Head CT 06/19/2019 FINDINGS: Brain: No acute intracranial hemorrhage. No focal mass lesion. No CT evidence of acute infarction. No midline shift or mass effect. No hydrocephalus. Basilar cisterns are patent. There are periventricular and subcortical white matter hypodensities. Generalized cortical atrophy. Vascular: No hyperdense vessel or unexpected calcification. Skull: Normal. Negative for fracture or focal lesion. Sinuses/Orbits: Paranasal sinuses and mastoid air cells are clear. Orbits are clear. Other: None. IMPRESSION: 1. No acute intracranial findings.  No evidence trauma. 2. Atrophy and white matter microvascular disease. Electronically Signed   By: Suzy Bouchard M.D.   On: 07/08/2020 12:06   DG CHEST PORT 1 VIEW  Result Date: 07/20/2020 CLINICAL DATA:  Post hernia repair.  Decreased oxygen saturations EXAM: PORTABLE CHEST 1 VIEW COMPARISON:  07/19/2020 FINDINGS: Normal cardiac silhouette. Emphysema the upper lobes. Chronic calcified  nodule in the RIGHT upper lobe. No pneumothorax. No infiltrate. No pulmonary edema IMPRESSION: No acute cardiopulmonary process.  Emphysema Electronically Signed   By: Suzy Bouchard M.D.   On: 07/20/2020 09:23   DG CHEST PORT 1 VIEW  Result Date: 07/19/2020 CLINICAL DATA:  Wheezing. EXAM: PORTABLE CHEST 1 VIEW COMPARISON:  April 21, 2015. FINDINGS: The heart size and mediastinal contours are within normal limits. No pneumothorax or pleural effusion is noted. Left lung is clear. Stable postoperative changes seen laterally in the right midlung. New right basilar opacity is noted concerning for subsegmental atelectasis or possibly pneumonia. The visualized skeletal structures are unremarkable. IMPRESSION: New right basilar opacity is noted concerning for subsegmental atelectasis or possibly pneumonia. Electronically Signed   By: Marijo Conception M.D.   On: 07/19/2020 15:54    Laparoscopic hernia repair.   Subjective: Patient was seen and examined at bedside.  No overnight events.  Patient has been using oxygen consistently.  She is more alert and awake, oriented.  She feels better and wants to be discharged today.  Discharge Exam: Vitals:   07/24/20 0651 07/24/20 0923  BP: 133/83   Pulse: 80   Resp: 18   Temp: 98.4 F (36.9 C)   SpO2: 96% 98%   Vitals:   07/23/20 2026  07/23/20 2057 07/24/20 0651 07/24/20 0923  BP: 115/81  133/83   Pulse: 96  80   Resp: 18  18   Temp: 98.1 F (36.7 C)  98.4 F (36.9 C)   TempSrc: Oral  Oral   SpO2: 95% 94% 96% 98%  Weight:      Height:        General: Pt is alert, awake, not in acute distress Cardiovascular: RRR, S1/S2 +, no rubs, no gallops Respiratory: CTA bilaterally, no wheezing, no rhonchi Abdominal: Soft, NT, ND, bowel sounds + Extremities: no edema, no cyanosis    The results of significant diagnostics from this hospitalization (including imaging, microbiology, ancillary and laboratory) are listed below for reference.      Microbiology: Recent Results (from the past 240 hour(s))  SARS CORONAVIRUS 2 (TAT 6-24 HRS) Nasopharyngeal Nasopharyngeal Swab     Status: None   Collection Time: 07/15/20 10:43 AM   Specimen: Nasopharyngeal Swab  Result Value Ref Range Status   SARS Coronavirus 2 NEGATIVE NEGATIVE Final    Comment: (NOTE) SARS-CoV-2 target nucleic acids are NOT DETECTED.  The SARS-CoV-2 RNA is generally detectable in upper and lower respiratory specimens during the acute phase of infection. Negative results do not preclude SARS-CoV-2 infection, do not rule out co-infections with other pathogens, and should not be used as the sole basis for treatment or other patient management decisions. Negative results must be combined with clinical observations, patient history, and epidemiological information. The expected result is Negative.  Fact Sheet for Patients: SugarRoll.be  Fact Sheet for Healthcare Providers: https://www.woods-mathews.com/  This test is not yet approved or cleared by the [place] FDA and  has been authorized for detection and/or diagnosis of SARS-CoV-2 by FDA under an Emergency Use Authorization (EUA). This EUA will remain  in effect (meaning this test can be used) for the duration of the COVID-19 declaration under Se ction 564(b)(1) of the Act, 21 U.S.C. section 360bbb-3(b)(1), unless the authorization is terminated or revoked sooner.  Performed at [facility], [place] [address], [place], [place] [address]   Culture, blood (routine x 2)     Status: None (Preliminary result)   Collection Time: 07/21/20  8:18 PM   Specimen: BLOOD  Result Value Ref Range Status   Specimen Description   Final    BLOOD RIGHT ARM Performed at [facility] [address], [place], [place] [address]    Special Requests   Final    BOTTLES DRAWN AEROBIC ONLY Blood Culture adequate volume Performed at [facility] [address] [place], [place] [address]    Culture   Final    NO GROWTH 2 DAYS Performed at [facility] [address] [place], [place] [address]    Report Status PENDING  Incomplete  Culture, blood (routine x 2)     Status: None (Preliminary result)   Collection Time: 07/21/20  8:18 PM   Specimen: BLOOD  Result Value Ref Range Status   Specimen Description   Final    BLOOD LEFT ARM Performed at [facility] [address], [place], [place] [address]    Special Requests   Final    BOTTLES DRAWN AEROBIC ONLY Blood Culture adequate volume Performed at [facility] [address] [place], [place] [address]    Culture   Final    NO GROWTH 2 DAYS Performed at [facility] [address], [place], [place] [address]  Report Status PENDING  Incomplete     Labs: BNP (last 3 results) No results for input(s): BNP in the last 8760 hours. Basic Metabolic Panel: Recent Labs  Lab 07/19/20 0542 07/20/20 0510 07/21/20 0455 07/22/20 0458 07/23/20 0546 07/24/20 0504  NA 133* 132* 133* 135 138 138  K 4.5 3.8 3.4* 3.8 3.4* 3.6  CL 100 97* 95* 98 98 99  CO2 23 26 26 27 31 31   GLUCOSE 96 110* 129* 125* 105* 106*  BUN 14 10 14 16 16 12   CREATININE 1.06* 0.81 0.78 0.67 0.75 0.69  CALCIUM 8.7* 8.7* 8.6* 8.9 8.7* 8.4*  MG 2.1  --  1.9 2.0 2.3 2.1  PHOS  --  2.5 2.7 2.1* 3.4 3.6   Liver Function Tests: Recent Labs  Lab 07/20/20 0510 07/21/20 0455 07/22/20 0458 07/23/20 0546 07/24/20 0504  AST  --  48* 66* 41 22  ALT  --  31 44 39 31  ALKPHOS  --  39 39 44 41  BILITOT  --  1.7* 1.6* 1.4* 1.3*  PROT  --  5.8* 6.3* 6.1* 5.6*  ALBUMIN 3.8 3.6 4.1 3.5 3.2*   No results for input(s): LIPASE, AMYLASE in the last 168 hours. No results for input(s): AMMONIA in the last 168 hours. CBC: Recent Labs  Lab 07/19/20 0542 07/21/20 0455 07/22/20 0458 07/23/20 0546 07/24/20 0504  WBC 8.6 11.9* 10.1 8.2 6.3   NEUTROABS  --  10.9* 8.7* 6.1 4.6  HGB 12.4 11.7* 10.8* 10.8* 9.9*  HCT 37.1 34.3* 31.4* 32.4* 29.4*  MCV 98.4 96.3 96.6 98.5 99.3  PLT 156 173 170 201 173   Cardiac Enzymes: No results for input(s): CKTOTAL, CKMB, CKMBINDEX, TROPONINI in the last 168 hours. BNP: Invalid input(s): POCBNP CBG: No results for input(s): GLUCAP in the last 168 hours. D-Dimer No results for input(s): DDIMER in the last 72 hours. Hgb A1c No results for input(s): HGBA1C in the last 72 hours. Lipid Profile No results for input(s): CHOL, HDL, LDLCALC, TRIG, CHOLHDL, LDLDIRECT in the last 72 hours. Thyroid function studies No results for input(s): TSH, T4TOTAL, T3FREE, THYROIDAB in the last 72 hours.  Invalid input(s): FREET3 Anemia work up No results for input(s): VITAMINB12, FOLATE, FERRITIN, TIBC, IRON, RETICCTPCT in the last 72 hours. Urinalysis    Component Value Date/Time   COLORURINE AMBER (A) 05/27/2014 1444   APPEARANCEUR CLOUDY (A) 05/27/2014 1444   LABSPEC 1.025 05/27/2014 1444   PHURINE 5.0 05/27/2014 1444   GLUCOSEU NEGATIVE 05/27/2014 1444   HGBUR NEGATIVE 05/27/2014 1444   BILIRUBINUR NEGATIVE 05/27/2014 1444   KETONESUR NEGATIVE 05/27/2014 1444   PROTEINUR NEGATIVE 05/27/2014 1444   UROBILINOGEN 0.2 05/27/2014 1444   NITRITE NEGATIVE 05/27/2014 1444   LEUKOCYTESUR SMALL (A) 05/27/2014 1444   Sepsis Labs Invalid input(s): PROCALCITONIN,  WBC,  LACTICIDVEN Microbiology Recent Results (from the past 240 hour(s))  SARS CORONAVIRUS 2 (TAT 6-24 HRS) Nasopharyngeal Nasopharyngeal Swab     Status: None   Collection Time: 07/15/20 10:43 AM   Specimen: Nasopharyngeal Swab  Result Value Ref Range Status   SARS Coronavirus 2 NEGATIVE NEGATIVE Final    Comment: (NOTE) SARS-CoV-2 target nucleic acids are NOT DETECTED.  The SARS-CoV-2 RNA is generally detectable in upper and lower respiratory specimens during the acute phase of infection. Negative results do not preclude SARS-CoV-2  infection, do not rule out co-infections with other pathogens, and should not be used as the sole basis for treatment or other patient management decisions. Negative results must  be combined with clinical observations, patient history, and epidemiological information. The expected result is Negative.  Fact Sheet for Patients: SugarRoll.be  Fact Sheet for Healthcare Providers: https://www.woods-mathews.com/  This test is not yet approved or cleared by the [place] FDA and  has been authorized for detection and/or diagnosis of SARS-CoV-2 by FDA under an Emergency Use Authorization (EUA). This EUA will remain  in effect (meaning this test can be used) for the duration of the COVID-19 declaration under Se ction 564(b)(1) of the Act, 21 U.S.C. section 360bbb-3(b)(1), unless the authorization is terminated or revoked sooner.  Performed at [facility], [place] [address] [place], [place] [address]   Culture, blood (routine x 2)     Status: None (Preliminary result)   Collection Time: 07/21/20  8:18 PM   Specimen: BLOOD  Result Value Ref Range Status   Specimen Description   Final    BLOOD RIGHT ARM Performed at [facility] [address], [place], [place] [address]    Special Requests   Final    BOTTLES DRAWN AEROBIC ONLY Blood Culture adequate volume Performed at [facility] [address] [place], [place] [address]    Culture   Final    NO GROWTH 2 DAYS Performed at [facility] [address], [place], [place] [address]    Report Status PENDING  Incomplete  Culture, blood (routine x 2)     Status: None (Preliminary result)   Collection Time: 07/21/20  8:18 PM   Specimen: BLOOD  Result Value Ref Range Status   Specimen Description   Final    BLOOD LEFT ARM Performed at [facility] [address] [place], [place] [address]    Special Requests   Final     BOTTLES DRAWN AEROBIC ONLY Blood Culture adequate volume Performed at [facility] [address] [place], [place] [address]    Culture   Final    NO GROWTH 2 DAYS Performed at [facility] [address], [place], [place] [address]    Report Status PENDING  Incomplete     Time coordinating discharge: Over 30 minutes  SIGNED:   Shawna Clamp, MD  [facility] 07/24/2020, 10:30 AM Pager   If 7PM-7AM, please contact night-coverage [email or web address]

## 2020-07-24 NOTE — Progress Notes (Signed)
Patient is dressed to be discharge. Writer attempted to call [facility] for report, no answer. Will try again.

## 2020-07-24 NOTE — Progress Notes (Signed)
Physical Therapy Treatment Patient Details Name: [name] MRN: [id number] DOB: 07-23-1941 Today's Date: 07/24/2020    History of Present Illness 79 yo female s/p hernia repair with mesh 07/18/20. Pt also sustained a fall 12/12 in hospital. Hx of COPD, lung ca s/p lobectomy, osteopenia, depression, R TKA 2014    PT Comments    Pt in bed on 2 lts sats 96%.  Friend at bedside.  Assisted OOB to amb.  General bed mobility comments: assist for lower body due to recent ABD surgery.  General transfer comment: Assist to rise, stabilize, control descent. Multimodal cueing required.  Pt descended back to EOB without warning, requiring Min As for safety. General Gait Details: Assist to stabilize pt and manage with RW. Cues for safety. Distance limited by fatigue. Dyspnea 2/4 avg RA 92%   Assisted back to bed and reapplied O2.  Pt plans are  Follow Up Recommendations  SNF     Equipment Recommendations  None recommended by PT    Recommendations for Other Services       Precautions / Restrictions Precautions Precautions: Fall Precaution Comments: abd sg Restrictions Weight Bearing Restrictions: No    Mobility  Bed Mobility Overal bed mobility: Needs Assistance Bed Mobility: Supine to Sit;Sit to Supine     Supine to sit: Mod assist Sit to supine: Mod assist   General bed mobility comments: assist for lower body due to recent ABD surgery  Transfers Overall transfer level: Needs assistance Equipment used: Rolling walker (2 wheeled) Transfers: Sit to/from [facility] Sit to Stand: Min assist Stand pivot transfers: Min assist       General transfer comment: Assist to rise, stabilize, control descent. Multimodal cueing required.  Pt descended back to EOB without warning, requiring Min As for safety.  Ambulation/Gait Ambulation/Gait assistance: Min assist Gait Distance (Feet): 55 Feet Assistive device: Rolling walker (2 wheeled) Gait Pattern/deviations:  Step-through pattern;Decreased stride length Gait velocity: decreased   General Gait Details: Assist to stabilize pt and manage with RW. Cues for safety. Distance limited by fatigue. Dyspnea 2/4 avg RA 92%   Stairs             Wheelchair Mobility    Modified Rankin (Stroke Patients Only)       Balance                                            Cognition Arousal/Alertness: Awake/alert Behavior During Therapy: WFL for tasks assessed/performed Overall Cognitive Status: Within Functional Limits for tasks assessed                                 General Comments: AxO x 3 pleasant      Exercises      General Comments        Pertinent Vitals/Pain Pain Assessment: Faces Faces Pain Scale: Hurts a little bit Pain Location: ABD with activity Pain Descriptors / Indicators: Aching;Discomfort;Grimacing Pain Intervention(s): Monitored during session;Premedicated before session;Repositioned    Home Living                      Prior Function            PT Goals (current goals can now be found in the care plan section) Progress towards PT goals: Progressing toward goals  Frequency    Min 2X/week      PT Plan Current plan remains appropriate    Co-evaluation              AM-PAC PT "6 Clicks" Mobility   Outcome Measure  Help needed turning from your back to your side while in a flat bed without using bedrails?: A Little Help needed moving from lying on your back to sitting on the side of a flat bed without using bedrails?: A Little Help needed moving to and from a bed to a chair (including a wheelchair)?: A Little Help needed standing up from a chair using your arms (e.g., wheelchair or bedside chair)?: A Little Help needed to walk in hospital room?: A Little Help needed climbing 3-5 steps with a railing? : A Lot 6 Click Score: 17    End of Session Equipment Utilized During Treatment: Gait belt Activity  Tolerance: Patient limited by fatigue Patient left: in bed;with call bell/phone within reach;with bed alarm set;with restraints reapplied;with family/visitor present Nurse Communication: Mobility status PT Visit Diagnosis: History of falling (Z91.81);Muscle weakness (generalized) (M62.81);Unsteadiness on feet (R26.81)     Time: 3734-2876 PT Time Calculation (min) (ACUTE ONLY): 24 min  Charges:  $Gait Training: 8-22 mins $Therapeutic Activity: 8-22 mins                     Rica Koyanagi  PTA Acute  Rehabilitation Services Pager      [phone] Office      [phone]

## 2020-07-24 NOTE — TOC Transition Note (Signed)
Transition of Care [facility]) - CM/SW Discharge Note   Patient Details  Name: [name] MRN: [id number] Date of Birth: July 10, 1941  Transition of Care [facility]) CM/SW Contact:  Lia Hopping, LCSW Phone Number: 07/24/2020, 12:38 PM   Clinical Narrative:    Patient approved [employer] navi approved. [id number] approved for 5 days, start date 12/16-12/20 [facility] ready to accept.  Nurse call report to: [phone] [facility] arranged to transport the patient ETA 1.5   Final next level of care: Skilled Nursing Facility Barriers to Discharge: Barriers Resolved   Patient Goals and CMS Choice Patient states their goals for this hospitalization and ongoing recovery are:: Per Sheliah Hatch: to go to rehab before returning home CMS Medicare.gov Compare Post Acute Care list provided to:: Patient Represenative (must comment) Choice offered to / list presented to :  Sheliah Hatch)  Discharge Placement              Patient chooses bed at:  ([facility]) Patient to be transferred to facility by: [facility] Name of family member notified: Patient to notifiy her Sheliah Hatch, her Juanda Bond at bedside Patient and family notified of of transfer: 07/24/20  Discharge Plan and Services In-house Referral: Clinical Social Work   Post Acute Care Choice: [facility]                               Social Determinants of Health (SDOH) Interventions     Readmission Risk Interventions No flowsheet data found.

## 2020-07-24 NOTE — Progress Notes (Signed)
[name] [id number] January 15, 1941  CARE TEAM:  PCP: Caren Macadam, MD  Outpatient Care Team: Patient Care Team: Caren Macadam, MD as PCP - General (Family Medicine) Brand Males, MD (Pulmonary Disease) Grace Isaac, MD (Cardiothoracic Surgery) Lorretta Harp, MD as Consulting Physician (Cardiology) Curt Bears, MD as Consulting Physician (Hematology and Oncology) Michael Boston, MD as Consulting Physician (General Surgery) Michael Boston, MD as Consulting Physician (General Surgery)  Inpatient Treatment Team: Treatment Team: Attending Provider: Shawna Clamp, MD; Consulting Physician: Patrecia Pour, MD; Rounding Team: Fatima Blank, MD; Technician: Ernest Mallick, NT; Consulting Physician: Juanito Doom, MD; Consulting Physician: Michael Boston, MD; Physical Therapy Assistant: Diego Cory, PTA; Utilization Review: Fortino Sic, RN; Registered Nurse: Guadlupe Spanish, RN   Problem List:   Principal Problem:   Acute delirium Active Problems:   Hypertension   Moderate COPD (chronic obstructive pulmonary disease), Sees Dr. 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  Left side incarcerated with preperitoneal fat and some omentum.  Obvious indirect left inguinal hernia of moderate size.  No definite recurrent right inguinal hernia.  Umbilical hernia   Assessment  Hypoxia on  oxygen - stabilizing  Mild/moderate delirium most likely from [place] - resolving  [facility] Stay = 4 days)  Plan:  Hypoxia seems to be stabilized on a few liters of oxygen.  Actually has O2 at home but doesn't like to use.   Lasix diuresis x1.  Using inhalers.  I am not against steroid use if that is ultimately needed.    Increased confusion suspicious for delirium, especially sundowning..  No major metabolic or anemic etiology.  CT of head ordered by medicine shows no major abnormality.  MS more calm today a hopeful sign.  Defer need for neurology evaluation to medicine service   Mild hypokalemia - correcting.  Try K-Phos and she has some hypophosphatemia as well  Trying to remobilize.  Bowel regimen.  Get on MiraLAX to make sure she is not too backed up/constipated.  Moderate ecchymosis in the flank and groins not surprising with the repairs intact.  Stable to resolving.  VTE prophylaxis- SCDs, etc  She is stable from a surgery standpoint.  Defer to medicine & pulmonary on when OK to d/c from hospital.  Awaiting medicine and pulmonary help on the patient to prove any improvement/stability on her sundowning/delirium and hypoxia/COPD.  I am skeptical patient can safely go home.  Hopefully with careful support and monitoring, can mobilize and get her into a more active daytime routine and break the cycle of sundowning and confusion.  Make sure were not missing any organic or hypoxic etiology for her delirium.  Disposition:  Disposition:  The patient is from: Home  Anticipate discharge to:  [facility] signed  Anticipated Date of Discharge is:  July 23, 2020  Barriers to discharge:  Pending Clinical improvement (more likely than not - stable from surgery standpoint for  d/c - awaiting pulmonary & MS improvements  Patient currently is NOT MEDICALLY STABLE for discharge from the hospital from a surgery standpoint.   30 minutes spent in review, evaluation,  examination, counseling, and coordination of care.  More than 50% of that time was spent in counseling.  07/24/2020    Subjective: (Chief complaint)  Had a much better night.  No more agitation or confusion.  Sitting up.  Calm and oriented.  Denies any nausea.  Some abdominal soreness but using Tylenol only  Objective:  Vital signs:  Vitals:   07/23/20 1337 07/23/20 2026 07/23/20 2057 07/24/20 0651  BP: 104/63 115/81  133/83  Pulse: 93 96  80  Resp: 16 18  18   Temp: 98 F (36.7 C) 98.1 F (36.7 C)  98.4 F (36.9 C)  TempSrc: Oral Oral  Oral  SpO2: 93% 95% 94% 96%  Weight:      Height:        Last BM Date: 07/23/20  Intake/Output   Yesterday:  12/15 0701 - 12/16 0700 In: 1203 [P.O.:1200; I.V.:3] Out: 0  This shift:  No intake/output data recorded.  Bowel function:  Flatus: YES  BM:  YES  Drain: (No drain)   Physical Exam:  General: Pt awake/alert in no acute distress.  Not agitated or restless.  Smiling.   eyes: PERRL, normal EOM.  Sclera clear.  No icterus.  Atraumatic abrasions or hematoma Neuro: CN II-XII intact w/o focal sensory/motor deficits. Lymph: No head/neck/groin lymphadenopathy  Psych: Completely oriented.  Pleasant.  Back to her baseline.  No evidence of any delirium or psychosis this morning.  Not agitated or restless.  HENT: Normocephalic, Mucus membranes moist.  No thrush Neck: Supple, No tracheal deviation.  No obvious thyromegaly Chest: No pain to chest wall compression.  Good respiratory excursion.  No audible wheezing -mild wheezing to auscultation. CV:  Pulses intact.  Regular rhythm.  No major extremity edema MS: Normal AROM mjr joints.  No obvious deformity  Abdomen: Soft.  Nondistended.  Mildly tender at incisions only. Less ecchymosis on flank and groins.  No evidence of peritonitis.  No incarcerated hernias.   Ext:  No deformity.  No mjr edema.  No cyanosis Skin: No petechiae / purpurea.  No major sores.  Warm and  dry    Results:   Cultures: Recent Results (from the past 720 hour(s))  SARS CORONAVIRUS 2 (TAT 6-24 HRS) Nasopharyngeal Nasopharyngeal Swab     Status: None   Collection Time: 07/15/20 10:43 AM   Specimen: Nasopharyngeal Swab  Result Value Ref Range Status   SARS Coronavirus 2 NEGATIVE NEGATIVE Final    Comment: (NOTE) SARS-CoV-2 target nucleic acids are NOT DETECTED.  The SARS-CoV-2 RNA is generally detectable in upper and lower respiratory specimens during the acute phase of infection. Negative results do not preclude SARS-CoV-2 infection, do not rule out co-infections with other pathogens, and should not be used as the sole basis for treatment or other patient management decisions. Negative results must be combined with clinical observations, patient history, and epidemiological information. The expected result is Negative.  Fact Sheet for Patients: SugarRoll.be  Fact Sheet for Healthcare Providers: https://www.woods-mathews.com/  This test is not yet approved or cleared by the [place] FDA and  has been authorized for detection and/or diagnosis of SARS-CoV-2 by FDA under an Emergency Use Authorization (EUA). This EUA will remain  in effect (meaning this test can be used) for the duration of the COVID-19 declaration under Se ction 564(b)(1) of  the Act, 21 U.S.C. section 360bbb-3(b)(1), unless the authorization is terminated or revoked sooner.  Performed at [facility], [place] [address] [place], [place] [address]   Culture, blood (routine x 2)     Status: None (Preliminary result)   Collection Time: 07/21/20  8:18 PM   Specimen: BLOOD  Result Value Ref Range Status   Specimen Description   Final    BLOOD RIGHT ARM Performed at [facility] [address], [place], [place] [address]    Special Requests   Final    BOTTLES DRAWN AEROBIC ONLY Blood Culture adequate volume Performed at [facility] [address] [place], [place] [address]    Culture   Final    NO GROWTH 2 DAYS Performed at [facility] [address], [place], [place] [address]    Report Status PENDING  Incomplete  Culture, blood (routine x 2)     Status: None (Preliminary result)   Collection Time: 07/21/20  8:18 PM   Specimen: BLOOD  Result Value Ref Range Status   Specimen Description   Final    BLOOD LEFT ARM Performed at [facility] [address] [place], [place] [address]    Special Requests   Final    BOTTLES DRAWN AEROBIC ONLY Blood Culture adequate volume Performed at [facility] [address], [place], [place] [address]    Culture   Final    NO GROWTH 2 DAYS Performed at [facility] [address], [place], [place] [address]    Report Status PENDING  Incomplete    Labs: Results for orders placed or performed during the hospital encounter of 07/18/20 (from the past 48 hour(s))  Comprehensive metabolic panel     Status: Abnormal   Collection Time: 07/23/20  5:46 AM  Result Value Ref Range   Sodium 138 135 - 145 mmol/L   Potassium 3.4 (L) 3.5 - 5.1 mmol/L   Chloride 98 98 - 111 mmol/L   CO2 31 22 - 32 mmol/L   Glucose, Bld 105 (H) 70 - 99 mg/dL    Comment: Glucose reference range applies only to samples taken after fasting for at least 8 hours.   BUN 16 8 - 23 mg/dL   Creatinine, Ser 0.75 0.44 - 1.00 mg/dL   Calcium 8.7 (L) 8.9 - 10.3 mg/dL   Total Protein 6.1 (L) 6.5 - 8.1 g/dL   Albumin 3.5 3.5 - 5.0 g/dL   AST 41 15 - 41 U/L   ALT 39 0 - 44 U/L   Alkaline Phosphatase 44 38 - 126 U/L   Total Bilirubin 1.4 (H) 0.3 - 1.2 mg/dL   GFR, Estimated >60 >60 mL/min    Comment: (NOTE) Calculated using the CKD-EPI Creatinine Equation (2021)    Anion gap 9 5 - 15    Comment: Performed at [facility], [place] [address], [place], [place] [address]  Magnesium     Status: None   Collection Time:  07/23/20  5:46 AM  Result Value Ref Range   Magnesium 2.3 1.7 - 2.4 mg/dL    Comment: Performed at [facility], [place] [address], [place],  [address]  Phosphorus     Status: None   Collection Time: 07/23/20  5:46 AM  Result Value Ref Range   Phosphorus 3.4 2.5 - 4.6 mg/dL    Comment: Performed at [facility], [place] [address]  [place], [place] [address]  CBC with Differential/Platelet     Status: Abnormal   Collection Time: 07/23/20  5:46 AM  Result Value Ref Range   WBC 8.2 4.0 - 10.5 K/uL   RBC 3.29 (L) 3.87 - 5.11 MIL/uL   Hemoglobin 10.8 (L) 12.0 - 15.0 g/dL   HCT 32.4 (L) 36.0 - 46.0 %   MCV 98.5 80.0 - 100.0 fL   MCH 32.8 26.0 - 34.0 pg   MCHC 33.3 30.0 - 36.0 g/dL   RDW 12.2 11.5 - 15.5 %   Platelets 201 150 - 400 K/uL   nRBC 0.0 0.0 - 0.2 %   Neutrophils Relative % 74 %   Neutro Abs 6.1 1.7 - 7.7 K/uL   Lymphocytes Relative 12 %   Lymphs Abs 1.0 0.7 - 4.0 K/uL   Monocytes Relative 9 %   Monocytes Absolute 0.7 0.1 - 1.0 K/uL   Eosinophils Relative 3 %   Eosinophils Absolute 0.2 0.0 - 0.5 K/uL   Basophils Relative 1 %   Basophils Absolute 0.1 0.0 - 0.1 K/uL   Immature Granulocytes 1 %   Abs Immature Granulocytes 0.04 0.00 - 0.07 K/uL    Comment: Performed at [facility], [place] [address] [place], [place] [address]  Comprehensive metabolic panel     Status: Abnormal   Collection Time: 07/24/20  5:04 AM  Result Value Ref Range   Sodium 138 135 - 145 mmol/L   Potassium 3.6 3.5 - 5.1 mmol/L   Chloride 99 98 - 111 mmol/L   CO2 31 22 - 32 mmol/L   Glucose, Bld 106 (H) 70 - 99 mg/dL    Comment: Glucose reference range applies only to samples taken after fasting for at least 8 hours.   BUN 12 8 - 23 mg/dL   Creatinine, Ser 0.69 0.44 - 1.00 mg/dL   Calcium 8.4 (L) 8.9 - 10.3 mg/dL   Total Protein 5.6 (L) 6.5 - 8.1 g/dL   Albumin 3.2 (L) 3.5 - 5.0 g/dL   AST 22 15 - 41 U/L   ALT 31 0 - 44 U/L   Alkaline  Phosphatase 41 38 - 126 U/L   Total Bilirubin 1.3 (H) 0.3 - 1.2 mg/dL   GFR, Estimated >60 >60 mL/min    Comment: (NOTE) Calculated using the CKD-EPI Creatinine Equation (2021)    Anion gap 8 5 - 15    Comment: Performed at [facility], [place] [address], [place], [place] [address]  Magnesium     Status: None   Collection Time: 07/24/20  5:04 AM  Result Value Ref Range   Magnesium 2.1 1.7 - 2.4 mg/dL    Comment: Performed at [facility], [place] [address], [place], [place] [address]  Phosphorus     Status: None   Collection Time: 07/24/20  5:04 AM  Result Value Ref Range   Phosphorus 3.6 2.5 - 4.6 mg/dL    Comment: Performed at [facility], [place] [address] [place], [place] [address]  CBC with Differential/Platelet     Status: Abnormal   Collection Time: 07/24/20  5:04 AM  Result Value Ref Range   WBC 6.3 4.0 - 10.5 K/uL   RBC 2.96 (L) 3.87 - 5.11 MIL/uL   Hemoglobin 9.9 (L) 12.0 - 15.0 g/dL   HCT 29.4 (L) 36.0 - 46.0 %   MCV 99.3 80.0 - 100.0 fL   MCH 33.4 26.0 - 34.0 pg   MCHC 33.7 30.0 -  36.0 g/dL   RDW 12.2 11.5 - 15.5 %   Platelets 173 150 - 400 K/uL   nRBC 0.0 0.0 - 0.2 %   Neutrophils Relative % 72 %   Neutro Abs 4.6 1.7 - 7.7 K/uL   Lymphocytes Relative 11 %   Lymphs Abs 0.7 0.7 - 4.0 K/uL   Monocytes Relative 9 %   Monocytes Absolute 0.6 0.1 - 1.0 K/uL   Eosinophils Relative 6 %   Eosinophils Absolute 0.4 0.0 - 0.5 K/uL   Basophils Relative 1 %   Basophils Absolute 0.1 0.0 - 0.1 K/uL   Immature Granulocytes 1 %   Abs Immature Granulocytes 0.03 0.00 - 0.07 K/uL    Comment: Performed at [facility], [place] [address] [place], [place] [address]    Imaging / Studies: No results found.  Medications / Allergies: per chart  Antibiotics: Anti-infectives (From admission, onward)   Start     Dose/Rate Route Frequency Ordered Stop   07/18/20 0600  clindamycin (CLEOCIN) IVPB 900 mg       "And"  Linked Group Details   900 mg 100 mL/hr over 30 Minutes Intravenous On call to O.R. 07/17/20 0945 07/18/20 0918   07/18/20 0600  gentamicin (GARAMYCIN) 270 mg in dextrose 5 % 100 mL IVPB       "And" Linked Group Details   5 mg/kg  54.9 kg 213.5 mL/hr over 30 Minutes Intravenous On call to O.R. 07/17/20 0945 07/18/20 0935        Note: Portions of this report may have been transcribed using voice recognition software. Every effort was made to ensure accuracy; however, inadvertent computerized transcription errors may be present.   Any transcriptional errors that result from this process are unintentional.    Adin Hector, MD, FACS, MASCRS Gastrointestinal and Minimally Invasive Surgery  [facility] [address], [place], [place] [address] [phone] Fax [phone] Main/Paging  CONTACT INFORMATION: Weekday (9AM-5PM) concerns: Call [facility] main office at [phone] Weeknight (5PM-9AM) or Weekend/Holiday concerns: Check [email or web address] for General Surgery [facility] coverage (Please, do not use SecureChat as it is not reliable communication to operating surgeons for immediate patient care)      07/24/2020  8:12 AM

## 2020-07-25 DIAGNOSIS — Z9889 Other specified postprocedural states: Secondary | ICD-10-CM | POA: Diagnosis not present

## 2020-07-25 DIAGNOSIS — Z9981 Dependence on supplemental oxygen: Secondary | ICD-10-CM | POA: Diagnosis not present

## 2020-07-25 DIAGNOSIS — J449 Chronic obstructive pulmonary disease, unspecified: Secondary | ICD-10-CM | POA: Diagnosis not present

## 2020-07-25 DIAGNOSIS — Z8719 Personal history of other diseases of the digestive system: Secondary | ICD-10-CM | POA: Diagnosis not present

## 2020-07-26 LAB — CULTURE, BLOOD (ROUTINE X 2)
Culture: NO GROWTH
Culture: NO GROWTH
Special Requests: ADEQUATE
Special Requests: ADEQUATE

## 2020-07-29 ENCOUNTER — Ambulatory Visit (INDEPENDENT_AMBULATORY_CARE_PROVIDER_SITE_OTHER): Payer: Medicare Other | Admitting: Ophthalmology

## 2020-07-29 ENCOUNTER — Encounter (INDEPENDENT_AMBULATORY_CARE_PROVIDER_SITE_OTHER): Payer: Self-pay | Admitting: Ophthalmology

## 2020-07-29 ENCOUNTER — Other Ambulatory Visit: Payer: Self-pay

## 2020-07-29 DIAGNOSIS — H43812 Vitreous degeneration, left eye: Secondary | ICD-10-CM

## 2020-07-29 DIAGNOSIS — H34832 Tributary (branch) retinal vein occlusion, left eye, with macular edema: Secondary | ICD-10-CM | POA: Diagnosis not present

## 2020-07-29 NOTE — Assessment & Plan Note (Signed)

## 2020-07-29 NOTE — Assessment & Plan Note (Signed)
CME component of BRVO is now resolved center of macula, thus will observe.  Currently at 21-month interval follow-up

## 2020-07-29 NOTE — Progress Notes (Signed)
07/29/2020     CHIEF COMPLAINT Patient presents for Retina Follow Up (5 Month F/U OU//Pt denies significant changes to [place] OU since last visit. Pt sts, "I think my left eye might be a little worse but I don't know.")   HISTORY OF PRESENT ILLNESS: [name] is a 79 y.o. female who presents to the clinic today for:   HPI    Retina Follow Up    Patient presents with  CRVO/BRVO.  In left eye.  This started 5 months ago.  Severity is mild.  Duration of 5 months.  Since onset it is stable. Additional comments: 5 Month F/U OU  Pt denies significant changes to [place] OU since last visit. Pt sts, "I think my left eye might be a little worse but I don't know."       Last edited by Rockie Neighbours, [place] on 07/29/2020  9:57 AM. (History)      Referring physician: Caren Macadam, MD [place],  [place] [id number]  HISTORICAL INFORMATION:   Selected notes from the MEDICAL RECORD NUMBER    Lab Results  Component Value Date   HGBA1C 5.3 09/14/2016     CURRENT MEDICATIONS: No current outpatient medications on file. (Ophthalmic Drugs)   No current facility-administered medications for this visit. (Ophthalmic Drugs)   Current Outpatient Medications (Other)  Medication Sig  . acetaminophen (TYLENOL) 325 MG tablet Take 325 mg by mouth every 6 (six) hours as needed for moderate pain. Alternates with Ibuprofen  . amLODipine (NORVASC) 5 MG tablet TAKE 1 TABLET BY MOUTH AT BEDTIME (Patient taking differently: Take 5 mg by mouth daily.)  . Budeson-Glycopyrrol-Formoterol (BREZTRI AEROSPHERE) 160-9-4.8 MCG/ACT AERO Inhale 2 puffs into the lungs 2 (two) times daily.  . cholecalciferol (VITAMIN D3) 25 MCG (1000 UNIT) tablet Take 1,000 Units by mouth daily.  Marland Kitchen FLUoxetine (PROZAC) 20 MG capsule Take 1 capsule by mouth twice daily (Patient taking differently: Take 20 mg by mouth 2 (two) times daily.)  . PROAIR HFA 108 (90 Base) MCG/ACT inhaler INHALE 2 PUFFS BY MOUTH EVERY 6  HOURS AS NEEDED FOR WHEEZING OR SHORTNESS OF BREATH (Patient taking differently: Inhale 2 puffs into the lungs every 6 (six) hours as needed for wheezing or shortness of breath. INHALE 2 PUFFS BY MOUTH EVERY 6 HOURS AS NEEDED FOR WHEEZING OR SHORTNESS OF BREATH)  . rosuvastatin (CRESTOR) 5 MG tablet Take 1 tablet by mouth once daily (Patient taking differently: Take 5 mg by mouth daily.)  . vitamin B-12 (CYANOCOBALAMIN) 1000 MCG tablet Take 1,000 mcg by mouth daily.   . vitamin E 400 UNIT capsule Take 400 Units by mouth daily.   No current facility-administered medications for this visit. (Other)      REVIEW OF SYSTEMS:    ALLERGIES Allergies  Allergen Reactions  . Clindamycin/Lincomycin Nausea And Vomiting  . Penicillins Rash       . Spiriva [Tiotropium Bromide Monohydrate]     General intolerance  . Sulfa Antibiotics Hives and Rash    Severe Hives with skin reaction    PAST MEDICAL HISTORY Past Medical History:  Diagnosis Date  . Allergic rhinitis   . Anemia   . Anxiety   . Arthritis    all over  . Bilateral cataracts   . Chicken pox   . Cholelithiasis 05/21/2014   On CT scan 2015   . COPD (chronic obstructive pulmonary disease) ([place])   . Depression    hx ocd and anxiety in  record as well  . Gout    last time 6-75yrs ago   . History of blood transfusion   . Hot flashes    takes Prozac daily  . Hx of migraines 90's   after menopause HA stopped  . Hyperlipidemia    taking Flax Seed Oil and Fish Oil  . Hypertension    takes Amlodipine nightly  . IBS (irritable bowel syndrome)   . Liver cyst   . Macrocytosis   . Osteopenia    takes Calcium and Vit D bid  . Pneumonia    x 2 ;last time back in 1999  . PONV (postoperative nausea and vomiting)   . 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 Seasonal allergies   . Smoking 09/12/2011   Past Surgical History:  Procedure Laterality Date  . CARPAL TUNNEL RELEASE  80's   right   .  COLONOSCOPY    . cyst removed  >6yrs ago   from left leg  . DILATION AND CURETTAGE OF UTERUS     at age 55  . EXCISIONAL TOTAL KNEE ARTHROPLASTY Right 06/03/2014   Procedure: RIGHT KNEE SCAR EXCISION;  Surgeon: [name], MD;  Location: [facility];  Service: Orthopedics;  Laterality: Right;  . INGUINAL HERNIA REPAIR  2000  . INGUINAL HERNIA REPAIR Left 07/18/2020   Procedure: HERNIA  REPAIR INGUINAL ADULT REPAIR;  Surgeon: Michael Boston, MD;  Location: [facility];  Service: General;  Laterality: Left;  . ROTATOR CUFF REPAIR  2001   right  . TONSILLECTOMY  as a child   and adenoids  . TOTAL ABDOMINAL HYSTERECTOMY  2003   benign tumor-[place]  . TOTAL HIP ARTHROPLASTY  2011   left  . TOTAL HIP ARTHROPLASTY  03/07/2012   Procedure: TOTAL HIP ARTHROPLASTY ANTERIOR APPROACH;  Surgeon: [name], MD;  Location: [facility];  Service: Orthopedics;  Laterality: Right;  . TOTAL KNEE ARTHROPLASTY Right 01/02/2013   Procedure: RIGHT TOTAL KNEE ARTHROPLASTY;  Surgeon: [name], MD;  Location: [facility];  Service: Orthopedics;  Laterality: Right;  . TOTAL KNEE ARTHROPLASTY Left 04/03/2013   Procedure: LEFT TOTAL KNEE ARTHROPLASTY;  Surgeon: [name], MD;  Location: [facility];  Service: Orthopedics;  Laterality: Left;  Marland Kitchen VENTRAL HERNIA REPAIR N/A 07/18/2020   Procedure: LAPAROSCOPIC BILATERAL SPIGELIABN HERNIAS WITH MESH, UMBILICAL HERNIA;  Surgeon: Michael Boston, MD;  Location: [facility];  Service: General;  Laterality: N/A;  . VIDEO BRONCHOSCOPY  10/20/2011   Procedure: VIDEO BRONCHOSCOPY;  Surgeon: Grace Isaac, MD;  Location: [facility];  Service: Thoracic;  Laterality: N/A;  . WEDGE RESECTION  10-20-2011   rt lung  - for lung cancer    FAMILY HISTORY Family History  Problem Relation Age of Onset  . Other Mother        polyarteritis  . Bowel Disease Father 72       colectomy/bowel obstruction issues  . Heart attack Father   . Hypertension Brother   . Arthritis Brother   . Post-traumatic  stress disorder Brother   . Alcohol abuse Other   . Depression Other   . Hearing loss Other   . Heart disease Other   . Hypertension Other   . Anesthesia problems Neg Hx   . Hypotension Neg Hx   . Malignant hyperthermia Neg Hx   . Pseudochol deficiency Neg Hx   . Breast cancer Neg Hx     SOCIAL HISTORY  Social History   Tobacco Use  . Smoking status: Former Smoker    Packs/day: 0.75    Years: 50.00    Pack years: 37.50    Types: Cigarettes    Quit date: 09/23/2011    Years since quitting: 8.8  . Smokeless tobacco: Never Used  Vaping Use  . Vaping Use: Never used  Substance Use Topics  . Alcohol use: Yes    Comment: 2 beers every night  . Drug use: No         OPHTHALMIC EXAM:  Base Eye Exam    Visual Acuity (ETDRS)      Right Left   Dist cc 20/30 -1 20/100 -2   Dist ph cc NI 20/60 -1   Correction: Glasses       Tonometry (Tonopen, 9:55 AM)      Right Left   Pressure 13 11       Pupils      Dark Light Shape React APD   Right 5 5 Round Minimal None   Left 4 4 Round Minimal None       Visual Fields (Counting fingers)      Left Right    Full Full       Extraocular Movement      Right Left    Full Full       Neuro/Psych    Oriented x3: Yes   Mood/Affect: Normal       Dilation    Both eyes: 1.0% Mydriacyl, 2.5% Phenylephrine @ 10:01 AM        Slit Lamp and Fundus Exam    External Exam      Right Left   External Normal Normal       Slit Lamp Exam      Right Left   Lids/Lashes Normal Normal   Conjunctiva/Sclera White and quiet White and quiet   Cornea Clear Clear   Anterior Chamber Deep and quiet Deep and quiet   Iris Round and reactive Round and reactive   Lens Posterior chamber intraocular lens Posterior chamber intraocular lens   Anterior Vitreous Normal Normal       Fundus Exam      Right Left   Posterior Vitreous Posterior vitreous detachment Normal   Disc Normal Normal   C/D Ratio 0.45 0.4   Macula Early age related macular  degeneration Geographic atrophy multilobular, no CME, , Microaneurysms, Intermediate age related macular degeneration   Vessels Normal Old retinal vein occlusion, branch.   Periphery Normal Normal          IMAGING AND PROCEDURES  Imaging and Procedures for 07/29/20           ASSESSMENT/PLAN:  Posterior vitreous detachment of left eye   The nature of posterior vitreous detachment was discussed with the patient as well as its physiology, its age prevalence, and its possible implication regarding retinal breaks and detachment.  An informational brochure was given to the patient.  All the patient's questions were answered.  The patient was asked to return if new or different flashes or floaters develops.   Patient was instructed to contact office immediately if any changes were noticed. I explained to the patient that vitreous inside the eye is similar to jello inside a bowl. As the jello melts it can start to pull away from the bowl, similarly the vitreous throughout our lives can begin to pull away from the retina. That process is called a posterior vitreous detachment. In some cases, the vitreous  can tug hard enough on the retina to form a retinal tear. I discussed with the patient the signs and symptoms of a retinal detachment.  Do not rub the eye.  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  Currently at 34-month interval follow-up      ICD-10-CM   1. 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 Posterior vitreous detachment of left eye  H43.812     1.  2.  3.  Ophthalmic Meds Ordered this visit:  No orders of the defined types were placed in this encounter.      Return in about 5 months (around 12/27/2020) for DILATE OU, OCT, COLOR FP.  Patient Instructions  Patient instructed to contact the office promptly for new onset  visual acuity decline or distortions    Explained the diagnoses, plan, and follow up with the patient and they expressed understanding.  Patient expressed understanding of the importance of proper follow up care.   Clent Demark Deyci Gesell M.D. Diseases & Surgery of the Retina and Vitreous Retina [facility] Diabetic [place] 07/29/20     Abbreviations: M myopia (nearsighted); A astigmatism; H hyperopia (farsighted); P presbyopia; Mrx spectacle prescription;  CTL contact lenses; OD right eye; OS left eye; OU both eyes  XT exotropia; ET esotropia; PEK punctate epithelial keratitis; PEE punctate epithelial erosions; DES dry eye syndrome; MGD meibomian gland dysfunction; ATs artificial tears; PFAT's preservative free artificial tears; [place] nuclear sclerotic cataract; PSC posterior subcapsular cataract; ERM epi-retinal membrane; PVD posterior vitreous detachment; RD retinal detachment; DM diabetes mellitus; DR diabetic retinopathy; NPDR non-proliferative diabetic retinopathy; PDR proliferative diabetic retinopathy; CSME clinically significant macular edema; DME diabetic macular edema; dbh dot blot hemorrhages; CWS cotton wool spot; POAG primary open angle glaucoma; C/D cup-to-disc ratio; HVF humphrey visual field; GVF goldmann visual field; OCT optical coherence tomography; IOP intraocular pressure; BRVO Branch retinal vein occlusion; CRVO central retinal vein occlusion; CRAO central retinal artery occlusion; BRAO branch retinal artery occlusion; RT retinal tear; SB scleral buckle; PPV pars plana vitrectomy; VH Vitreous hemorrhage; PRP panretinal laser photocoagulation; IVK intravitreal kenalog; VMT vitreomacular traction; MH Macular hole;  NVD neovascularization of the disc; NVE neovascularization elsewhere; AREDS age related eye disease study; ARMD age related macular degeneration; POAG primary open angle glaucoma; EBMD epithelial/anterior basement membrane dystrophy; ACIOL anterior chamber intraocular lens; IOL  intraocular lens; PCIOL posterior chamber intraocular lens; Phaco/IOL phacoemulsification with intraocular lens placement; [place] photorefractive keratectomy; LASIK laser assisted in situ keratomileusis; HTN hypertension; DM diabetes mellitus; COPD chronic obstructive pulmonary disease

## 2020-07-29 NOTE — Patient Instructions (Signed)
Patient instructed to contact the office promptly for new onset visual acuity decline or distortions 

## 2020-08-04 DIAGNOSIS — I10 Essential (primary) hypertension: Secondary | ICD-10-CM | POA: Diagnosis not present

## 2020-08-04 DIAGNOSIS — C3490 Malignant neoplasm of unspecified part of unspecified bronchus or lung: Secondary | ICD-10-CM | POA: Diagnosis not present

## 2020-08-04 DIAGNOSIS — J9610 Chronic respiratory failure, unspecified whether with hypoxia or hypercapnia: Secondary | ICD-10-CM | POA: Diagnosis not present

## 2020-08-04 DIAGNOSIS — J449 Chronic obstructive pulmonary disease, unspecified: Secondary | ICD-10-CM | POA: Diagnosis not present

## 2020-08-04 DIAGNOSIS — F32A Depression, unspecified: Secondary | ICD-10-CM | POA: Diagnosis not present

## 2020-08-05 DIAGNOSIS — J449 Chronic obstructive pulmonary disease, unspecified: Secondary | ICD-10-CM | POA: Diagnosis not present

## 2020-08-06 ENCOUNTER — Other Ambulatory Visit: Payer: Self-pay

## 2020-08-06 ENCOUNTER — Ambulatory Visit (INDEPENDENT_AMBULATORY_CARE_PROVIDER_SITE_OTHER)
Admission: RE | Admit: 2020-08-06 | Discharge: 2020-08-06 | Disposition: A | Discharge status: Home / Self Care | Payer: Medicare Other | Source: Ambulatory Visit | Attending: Internal Medicine | Admitting: Internal Medicine

## 2020-08-06 DIAGNOSIS — J439 Emphysema, unspecified: Secondary | ICD-10-CM | POA: Diagnosis not present

## 2020-08-06 DIAGNOSIS — I2510 Atherosclerotic heart disease of native coronary artery without angina pectoris: Secondary | ICD-10-CM | POA: Diagnosis not present

## 2020-08-06 DIAGNOSIS — J449 Chronic obstructive pulmonary disease, unspecified: Secondary | ICD-10-CM | POA: Diagnosis not present

## 2020-08-06 DIAGNOSIS — R0902 Hypoxemia: Secondary | ICD-10-CM | POA: Diagnosis not present

## 2020-08-06 DIAGNOSIS — I270 Primary pulmonary hypertension: Secondary | ICD-10-CM | POA: Diagnosis not present

## 2020-08-06 DIAGNOSIS — Z85118 Personal history of other malignant neoplasm of bronchus and lung: Secondary | ICD-10-CM

## 2020-08-06 DIAGNOSIS — Z08 Encounter for follow-up examination after completed treatment for malignant neoplasm: Secondary | ICD-10-CM | POA: Diagnosis not present

## 2020-08-07 DIAGNOSIS — J449 Chronic obstructive pulmonary disease, unspecified: Secondary | ICD-10-CM | POA: Diagnosis not present

## 2020-08-07 DIAGNOSIS — I10 Essential (primary) hypertension: Secondary | ICD-10-CM | POA: Diagnosis not present

## 2020-08-07 DIAGNOSIS — J9610 Chronic respiratory failure, unspecified whether with hypoxia or hypercapnia: Secondary | ICD-10-CM | POA: Diagnosis not present

## 2020-08-07 DIAGNOSIS — E785 Hyperlipidemia, unspecified: Secondary | ICD-10-CM | POA: Diagnosis not present

## 2020-08-07 DIAGNOSIS — C3490 Malignant neoplasm of unspecified part of unspecified bronchus or lung: Secondary | ICD-10-CM | POA: Diagnosis not present

## 2020-08-14 ENCOUNTER — Telehealth: Payer: Self-pay | Admitting: Family Medicine

## 2020-08-14 DIAGNOSIS — J449 Chronic obstructive pulmonary disease, unspecified: Secondary | ICD-10-CM

## 2020-08-14 NOTE — Telephone Encounter (Signed)
Patient's caregiver is calling and stated that patient just had a hernia removed and that patient is going to need O2 permanently and wanted to see if the provider could order a portable oxygen tank, please advise. CB is [phone]

## 2020-08-14 NOTE — Telephone Encounter (Signed)
Ok to order, but we do not have any recent sats/documentation on oxygen that I can see, but we should be able to use hospital discharge. She has visit with pulmonology on 1/18 which is good. I need this info:  *oxygen flow *is it nasal cannula? *what is O2sat on this oxygen flow?  *what is o2 sat without oxygen?  *what is o2 sat with activity (walking) if her O2 sat at rest is 88 or higher

## 2020-08-15 NOTE — Telephone Encounter (Signed)
Spoke with [name] [phone]) and informed her of the message below.  [name] stated the low reading was not while wearing oxygen.  Community care message sent to [employer] stating order is entered as below.

## 2020-08-15 NOTE — Telephone Encounter (Signed)
Ok to put in order for home oxygen (usually they will send [place] form to complete once this is done)  *is the oxygen level at 75-80 with activity while wearing oxygen? If so, she needs to increase flow with activity (I would start with 3L and monitor to try and keep level 88 or higher). If that is without oxygen and with activity (perhaps checked prior to her originally getting oxygen) then please disregard.   Thanks!

## 2020-08-15 NOTE — Telephone Encounter (Signed)
Per Malva Cogan caregiver the answers to questions are as below: -2L of oxygen -nasal -92-97% -not tested without O2 -75-80% with activity  and the company used is [employer].  Message sent to PCP.

## 2020-08-25 NOTE — Progress Notes (Signed)
Several findings. Will discuss 08/26/20 visit Xxxx  IMPRESSION: 1. Severe emphysema. Airway thickening with mild airway plugging in the left lower lobe. 2. Stable appearance of the wedge resection site anteriorly in the right upper lobe. 3. Coronary, aortic arch, and branch vessel atherosclerotic vascular disease. Mild cardiomegaly. 4. Mildly prominent main pulmonary artery as can be seen in pulmonary arterial hypertension. 5. Numerous hypodense hepatic lesions favoring cysts. Enhancement characteristics of the hepatic lesions are not interrogated. 6. Interval deformity of the right anterior third and fourth ribs near the costochondral junction compatible with healing rib fractures. 7. Degenerative glenohumeral arthropathy, right greater than left. 8. Emphysema and aortic atherosclerosis.  Aortic Atherosclerosis (ICD10-I70.0) and Emphysema (ICD10-J43.9).   Electronically Signed   By: Van Clines M.D.   On: 08/06/2020 11:04

## 2020-08-26 ENCOUNTER — Other Ambulatory Visit: Payer: Self-pay

## 2020-08-26 ENCOUNTER — Ambulatory Visit (INDEPENDENT_AMBULATORY_CARE_PROVIDER_SITE_OTHER): Payer: Medicare Other | Admitting: Internal Medicine

## 2020-08-26 ENCOUNTER — Encounter: Payer: Self-pay | Admitting: Internal Medicine

## 2020-08-26 ENCOUNTER — Other Ambulatory Visit: Payer: Self-pay | Admitting: Internal Medicine

## 2020-08-26 VITALS — BP 120/74 | HR 89 | Temp 97.5°F | Ht 62.0 in | Wt 115.8 lb

## 2020-08-26 DIAGNOSIS — Z08 Encounter for follow-up examination after completed treatment for malignant neoplasm: Secondary | ICD-10-CM | POA: Diagnosis not present

## 2020-08-26 DIAGNOSIS — Z85118 Personal history of other malignant neoplasm of bronchus and lung: Secondary | ICD-10-CM

## 2020-08-26 DIAGNOSIS — J449 Chronic obstructive pulmonary disease, unspecified: Secondary | ICD-10-CM | POA: Diagnosis not present

## 2020-08-26 MED ORDER — PROAIR HFA 108 (90 BASE) MCG/ACT IN AERS: 2.0000 | INHALATION_SPRAY | Freq: Four times a day (QID) | RESPIRATORY_TRACT | 3 refills | Status: DC | PRN

## 2020-08-26 NOTE — Progress Notes (Signed)
#COPD-MM genotype - 2013: Pre RUL lobectomy and Pre Rx: fev1 1.1L/60% , DLCO 9.6/56%, TLC 5.6/128%  - 2013 May: Post lobectomy and post Rx: fev1: 1.28L/60%, ratio 49 . No desat walk test - Rx spiriva, symbicort since spring 2013 - 2014 Nov: 06/22/2013. FEV1 is 1.3 L/62%. No post bronchodilator change. FVC is 2.6 L postop day later which is 95%. Ratio is 50. Total lung capacity is 4.96L/104%. DLCO is level 0.7/54%. - On synmbicort alone 04/05/2014 - Walk test today 185 feet x 3 laps on RA: Desat to < 88% at 2 laps - Change to [facility] plus Spiriva Respimat April 2016  # Stage 1 lung cancer: pT1b, pN0 Stage 08 October 2011 - s/p lobectomy by Dr Servando Snare - surveillance by Dr Earlie Server - 05/15/13 - Clear CT. No recurrence - October 2015 - clear CT,No recurrence  -Oct 2016 >no recurrence   09/11/2015 Follow up : COPD  Pt returns for 4 weeks follow up for COPD  PFT done today were reviewed with pt and showed  FEV 1 56%, ratio 49 , FVC 87%,  DLCO 43%  This is down slightly from 2014 (FEV1 62%)  CT chest in Oct 2016 did show mod emphysema .  She remains on Spiriva and BREO .  She has tried Symbicort and Dulera in past.  She says she does get winded with activity -but tried to stay active.  Has minimal cough . Sometimes has some wheezing and sinus drainge.  Denies chest pain, orthopnea, edema or hemoptysis .   Most recent CT chest Oct 2016 survelliance showed no cancer recurrence . Denies any chest pain, orthopnea, PND, leg swelling or hemoptysis.  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 She has questions regarding her prognosis .  Worried she will not be able to help him. "I need to out live him and be able to care for him"  She remains active with Swimming at [employer] and is part of the :"[employer]. "    OV 01/12/2016  Chief Complaint  Patient presents with  . Follow-up    Pt c/o some dry cough and SOB with exertion. Pt denies  wheeze/CP/tightness.    Moderate COPD lung cancer patient  Pulmonary function Feb 2017 showed Gold stage II COPD. CT scan of the chest October 2016 did not show any lung cancer recurrence. She is on triple inhaler therapy. She wants to switch to another version of triple inhaler therapy namely ANORO with and ICS. This is because of friend has had good experience with it. She continues her shortness of breath with exertion but is reluctant to try oxygen. We discussed nocturnal oxygen. She says she will consider the indigent system on her own. COPD cat score is 19. In the interim she has developed mild intention tremor with inhalers are not bothering her as much.   OV 06/08/2016  Chief Complaint  Patient presents with  . Follow-up    Pt state breathing is pretty good. Pt states congestion from allergiesThe [employer] work). Pt c/o no tightness in chest, SOB w/ excertion, Pt states some coughing because of dry air. loves the Anora but can not afford (doughnut hole)     She had CT scan of the chest 05/21/2016 and shows cancer in remission. No new infiltrates. She also had blood work 05/21/2016 with a normal creatinine of 0.8 mg percent and hemoglobin of 14.9 g percent. Overall COPD stable. She does not have any problems she is on triple inhaler therapy  which works well for her. She had baseline has exertional desaturations but is not using oxygen because of reluctance. She is planning to travel to [place] in [place] in 6 months and is inquiring about portable oxygen systems for her travel.   Walking desaturation test 185 feet 3 laps on room air 06/08/2016:Desaturated to 87% at the end of second lap   OV 03/02/2017  Chief Complaint  Patient presents with  . Follow-up    Pt states her breathing is unchanged since last OV. Pt c/o dry cough. Pt denies CP/tightness and f/c/s/.     Follow-up moderate COPD. Since last visit in October 2017 things are stable. She is on triple inhaler therapy. She canceled her  trip to [place]. He insists that she and her husband are going to [place] in [place] in September 2018. She has a follow-up scan for her lung cancer coming up in October 2018. This is from Dr. Julien Nordmann. She uses oxygen with exertion. She is started on the new shingles vaccine. Booster shot is due.  OV 08/31/2017  Chief Complaint  Patient presents with  . Follow-up    has occassional SOB,has non productive cough,uses inhaler anuity,anoro inhalers     Last visit July 2018.  There is a follow-up for moderate COPD.  She continues on triple inhaler therapy.  Since then her COPD CAT score is improved to 13.  Overall she is feeling fine without any exacerbations.  She has had to cancel her trip to [place] and [place] because of her husband's memory issues and back issues.  He is having back surgery in 2 weeks.  She plans to go to [place] and [place] and mid 2019.  She is up-to-date with her flu shot.  There are no main issues.  Earlier this year she had CT scan of the chest for lung cancer surveillance and there is no recurrence.  She has coronary artery calcification but she says she had a stress test in 2013 that was normal.  There is no active chest pain with exertion.      OV 06/09/2018  Subjective:  Patient ID: [name], female , DOB: Jan 27, 1941 , age 80 y.o. , MRN: [id number] , ADDRESS: [place] [id number]   06/09/2018 -   Chief Complaint  Patient presents with  . Follow-up    9 month follow up for COPD. Per patient her breathing has been up and down since her last visit. Cold weather doesn't seem to bother her. Wants to discuss a possible chest vest. Is requesting a CT chest since she hasn't one in almost 2 years.       HPI [name] 80 y.o. -female   Moderate COPD follow-up: Doing well.  She feels symptoms are baseline and well-controlled.  Takes Anoro daily.  Takes Arnuity at night but occasionally will not take it.  Is up-to-date with the flu shot and  pneumonia vaccines.  Lung cancer surveillance: Last CT scan of the chest October 2018 without tumor recurrence.  She somewhat thinks it has been 2 years since she had a previous scan.  She is asking for repeat scan.  No other new issues   OV 05/23/2019  Subjective:  Patient ID: Ovidio Hanger, female , DOB: 1941-04-09 , age 10 y.o. , MRN: [id number] , ADDRESS: [place] [address]   05/23/2019 -   Chief Complaint  Patient presents with  . Moderate COPD (chronic obstructive pulmonary disease)    Has noticed  she is not able to work in yard as long as she could last year. But feels her medications are still working. Using the ProAir more than she used to.   -  Gold stage II COPD follow-up -Lung cancer screening follow-up  HPI Brisha Dealie Koelzer 80 y.o. -presents for follow-up.  It is almost 1 year since I saw her.  She tells me in March 2020 she had influenza and pneumonia.  Since then she has had a deterioration in her symptoms.  She is not able to do yard work as much.  She is on Anoro and Arnuity and she is asking for something stronger.  Looking at his COPD CAT score it appears she is stable actually.  She is not on oxygen.  Her last CT scan was in November 2019 without any recurrence of lung cancer.  After that she did have cardiac stress test and this was normal.  She is neither on night oxygen or day oxygen.  She is interested in an inhaler upgrade.  She is also open to the idea of pulmonary rehabilitation.  She also tells me that it has been many years since she had pulmonary function test and she is open to getting this retested as well as testing her oxygen levels.   IMPRESSION: 1. No evidence of recurrent or metastatic disease. 2. Cholelithiasis. 3. Aortic atherosclerosis (ICD10-170.0). Three-vessel coronary artery calcification. 4. Enlarged pulmonary arteries, indicative of pulmonary arterial hypertension. 5.  Emphysema  (ICD10-J43.9).   Electronically Signed   By: Lorin Picket M.D.   On: 06/19/2018 11:34   OV 06/18/2019  Subjective:  Patient ID: Ovidio Hanger, female , DOB: [birth year] , age 3 y.o. , MRN: [id number] , ADDRESS: [place] [address]   06/18/2019 -   Chief Complaint  Patient presents with  . Follow-up    PFT performed 06/11/2019 and is here today to go over the results. Pt said that she has had some more SOB which is worse when she works outside and also has a dry cough.   Moderate COPD follow-up  HPI Azizi Bally 80 y.o. -this visit is to follow-up for test results of overnight desaturation study, pulmonary function test and CT scan of the chest.  This is because she has moderate COPD and she started feeling worse.  Partly because she was not attending pulmonary rehabilitation in the midst of the pandemic.  [facility]  In addition last visit we switched her to Trelegy.  With this her symptoms are better subjectively although on the CAT score they seem to be a little bit worse.  She is liking Trelegy daily.  She is now using nighttime oxygen after oxygen and nighttime desaturation test was positive.  She has been using this for 1 week.  She does not notice any difference.  She is not sure she wants to take this.  Her last walking desaturation test was some years ago according to her history.  She is asking about other medications for COPD.  We discussed chronic daily prednisone and Roflumilast and decided against this.  This because she is not a frequent exacerbate her.  She was supposed to have CT scan of the chest but this is scheduled for next week.  This visit got scheduled ahead of time.  Her pulmonary function test shows some mild age-related decline in FEV1 and DLCO but otherwise unremarkable.  Walking desaturation test today 185 feet x 3 laps  on room air: She got to the lowest pulse ox of 89% and quickly bounced  back.  As she walked more she will drop to 88%.  But she does not want to take portable oxygen with her.     OV 04/15/2020   Subjective:  Patient ID: Ovidio Hanger, female , DOB: 1941/07/19, age 62 y.o. years. , MRN: [id number],  ADDRESS: [place] [address] PCP  Caren Macadam, MD Providers : Treatment Team:  Attending Provider: Brand Males, MD   Chief Complaint  Patient presents with  . Follow-up    breathing was better.  had oxygen picked up, it was loud and could not sleep.  less energy without the oxygen.  dry cough.  sob with exertion only.      Follow-up stage II COPD Follow-up lung cancer surveillance   HPI Pennie Vanblarcom 80 y.o. -last seen November 2020.  After that she had CT scan of the chest that did not show any evidence of lung cancer.  She continues to do well.  She has nighttime oxygen but she says the machine is noisy and she wants to return it because she does not use it.  Last visit she had borderline desaturations.  This visit we walked her in very quickly and within half a lap she desaturated to 87%.  Recommended portable oxygen.  She is willing to use that but she wants to use the machine more at night.  She is using Trelegy inhaler.  She is asking for the competition inhaler BREZTRI after seeing advertisement on that on television.  I advised her that both the same.  She nevertheless she wants to try it if we have a sample.  There are no other new issues.  Her husband Anglia Blakley who is also my patient is continuing to have memory issues.    CAT Score 04/15/2020 05/23/2019  Total CAT Score 18 14      CAT COPD Symptom & Quality of Life Score (GSK trademark) 0 is no burden. 5 is highest burden 01/12/2016  08/31/2017  05/23/2019  06/18/2019   Never Cough -> Cough all the time3 2 2 2 3   No phlegm in chest -> Chest is full 1of phlegm 2 1 2 1   No chest tightness -> Chest feels v0ery tight 1 0 0 0  No dyspnea for 1 flight  stairs/hill -> V3ery dyspneic for 1 flight of stairs 4 3 4 3   No limitations for ADL at home -> Ver4y limited with ADL at home 2 3 2 4   Confident leaving home -> Not at all c1onfident leaving home 1 0 1 1  Sleep soundly -> Do not sleep soundly2 because of lung condition 3 2 1 2   Lots of Energy -> No energy at all 4 2 2 3   TOTAL Score (max 40)  19 13 14 17    PFT Results Latest Ref Rng & Units 06/11/2019 09/10/2015  FVC-Pre L 2.14 2.33  FVC-Predicted Pre % 84 87  FVC-Post L - 2.27  FVC-Predicted Post % - 84  Pre FEV1/FVC % % 49 49  Post FEV1/FCV % % - 49  FEV1-Pre L 1.04 1.14  FEV1-Predicted Pre % 55 56  FEV1-Post L - 1.12  DLCO uncorrected ml/min/mmHg 7.53 9.44  DLCO UNC% % 42 43  DLVA Predicted % 52 56  TLC L - 5.46  TLC % Predicted % - 115  RV % Predicted % - 143   CT chest Nov 2020  IMPRESSION: 1. Stable exam.  No new or progressive interval findings. 2.  Emphysema. (ICD10-J43.9) 3. Enlargement of the pulmonary arteries, compatible with pulmonary arterial hypertension. 4.  Aortic Atherosclerois (ICD10-170.0)   Electronically Signed   By: Misty Stanley M.D.   On: 06/25/2019 12:50   ROS - per HPI  Simple office walk 185 feet x  3 laps goal with forehead probe 04/15/2020   O2 used ra  Number laps completed 1 lap of 3  Comments about pace Slow pace. tropped over cane x 3  Resting Pulse Ox/HR 98% and 77/min  Final Pulse Ox/HR 87% and 101/min  Desaturated </= 88% yes  Desaturated <= 3% points yes  Got Tachycardic >/= 90/min yes  Symptoms at end of test Dyspneic at half lap  Miscellaneous comments desats at half lap but completed 1 laps      OV 08/26/2020  Subjective:  Patient ID: Ovidio Hanger, female , DOB: 08-15-1940 , age 33 y.o. , MRN: [id number] , ADDRESS: [place] [address] PCP Caren Macadam, MD Patient Care Team: Caren Macadam, MD as PCP - General (Family Medicine) Brand Males, MD (Pulmonary  Disease) Grace Isaac, MD (Cardiothoracic Surgery) Lorretta Harp, MD as Consulting Physician (Cardiology) Curt Bears, MD as Consulting Physician (Hematology and Oncology) Michael Boston, MD as Consulting Physician (General Surgery) Michael Boston, MD as Consulting Physician (General Surgery)  This Provider for this visit: Treatment Team:  Attending Provider: Brand Males, MD    08/26/2020 -   Chief Complaint  Patient presents with  . Follow-up    Doing well, not using O2 throughout the day     HPI Alayah Knouff 80 y.o. -presents for follow-up.  I personally last saw her in September 2021.  After that in November 2020 when she saw nurse practitioner and got preop clearance for ventral hernia repair.  She says she had a successful surgery but had postop delirium.  She ended up deconditioning and rehabilitation.  She is using a walker since then.  Her new CT chest in December 2021 shows healing rib fractures on the right side.  She does not recollect a specific trauma but feels it is because of some fall she might of had out of the bed.  Denies any domestic violence.  Husband is doing well.  He has some mild dementia.  For the COPD she is on BREZTRI and likes this better than the Trelegy.  She has not had any exacerbations.  Her CT scan for lung cancer surveillance does not show any evidence of lung cancer recurrence.  Overall she is reporting good health.    CT Chest data  No results found.    PFT  PFT Results Latest Ref Rng & Units 06/11/2019 09/10/2015  FVC-Pre L 2.14 2.33  FVC-Predicted Pre % 84 87  FVC-Post L - 2.27  FVC-Predicted Post % - 84  Pre FEV1/FVC % % 49 49  Post FEV1/FCV % % - 49  FEV1-Pre L 1.04 1.14  FEV1-Predicted Pre % 55 56  FEV1-Post L - 1.12  DLCO uncorrected ml/min/mmHg 7.53 9.44  DLCO UNC% % 42 43  DLVA Predicted % 52 56  TLC L - 5.46  TLC % Predicted % - 115  RV % Predicted % - 143    IMPRESSION: 1. Severe emphysema.  Airway thickening with mild airway plugging in the left lower lobe. 2. Stable appearance of the wedge resection site anteriorly in the right upper lobe. 3. Coronary,  aortic arch, and branch vessel atherosclerotic vascular disease. Mild cardiomegaly. 4. Mildly prominent main pulmonary artery as can be seen in pulmonary arterial hypertension. 5. Numerous hypodense hepatic lesions favoring cysts. Enhancement characteristics of the hepatic lesions are not interrogated. 6. Interval deformity of the right anterior third and fourth ribs near the costochondral junction compatible with healing rib fractures. 7. Degenerative glenohumeral arthropathy, right greater than left. 8. Emphysema and aortic atherosclerosis.  Aortic Atherosclerosis (ICD10-I70.0) and Emphysema (ICD10-J43.9).   Electronically Signed   By: Van Clines M.D.   On: 08/06/2020 11:04     has a past medical history of Allergic rhinitis, Anemia, Anxiety, Arthritis, Bilateral cataracts, Chicken pox, Cholelithiasis (05/21/2014), COPD (chronic obstructive pulmonary disease) ([facility]), Depression, Gout, History of blood transfusion, Hot flashes, migraines (90's), Hyperlipidemia, Hypertension, IBS (irritable bowel syndrome), Liver cyst, Macrocytosis, Osteopenia, Pneumonia, PONV (postoperative nausea and vomiting), S/P right THA, AA - Dr. 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   reports that she quit smoking about 8 years ago. Her smoking use included cigarettes. She has a 37.50 pack-year smoking history. She has never used smokeless tobacco.  Past Surgical History:  Procedure Laterality Date  . CARPAL TUNNEL RELEASE  80's   right   . COLONOSCOPY    . cyst removed  >26yrs ago   from left leg  . DILATION AND CURETTAGE OF UTERUS     at age 8  . EXCISIONAL TOTAL KNEE ARTHROPLASTY Right 06/03/2014   Procedure: RIGHT KNEE SCAR EXCISION;  Surgeon: [name], MD;   Location: [facility];  Service: Orthopedics;  Laterality: Right;  . INGUINAL HERNIA REPAIR  2000  . INGUINAL HERNIA REPAIR Left 07/18/2020   Procedure: HERNIA  REPAIR INGUINAL ADULT REPAIR;  Surgeon: Michael Boston, MD;  Location: [facility];  Service: General;  Laterality: Left;  . ROTATOR CUFF REPAIR  2001   right  . TONSILLECTOMY  as a child   and adenoids  . TOTAL ABDOMINAL HYSTERECTOMY  2003   benign tumor-[facility]  . TOTAL HIP ARTHROPLASTY  2011   left  . TOTAL HIP ARTHROPLASTY  03/07/2012   Procedure: TOTAL HIP ARTHROPLASTY ANTERIOR APPROACH;  Surgeon: [name], MD;  Location: [facility];  Service: Orthopedics;  Laterality: Right;  . TOTAL KNEE ARTHROPLASTY Right 01/02/2013   Procedure: RIGHT TOTAL KNEE ARTHROPLASTY;  Surgeon: [name], MD;  Location: [facility];  Service: Orthopedics;  Laterality: Right;  . TOTAL KNEE ARTHROPLASTY Left 04/03/2013   Procedure: LEFT TOTAL KNEE ARTHROPLASTY;  Surgeon: [name], MD;  Location: [facility];  Service: Orthopedics;  Laterality: Left;  Marland Kitchen VENTRAL HERNIA REPAIR N/A 07/18/2020   Procedure: LAPAROSCOPIC BILATERAL SPIGELIABN HERNIAS WITH MESH, UMBILICAL HERNIA;  Surgeon: Michael Boston, MD;  Location: [facility];  Service: General;  Laterality: N/A;  . VIDEO BRONCHOSCOPY  10/20/2011   Procedure: VIDEO BRONCHOSCOPY;  Surgeon: Grace Isaac, MD;  Location: [facility];  Service: Thoracic;  Laterality: N/A;  . WEDGE RESECTION  10-20-2011   rt lung  - for lung cancer    Allergies  Allergen Reactions  . Clindamycin/Lincomycin Nausea And Vomiting  . Penicillins Rash       . Spiriva [Tiotropium Bromide Monohydrate]     General intolerance  . Sulfa Antibiotics Hives and Rash    Severe Hives with skin reaction    Immunization History  Administered Date(s) Administered  . Fluad Quad(high Dose 65+) 04/10/2019  . Influenza Split 06/02/2011, 05/09/2012  . Influenza, High Dose  Seasonal PF 04/19/2016, 04/27/2017, 05/18/2018  . Influenza,inj,Quad PF,6+ Mos  05/18/2013, 04/17/2014, 05/21/2015  . PFIZER(Purple Top)SARS-COV-2 Vaccination 09/15/2019, 10/10/2019  . Pneumococcal Conjugate-13 04/05/2014  . Pneumococcal Polysaccharide-23 08/09/1998, 09/21/2011  . Tdap 06/22/2012, 07/08/2020  . Zoster 08/10/2007    Family History  Problem Relation Age of Onset  . Other Mother        polyarteritis  . Bowel Disease Father 42       colectomy/bowel obstruction issues  . Heart attack Father   . Hypertension Brother   . Arthritis Brother   . Post-traumatic stress disorder Brother   . Alcohol abuse Other   . Depression Other   . Hearing loss Other   . Heart disease Other   . Hypertension Other   . Anesthesia problems Neg Hx   . Hypotension Neg Hx   . Malignant hyperthermia Neg Hx   . Pseudochol deficiency Neg Hx   . Breast cancer Neg Hx      Current Outpatient Medications:  .  acetaminophen (TYLENOL) 325 MG tablet, Take 325 mg by mouth every 6 (six) hours as needed for moderate pain. Alternates with Ibuprofen, Disp: , Rfl:  .  amLODipine (NORVASC) 5 MG tablet, TAKE 1 TABLET BY MOUTH AT BEDTIME (Patient taking differently: Take 5 mg by mouth daily.), Disp: 90 tablet, Rfl: 0 .  Budeson-Glycopyrrol-Formoterol (BREZTRI AEROSPHERE) 160-9-4.8 MCG/ACT AERO, Inhale 2 puffs into the lungs 2 (two) times daily., Disp: 10.7 g, Rfl: 3 .  cholecalciferol (VITAMIN D3) 25 MCG (1000 UNIT) tablet, Take 1,000 Units by mouth daily., Disp: , Rfl:  .  FLUoxetine (PROZAC) 20 MG capsule, Take 1 capsule by mouth twice daily (Patient taking differently: Take 20 mg by mouth 2 (two) times daily.), Disp: 180 capsule, Rfl: 0 .  PROAIR HFA 108 (90 Base) MCG/ACT inhaler, INHALE 2 PUFFS BY MOUTH EVERY 6 HOURS AS NEEDED FOR WHEEZING OR SHORTNESS OF BREATH (Patient taking differently: Inhale 2 puffs into the lungs every 6 (six) hours as needed for wheezing or shortness of breath. INHALE 2 PUFFS BY MOUTH EVERY 6 HOURS AS NEEDED FOR WHEEZING OR SHORTNESS OF BREATH), Disp: 18 g, Rfl:  3 .  rosuvastatin (CRESTOR) 5 MG tablet, Take 1 tablet by mouth once daily (Patient taking differently: Take 5 mg by mouth daily.), Disp: 90 tablet, Rfl: 0 .  vitamin B-12 (CYANOCOBALAMIN) 1000 MCG tablet, Take 1,000 mcg by mouth daily. , Disp: , Rfl:  .  vitamin E 400 UNIT capsule, Take 400 Units by mouth daily., Disp: , Rfl:       Objective:   Vitals:   08/26/20 1427  BP: 120/74  Pulse: 89  Temp: (!) 97.5 F (36.4 C)  TempSrc: Oral  SpO2: 94%  Weight: 115 lb 12.8 oz (52.5 kg)  Height: 5\' 2"  (1.575 m)    Estimated body mass index is 21.18 kg/m as calculated from the following:   Height as of this encounter: 5\' 2"  (1.575 m).   Weight as of this encounter: 115 lb 12.8 oz (52.5 kg).  @WEIGHTCHANGE @  [employer]   08/26/20 1427  Weight: 115 lb 12.8 oz (52.5 kg)     Physical Exam General: No distress. Has walke Neuro: Alert and Oriented x 3. GCS 15. Speech normal Psych: Pleasant Resp:  Barrel Chest - yes.  Wheeze - no, Crackles - no, No overt respiratory distress CVS: Normal heart sounds. Murmurs - no Ext: Stigmata of Connective Tissue Disease - no HEENT: Normal upper airway. PEERL +. No post nasal  drip        Assessment:       ICD-10-CM   1. Moderate COPD (chronic obstructive pulmonary disease) (HCC)  J44.9   2. Encounter for follow-up surveillance of lung cancer  Z08    Z85.118        Plan:     Patient Instructions  Moderate COPD (chronic obstructive pulmonary disease), Sees Dr. Chase Caller, Pulm Encounter for follow-up surveillance of lung cancer   COPD -  - this is stable  But you do have night and exertionall hypoxemia - glad breztri working well for you and better than trelegy  Plan  - continue breztri 2 puf twice daily instead of Trelegy for 1 month  -Let us know via phone call if this is better for you - Use albuterol as needed.  -CMA to do new refill -Use portable o2 as needed -Do not recommend Roflumilast or chronic daily prednisone at  this point due to lack of repeated COPD exacerbations [prednisone should be of last resort and COPD]   Lung cancer surveillance:  -CT scan of the chest November 2020 and dec 2021 without any cancer  Plan -- .  Repeat CT scan of the chest without contrast in December 2022 (if  Insurance willl approve)  Plan -6 months or sooner if needed;  -  CAT score at follow-up     SIGNATURE    Dr. Brand Males, M.D., F.C.C.P,  Pulmonary and Critical Care Medicine Staff Physician, [place] Director - Interstitial Lung Disease  Program  Pulmonary [place] [facility] [place], [place], [address]  Pager: [phone], If no answer or between  15:00h - 7:00h: call [phone] Telephone: [phone]  3:18 PM 08/26/2020

## 2020-08-26 NOTE — Patient Instructions (Addendum)
Moderate COPD (chronic obstructive pulmonary disease), Sees Dr. Chase Caller, Pulm Encounter for follow-up surveillance of lung cancer   COPD -  - this is stable  But you do have night and exertionall hypoxemia - glad breztri working well for you and better than trelegy  Plan  - continue breztri 2 puf twice daily instead of Trelegy for 1 month  -Let us know via phone call if this is better for you - Use albuterol as needed.  -CMA to do new refill -Use portable o2 as needed -Do not recommend Roflumilast or chronic daily prednisone at this point due to lack of repeated COPD exacerbations [prednisone should be of last resort and COPD]   Lung cancer surveillance:  -CT scan of the chest November 2020 and dec 2021 without any cancer  Plan -- .  Repeat CT scan of the chest without contrast in December 2022 (if  Insurance willl approve)  Plan -6 months or sooner if needed;  -  CAT score at follow-up

## 2020-08-29 ENCOUNTER — Ambulatory Visit: Payer: Medicare Other | Admitting: Family Medicine

## 2020-08-29 DIAGNOSIS — J449 Chronic obstructive pulmonary disease, unspecified: Secondary | ICD-10-CM | POA: Diagnosis not present

## 2020-08-29 DIAGNOSIS — Z87891 Personal history of nicotine dependence: Secondary | ICD-10-CM | POA: Diagnosis not present

## 2020-08-29 DIAGNOSIS — Z9981 Dependence on supplemental oxygen: Secondary | ICD-10-CM | POA: Diagnosis not present

## 2020-08-29 DIAGNOSIS — J9611 Chronic respiratory failure with hypoxia: Secondary | ICD-10-CM | POA: Diagnosis not present

## 2020-08-29 DIAGNOSIS — Z85110 Personal history of malignant carcinoid tumor of bronchus and lung: Secondary | ICD-10-CM | POA: Diagnosis not present

## 2020-08-29 DIAGNOSIS — E1122 Type 2 diabetes mellitus with diabetic chronic kidney disease: Secondary | ICD-10-CM | POA: Diagnosis not present

## 2020-08-29 DIAGNOSIS — I129 Hypertensive chronic kidney disease with stage 1 through stage 4 chronic kidney disease, or unspecified chronic kidney disease: Secondary | ICD-10-CM | POA: Diagnosis not present

## 2020-08-29 DIAGNOSIS — N1832 Chronic kidney disease, stage 3b: Secondary | ICD-10-CM | POA: Diagnosis not present

## 2020-08-30 DIAGNOSIS — J9611 Chronic respiratory failure with hypoxia: Secondary | ICD-10-CM | POA: Diagnosis not present

## 2020-08-30 DIAGNOSIS — Z9981 Dependence on supplemental oxygen: Secondary | ICD-10-CM | POA: Diagnosis not present

## 2020-08-30 DIAGNOSIS — I129 Hypertensive chronic kidney disease with stage 1 through stage 4 chronic kidney disease, or unspecified chronic kidney disease: Secondary | ICD-10-CM | POA: Diagnosis not present

## 2020-08-30 DIAGNOSIS — N1832 Chronic kidney disease, stage 3b: Secondary | ICD-10-CM | POA: Diagnosis not present

## 2020-08-30 DIAGNOSIS — Z85110 Personal history of malignant carcinoid tumor of bronchus and lung: Secondary | ICD-10-CM | POA: Diagnosis not present

## 2020-08-30 DIAGNOSIS — Z87891 Personal history of nicotine dependence: Secondary | ICD-10-CM | POA: Diagnosis not present

## 2020-08-30 DIAGNOSIS — J449 Chronic obstructive pulmonary disease, unspecified: Secondary | ICD-10-CM | POA: Diagnosis not present

## 2020-08-30 DIAGNOSIS — E1122 Type 2 diabetes mellitus with diabetic chronic kidney disease: Secondary | ICD-10-CM | POA: Diagnosis not present

## 2020-09-04 ENCOUNTER — Ambulatory Visit: Payer: Medicare Other | Admitting: Family Medicine

## 2020-09-04 DIAGNOSIS — Z9981 Dependence on supplemental oxygen: Secondary | ICD-10-CM | POA: Diagnosis not present

## 2020-09-04 DIAGNOSIS — J9611 Chronic respiratory failure with hypoxia: Secondary | ICD-10-CM | POA: Diagnosis not present

## 2020-09-04 DIAGNOSIS — Z85110 Personal history of malignant carcinoid tumor of bronchus and lung: Secondary | ICD-10-CM | POA: Diagnosis not present

## 2020-09-04 DIAGNOSIS — N1832 Chronic kidney disease, stage 3b: Secondary | ICD-10-CM | POA: Diagnosis not present

## 2020-09-04 DIAGNOSIS — I129 Hypertensive chronic kidney disease with stage 1 through stage 4 chronic kidney disease, or unspecified chronic kidney disease: Secondary | ICD-10-CM | POA: Diagnosis not present

## 2020-09-04 DIAGNOSIS — Z87891 Personal history of nicotine dependence: Secondary | ICD-10-CM | POA: Diagnosis not present

## 2020-09-04 DIAGNOSIS — E1122 Type 2 diabetes mellitus with diabetic chronic kidney disease: Secondary | ICD-10-CM | POA: Diagnosis not present

## 2020-09-04 DIAGNOSIS — J449 Chronic obstructive pulmonary disease, unspecified: Secondary | ICD-10-CM | POA: Diagnosis not present

## 2020-09-11 DIAGNOSIS — J449 Chronic obstructive pulmonary disease, unspecified: Secondary | ICD-10-CM | POA: Diagnosis not present

## 2020-09-11 DIAGNOSIS — Z85110 Personal history of malignant carcinoid tumor of bronchus and lung: Secondary | ICD-10-CM | POA: Diagnosis not present

## 2020-09-11 DIAGNOSIS — E1122 Type 2 diabetes mellitus with diabetic chronic kidney disease: Secondary | ICD-10-CM | POA: Diagnosis not present

## 2020-09-11 DIAGNOSIS — Z87891 Personal history of nicotine dependence: Secondary | ICD-10-CM | POA: Diagnosis not present

## 2020-09-11 DIAGNOSIS — Z9981 Dependence on supplemental oxygen: Secondary | ICD-10-CM | POA: Diagnosis not present

## 2020-09-11 DIAGNOSIS — I129 Hypertensive chronic kidney disease with stage 1 through stage 4 chronic kidney disease, or unspecified chronic kidney disease: Secondary | ICD-10-CM | POA: Diagnosis not present

## 2020-09-11 DIAGNOSIS — N1832 Chronic kidney disease, stage 3b: Secondary | ICD-10-CM | POA: Diagnosis not present

## 2020-09-11 DIAGNOSIS — J9611 Chronic respiratory failure with hypoxia: Secondary | ICD-10-CM | POA: Diagnosis not present

## 2020-09-15 ENCOUNTER — Ambulatory Visit: Payer: Medicare Other | Admitting: Family Medicine

## 2020-09-15 NOTE — Progress Notes (Deleted)
  [name] DOB: [birth year] Encounter date: 09/15/2020  This is a 80 y.o. female who presents with No chief complaint on file.   History of present illness: Patient was admitted in December after having an elective hernia repair and on postoperative day 1 developed wheezing and hypoxia.  Patient had altered mental status secondary to hypoxia.  CT of the head did not reveal any acute changes.  She was discharged to skilled nursing facility for rehabilitation.  She did follow-up with pulmonology 08/26/2020.  Plan was to continue breztri 2 puffs BID; albuterol prn, portable O2 prn.     HPI   Allergies  Allergen Reactions  . Clindamycin/Lincomycin Nausea And Vomiting  . Penicillins Rash       . Spiriva [Tiotropium Bromide Monohydrate]     General intolerance  . Sulfa Antibiotics Hives and Rash    Severe Hives with skin reaction   No outpatient medications have been marked as taking for the 09/15/20 encounter (Appointment) with Caren Macadam, MD.    Review of Systems  Objective:  There were no vitals taken for this visit.      BP Readings from Last 3 Encounters:  08/26/20 120/74  07/24/20 133/83  07/16/20 (!) 149/101   Wt Readings from Last 3 Encounters:  08/26/20 115 lb 12.8 oz (52.5 kg)  07/19/20 119 lb 0.8 oz (54 kg)  07/16/20 121 lb (54.9 kg)    Physical Exam  Assessment/Plan  There are no diagnoses linked to this encounter.       Micheline Rough, MD

## 2020-09-17 DIAGNOSIS — E1122 Type 2 diabetes mellitus with diabetic chronic kidney disease: Secondary | ICD-10-CM | POA: Diagnosis not present

## 2020-09-17 DIAGNOSIS — Z87891 Personal history of nicotine dependence: Secondary | ICD-10-CM | POA: Diagnosis not present

## 2020-09-17 DIAGNOSIS — I129 Hypertensive chronic kidney disease with stage 1 through stage 4 chronic kidney disease, or unspecified chronic kidney disease: Secondary | ICD-10-CM | POA: Diagnosis not present

## 2020-09-17 DIAGNOSIS — Z85110 Personal history of malignant carcinoid tumor of bronchus and lung: Secondary | ICD-10-CM | POA: Diagnosis not present

## 2020-09-17 DIAGNOSIS — J9611 Chronic respiratory failure with hypoxia: Secondary | ICD-10-CM | POA: Diagnosis not present

## 2020-09-17 DIAGNOSIS — Z9981 Dependence on supplemental oxygen: Secondary | ICD-10-CM | POA: Diagnosis not present

## 2020-09-17 DIAGNOSIS — N1832 Chronic kidney disease, stage 3b: Secondary | ICD-10-CM | POA: Diagnosis not present

## 2020-09-17 DIAGNOSIS — J449 Chronic obstructive pulmonary disease, unspecified: Secondary | ICD-10-CM | POA: Diagnosis not present

## 2020-09-18 ENCOUNTER — Other Ambulatory Visit: Payer: Self-pay

## 2020-09-19 ENCOUNTER — Ambulatory Visit (INDEPENDENT_AMBULATORY_CARE_PROVIDER_SITE_OTHER): Payer: Medicare Other | Admitting: Family Medicine

## 2020-09-19 ENCOUNTER — Encounter: Payer: Self-pay | Admitting: Family Medicine

## 2020-09-19 VITALS — BP 128/88 | HR 76 | Temp 97.8°F | Ht 62.0 in | Wt 115.9 lb

## 2020-09-19 DIAGNOSIS — R5383 Other fatigue: Secondary | ICD-10-CM | POA: Diagnosis not present

## 2020-09-19 DIAGNOSIS — I10 Essential (primary) hypertension: Secondary | ICD-10-CM | POA: Diagnosis not present

## 2020-09-19 DIAGNOSIS — E538 Deficiency of other specified B group vitamins: Secondary | ICD-10-CM

## 2020-09-19 DIAGNOSIS — R634 Abnormal weight loss: Secondary | ICD-10-CM | POA: Diagnosis not present

## 2020-09-19 DIAGNOSIS — D649 Anemia, unspecified: Secondary | ICD-10-CM

## 2020-09-19 DIAGNOSIS — E782 Mixed hyperlipidemia: Secondary | ICD-10-CM | POA: Diagnosis not present

## 2020-09-19 DIAGNOSIS — J449 Chronic obstructive pulmonary disease, unspecified: Secondary | ICD-10-CM

## 2020-09-19 LAB — IBC + FERRITIN
Ferritin: 106.2 ng/mL (ref 10.0–291.0)
Iron: 46 ug/dL (ref 42–145)
Saturation Ratios: 16.3 % — ABNORMAL LOW (ref 20.0–50.0)
Transferrin: 202 mg/dL — ABNORMAL LOW (ref 212.0–360.0)

## 2020-09-19 LAB — CBC WITH DIFFERENTIAL/PLATELET
Basophils Absolute: 0.1 10*3/uL (ref 0.0–0.1)
Basophils Relative: 1.8 % (ref 0.0–3.0)
Eosinophils Absolute: 0.1 10*3/uL (ref 0.0–0.7)
Eosinophils Relative: 2.2 % (ref 0.0–5.0)
HCT: 40.7 % (ref 36.0–46.0)
Hemoglobin: 13.9 g/dL (ref 12.0–15.0)
Lymphocytes Relative: 15 % (ref 12.0–46.0)
Lymphs Abs: 0.8 10*3/uL (ref 0.7–4.0)
MCHC: 34.1 g/dL (ref 30.0–36.0)
MCV: 94 fl (ref 78.0–100.0)
Monocytes Absolute: 0.4 10*3/uL (ref 0.1–1.0)
Monocytes Relative: 8.3 % (ref 3.0–12.0)
Neutro Abs: 3.7 10*3/uL (ref 1.4–7.7)
Neutrophils Relative %: 72.7 % (ref 43.0–77.0)
Platelets: 185 10*3/uL (ref 150.0–400.0)
RBC: 4.33 Mil/uL (ref 3.87–5.11)
RDW: 13.6 % (ref 11.5–15.5)
WBC: 5.1 10*3/uL (ref 4.0–10.5)

## 2020-09-19 LAB — COMPREHENSIVE METABOLIC PANEL
ALT: 7 U/L (ref 0–35)
AST: 13 U/L (ref 0–37)
Albumin: 4.3 g/dL (ref 3.5–5.2)
Alkaline Phosphatase: 63 U/L (ref 39–117)
BUN: 10 mg/dL (ref 6–23)
CO2: 29 mEq/L (ref 19–32)
Calcium: 9.4 mg/dL (ref 8.4–10.5)
Chloride: 101 mEq/L (ref 96–112)
Creatinine, Ser: 0.88 mg/dL (ref 0.40–1.20)
GFR: 63.44 mL/min (ref >60.00)
Glucose, Bld: 79 mg/dL (ref 70–99)
Potassium: 3.7 mEq/L (ref 3.5–5.1)
Sodium: 138 mEq/L (ref 135–145)
Total Bilirubin: 0.5 mg/dL (ref 0.2–1.2)
Total Protein: 6.6 g/dL (ref 6.0–8.3)

## 2020-09-19 LAB — VITAMIN B12: Vitamin B-12: 795 pg/mL (ref 211–911)

## 2020-09-19 LAB — TSH: TSH: 2.11 u[IU]/mL (ref 0.35–4.50)

## 2020-09-19 LAB — LIPID PANEL
Cholesterol: 166 mg/dL (ref 0–200)
HDL: 67 mg/dL (ref >39.00)
LDL Cholesterol: 62 mg/dL (ref 0–99)
NonHDL: 98.81
Total CHOL/HDL Ratio: 2
Triglycerides: 182 mg/dL — ABNORMAL HIGH (ref 0.0–149.0)
VLDL: 36.4 mg/dL (ref 0.0–40.0)

## 2020-09-19 NOTE — Addendum Note (Signed)
Addended by: Tessie Fass D on: 09/19/2020 10:10 AM   Modules accepted: Orders

## 2020-09-19 NOTE — Progress Notes (Signed)
[name] DOB: Nov 26, 1940 Encounter date: 09/19/2020  This is a 80 y.o. female who presents with Chief Complaint  Patient presents with  . Hospitalization Follow-up    History of present illness: She states she is feeling fine now. Energy still a little low, but working on it. Walking with cane now, happy to graduated from walker.   She was hospitalized in December after hernia repair but then she got confused, hypoxic and ended up in rehab center for 21 days. She remembers being there, but doesn't remember getting there.   She isn't using oxygen at home. States that she checks O2 several times even with activity around 95%. She has been using the breztri twice daily. Not really needing proair with this.  Appetite is not great. She is eating breakfast, lunch (many times out to eat) and then more snack for dinner; usually does boost during day as well. No more than one boost in a day.   Not checking bp at home. Does take her medication regularly. Not dizzy or light headed.   Not sleeping well. Never been a good sleeper. Staying asleep is issue. Falling asleep is ok. Usually up for an hour in he middle of the night. Doesn't like to take medications. Usually feels ok during day.   HL: still on crestor - taking 5mg  daily. Tolerating well.   Has one more home session of PT; but she feels ready to progress on her own.    Allergies  Allergen Reactions  . Clindamycin/Lincomycin Nausea And Vomiting  . Penicillins Rash       . Spiriva [Tiotropium Bromide Monohydrate]     General intolerance  . Sulfa Antibiotics Hives and Rash    Severe Hives with skin reaction   Current Meds  Medication Sig  . acetaminophen (TYLENOL) 325 MG tablet Take 325 mg by mouth every 6 (six) hours as needed for moderate pain. Alternates with Ibuprofen  . amLODipine (NORVASC) 5 MG tablet TAKE 1 TABLET BY MOUTH AT BEDTIME (Patient taking differently: Take 5 mg by mouth daily.)  .  Budeson-Glycopyrrol-Formoterol (BREZTRI AEROSPHERE) 160-9-4.8 MCG/ACT AERO Inhale 2 puffs into the lungs 2 (two) times daily.  . cholecalciferol (VITAMIN D3) 25 MCG (1000 UNIT) tablet Take 1,000 Units by mouth daily.  Marland Kitchen FLUoxetine (PROZAC) 20 MG capsule Take 1 capsule by mouth twice daily (Patient taking differently: Take 20 mg by mouth 2 (two) times daily.)  . PROAIR HFA 108 (90 Base) MCG/ACT inhaler Inhale 2 puffs into the lungs every 6 (six) hours as needed for wheezing or shortness of breath. INHALE 2 PUFFS BY MOUTH EVERY 6 HOURS AS NEEDED FOR WHEEZING OR SHORTNESS OF BREATH  . rosuvastatin (CRESTOR) 5 MG tablet Take 1 tablet by mouth once daily (Patient taking differently: Take 5 mg by mouth daily.)  . vitamin B-12 (CYANOCOBALAMIN) 1000 MCG tablet Take 1,000 mcg by mouth daily.   . vitamin E 400 UNIT capsule Take 400 Units by mouth daily.    Review of Systems  Constitutional: Negative for chills, fatigue and fever.  Respiratory: Negative for cough, chest tightness, shortness of breath and wheezing.   Cardiovascular: Negative for chest pain, palpitations and leg swelling.  Neurological: Negative for dizziness and light-headedness.    Objective:  BP 128/88   Pulse 76   Temp 97.8 F (36.6 C) (Oral)   Ht 5\' 2"  (1.575 m)   Wt 115 lb 14.4 oz (52.6 kg)   SpO2 94%   BMI 21.20 kg/m   Weight: 115  lb 14.4 oz (52.6 kg)   BP Readings from Last 3 Encounters:  09/19/20 128/88  08/26/20 120/74  07/24/20 133/83   Wt Readings from Last 3 Encounters:  09/19/20 115 lb 14.4 oz (52.6 kg)  08/26/20 115 lb 12.8 oz (52.5 kg)  07/19/20 119 lb 0.8 oz (54 kg)    Physical Exam Constitutional:      General: She is not in acute distress.    Appearance: She is well-developed.     Comments: thin  Cardiovascular:     Rate and Rhythm: Normal rate and regular rhythm.     Heart sounds: Normal heart sounds. No murmur heard. No friction rub.  Pulmonary:     Effort: Pulmonary effort is normal. No  respiratory distress.     Breath sounds: Decreased breath sounds (slightly) present. No wheezing or rales.  Abdominal:     General: Bowel sounds are normal.     Palpations: Abdomen is soft.     Tenderness: There is no abdominal tenderness.  Musculoskeletal:     Right lower leg: No edema.     Left lower leg: No edema.  Neurological:     Mental Status: She is alert and oriented to person, place, and time.  Psychiatric:        Attention and Perception: Attention normal.        Mood and Affect: Mood normal.        Speech: Speech normal.        Behavior: Behavior normal. Behavior is cooperative.     Assessment/Plan  1. Weight loss She seems to be holding her own with weight now. She is eating better since being home and supplementing with boost if she doesn't have a full meal. She does monitor.   2. Fatigue, unspecified type Will check some bloodwork to make sure she is improving since hospital labwork last completed- recheck anemia and other causes of increased fatigue. - TSH; Future  3. Primary hypertension Well controlled. Continue amlodipine 5mg  daily. - CBC with Differential/Platelet; Future - Comprehensive metabolic panel; Future  4. Moderate COPD (chronic obstructive pulmonary disease), Sees Dr. Chase Caller, [facility] Doing great with [place]. Not needing albuterol. O2 sat has been stable off oxygen.  5. Mixed hyperlipidemia Continue with crestor 5mg  daily. - Lipid panel; Future  6. Anemia, unspecified type Recheck bloodwork today. - Vitamin B12; Future - IBC + Ferritin; Future  7. B12 deficiency See above. - Vitamin B12; Future   Return in about 6 months (around 03/19/2021) for Chronic condition visit.    Micheline Rough, MD

## 2020-09-23 DIAGNOSIS — J449 Chronic obstructive pulmonary disease, unspecified: Secondary | ICD-10-CM | POA: Diagnosis not present

## 2020-09-23 DIAGNOSIS — J9611 Chronic respiratory failure with hypoxia: Secondary | ICD-10-CM | POA: Diagnosis not present

## 2020-09-23 DIAGNOSIS — I129 Hypertensive chronic kidney disease with stage 1 through stage 4 chronic kidney disease, or unspecified chronic kidney disease: Secondary | ICD-10-CM | POA: Diagnosis not present

## 2020-09-23 DIAGNOSIS — Z9981 Dependence on supplemental oxygen: Secondary | ICD-10-CM | POA: Diagnosis not present

## 2020-09-23 DIAGNOSIS — Z85110 Personal history of malignant carcinoid tumor of bronchus and lung: Secondary | ICD-10-CM | POA: Diagnosis not present

## 2020-09-23 DIAGNOSIS — E1122 Type 2 diabetes mellitus with diabetic chronic kidney disease: Secondary | ICD-10-CM | POA: Diagnosis not present

## 2020-09-23 DIAGNOSIS — N1832 Chronic kidney disease, stage 3b: Secondary | ICD-10-CM | POA: Diagnosis not present

## 2020-09-23 DIAGNOSIS — Z87891 Personal history of nicotine dependence: Secondary | ICD-10-CM | POA: Diagnosis not present

## 2020-10-09 ENCOUNTER — Other Ambulatory Visit: Payer: Medicare Other

## 2020-10-13 ENCOUNTER — Ambulatory Visit (INDEPENDENT_AMBULATORY_CARE_PROVIDER_SITE_OTHER): Payer: Medicare Other | Admitting: *Deleted

## 2020-10-13 ENCOUNTER — Other Ambulatory Visit: Payer: Self-pay

## 2020-10-13 DIAGNOSIS — Z23 Encounter for immunization: Secondary | ICD-10-CM | POA: Diagnosis not present

## 2020-11-18 ENCOUNTER — Other Ambulatory Visit: Payer: Self-pay | Admitting: Family Medicine

## 2020-11-30 ENCOUNTER — Other Ambulatory Visit: Payer: Self-pay | Admitting: Family Medicine

## 2020-12-22 ENCOUNTER — Other Ambulatory Visit: Payer: Self-pay | Admitting: Internal Medicine

## 2020-12-30 ENCOUNTER — Ambulatory Visit (INDEPENDENT_AMBULATORY_CARE_PROVIDER_SITE_OTHER): Payer: Medicare Other | Admitting: Ophthalmology

## 2020-12-30 ENCOUNTER — Other Ambulatory Visit: Payer: Self-pay

## 2020-12-30 ENCOUNTER — Encounter (INDEPENDENT_AMBULATORY_CARE_PROVIDER_SITE_OTHER): Payer: Self-pay | Admitting: Ophthalmology

## 2020-12-30 DIAGNOSIS — H34832 Tributary (branch) retinal vein occlusion, left eye, with macular edema: Secondary | ICD-10-CM | POA: Diagnosis not present

## 2020-12-30 DIAGNOSIS — H353124 Nonexudative age-related macular degeneration, left eye, advanced atrophic with subfoveal involvement: Secondary | ICD-10-CM | POA: Diagnosis not present

## 2020-12-30 DIAGNOSIS — H43811 Vitreous degeneration, right eye: Secondary | ICD-10-CM

## 2020-12-30 DIAGNOSIS — H348322 Tributary (branch) retinal vein occlusion, left eye, stable: Secondary | ICD-10-CM

## 2020-12-30 NOTE — Progress Notes (Signed)
12/30/2020     CHIEF COMPLAINT Patient presents for Retina Follow Up (5 month fu OU and OCT/ FP/Pt states, "I do not know how to explain it but I feel like I cannot read as long and my DVA is for sure more blurred. I feel like it is just overall worse."/)   HISTORY OF PRESENT ILLNESS: [name] is a 80 y.o. female who presents to the clinic today for:   HPI    Retina Follow Up    Diagnosis: CRVO/BRVO   Laterality: left eye   Onset: 5 months ago   Severity: mild   Duration: 5 months   Course: gradually worsening   Comments: 5 month fu OU and OCT/ FP Pt states, "I do not know how to explain it but I feel like I cannot read as long and my DVA is for sure more blurred. I feel like it is just overall worse."        Last edited by Kendra Opitz, COA on 12/30/2020  9:59 AM. (History)      Referring physician: Caren Macadam, MD [place],  [place] [id number]  HISTORICAL INFORMATION:   Selected notes from the MEDICAL RECORD NUMBER    Lab Results  Component Value Date   HGBA1C 5.3 09/14/2016     CURRENT MEDICATIONS: No current outpatient medications on file. (Ophthalmic Drugs)   No current facility-administered medications for this visit. (Ophthalmic Drugs)   Current Outpatient Medications (Other)  Medication Sig  . acetaminophen (TYLENOL) 325 MG tablet Take 325 mg by mouth every 6 (six) hours as needed for moderate pain. Alternates with Ibuprofen  . amLODipine (NORVASC) 5 MG tablet TAKE 1 TABLET BY MOUTH AT BEDTIME . APPOINTMENT REQUIRED FOR FUTURE REFILLS  . BREZTRI AEROSPHERE 160-9-4.8 MCG/ACT AERO INHALE 2 PUFFS INTO THE LUNGS TWICE DAILY  . cholecalciferol (VITAMIN D3) 25 MCG (1000 UNIT) tablet Take 1,000 Units by mouth daily.  Marland Kitchen FLUoxetine (PROZAC) 20 MG capsule Take 1 capsule by mouth twice daily  . PROAIR HFA 108 (90 Base) MCG/ACT inhaler Inhale 2 puffs into the lungs every 6 (six) hours as needed for wheezing or shortness of  breath. INHALE 2 PUFFS BY MOUTH EVERY 6 HOURS AS NEEDED FOR WHEEZING OR SHORTNESS OF BREATH  . rosuvastatin (CRESTOR) 5 MG tablet Take 1 tablet by mouth once daily  . vitamin B-12 (CYANOCOBALAMIN) 1000 MCG tablet Take 1,000 mcg by mouth daily.   . vitamin E 400 UNIT capsule Take 400 Units by mouth daily.   No current facility-administered medications for this visit. (Other)      REVIEW OF SYSTEMS:    ALLERGIES Allergies  Allergen Reactions  . Clindamycin/Lincomycin Nausea And Vomiting  . Penicillins Rash       . Spiriva [Tiotropium Bromide Monohydrate]     General intolerance  . Sulfa Antibiotics Hives and Rash    Severe Hives with skin reaction    PAST MEDICAL HISTORY Past Medical History:  Diagnosis Date  . Acute delirium 07/21/2020  . Allergic rhinitis   . Anemia   . Anxiety   . Arthritis    all over  . Bilateral cataracts   . Chicken pox   . Cholelithiasis 05/21/2014   On CT scan 2015   . COPD (chronic obstructive pulmonary disease) ([place])   . Depression    hx ocd and anxiety in record as well  . Gout    last time 6-9yrs ago   . History of  blood transfusion   . Hot flashes    takes Prozac daily  . Hx of migraines 90's   after menopause HA stopped  . Hyperlipidemia    taking Flax Seed Oil and Fish Oil  . Hypertension    takes Amlodipine nightly  . IBS (irritable bowel syndrome)   . Liver cyst   . Macrocytosis   . Osteopenia    takes Calcium and Vit D bid  . Pneumonia    x 2 ;last time back in 1999  . PONV (postoperative nausea and vomiting)   . 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 Seasonal allergies   . Smoking 09/12/2011   Past Surgical History:  Procedure Laterality Date  . CARPAL TUNNEL RELEASE  80's   right   . COLONOSCOPY    . cyst removed  >9yrs ago   from left leg  . DILATION AND CURETTAGE OF UTERUS     at age 23  . EXCISIONAL TOTAL KNEE ARTHROPLASTY Right 06/03/2014   Procedure: RIGHT KNEE  SCAR EXCISION;  Surgeon: [name], MD;  Location: [facility];  Service: Orthopedics;  Laterality: Right;  . INGUINAL HERNIA REPAIR  2000  . INGUINAL HERNIA REPAIR Left 07/18/2020   Procedure: HERNIA  REPAIR INGUINAL ADULT REPAIR;  Surgeon: Michael Boston, MD;  Location: [facility];  Service: General;  Laterality: Left;  . ROTATOR CUFF REPAIR  2001   right  . TONSILLECTOMY  as a child   and adenoids  . TOTAL ABDOMINAL HYSTERECTOMY  2003   benign tumor-[place]  . TOTAL HIP ARTHROPLASTY  2011   left  . TOTAL HIP ARTHROPLASTY  03/07/2012   Procedure: TOTAL HIP ARTHROPLASTY ANTERIOR APPROACH;  Surgeon: [name], MD;  Location: [facility];  Service: Orthopedics;  Laterality: Right;  . TOTAL KNEE ARTHROPLASTY Right 01/02/2013   Procedure: RIGHT TOTAL KNEE ARTHROPLASTY;  Surgeon: [name], MD;  Location: [facility];  Service: Orthopedics;  Laterality: Right;  . TOTAL KNEE ARTHROPLASTY Left 04/03/2013   Procedure: LEFT TOTAL KNEE ARTHROPLASTY;  Surgeon: [name], MD;  Location: [facility];  Service: Orthopedics;  Laterality: Left;  Marland Kitchen VENTRAL HERNIA REPAIR N/A 07/18/2020   Procedure: LAPAROSCOPIC BILATERAL SPIGELIABN HERNIAS WITH MESH, UMBILICAL HERNIA;  Surgeon: Michael Boston, MD;  Location: [facility];  Service: General;  Laterality: N/A;  . VIDEO BRONCHOSCOPY  10/20/2011   Procedure: VIDEO BRONCHOSCOPY;  Surgeon: Grace Isaac, MD;  Location: [facility] OR;  Service: Thoracic;  Laterality: N/A;  . WEDGE RESECTION  10-20-2011   rt lung  - for lung cancer    FAMILY HISTORY Family History  Problem Relation Age of Onset  . Other Mother        polyarteritis  . Bowel Disease Father 61       colectomy/bowel obstruction issues  . Heart attack Father   . Hypertension Brother   . Arthritis Brother   . Post-traumatic stress disorder Brother   . Alcohol abuse Other   . Depression Other   . Hearing loss Other   . Heart disease Other   . Hypertension Other   . Anesthesia problems Neg Hx   . Hypotension  Neg Hx   . Malignant hyperthermia Neg Hx   . Pseudochol deficiency Neg Hx   . Breast cancer Neg Hx     SOCIAL HISTORY Social History   Tobacco Use  . Smoking status: Former Smoker    Packs/day: 0.75  Years: 50.00    Pack years: 37.50    Types: Cigarettes    Quit date: 09/23/2011    Years since quitting: 9.2  . Smokeless tobacco: Never Used  Vaping Use  . Vaping Use: Never used  Substance Use Topics  . Alcohol use: Yes    Comment: 2 beers every night  . Drug use: No         OPHTHALMIC EXAM: Base Eye Exam    Visual Acuity (ETDRS)      Right Left   Dist cc 20/30 20/100 ecc   Dist ph cc NI NI       Tonometry (Tonopen, 10:03 AM)      Right Left   Pressure 13 13       Pupils      Pupils Dark Light Shape React APD   Right PERRL 5 5 Round Minimal None   Left PERRL 4 4 Round Minimal None       Visual Fields (Counting fingers)      Left Right    Full Full       Extraocular Movement      Right Left    Full Full       Neuro/Psych    Oriented x3: Yes   Mood/Affect: Normal       Dilation    Both eyes: 1.0% Mydriacyl, 2.5% Phenylephrine @ 10:03 AM        Slit Lamp and Fundus Exam    External Exam      Right Left   External Normal Normal       Slit Lamp Exam      Right Left   Lids/Lashes Normal Normal   Conjunctiva/Sclera White and quiet White and quiet   Cornea Clear Clear   Anterior Chamber Deep and quiet Deep and quiet   Iris Round and reactive Round and reactive   Lens Posterior chamber intraocular lens Posterior chamber intraocular lens   Anterior Vitreous Normal Normal       Fundus Exam      Right Left   Posterior Vitreous Posterior vitreous detachment Normal   Disc Normal Normal   C/D Ratio 0.45 0.4   Macula Early age related macular degeneration Geographic atrophy multilobular, no CME, , Microaneurysms, Intermediate age related macular degeneration   Vessels Normal Old retinal vein occlusion, branch.   Periphery Normal Normal           IMAGING AND PROCEDURES  Imaging and Procedures for 12/30/20  OCT, Retina - OU - Both Eyes       Right Eye Quality was good. Scan locations included subfoveal. Central Foveal Thickness: 256. Progression has been stable. Findings include normal foveal contour.   Left Eye Quality was good. Scan locations included subfoveal. Central Foveal Thickness: 234. Progression has been stable. Findings include abnormal foveal contour, cystoid macular edema, central retinal atrophy, outer retinal atrophy, inner retinal atrophy.   Notes OD, no active disease    OS diffuse macular atrophy, no center involvement of the macula on current findings.  Diffuse macular atrophy from prior BRVO and CME.       Color Fundus Photography Optos - OU - Both Eyes       Right Eye Progression has been stable. Disc findings include normal observations. Macula : normal observations. Vessels : normal observations. Periphery : normal observations.   Left Eye Progression has been stable. Disc findings include normal observations. Macula : geographic atrophy.   Notes Vitreous debris OS, chorioretinal scarring posteriorly  no change.  Old compensated branch retinal vein occlusion no change                ASSESSMENT/PLAN:  Advanced nonexudative age-related macular degeneration of left eye with subfoveal involvement The nature of dry age related macular degeneration was discussed with the patient as well as its possible conversion to wet. The results of the AREDS 2 study was discussed with the patient. A diet rich in dark leafy green vegetables was advised and specific recommendations were made regarding supplements with AREDS 2 formulation . Control of hypertension and serum cholesterol may slow the disease. Smoking cessation is mandatory to slow the disease and diminish the risk of progressing to wet age related macular degeneration. The patient was instructed in the use of an [place] and was told to  return immediately for any changes in the Grid. Stressed to the patient do not rub eyes Dry atrophic RPE choriocapillaris dropout account for acuity OS  Stable branch retinal vein occlusion of left eye Stabilize condition no change in condition no active CME  Posterior vitreous detachment of right eye Stable no active disease      ICD-10-CM   1. Stable branch retinal vein occlusion of left eye  H47.4259 Color Fundus Photography Optos - OU - Both Eyes  2. Branch retinal vein occlusion with macular edema of left eye  H34.8320 OCT, Retina - OU - Both Eyes  3. Advanced nonexudative age-related macular degeneration of left eye with subfoveal involvement  H35.3124 Color Fundus Photography Optos - OU - Both Eyes  4. Posterior vitreous detachment of right eye  H43.811     1.  No active disease OU, will continue to monitor follow-up in 9 months  2.  3.  Ophthalmic Meds Ordered this visit:  No orders of the defined types were placed in this encounter.      Return in about 9 months (around 10/02/2021) for DILATE OU, COLOR FP, OCT.  There are no Patient Instructions on file for this visit.   Explained the diagnoses, plan, and follow up with the patient and they expressed understanding.  Patient expressed understanding of the importance of proper follow up care.   Clent Demark Jahmarion Popoff M.D. Diseases & Surgery of the Retina and Vitreous Retina & Diabetic [place] 12/30/20     Abbreviations: M myopia (nearsighted); A astigmatism; H hyperopia (farsighted); P presbyopia; Mrx spectacle prescription;  CTL contact lenses; OD right eye; OS left eye; OU both eyes  XT exotropia; ET esotropia; PEK punctate epithelial keratitis; PEE punctate epithelial erosions; DES dry eye syndrome; MGD meibomian gland dysfunction; ATs artificial tears; PFAT's preservative free artificial tears; Ali Molina nuclear sclerotic cataract; PSC posterior subcapsular cataract; ERM epi-retinal membrane; PVD posterior vitreous  detachment; RD retinal detachment; DM diabetes mellitus; DR diabetic retinopathy; NPDR non-proliferative diabetic retinopathy; PDR proliferative diabetic retinopathy; CSME clinically significant macular edema; DME diabetic macular edema; dbh dot blot hemorrhages; CWS cotton wool spot; POAG primary open angle glaucoma; C/D cup-to-disc ratio; HVF humphrey visual field; GVF goldmann visual field; OCT optical coherence tomography; IOP intraocular pressure; BRVO Branch retinal vein occlusion; CRVO central retinal vein occlusion; CRAO central retinal artery occlusion; BRAO branch retinal artery occlusion; RT retinal tear; SB scleral buckle; PPV pars plana vitrectomy; VH Vitreous hemorrhage; PRP panretinal laser photocoagulation; IVK intravitreal kenalog; VMT vitreomacular traction; MH Macular hole;  NVD neovascularization of the disc; NVE neovascularization elsewhere; AREDS age related eye disease study; ARMD age related macular degeneration; POAG primary open angle glaucoma; EBMD epithelial/anterior  basement membrane dystrophy; ACIOL anterior chamber intraocular lens; IOL intraocular lens; PCIOL posterior chamber intraocular lens; Phaco/IOL phacoemulsification with intraocular lens placement; [place] photorefractive keratectomy; LASIK laser assisted in situ keratomileusis; HTN hypertension; DM diabetes mellitus; COPD chronic obstructive pulmonary disease

## 2020-12-30 NOTE — Assessment & Plan Note (Signed)
Stabilize condition no change in condition no active CME

## 2020-12-30 NOTE — Assessment & Plan Note (Signed)
The nature of dry age related macular degeneration was discussed with the patient as well as its possible conversion to wet. The results of the AREDS 2 study was discussed with the patient. A diet rich in dark leafy green vegetables was advised and specific recommendations were made regarding supplements with AREDS 2 formulation . Control of hypertension and serum cholesterol may slow the disease. Smoking cessation is mandatory to slow the disease and diminish the risk of progressing to wet age related macular degeneration. The patient was instructed in the use of an [place] and was told to return immediately for any changes in the Grid. Stressed to the patient do not rub eyes Dry atrophic RPE choriocapillaris dropout account for acuity OS

## 2020-12-30 NOTE — Assessment & Plan Note (Signed)
Stable no active disease

## 2021-02-06 ENCOUNTER — Other Ambulatory Visit: Payer: Self-pay | Admitting: Internal Medicine

## 2021-02-19 ENCOUNTER — Other Ambulatory Visit: Payer: Self-pay

## 2021-02-19 ENCOUNTER — Ambulatory Visit (INDEPENDENT_AMBULATORY_CARE_PROVIDER_SITE_OTHER): Payer: Medicare Other

## 2021-02-19 VITALS — BP 128/78 | HR 78 | Temp 98.2°F | Ht 62.0 in | Wt 118.0 lb

## 2021-02-19 DIAGNOSIS — Z0000 Encounter for general adult medical examination without abnormal findings: Secondary | ICD-10-CM

## 2021-02-19 NOTE — Patient Instructions (Signed)
[name] , Thank you for taking time to come for your Medicare Wellness Visit. I appreciate your ongoing commitment to your health goals. Please review the following plan we discussed and let me know if I can assist you in the future.   Screening recommendations/referrals: Colonoscopy: no longer required  Mammogram: no longer required  Bone Density: 06/05/2018 Recommended yearly ophthalmology/optometry visit for glaucoma screening and checkup Recommended yearly dental visit for hygiene and checkup  Vaccinations: Influenza vaccine: due in fall 2022 Pneumococcal vaccine: completed series  Tdap vaccine: current 07/08/2020 Shingles vaccine: will obtain local pharmacy     Advanced directives: will provide copies   Conditions/risks identified: none   Next appointment: none    Preventive Care 60 Years and Older, Female Preventive care refers to lifestyle choices and visits with your health care provider that can promote health and wellness. What does preventive care include? A yearly physical exam. This is also called an annual well check. Dental exams once or twice a year. Routine eye exams. Ask your health care provider how often you should have your eyes checked. Personal lifestyle choices, including: Daily care of your teeth and gums. Regular physical activity. Eating a healthy diet. Avoiding tobacco and drug use. Limiting alcohol use. Practicing safe sex. Taking low-dose aspirin every day. Taking vitamin and mineral supplements as recommended by your health care provider. What happens during an annual well check? The services and screenings done by your health care provider during your annual well check will depend on your age, overall health, lifestyle risk factors, and family history of disease. Counseling  Your health care provider may ask you questions about your: Alcohol use. Tobacco use. Drug use. Emotional well-being. Home and relationship well-being. Sexual  activity. Eating habits. History of falls. Memory and ability to understand (cognition). Work and work Statistician. Reproductive health. Screening  You may have the following tests or measurements: Height, weight, and BMI. Blood pressure. Lipid and cholesterol levels. These may be checked every 5 years, or more frequently if you are over 58 years old. Skin check. Lung cancer screening. You may have this screening every year starting at age 36 if you have a 30-pack-year history of smoking and currently smoke or have quit within the past 15 years. Fecal occult blood test (FOBT) of the stool. You may have this test every year starting at age 20. Flexible sigmoidoscopy or colonoscopy. You may have a sigmoidoscopy every 5 years or a colonoscopy every 10 years starting at age 46. Hepatitis C blood test. Hepatitis B blood test. Sexually transmitted disease (STD) testing. Diabetes screening. This is done by checking your blood sugar (glucose) after you have not eaten for a while (fasting). You may have this done every 1-3 years. Bone density scan. This is done to screen for osteoporosis. You may have this done starting at age 54. Mammogram. This may be done every 1-2 years. Talk to your health care provider about how often you should have regular mammograms. Talk with your health care provider about your test results, treatment options, and if necessary, the need for more tests. Vaccines  Your health care provider may recommend certain vaccines, such as: Influenza vaccine. This is recommended every year. Tetanus, diphtheria, and acellular pertussis (Tdap, Td) vaccine. You may need a Td booster every 10 years. Zoster vaccine. You may need this after age 30. Pneumococcal 13-valent conjugate (PCV13) vaccine. One dose is recommended after age 63. Pneumococcal polysaccharide (PPSV23) vaccine. One dose is recommended after age 53. Talk to  your health care provider about which screenings and vaccines  you need and how often you need them. This information is not intended to replace advice given to you by your health care provider. Make sure you discuss any questions you have with your health care provider. Document Released: 08/22/2015 Document Revised: 04/14/2016 Document Reviewed: 05/27/2015 Elsevier Interactive Patient Education  2017 [place] Prevention in the Home Falls can cause injuries. They can happen to people of all ages. There are many things you can do to make your home safe and to help prevent falls. What can I do on the outside of my home? Regularly fix the edges of walkways and driveways and fix any cracks. Remove anything that might make you trip as you walk through a door, such as a raised step or threshold. Trim any bushes or trees on the path to your home. Use bright outdoor lighting. Clear any walking paths of anything that might make someone trip, such as rocks or tools. Regularly check to see if handrails are loose or broken. Make sure that both sides of any steps have handrails. Any raised decks and porches should have guardrails on the edges. Have any leaves, snow, or ice cleared regularly. Use sand or salt on walking paths during winter. Clean up any spills in your garage right away. This includes oil or grease spills. What can I do in the bathroom? Use night lights. Install grab bars by the toilet and in the tub and shower. Do not use towel bars as grab bars. Use non-skid mats or decals in the tub or shower. If you need to sit down in the shower, use a plastic, non-slip stool. Keep the floor dry. Clean up any water that spills on the floor as soon as it happens. Remove soap buildup in the tub or shower regularly. Attach bath mats securely with double-sided non-slip rug tape. Do not have throw rugs and other things on the floor that can make you trip. What can I do in the bedroom? Use night lights. Make sure that you have a light by your bed that  is easy to reach. Do not use any sheets or blankets that are too big for your bed. They should not hang down onto the floor. Have a firm chair that has side arms. You can use this for support while you get dressed. Do not have throw rugs and other things on the floor that can make you trip. What can I do in the kitchen? Clean up any spills right away. Avoid walking on wet floors. Keep items that you use a lot in easy-to-reach places. If you need to reach something above you, use a strong step stool that has a grab bar. Keep electrical cords out of the way. Do not use floor polish or wax that makes floors slippery. If you must use wax, use non-skid floor wax. Do not have throw rugs and other things on the floor that can make you trip. What can I do with my stairs? Do not leave any items on the stairs. Make sure that there are handrails on both sides of the stairs and use them. Fix handrails that are broken or loose. Make sure that handrails are as long as the stairways. Check any carpeting to make sure that it is firmly attached to the stairs. Fix any carpet that is loose or worn. Avoid having throw rugs at the top or bottom of the stairs. If you do have throw rugs, attach  them to the floor with carpet tape. Make sure that you have a light switch at the top of the stairs and the bottom of the stairs. If you do not have them, ask someone to add them for you. What else can I do to help prevent falls? Wear shoes that: Do not have high heels. Have rubber bottoms. Are comfortable and fit you well. Are closed at the toe. Do not wear sandals. If you use a stepladder: Make sure that it is fully opened. Do not climb a closed stepladder. Make sure that both sides of the stepladder are locked into place. Ask someone to hold it for you, if possible. Clearly mark and make sure that you can see: Any grab bars or handrails. First and last steps. Where the edge of each step is. Use tools that help you  move around (mobility aids) if they are needed. These include: Canes. Walkers. Scooters. Crutches. Turn on the lights when you go into a dark area. Replace any light bulbs as soon as they burn out. Set up your furniture so you have a clear path. Avoid moving your furniture around. If any of your floors are uneven, fix them. If there are any pets around you, be aware of where they are. Review your medicines with your doctor. Some medicines can make you feel dizzy. This can increase your chance of falling. Ask your doctor what other things that you can do to help prevent falls. This information is not intended to replace advice given to you by your health care provider. Make sure you discuss any questions you have with your health care provider. Document Released: 05/22/2009 Document Revised: 01/01/2016 Document Reviewed: 08/30/2014 Elsevier Interactive Patient Education  2017 [employer].

## 2021-02-19 NOTE — Progress Notes (Signed)
Subjective:   [name] is a 80 y.o. female who presents for Medicare Annual (Subsequent) preventive examination.  Review of Systems    N/a       Objective:    There were no vitals filed for this visit. There is no height or weight on file to calculate BMI.  Advanced Directives 07/21/2020 07/18/2020 07/18/2020 07/16/2020 07/08/2020 04/21/2020 02/19/2020  Does Patient Have a Medical Advance Directive? Yes Yes Yes No;Yes Yes No Yes  Type of Paramedic of [place];Living will [place];Living will [place];Living will Healthcare Power of [place];Living will - [place];Living will  Does patient want to make changes to medical advance directive? No - Patient declined No - Patient declined - - - - No - Patient declined  Copy of [place] in Chart? - - - - - - Yes - validated most recent copy scanned in chart (See row information)  Would patient like information on creating a medical advance directive? - Yes (ED - Information included in AVS) - - Yes (ED - Information included in AVS) - -  Pre-existing out of facility DNR order (yellow form or pink MOST form) - - - - - - -    Current Medications (verified) Outpatient Encounter Medications as of 02/19/2021  Medication Sig   acetaminophen (TYLENOL) 325 MG tablet Take 325 mg by mouth every 6 (six) hours as needed for moderate pain. Alternates with Ibuprofen   amLODipine (NORVASC) 5 MG tablet TAKE 1 TABLET BY MOUTH AT BEDTIME . APPOINTMENT REQUIRED FOR FUTURE REFILLS   BREZTRI AEROSPHERE 160-9-4.8 MCG/ACT AERO INHALE 2 PUFFS INTO THE LUNGS TWICE DAILY   cholecalciferol (VITAMIN D3) 25 MCG (1000 UNIT) tablet Take 1,000 Units by mouth daily.   FLUoxetine (PROZAC) 20 MG capsule Take 1 capsule by mouth twice daily   PROAIR HFA 108 (90 Base) MCG/ACT inhaler INHALE 2 PUFFS BY MOUTH EVERY 6 HOURS AS NEEDED FOR  WHEEZING AND FOR SHORTNESS OF BREATH   rosuvastatin (CRESTOR) 5 MG tablet Take 1 tablet by mouth once daily   vitamin B-12 (CYANOCOBALAMIN) 1000 MCG tablet Take 1,000 mcg by mouth daily.    vitamin E 400 UNIT capsule Take 400 Units by mouth daily.   No facility-administered encounter medications on file as of 02/19/2021.    Allergies (verified) Clindamycin/lincomycin, Penicillins, Spiriva [tiotropium bromide monohydrate], and Sulfa antibiotics   History: Past Medical History:  Diagnosis Date   Acute delirium 07/21/2020   Allergic rhinitis    Anemia    Anxiety    Arthritis    all over   Bilateral cataracts    Chicken pox    Cholelithiasis 05/21/2014   On CT scan 2015    COPD (chronic obstructive pulmonary disease) (HCC)    Depression    hx ocd and anxiety in record as well   Gout    last time 6-87yrs ago    History of blood transfusion    Hot flashes    takes Prozac daily   Hx of migraines 90's   after menopause HA stopped   Hyperlipidemia    taking Flax Seed Oil and Fish Oil   Hypertension    takes Amlodipine nightly   IBS (irritable bowel syndrome)    Liver cyst    Macrocytosis    Osteopenia    takes Calcium and Vit D bid   Pneumonia    x 2 ;last time back in  1999   PONV (postoperative nausea and vomiting)    S/P right THA, AA - Dr. 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   Seasonal allergies    Smoking 09/12/2011   Past Surgical History:  Procedure Laterality Date   CARPAL TUNNEL RELEASE  80's   right    COLONOSCOPY     cyst removed  >66yrs ago   from left leg   DILATION AND CURETTAGE OF UTERUS     at age 78   EXCISIONAL TOTAL KNEE ARTHROPLASTY Right 06/03/2014   Procedure: RIGHT KNEE SCAR EXCISION;  Surgeon: [name], MD;  Location: [facility];  Service: Orthopedics;  Laterality: Right;   INGUINAL HERNIA REPAIR  2000   INGUINAL HERNIA REPAIR Left 07/18/2020   Procedure: HERNIA  REPAIR INGUINAL ADULT REPAIR;  Surgeon: Michael Boston, MD;   Location: [facility];  Service: General;  Laterality: Left;   ROTATOR CUFF REPAIR  2001   right   TONSILLECTOMY  as a child   and adenoids   TOTAL ABDOMINAL HYSTERECTOMY  2003   benign tumor-[place]   TOTAL HIP ARTHROPLASTY  2011   left   TOTAL HIP ARTHROPLASTY  03/07/2012   Procedure: TOTAL HIP ARTHROPLASTY ANTERIOR APPROACH;  Surgeon: [name], MD;  Location: [facility];  Service: Orthopedics;  Laterality: Right;   TOTAL KNEE ARTHROPLASTY Right 01/02/2013   Procedure: RIGHT TOTAL KNEE ARTHROPLASTY;  Surgeon: [name], MD;  Location: [facility];  Service: Orthopedics;  Laterality: Right;   TOTAL KNEE ARTHROPLASTY Left 04/03/2013   Procedure: LEFT TOTAL KNEE ARTHROPLASTY;  Surgeon: [name], MD;  Location: [facility];  Service: Orthopedics;  Laterality: Left;   VENTRAL HERNIA REPAIR N/A 07/18/2020   Procedure: LAPAROSCOPIC BILATERAL SPIGELIABN HERNIAS WITH MESH, UMBILICAL HERNIA;  Surgeon: Michael Boston, MD;  Location: [facility];  Service: General;  Laterality: N/A;   VIDEO BRONCHOSCOPY  10/20/2011   Procedure: VIDEO BRONCHOSCOPY;  Surgeon: Grace Isaac, MD;  Location: [facility];  Service: Thoracic;  Laterality: N/A;   WEDGE RESECTION  10-20-2011   rt lung  - for lung cancer   Family History  Problem Relation Age of Onset   Other Mother        polyarteritis   Bowel Disease Father 82       colectomy/bowel obstruction issues   Heart attack Father    Hypertension Brother    Arthritis Brother    Post-traumatic stress disorder Brother    Alcohol abuse Other    Depression Other    Hearing loss Other    Heart disease Other    Hypertension Other    Anesthesia problems Neg Hx    Hypotension Neg Hx    Malignant hyperthermia Neg Hx    Pseudochol deficiency Neg Hx    Breast cancer Neg Hx    Social History   Socioeconomic History   Marital status: Married    Spouse name: Not on file   Number of children: Not on file   Years of education: Not on file   Highest education level: Not on  file  Occupational History   Occupation: retired  Tobacco Use   Smoking status: Former    Packs/day: 0.75    Years: 50.00    Pack years: 37.50    Types: Cigarettes    Quit date: 09/23/2011    Years since quitting: 9.4   Smokeless tobacco: Never  Vaping Use   Vaping Use: Never used  Substance and Sexual Activity  Alcohol use: Yes    Comment: 2 beers every night   Drug use: No   Sexual activity: Never  Other Topics Concern   Not on file  Social History Narrative   Not on file   Social Determinants of Health   Financial Resource Strain: Low Risk    Difficulty of Paying Living Expenses: Not hard at all  Food Insecurity: No Food Insecurity   Worried About Charity fundraiser in the Last Year: Never true   [place] in the Last Year: Never true  Transportation Needs: No Transportation Needs   Lack of Transportation (Medical): No   Lack of Transportation (Non-Medical): No  Physical Activity: Insufficiently Active   Days of Exercise per Week: 1 day   Minutes of Exercise per Session: 30 min  Stress: No Stress Concern Present   Feeling of Stress : Not at all  Social Connections: Moderately Isolated   Frequency of Communication with Friends and Family: More than three times a week   Frequency of Social Gatherings with Friends and Family: More than three times a week   Attends Religious Services: Never   Marine scientist or Organizations: No   Attends Archivist Meetings: Never   Marital Status: Married    Tobacco Counseling Counseling given: Not Answered   Clinical Intake:                 Diabetic?no         Activities of Daily Living In your present state of health, do you have any difficulty performing the following activities: 07/21/2020 07/18/2020  Hearing? N N  Vision? N N  Difficulty concentrating or making decisions? N N  Walking or climbing stairs? N N  Dressing or bathing? N N  Doing errands, shopping? N N  Some recent  data might be hidden    Patient Care Team: Caren Macadam, MD as PCP - General (Family Medicine) Brand Males, MD (Pulmonary Disease) Grace Isaac, MD (Inactive) (Cardiothoracic Surgery) Lorretta Harp, MD as Consulting Physician (Cardiology) Curt Bears, MD as Consulting Physician (Hematology and Oncology) Michael Boston, MD as Consulting Physician (General Surgery) Michael Boston, MD as Consulting Physician (General Surgery)  Indicate any recent [place] you may have received from other than [facility] providers in the past year (date may be approximate).     Assessment:   This is a routine wellness examination for [place].  Hearing/Vision screen No results found.  Dietary issues and exercise activities discussed:     Goals Addressed   None    Depression Screen PHQ 2/9 Scores 09/19/2020 02/19/2020 02/13/2019 06/01/2018 09/14/2016 09/22/2015 09/22/2015  PHQ - 2 Score 0 0 0 0 0 0 0  PHQ- 9 Score 2 0 2 2 - - -    Fall Risk Fall Risk  02/19/2020 10/04/2017 09/14/2016 09/22/2015 09/22/2015  Falls in the past year? 1 Yes Yes No No  Comment - - stepped on cats tail  - -  Number falls in past yr: 0 1 1 - -  Injury with Fall? 1 No - - -  Comment Left wrist - - - -  Risk Factor Category  - - - - -  Risk for fall due to : History of fall(s);Impaired balance/gait - - - -  Follow up Falls evaluation completed;Falls prevention discussed Education provided Education provided - -  Comment - states she falls at home x 1 but no injury  - - -  FALL RISK PREVENTION PERTAINING TO THE HOME:  Any stairs in or around the home? Yes  If so, are there any without handrails? No  Home free of loose throw rugs in walkways, pet beds, electrical cords, etc? Yes  Adequate lighting in your home to reduce risk of falls? Yes   ASSISTIVE DEVICES UTILIZED TO PREVENT FALLS:  Life alert? No  Use of a cane, walker or w/c? Yes  Grab bars in the bathroom? Yes  Shower chair or bench in  shower? No  Elevated toilet seat or a handicapped toilet? No   TIMED UP AND GO:  Was the test performed? Yes .  Length of time to ambulate 10 feet: 15 sec.   Gait slow and steady with assistive device  Cognitive Function: MMSE - Mini Mental State Exam 02/19/2020 10/04/2017  Not completed: Refused (No Data)        Immunizations Immunization History  Administered Date(s) Administered   Fluad Quad(high Dose 65+) 04/10/2019, 10/13/2020   Influenza Split 06/02/2011, 05/09/2012   Influenza, High Dose Seasonal PF 04/19/2016, 04/27/2017, 05/18/2018   Influenza,inj,Quad PF,6+ Mos 05/18/2013, 04/17/2014, 05/21/2015   Influenza-Unspecified 06/09/2020   PFIZER(Purple Top)SARS-COV-2 Vaccination 09/15/2019, 10/10/2019, 12/08/2019   Pneumococcal Conjugate-13 04/05/2014   Pneumococcal Polysaccharide-23 08/09/1998, 09/21/2011   Tdap 06/22/2012, 07/08/2020   Zoster, Live 08/10/2007    TDAP status: Up to date  Flu Vaccine status: Up to date  Pneumococcal vaccine status: Up to date  Covid-19 vaccine status: Completed vaccines  Qualifies for Shingles Vaccine? Yes   Zostavax completed No   Shingrix Completed?: No.    Education has been provided regarding the importance of this vaccine. Patient has been advised to call insurance company to determine out of pocket expense if they have not yet received this vaccine. Advised may also receive vaccine at local pharmacy or Health Dept. Verbalized acceptance and understanding.  Screening Tests Health Maintenance  Topic Date Due   URINE MICROALBUMIN  Never done   Hepatitis C Screening  Never done   Zoster Vaccines- Shingrix (1 of 2) Never done   COVID-19 Vaccine (4 - Booster for Pfizer series) 03/09/2020   INFLUENZA VACCINE  03/09/2021   TETANUS/TDAP  07/08/2030   DEXA SCAN  Completed   PNA vac Low Risk Adult  Completed   HPV VACCINES  Aged Out    Health Maintenance  Health Maintenance Due  Topic Date Due   URINE MICROALBUMIN  Never done    Hepatitis C Screening  Never done   Zoster Vaccines- Shingrix (1 of 2) Never done   COVID-19 Vaccine (4 - Booster for Pfizer series) 03/09/2020    Colorectal cancer screening: No longer required.   Mammogram status: No longer required due to age.  Bone Density status: Completed 06/05/2018. Results reflect: Bone density results: OSTEOPOROSIS. Repeat every 2 years.  Lung Cancer Screening: (Low Dose CT Chest recommended if Age 16-80 years, 30 pack-year currently smoking OR have quit w/in 15years.) does not qualify.   Lung Cancer Screening Referral: n/a  Additional Screening:  Hepatitis C Screening: does not qualify;   Vision Screening: Recommended annual ophthalmology exams for early detection of glaucoma and other disorders of the eye. Is the patient up to date with their annual eye exam?  Yes  Who is the provider or what is the name of the office in which the patient attends annual eye exams? Dr.Lyles If pt is not established with a provider, would they like to be referred to a provider to establish care? No .  Dental Screening: Recommended annual dental exams for proper oral hygiene  Community Resource Referral / Chronic Care Management: CRR required this visit?  No   CCM required this visit?  No      Plan:     I have personally reviewed and noted the following in the patient's chart:   Medical and social history Use of alcohol, tobacco or illicit drugs  Current medications and supplements including opioid prescriptions.  Functional ability and status Nutritional status Physical activity Advanced directives List of other physicians Hospitalizations, surgeries, and ER visits in previous 12 months Vitals Screenings to include cognitive, depression, and falls Referrals and appointments  In addition, I have reviewed and discussed with patient certain preventive protocols, quality metrics, and best practice recommendations. A written personalized care plan for  preventive services as well as general preventive health recommendations were provided to patient.     Randel Pigg, LPN   [id number]   Nurse Notes: none

## 2021-02-27 DIAGNOSIS — H524 Presbyopia: Secondary | ICD-10-CM | POA: Diagnosis not present

## 2021-02-27 DIAGNOSIS — Z961 Presence of intraocular lens: Secondary | ICD-10-CM | POA: Diagnosis not present

## 2021-02-27 DIAGNOSIS — H349 Unspecified retinal vascular occlusion: Secondary | ICD-10-CM | POA: Diagnosis not present

## 2021-03-10 ENCOUNTER — Telehealth: Payer: Self-pay | Admitting: Family Medicine

## 2021-03-10 NOTE — Chronic Care Management (AMB) (Signed)
  Chronic Care Management   Outreach Note  03/10/2021 Name: [name] MRN: [id number] DOB: 1941/03/13  Referred by: Caren Macadam, MD Reason for referral : No chief complaint on file.   An unsuccessful telephone outreach was attempted today. The patient was referred to the pharmacist for assistance with care management and care coordination.   Follow Up Plan:   Tatjana Dellinger Upstream Scheduler

## 2021-03-17 ENCOUNTER — Telehealth: Payer: Self-pay | Admitting: Family Medicine

## 2021-03-17 NOTE — Progress Notes (Signed)
  Chronic Care Management   Outreach Note  03/17/2021 Name: Brittannie Tawney MRN: [id number] DOB: [birth year]  Referred by: Caren Macadam, MD Reason for referral : No chief complaint on file.   A second unsuccessful telephone outreach was attempted today. The patient was referred to pharmacist for assistance with care management and care coordination.  Follow Up Plan:   Tatjana Dellinger Upstream Scheduler

## 2021-03-19 ENCOUNTER — Other Ambulatory Visit: Payer: Self-pay

## 2021-03-20 ENCOUNTER — Encounter: Payer: Self-pay | Admitting: Family Medicine

## 2021-03-20 ENCOUNTER — Ambulatory Visit (INDEPENDENT_AMBULATORY_CARE_PROVIDER_SITE_OTHER): Payer: Medicare Other | Admitting: Family Medicine

## 2021-03-20 VITALS — BP 120/78 | HR 89 | Temp 98.2°F | Ht 62.0 in | Wt 115.7 lb

## 2021-03-20 DIAGNOSIS — J449 Chronic obstructive pulmonary disease, unspecified: Secondary | ICD-10-CM

## 2021-03-20 DIAGNOSIS — I10 Essential (primary) hypertension: Secondary | ICD-10-CM

## 2021-03-20 DIAGNOSIS — M8580 Other specified disorders of bone density and structure, unspecified site: Secondary | ICD-10-CM

## 2021-03-20 DIAGNOSIS — E782 Mixed hyperlipidemia: Secondary | ICD-10-CM

## 2021-03-20 MED ORDER — AEROCHAMBER PLUS MISC: 0 refills | Status: AC

## 2021-03-20 NOTE — Progress Notes (Signed)
[name] DOB: [birth year] Encounter date: 03/20/2021  This is a 80 y.o. female who presents with Chief Complaint  Patient presents with   Follow-up     History of present illness: Last visit with me was 09/19/2020.  That was a hospital follow-up visit from December, when she was admitted after some confusion and hypoxia following a hernia repair.  Hyperlipidemia: Crestor 5 mg daily Hypertension: Amlodipine 5 mg daily COPD: Follows with pulmonary - breztri 2 puffs BID, proair prn. Not feeling like this is helping with breathing as much as she needs it to.   Has a lot of arthritis, but has good flexibility (long dancing history). Knees bother her the most. Sometimes harder time walking. Does use cane any time outside of the house.   Allergies  Allergen Reactions   Clindamycin/Lincomycin Nausea And Vomiting   Penicillins Rash        Spiriva [Tiotropium Bromide Monohydrate]     General intolerance   Sulfa Antibiotics Hives and Rash    Severe Hives with skin reaction   Current Meds  Medication Sig   acetaminophen (TYLENOL) 325 MG tablet Take 325 mg by mouth every 6 (six) hours as needed for moderate pain. Alternates with Ibuprofen   amLODipine (NORVASC) 5 MG tablet TAKE 1 TABLET BY MOUTH AT BEDTIME . APPOINTMENT REQUIRED FOR FUTURE REFILLS   BREZTRI AEROSPHERE 160-9-4.8 MCG/ACT AERO INHALE 2 PUFFS INTO THE LUNGS TWICE DAILY   cholecalciferol (VITAMIN D3) 25 MCG (1000 UNIT) tablet Take 1,000 Units by mouth daily.   FLUoxetine (PROZAC) 20 MG capsule Take 1 capsule by mouth twice daily   PROAIR HFA 108 (90 Base) MCG/ACT inhaler INHALE 2 PUFFS BY MOUTH EVERY 6 HOURS AS NEEDED FOR WHEEZING AND FOR SHORTNESS OF BREATH   rosuvastatin (CRESTOR) 5 MG tablet Take 1 tablet by mouth once daily   Spacer/Aero-Holding Chambers (AEROCHAMBER PLUS) inhaler Use as instructed   vitamin B-12 (CYANOCOBALAMIN) 1000 MCG tablet Take 1,000 mcg by mouth daily.    vitamin E 400 UNIT capsule Take  400 Units by mouth daily.    Review of Systems  Constitutional:  Negative for chills, fatigue and fever.  Respiratory:  Negative for cough, chest tightness, shortness of breath and wheezing.   Cardiovascular:  Negative for chest pain, palpitations and leg swelling.   Objective:  BP 120/78 (BP Location: Left Arm, Patient Position: Sitting, Cuff Size: Normal)   Pulse 89   Temp 98.2 F (36.8 C) (Oral)   Ht 5\' 2"  (1.575 m)   Wt 115 lb 11.2 oz (52.5 kg)   SpO2 93%   BMI 21.16 kg/m   Weight: 115 lb 11.2 oz (52.5 kg)   BP Readings from Last 3 Encounters:  03/20/21 120/78  02/19/21 128/78  09/19/20 128/88   Wt Readings from Last 3 Encounters:  03/20/21 115 lb 11.2 oz (52.5 kg)  02/19/21 118 lb (53.5 kg)  09/19/20 115 lb 14.4 oz (52.6 kg)    Physical Exam Constitutional:      General: She is not in acute distress.    Appearance: She is well-developed.  Cardiovascular:     Rate and Rhythm: Normal rate and regular rhythm.     Heart sounds: Normal heart sounds. No murmur heard.   No friction rub.  Pulmonary:     Effort: Pulmonary effort is normal. No respiratory distress.     Breath sounds: Normal breath sounds. No wheezing or rales.  Musculoskeletal:     Right lower leg: No edema.  Left lower leg: No edema.  Neurological:     Mental Status: She is alert and oriented to person, place, and time.  Psychiatric:        Behavior: Behavior normal.    Assessment/Plan  1. Primary hypertension Well controlled. Continue with current medication.   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 Chase Caller, Pulm Continue breztri; but try with spacer.  - Spacer/Aero-Holding Chambers (AEROCHAMBER PLUS) inhaler; Use as instructed  Dispense: 1 each; Refill: 0  3. Mixed hyperlipidemia Has been controlled on crestor.   4. Osteopenia, unspecified location Discussed weight bearing exercise.    Return in about 6 months (around 09/20/2021) for Chronic condition  visit.      Micheline Rough, MD

## 2021-03-20 NOTE — Patient Instructions (Signed)
Adult Exercise Classes   [employer]: [email or web address] [phone]) All classes are free, but you have to register. Zoom classes are offered. Variety includes yoga, chair yoga, arthritis classes, Tai Chi, line dancing, balance classes, boot camp, and more!   [place] resources from [employer]" (gathering place for friends with dementia and caregivers to have social time, coffee, treats) to exercise classes. All free! PBS  Daily chair exercises/Yoga classes

## 2021-03-25 ENCOUNTER — Telehealth: Payer: Self-pay | Admitting: Family Medicine

## 2021-03-25 NOTE — Chronic Care Management (AMB) (Signed)
  Chronic Care Management   Outreach Note  03/25/2021 Name: Cyera Balboni MRN: [id number] DOB: May 31, 1941  Referred by: Caren Macadam, MD Reason for referral : No chief complaint on file.   Third unsuccessful telephone outreach was attempted today. The patient was referred to the pharmacist for assistance with care management and care coordination.   Follow Up Plan:   Tatjana Dellinger Upstream Scheduler

## 2021-03-26 ENCOUNTER — Ambulatory Visit
Admission: RE | Admit: 2021-03-26 | Discharge: 2021-03-26 | Disposition: A | Discharge status: Home / Self Care | Payer: Medicare Other | Source: Ambulatory Visit | Attending: Family Medicine | Admitting: Family Medicine

## 2021-03-26 ENCOUNTER — Other Ambulatory Visit: Payer: Self-pay

## 2021-03-26 DIAGNOSIS — M85831 Other specified disorders of bone density and structure, right forearm: Secondary | ICD-10-CM | POA: Diagnosis not present

## 2021-03-26 DIAGNOSIS — Z780 Asymptomatic menopausal state: Secondary | ICD-10-CM | POA: Diagnosis not present

## 2021-03-26 DIAGNOSIS — E2839 Other primary ovarian failure: Secondary | ICD-10-CM

## 2021-04-17 ENCOUNTER — Ambulatory Visit (INDEPENDENT_AMBULATORY_CARE_PROVIDER_SITE_OTHER): Payer: Medicare Other

## 2021-04-17 DIAGNOSIS — Z23 Encounter for immunization: Secondary | ICD-10-CM | POA: Diagnosis not present

## 2021-05-11 ENCOUNTER — Inpatient Hospital Stay: Admission: RE | Admit: 2021-05-11 | Payer: Medicare Other | Source: Ambulatory Visit

## 2021-06-03 ENCOUNTER — Other Ambulatory Visit: Payer: Self-pay | Admitting: Family Medicine

## 2021-06-03 DIAGNOSIS — Z1231 Encounter for screening mammogram for malignant neoplasm of breast: Secondary | ICD-10-CM

## 2021-06-22 IMAGING — CT CT CHEST W/O CM
1 series · 15 of 34 positions shown, 19 images · non-contrast
Comparison: 06/19/2018

CLINICAL DATA: COPD exacerbation.  Emphysema.  Pulmonary nodules.

EXAM:
CT CHEST WITHOUT CONTRAST
TECHNIQUE: Multidetector CT imaging of the chest was performed following the
standard protocol without IV contrast.

[Series 2: thorax · axial · 0.64mm/px · z∈[-287,-27]mm · 15 of 154 slices shown, 19 images]
[im 12/154  mediastinal]
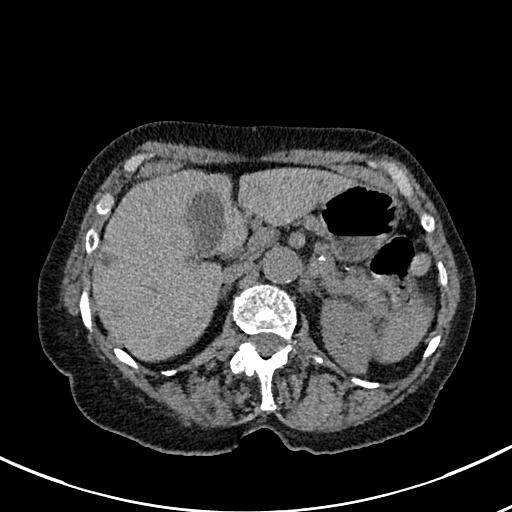
[im 12/154  lung]
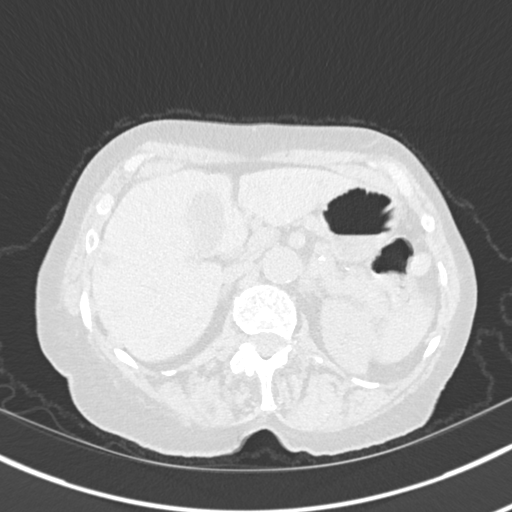
[im 23/154  lung]
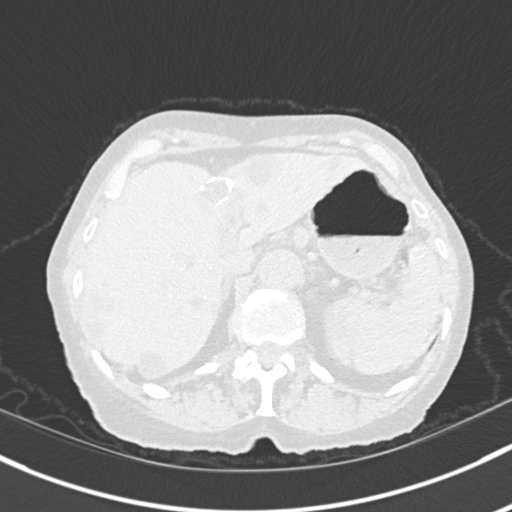
[im 31/154  lung]
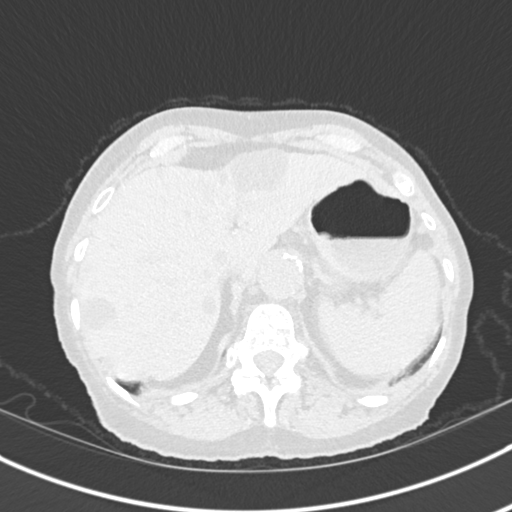
[im 40/154  lung]
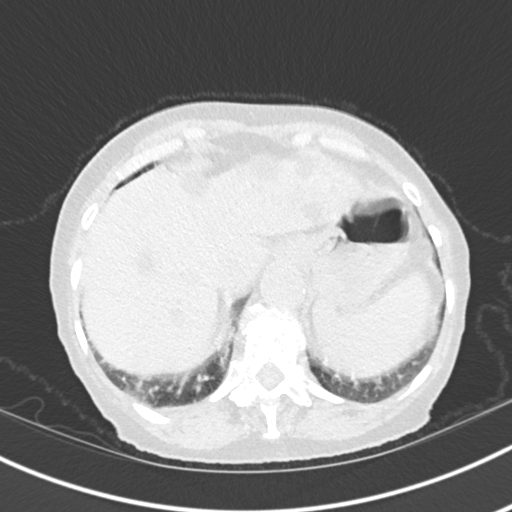
[im 52/154  mediastinal]
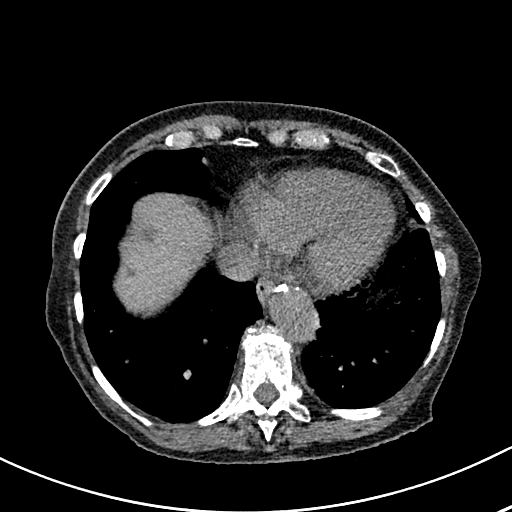
[im 52/154  lung]
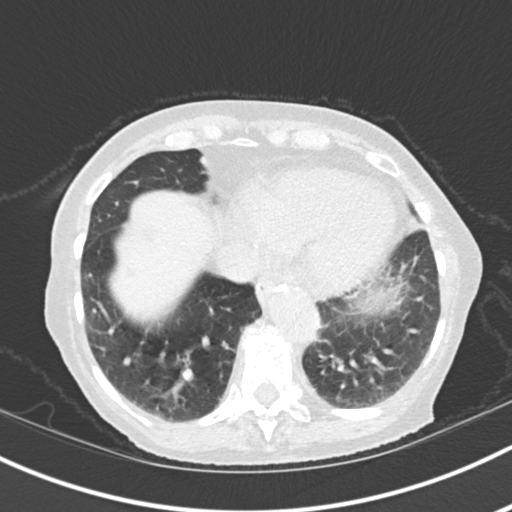
[im 62/154  lung]
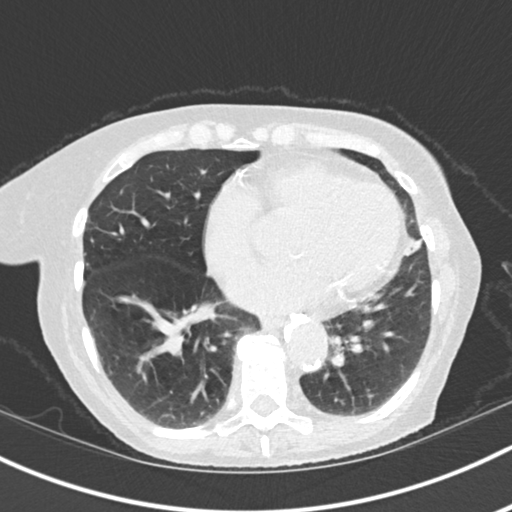
[im 69/154  lung]
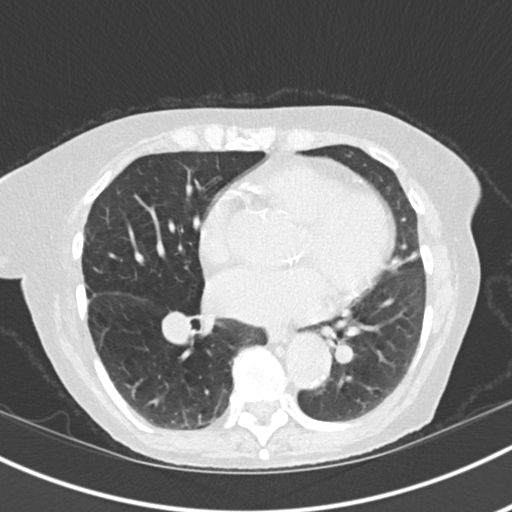
[im 80/154  lung]
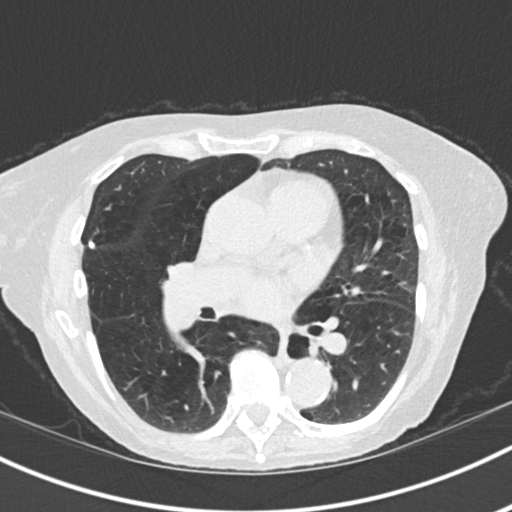
[im 86/154  mediastinal]
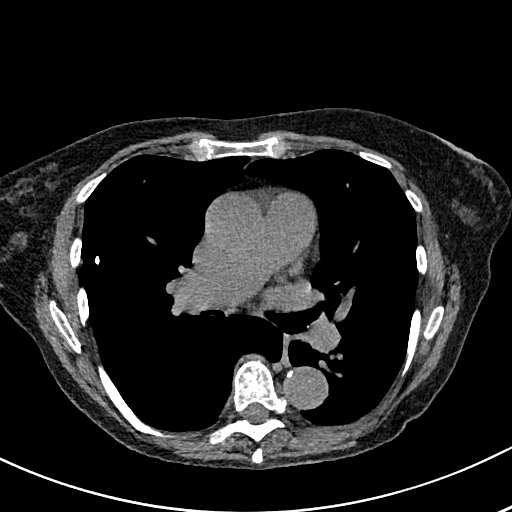
[im 86/154  lung]
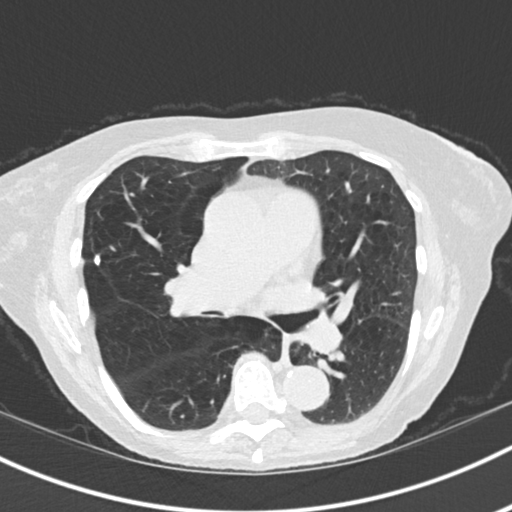
[im 92/154  lung]
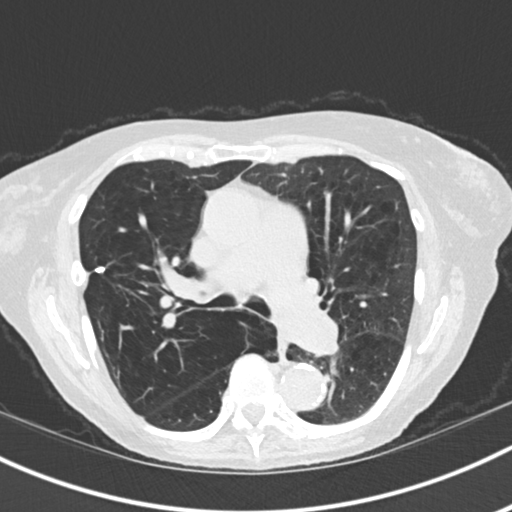
[im 103/154  lung]
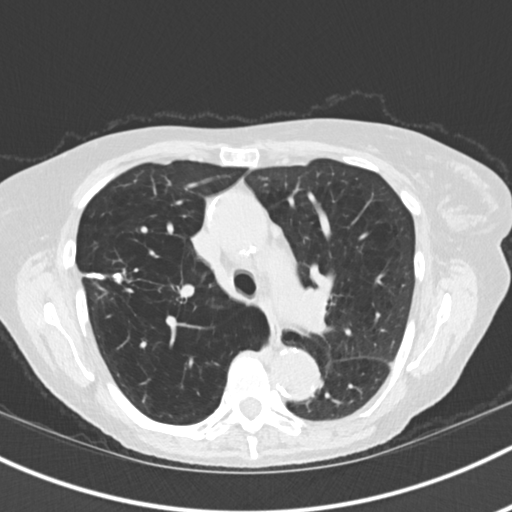
[im 114/154  lung]
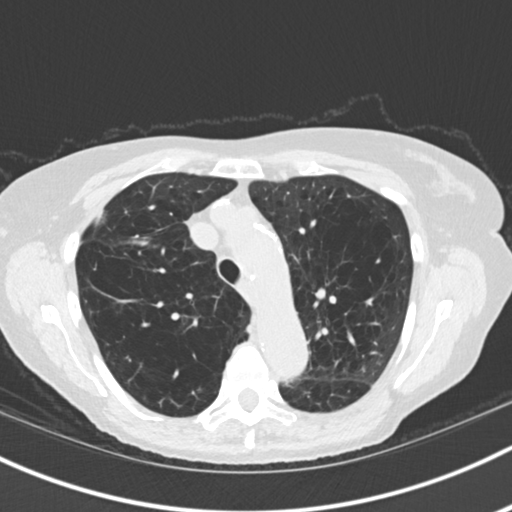
[im 123/154  mediastinal]
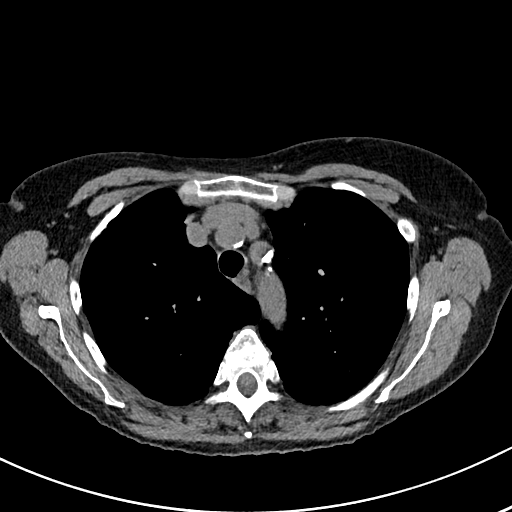
[im 123/154  lung]
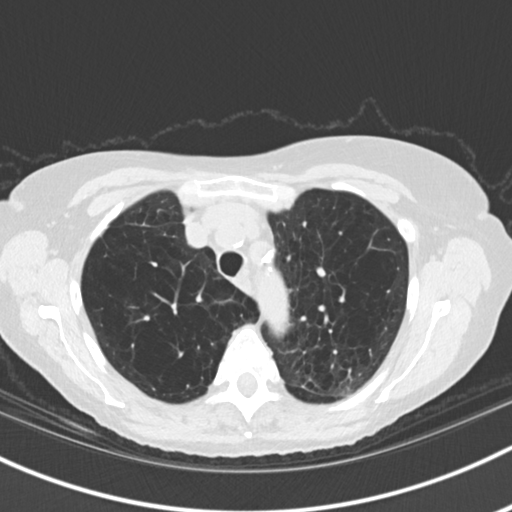
[im 131/154  lung]
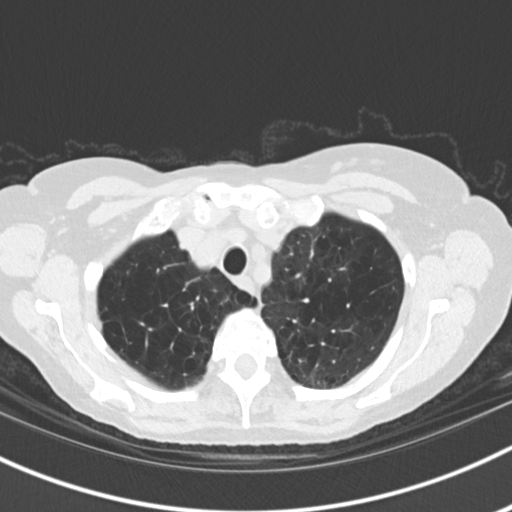
[im 142/154  lung]
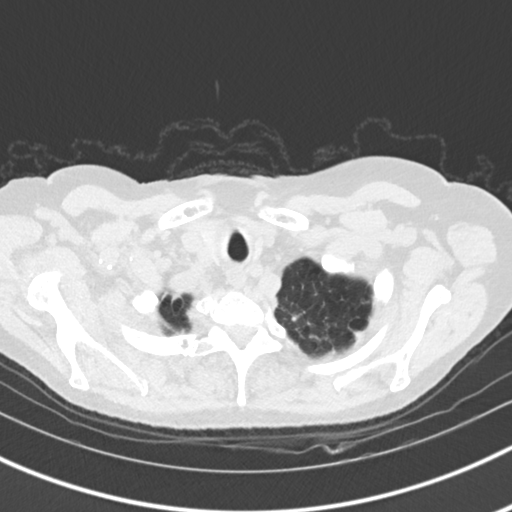

[15 of 34 positions shown; findings below may reference images not displayed]

FINDINGS: Cardiovascular: Heart size upper normal. No substantial pericardial
effusion. Coronary artery calcification is evident. Atherosclerotic
calcification is noted in the wall of the thoracic aorta. Prominence
of the main pulmonary artery suggests pulmonary arterial
hypertension.

Mediastinum/Nodes: Scattered small mediastinal nodes again noted. No
evidence for gross hilar lymphadenopathy although assessment is
limited by the lack of intravenous contrast on today's study. The
esophagus has normal imaging features. There is no axillary
lymphadenopathy.

Lungs/Pleura: Centrilobular and paraseptal emphysema evident.
Postsurgical scarring noted right lung. Scattered areas of
architectural distortion again noted. No new suspicious pulmonary
nodule or mass. No focal airspace consolidation. No pleural
effusion. Trace scarring in the lingula is stable.

Upper Abdomen: Multiple hepatic cysts are again noted, including rim
calcified cyst along the falciform ligament. Gallbladder not
included on today's study.

Musculoskeletal: No worrisome lytic or sclerotic osseous
abnormality.
IMPRESSION: 1. Stable exam.  No new or progressive interval findings.
2.  Emphysema. (SNOIM-L66.5)
3. Enlargement of the pulmonary arteries, compatible with pulmonary
arterial hypertension.
4.  Aortic Atherosclerois (SNOIM-170.0)

## 2021-07-01 ENCOUNTER — Ambulatory Visit: Payer: Medicare Other

## 2021-09-23 ENCOUNTER — Ambulatory Visit: Payer: Medicare Other | Admitting: Family Medicine

## 2021-09-23 NOTE — Progress Notes (Unsigned)
No show

## 2021-10-06 ENCOUNTER — Encounter (INDEPENDENT_AMBULATORY_CARE_PROVIDER_SITE_OTHER): Payer: Medicare Other | Admitting: Ophthalmology

## 2021-10-13 ENCOUNTER — Encounter (INDEPENDENT_AMBULATORY_CARE_PROVIDER_SITE_OTHER): Payer: Medicare Other | Admitting: Ophthalmology

## 2021-10-26 ENCOUNTER — Encounter (INDEPENDENT_AMBULATORY_CARE_PROVIDER_SITE_OTHER): Payer: Medicare Other | Admitting: Ophthalmology

## 2021-10-27 ENCOUNTER — Encounter (INDEPENDENT_AMBULATORY_CARE_PROVIDER_SITE_OTHER): Payer: Medicare Other | Admitting: Ophthalmology

## 2021-10-28 ENCOUNTER — Emergency Department (HOSPITAL_COMMUNITY): Payer: Medicare Other

## 2021-10-28 ENCOUNTER — Encounter (HOSPITAL_COMMUNITY): Payer: Self-pay | Admitting: Emergency Medicine

## 2021-10-28 ENCOUNTER — Other Ambulatory Visit: Payer: Self-pay

## 2021-10-28 ENCOUNTER — Observation Stay (HOSPITAL_COMMUNITY)
Admission: EM | Admit: 2021-10-28 | Discharge: 2021-10-29 | Disposition: A | Discharge status: Home / Self Care | Payer: Medicare Other | Attending: Family Medicine | Admitting: Family Medicine

## 2021-10-28 ENCOUNTER — Inpatient Hospital Stay (HOSPITAL_COMMUNITY): Payer: Medicare Other

## 2021-10-28 DIAGNOSIS — R0902 Hypoxemia: Secondary | ICD-10-CM | POA: Diagnosis not present

## 2021-10-28 DIAGNOSIS — J9601 Acute respiratory failure with hypoxia: Secondary | ICD-10-CM | POA: Insufficient documentation

## 2021-10-28 DIAGNOSIS — G9340 Encephalopathy, unspecified: Secondary | ICD-10-CM | POA: Diagnosis not present

## 2021-10-28 DIAGNOSIS — Z87891 Personal history of nicotine dependence: Secondary | ICD-10-CM | POA: Insufficient documentation

## 2021-10-28 DIAGNOSIS — R778 Other specified abnormalities of plasma proteins: Secondary | ICD-10-CM | POA: Diagnosis not present

## 2021-10-28 DIAGNOSIS — Z20822 Contact with and (suspected) exposure to covid-19: Secondary | ICD-10-CM | POA: Diagnosis not present

## 2021-10-28 DIAGNOSIS — Z743 Need for continuous supervision: Secondary | ICD-10-CM | POA: Diagnosis not present

## 2021-10-28 DIAGNOSIS — R410 Disorientation, unspecified: Secondary | ICD-10-CM

## 2021-10-28 DIAGNOSIS — I10 Essential (primary) hypertension: Secondary | ICD-10-CM | POA: Insufficient documentation

## 2021-10-28 DIAGNOSIS — J441 Chronic obstructive pulmonary disease with (acute) exacerbation: Secondary | ICD-10-CM | POA: Diagnosis not present

## 2021-10-28 DIAGNOSIS — R6889 Other general symptoms and signs: Secondary | ICD-10-CM | POA: Diagnosis not present

## 2021-10-28 DIAGNOSIS — Z96643 Presence of artificial hip joint, bilateral: Secondary | ICD-10-CM | POA: Insufficient documentation

## 2021-10-28 DIAGNOSIS — Z79899 Other long term (current) drug therapy: Secondary | ICD-10-CM | POA: Diagnosis not present

## 2021-10-28 DIAGNOSIS — Z96653 Presence of artificial knee joint, bilateral: Secondary | ICD-10-CM | POA: Insufficient documentation

## 2021-10-28 DIAGNOSIS — R0602 Shortness of breath: Secondary | ICD-10-CM | POA: Diagnosis not present

## 2021-10-28 DIAGNOSIS — R531 Weakness: Secondary | ICD-10-CM | POA: Diagnosis not present

## 2021-10-28 DIAGNOSIS — J439 Emphysema, unspecified: Secondary | ICD-10-CM | POA: Diagnosis not present

## 2021-10-28 DIAGNOSIS — R9431 Abnormal electrocardiogram [ECG] [EKG]: Secondary | ICD-10-CM | POA: Diagnosis not present

## 2021-10-28 LAB — BASIC METABOLIC PANEL
Anion gap: 8 (ref 5–15)
BUN: 10 mg/dL (ref 8–23)
CO2: 26 mmol/L (ref 22–32)
Calcium: 9 mg/dL (ref 8.9–10.3)
Chloride: 104 mmol/L (ref 98–111)
Creatinine, Ser: 0.91 mg/dL (ref 0.44–1.00)
GFR, Estimated: >60 mL/min (ref >60)
Glucose, Bld: 99 mg/dL (ref 70–99)
Potassium: 3.9 mmol/L (ref 3.5–5.1)
Sodium: 138 mmol/L (ref 135–145)

## 2021-10-28 LAB — CBC WITH DIFFERENTIAL/PLATELET
Abs Immature Granulocytes: 0.01 10*3/uL (ref 0.00–0.07)
Basophils Absolute: 0.1 10*3/uL (ref 0.0–0.1)
Basophils Relative: 2 %
Eosinophils Absolute: 0.2 10*3/uL (ref 0.0–0.5)
Eosinophils Relative: 3 %
HCT: 40.4 % (ref 36.0–46.0)
Hemoglobin: 13.7 g/dL (ref 12.0–15.0)
Immature Granulocytes: 0 %
Lymphocytes Relative: 20 %
Lymphs Abs: 1 10*3/uL (ref 0.7–4.0)
MCH: 32.2 pg (ref 26.0–34.0)
MCHC: 33.9 g/dL (ref 30.0–36.0)
MCV: 94.8 fL (ref 80.0–100.0)
Monocytes Absolute: 0.4 10*3/uL (ref 0.1–1.0)
Monocytes Relative: 9 %
Neutro Abs: 3.2 10*3/uL (ref 1.7–7.7)
Neutrophils Relative %: 66 %
Platelets: 164 10*3/uL (ref 150–400)
RBC: 4.26 MIL/uL (ref 3.87–5.11)
RDW: 11.9 % (ref 11.5–15.5)
WBC: 4.9 10*3/uL (ref 4.0–10.5)
nRBC: 0 % (ref 0.0–0.2)

## 2021-10-28 LAB — I-STAT VENOUS BLOOD GAS, ED
Acid-Base Excess: 3 mmol/L — ABNORMAL HIGH (ref 0.0–2.0)
Bicarbonate: 27.7 mmol/L (ref 20.0–28.0)
Calcium, Ion: 1.09 mmol/L — ABNORMAL LOW (ref 1.15–1.40)
HCT: 39 % (ref 36.0–46.0)
Hemoglobin: 13.3 g/dL (ref 12.0–15.0)
O2 Saturation: 73 %
Potassium: 3.8 mmol/L (ref 3.5–5.1)
Sodium: 136 mmol/L (ref 135–145)
TCO2: 29 mmol/L (ref 22–32)
pCO2, Ven: 40.2 mmHg — ABNORMAL LOW (ref 44–60)
pH, Ven: 7.445 — ABNORMAL HIGH (ref 7.25–7.43)
pO2, Ven: 37 mmHg (ref 32–45)

## 2021-10-28 LAB — BRAIN NATRIURETIC PEPTIDE: B Natriuretic Peptide: 242.1 pg/mL — ABNORMAL HIGH (ref 0.0–100.0)

## 2021-10-28 LAB — TROPONIN I (HIGH SENSITIVITY)
Troponin I (High Sensitivity): 17 ng/L (ref <18)
Troponin I (High Sensitivity): 18 ng/L — ABNORMAL HIGH (ref <18)

## 2021-10-28 LAB — URINALYSIS, ROUTINE W REFLEX MICROSCOPIC
Bilirubin Urine: NEGATIVE
Glucose, UA: NEGATIVE mg/dL
Hgb urine dipstick: NEGATIVE
Ketones, ur: 20 mg/dL — AB
Leukocytes,Ua: NEGATIVE
Nitrite: NEGATIVE
Protein, ur: NEGATIVE mg/dL
Specific Gravity, Urine: 1.02 (ref 1.005–1.030)
pH: 5 (ref 5.0–8.0)

## 2021-10-28 LAB — AMMONIA: Ammonia: 20 umol/L (ref 9–35)

## 2021-10-28 LAB — RESP PANEL BY RT-PCR (FLU A&B, COVID) ARPGX2
Influenza A by PCR: NEGATIVE
Influenza B by PCR: NEGATIVE
SARS Coronavirus 2 by RT PCR: NEGATIVE

## 2021-10-28 MED ORDER — ONDANSETRON HCL 4 MG/2ML IJ SOLN
4.0000 mg | Freq: Four times a day (QID) | INTRAMUSCULAR | Status: DC | PRN
Start: 2021-10-28 — End: 2021-10-29

## 2021-10-28 MED ORDER — PREDNISONE 20 MG PO TABS
40.0000 mg | ORAL_TABLET | Freq: Every day | ORAL | Status: DC
Start: 2021-10-29 — End: 2021-10-29
  Administered 2021-10-29: 40 mg via ORAL
  Filled 2021-10-28: qty 2

## 2021-10-28 MED ORDER — UMECLIDINIUM BROMIDE 62.5 MCG/ACT IN AEPB
1.0000 | INHALATION_SPRAY | Freq: Every day | RESPIRATORY_TRACT | Status: DC
  Administered 2021-10-29: 1 via RESPIRATORY_TRACT
  Filled 2021-10-28: qty 7

## 2021-10-28 MED ORDER — ALBUTEROL SULFATE (2.5 MG/3ML) 0.083% IN NEBU
2.5000 mg | INHALATION_SOLUTION | Freq: Once | RESPIRATORY_TRACT | Status: AC
Start: 2021-10-28 — End: 2021-10-28
  Administered 2021-10-28: 2.5 mg via RESPIRATORY_TRACT
  Filled 2021-10-28: qty 3

## 2021-10-28 MED ORDER — HYDRALAZINE HCL 20 MG/ML IJ SOLN
10.0000 mg | INTRAMUSCULAR | Status: DC | PRN
Start: 2021-10-28 — End: 2021-10-29

## 2021-10-28 MED ORDER — ONDANSETRON HCL 4 MG PO TABS: 4.0000 mg | ORAL_TABLET | Freq: Four times a day (QID) | ORAL | Status: DC | PRN

## 2021-10-28 MED ORDER — FLUTICASONE FUROATE-VILANTEROL 100-25 MCG/ACT IN AEPB
1.0000 | INHALATION_SPRAY | Freq: Every day | RESPIRATORY_TRACT | Status: DC
  Administered 2021-10-29: 1 via RESPIRATORY_TRACT
  Filled 2021-10-28: qty 28

## 2021-10-28 MED ORDER — AMLODIPINE BESYLATE 5 MG PO TABS
5.0000 mg | ORAL_TABLET | Freq: Every day | ORAL | Status: DC
  Administered 2021-10-28 – 2021-10-29 (×2): 5 mg via ORAL
  Filled 2021-10-28 (×2): qty 1

## 2021-10-28 MED ORDER — GUAIFENESIN ER 600 MG PO TB12
600.0000 mg | ORAL_TABLET | Freq: Two times a day (BID) | ORAL | Status: DC
  Administered 2021-10-29: 600 mg via ORAL
  Filled 2021-10-28: qty 1

## 2021-10-28 MED ORDER — ACETAMINOPHEN 650 MG RE SUPP: 650.0000 mg | Freq: Four times a day (QID) | RECTAL | Status: DC | PRN

## 2021-10-28 MED ORDER — SODIUM CHLORIDE 0.9 % IV SOLN
100.0000 mg | Freq: Once | INTRAVENOUS | Status: AC
  Administered 2021-10-28: 100 mg via INTRAVENOUS
  Filled 2021-10-28: qty 100

## 2021-10-28 MED ORDER — BUDESON-GLYCOPYRROL-FORMOTEROL 160-9-4.8 MCG/ACT IN AERO: 2.0000 | INHALATION_SPRAY | Freq: Two times a day (BID) | RESPIRATORY_TRACT | Status: DC

## 2021-10-28 MED ORDER — ENOXAPARIN SODIUM 40 MG/0.4ML IJ SOSY
40.0000 mg | PREFILLED_SYRINGE | INTRAMUSCULAR | Status: DC
  Administered 2021-10-28: 40 mg via SUBCUTANEOUS
  Filled 2021-10-28: qty 0.4

## 2021-10-28 MED ORDER — METHYLPREDNISOLONE SODIUM SUCC 125 MG IJ SOLR
60.0000 mg | Freq: Once | INTRAMUSCULAR | Status: AC
  Administered 2021-10-28: 60 mg via INTRAVENOUS
  Filled 2021-10-28: qty 2

## 2021-10-28 MED ORDER — ALBUTEROL SULFATE (2.5 MG/3ML) 0.083% IN NEBU
2.5000 mg | INHALATION_SOLUTION | Freq: Four times a day (QID) | RESPIRATORY_TRACT | Status: DC
  Administered 2021-10-28 – 2021-10-29 (×3): 2.5 mg via RESPIRATORY_TRACT
  Filled 2021-10-28 (×3): qty 3

## 2021-10-28 MED ORDER — ACETAMINOPHEN 325 MG PO TABS: 650.0000 mg | ORAL_TABLET | Freq: Four times a day (QID) | ORAL | Status: DC | PRN

## 2021-10-28 MED ORDER — SODIUM CHLORIDE 0.9% FLUSH: 3.0000 mL | Freq: Two times a day (BID) | INTRAVENOUS | Status: DC

## 2021-10-28 MED ORDER — ALBUTEROL SULFATE (2.5 MG/3ML) 0.083% IN NEBU: 2.5000 mg | INHALATION_SOLUTION | RESPIRATORY_TRACT | Status: DC | PRN

## 2021-10-28 NOTE — ED Triage Notes (Signed)
Pt BIB [facility] from home, pt called out for SOB, pt has COPD. Pt placed on 2L via [place] and endorses improvement in sx. Audible wheezing on arrival to department. Neighbors and EMS concerned for increased confusion. 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 EMS reports pt had dayquil and nyquil bottles out and couldn't say why she took them, and that she drove to [facility] yesterday for no reason. Pt is a/ox3, disoriented to situation, and had repetitive questioning en route. EKG showed afib with PVCs.  ?

## 2021-10-28 NOTE — Assessment & Plan Note (Deleted)
Patient appears to be alert and oriented to person and place, but not to situation.  Initial CT scan of the head did not note any acute abnormalities.  Question if symptoms secondary to patient being hypoxic.  Urinalysis and chest x-ray did not note any clear signs of infection.  She was not noted to be hypercapnic on venous blood gas. ?-Bed alarm on ?-Delirium precaution ?-Neurochecks ?

## 2021-10-28 NOTE — ED Provider Notes (Signed)
?[facility] ?Provider Note ? ? ?CSN: [id number] ?Arrival date & time: 10/28/21  1154 ? ?  ? ?History ? ?Chief Complaint  ?Patient presents with  ? Shortness of Breath  ? Altered Mental Status  ? ? ?[name] is a 81 y.o. female. ? ?This is a 81 y.o. female with significant medical history as below, including COPD, hyperlipidemia, hypertension who presents to the ED with complaint of difficulty breathing, confusion.  Patient reports that her breathing is become worse for the past few days.  Worsening cough, white sputum.  Worsening exertional dyspnea.  No fevers or chills.  Does not use home oxygen or CPAP.  Reports that she uses on "over-the-counter inhaler."  EMS discussed with patient's neighbor who called 911.  Reports patient has been acting abnormally the past few days/week.  Apparently she had driven to [facility] and when she got there she did not know why that she was there. She is a poor historian. ? ?Called emergency contacts; unable to reach, VM left ?SEMORE,[name] Friend [phone]    ?Rickard Rhymes Brother [phone]   [phone]  ? ?Level 5 caveat, delirium ? ?Past Medical History: ?07/21/2020: Acute delirium ?No date: Allergic rhinitis ?No date: Anemia ?No date: Anxiety ?No date: Arthritis ?    Comment:  all over ?No date: Bilateral cataracts ?No date: Chicken pox ?05/21/2014: Cholelithiasis ?    Comment:  On CT scan 2015  ?No date: COPD (chronic obstructive pulmonary disease) ([place]) ?No date: Depression ?    Comment:  hx ocd and anxiety in record as well ?No date: Gout ?    Comment:  last time 6-12yrs ago  ?No date: History of blood transfusion ?No date: Hot flashes ?    Comment:  takes Prozac daily ?90's: Hx of migraines ?    Comment:  after menopause HA stopped ?No date: Hyperlipidemia ?    Comment:  taking Flax Seed Oil and Fish Oil ?No date: Hypertension ?    Comment:  takes Amlodipine nightly ?No date: IBS (irritable bowel syndrome) ?No date:  Liver cyst ?No date: Macrocytosis ?No date: Osteopenia ?    Comment:  takes Calcium and Vit D bid ?No date: Pneumonia ?    Comment:  x 2 ;last time back in 1999 ?No date: PONV (postoperative nausea and vomiting) ?03/07/2012: S/P right THA, AA - Dr. Alvan Dame, ortho ?01/02/2013: S/P right TKA ?No date: Seasonal allergies ?09/12/2011: Smoking ? ?Past Surgical History: ?80's: CARPAL TUNNEL RELEASE ?    Comment:  right  ?No date: COLONOSCOPY ?>50yrs ago: cyst removed ?    Comment:  from left leg ?No date: DILATION AND CURETTAGE OF UTERUS ?    Comment:  at age 89 ?06/03/2014: EXCISIONAL TOTAL KNEE ARTHROPLASTY; Right ?    Comment:  Procedure: RIGHT KNEE SCAR EXCISION;  Surgeon: Pietro Cassis ?             Alvan Dame, MD;  Location: [facility];  Service: Orthopedics;   ?             Laterality: Right; ?2000: INGUINAL HERNIA REPAIR ?07/18/2020: INGUINAL HERNIA REPAIR; Left ?    Comment:  Procedure: HERNIA  REPAIR INGUINAL ADULT REPAIR;   ?             Surgeon: Michael Boston, MD;  Location: [facility];  Service:  ?             General;  Laterality: Left; ?2001: [place] ?    Comment:  right ?as a child: TONSILLECTOMY ?    Comment:  and adenoids ?2003: TOTAL ABDOMINAL HYSTERECTOMY ?    Comment:  benign tumor-[place] ?2011: TOTAL HIP ARTHROPLASTY ?    Comment:  left ?03/07/2012: TOTAL HIP ARTHROPLASTY ?    Comment:  Procedure: TOTAL HIP ARTHROPLASTY ANTERIOR APPROACH;   ?             Surgeon: [name], MD;  Location: [facility];  Service: ?             Orthopedics;  Laterality: Right; ?01/02/2013: TOTAL KNEE ARTHROPLASTY; Right ?    Comment:  Procedure: RIGHT TOTAL KNEE ARTHROPLASTY;  Surgeon:  ?             [name], MD;  Location: [facility];  Service:  ?             Orthopedics;  Laterality: Right; ?04/03/2013: TOTAL KNEE ARTHROPLASTY; Left ?    Comment:  Procedure: LEFT TOTAL KNEE ARTHROPLASTY;  Surgeon:  ?             [name], MD;  Location: [facility];  Service:  ?             Orthopedics;  Laterality: Left; ?07/18/2020: VENTRAL  HERNIA REPAIR; N/A ?    Comment:  Procedure: LAPAROSCOPIC BILATERAL SPIGELIABN HERNIAS  ?             WITH MESH, UMBILICAL HERNIA;  Surgeon: Michael Boston, MD; ?             Location: [facility];  Service: General;  Laterality: N/A; ?10/20/2011: VIDEO BRONCHOSCOPY ?    Comment:  Procedure: VIDEO BRONCHOSCOPY;  Surgeon: Lilia Argue  ?             Servando Snare, MD;  Location: [facility];  Service: Thoracic;   ?             Laterality: N/A; ?10-20-2011: WEDGE RESECTION ?    Comment:  rt lung  - for lung cancer  ? ? ?The history is provided by the patient. No language interpreter was used.  ?Shortness of Breath ?Associated symptoms: cough   ?Associated symptoms: no abdominal pain, no chest pain, no fever, no headaches, no rash and no vomiting   ?Altered Mental Status ?Presenting symptoms: no confusion   ?Associated symptoms: no abdominal pain, no agitation, no fever, no headaches, no nausea, no palpitations, no rash and no vomiting   ? ?  ? ?Home Medications ?Prior to Admission medications   ?Medication Sig Start Date End Date Taking? Authorizing Provider  ?acetaminophen (TYLENOL) 325 MG tablet Take 325 mg by mouth every 6 (six) hours as needed for moderate pain. Alternates with Ibuprofen   Yes [provider]  ?BREZTRI AEROSPHERE 160-9-4.8 MCG/ACT AERO INHALE 2 PUFFS INTO THE LUNGS TWICE DAILY ?Patient taking differently: Inhale 2 puffs into the lungs daily as needed (For shortness of breath). 12/22/20  Yes Brand Males, MD  ?PROAIR HFA 108 (90 Base) MCG/ACT inhaler INHALE 2 PUFFS BY MOUTH EVERY 6 HOURS AS NEEDED FOR WHEEZING AND FOR SHORTNESS OF BREATH 02/06/21  Yes Brand Males, MD  ?amLODipine (NORVASC) 5 MG tablet TAKE 1 TABLET BY MOUTH AT BEDTIME . APPOINTMENT REQUIRED FOR FUTURE REFILLS ?Patient not taking: Reported on 10/28/2021 11/19/20   Caren Macadam, MD  ?cholecalciferol (VITAMIN D3) 25 MCG (1000 UNIT) tablet Take 1,000 Units by mouth daily. ?Patient not taking: Reported on 10/28/2021    [provider]  ?  FLUoxetine (PROZAC) 20 MG capsule Take 1 capsule by mouth twice daily ?Patient not taking: Reported on 10/28/2021 11/19/20   Caren Macadam, MD  ?rosuvastatin (CRESTOR) 5 MG tablet Take 1 tablet by mouth once daily ?Patient not taking: Reported on 10/28/2021 12/01/20   Caren Macadam, MD  ?Spacer/Aero-Holding Chambers (AEROCHAMBER PLUS) inhaler Use as instructed 03/20/21   Caren Macadam, MD  ?vitamin B-12 (CYANOCOBALAMIN) 1000 MCG tablet Take 1,000 mcg by mouth daily.  ?Patient not taking: Reported on 10/28/2021    [provider]  ?vitamin E 400 UNIT capsule Take 400 Units by mouth daily. ?Patient not taking: Reported on 10/28/2021    [provider]  ?   ? ?Allergies    ?Clindamycin/lincomycin, Penicillins, Spiriva [tiotropium bromide monohydrate], and Sulfa antibiotics   ? ?Review of Systems   ?Review of Systems  ?Constitutional:  Positive for fatigue. Negative for chills and fever.  ?HENT:  Negative for facial swelling and trouble swallowing.   ?Eyes:  Negative for photophobia and visual disturbance.  ?Respiratory:  Positive for cough and shortness of breath.   ?Cardiovascular:  Negative for chest pain and palpitations.  ?Gastrointestinal:  Negative for abdominal pain, nausea and vomiting.  ?Endocrine: Negative for polydipsia and polyuria.  ?Genitourinary:  Negative for difficulty urinating and hematuria.  ?Musculoskeletal:  Negative for gait problem and joint swelling.  ?Skin:  Negative for pallor and rash.  ?Neurological:  Negative for syncope and headaches.  ?     Confusion  ?Psychiatric/Behavioral:  Negative for agitation and confusion.   ? ?Physical Exam ?Updated Vital Signs ?BP (!) 156/109   Pulse (!) 102   Temp 98.4 ?F (36.9 ?C) (Oral)   Resp 15   SpO2 100%  ?Physical Exam ?Vitals and nursing note reviewed.  ?Constitutional:   ?   General: She is not in acute distress. ?   Appearance: Normal appearance. She is well-developed. She is not diaphoretic.  ?HENT:   ?   Head: Normocephalic and atraumatic.  ?   Right Ear: External ear normal.  ?   Left Ear: External ear normal.  ?   Nose: Nose normal.  ?   Mouth/Throat:  ?   Mouth: Mucous membranes are moist.  ?Eyes:  ?   Gener

## 2021-10-28 NOTE — Assessment & Plan Note (Signed)
High-sensitivity troponin 17->18, but otherwise noted to be flat.  No significant ischemic changes noted on EKG. ?-Continue to monitor ?

## 2021-10-28 NOTE — Assessment & Plan Note (Addendum)
Patient appears to be alert and oriented to person and place, but not to situation.  Initial CT scan of the head did not note any acute abnormalities.  Urinalysis and chest x-ray did not note any clear signs of infection.  She was not noted to be hypercapnic on venous blood gas. Unclear cause at this time question if the patient has some aspect of dementia or hypertensive encephalopathy. ?-Bed alarm on ?-Delirium precaution ?-Neurochecks ?-Check TSH ?-Check MRI of the brain ?

## 2021-10-28 NOTE — H&P (Signed)
?History and Physical  ? ? ?Patient: [name] SWF:[id number] DOB: 1941-04-26 ?DOA: 10/28/2021 ?DOS: the patient was seen and examined on 10/28/2021 ?PCP: Caren Macadam, MD  ?Patient coming from: Home via EMS ? ?Chief Complaint:  ?Chief Complaint  ?Patient presents with  ? Shortness of Breath  ? Altered Mental Status  ? ?HPI: [name] is a 81 y.o. female with medical history significant of hypertension, hyperlipidemia, COPD, anxiety, and depression presents with complaints of shortness of breath.  The patient reports that she does not really know why she is here.  There is reported that neighbors and EMS were concern for increased confusion.  Neighbors reported that the patient had drove to [facility] yesterday for no reason.  In route with EMS patient was noted to be disoriented to situation.  Was reported that patient is the primary caregiver to her husband who has dementia.  She also reports sometimes living with her brother.  Called patient's friend Enzo Bi but she reports that she moved this 9 months ago and does not know what is currently going on with Ms. Dalbert Batman.  Other contacts including her brother and husband were not able to be reached by phone at this time. ? ? ?Upon admission into the emergency department patient was seen to be afebrile with blood pressures elevated up to 176/120, and O2 saturations noted to be as low as 88% with improvement on 2 L nasal cannula oxygen.  Labs significant for BNP 242.1 and high-sensitivity troponin 17->18.  Venous pH 7.445, PCO2 40.2,  and PO2 37. Chest x-ray and CT scan of the head showed no acute abnormalities. Influenza and COVID-19 screening were negative.  Patient has been given Solu-Medrol 2 mg IV x1 dose. ? ? ?Review of Systems: unable to review all systems due to the inability of the patient to answer questions. ?Past Medical History:  ?Diagnosis Date  ? Acute delirium 07/21/2020  ? Allergic rhinitis   ? Anemia   ? Anxiety   ?  Arthritis   ? all over  ? Bilateral cataracts   ? Chicken pox   ? Cholelithiasis 05/21/2014  ? On CT scan 2015   ? COPD (chronic obstructive pulmonary disease) ([place])   ? Depression   ? hx ocd and anxiety in record as well  ? Gout   ? last time 6-53yrs ago   ? History of blood transfusion   ? Hot flashes   ? takes Prozac daily  ? Hx of migraines 90's  ? after menopause HA stopped  ? Hyperlipidemia   ? taking Flax Seed Oil and Fish Oil  ? Hypertension   ? takes Amlodipine nightly  ? IBS (irritable bowel syndrome)   ? Liver cyst   ? Macrocytosis   ? Osteopenia   ? takes Calcium and Vit D bid  ? Pneumonia   ? x 2 ;last time back in 1999  ? PONV (postoperative nausea and vomiting)   ? S/P right THA, AA - Dr. Alvan Dame, ortho 03/07/2012  ? 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 Seasonal allergies   ? Smoking 09/12/2011  ? ?Past Surgical History:  ?Procedure Laterality Date  ? CARPAL TUNNEL RELEASE  80's  ? right   ? COLONOSCOPY    ? cyst removed  >63yrs ago  ? from left leg  ? DILATION AND CURETTAGE OF UTERUS    ? at age 57  ? EXCISIONAL TOTAL KNEE ARTHROPLASTY Right 06/03/2014  ? Procedure: RIGHT KNEE SCAR EXCISION;  Surgeon:  [name], MD;  Location: [facility];  Service: Orthopedics;  Laterality: Right;  ? INGUINAL HERNIA REPAIR  2000  ? INGUINAL HERNIA REPAIR Left 07/18/2020  ? Procedure: HERNIA  REPAIR INGUINAL ADULT REPAIR;  Surgeon: Michael Boston, MD;  Location: [facility];  Service: General;  Laterality: Left;  ? [place]  2001  ? right  ? TONSILLECTOMY  as a child  ? and adenoids  ? TOTAL ABDOMINAL HYSTERECTOMY  2003  ? benign tumor-[place]  ? TOTAL HIP ARTHROPLASTY  2011  ? left  ? TOTAL HIP ARTHROPLASTY  03/07/2012  ? Procedure: TOTAL HIP ARTHROPLASTY ANTERIOR APPROACH;  Surgeon: [name], MD;  Location: [facility];  Service: Orthopedics;  Laterality: Right;  ? TOTAL KNEE ARTHROPLASTY Right 01/02/2013  ? Procedure: RIGHT TOTAL KNEE ARTHROPLASTY;  Surgeon: [name], MD;  Location: [facility];  Service: Orthopedics;   Laterality: Right;  ? TOTAL KNEE ARTHROPLASTY Left 04/03/2013  ? Procedure: LEFT TOTAL KNEE ARTHROPLASTY;  Surgeon: [name], MD;  Location: [facility];  Service: Orthopedics;  Laterality: Left;  ? VENTRAL HERNIA REPAIR N/A 07/18/2020  ? Procedure: LAPAROSCOPIC BILATERAL SPIGELIABN HERNIAS WITH MESH, UMBILICAL HERNIA;  Surgeon: Michael Boston, MD;  Location: [facility];  Service: General;  Laterality: N/A;  ? VIDEO BRONCHOSCOPY  10/20/2011  ? Procedure: VIDEO BRONCHOSCOPY;  Surgeon: Grace Isaac, MD;  Location: [facility];  Service: Thoracic;  Laterality: N/A;  ? WEDGE RESECTION  10-20-2011  ? rt lung  - for lung cancer  ? ?Social History:  reports that she quit smoking about 10 years ago. Her smoking use included cigarettes. She has a 37.50 pack-year smoking history. She has never used smokeless tobacco. She reports current alcohol use. She reports that she does not use drugs. ? ?Allergies  ?Allergen Reactions  ? Clindamycin/Lincomycin Nausea And Vomiting  ? Penicillins Rash  ?   ?  ? Spiriva [Tiotropium Bromide Monohydrate]   ?  General intolerance  ? Sulfa Antibiotics Hives and Rash  ?  Severe Hives with skin reaction  ? ? ?Family History  ?Problem Relation Age of Onset  ? Other Mother   ?     polyarteritis  ? Bowel Disease Father 40  ?     colectomy/bowel obstruction issues  ? Heart attack Father   ? Hypertension Brother   ? Arthritis Brother   ? Post-traumatic stress disorder Brother   ? Alcohol abuse Other   ? Depression Other   ? Hearing loss Other   ? Heart disease Other   ? Hypertension Other   ? Anesthesia problems Neg Hx   ? Hypotension Neg Hx   ? Malignant hyperthermia Neg Hx   ? Pseudochol deficiency Neg Hx   ? Breast cancer Neg Hx   ? ? ?Prior to Admission medications   ?Medication Sig Start Date End Date Taking? Authorizing Provider  ?acetaminophen (TYLENOL) 325 MG tablet Take 325 mg by mouth every 6 (six) hours as needed for moderate pain. Alternates with Ibuprofen    [provider]  ?amLODipine  (NORVASC) 5 MG tablet TAKE 1 TABLET BY MOUTH AT BEDTIME . APPOINTMENT REQUIRED FOR FUTURE REFILLS 11/19/20   Caren Macadam, MD  ?BREZTRI AEROSPHERE 160-9-4.8 MCG/ACT AERO INHALE 2 PUFFS INTO THE LUNGS TWICE DAILY 12/22/20   Brand Males, MD  ?cholecalciferol (VITAMIN D3) 25 MCG (1000 UNIT) tablet Take 1,000 Units by mouth daily. ?Patient not taking: Reported on 10/28/2021    [provider]  ?FLUoxetine (PROZAC)  20 MG capsule Take 1 capsule by mouth twice daily 11/19/20   Koberlein, Steele Berg, MD  ?PROAIR HFA 108 (90 Base) MCG/ACT inhaler INHALE 2 PUFFS BY MOUTH EVERY 6 HOURS AS NEEDED FOR WHEEZING AND FOR SHORTNESS OF BREATH 02/06/21   Brand Males, MD  ?rosuvastatin (CRESTOR) 5 MG tablet Take 1 tablet by mouth once daily 12/01/20   Caren Macadam, MD  ?Spacer/Aero-Holding Chambers (AEROCHAMBER PLUS) inhaler Use as instructed 03/20/21   Caren Macadam, MD  ?vitamin B-12 (CYANOCOBALAMIN) 1000 MCG tablet Take 1,000 mcg by mouth daily.  ?Patient not taking: Reported on 10/28/2021    [provider]  ?vitamin E 400 UNIT capsule Take 400 Units by mouth daily. ?Patient not taking: Reported on 10/28/2021    [provider]  ? ? ?Physical Exam: ?Vitals:  ? 10/28/21 1415 10/28/21 1445 10/28/21 1500 10/28/21 1515  ?BP: (!) 147/104 (!) 147/100 (!) 150/115 (!) 154/105  ?Pulse: 81 89 91 83  ?Resp: 16 17  16   ?Temp:      ?TempSrc:      ?SpO2: 97% 93% 94% 96%  ? ? ?Constitutional: Elderly female currently in no acute distress ?Eyes: PERRL, lids and conjunctivae normal ?ENMT: Mucous membranes are moist. Posterior pharynx clear of any exudate or lesions.  ?Neck: normal, supple, no masses, no thyromegaly ?Respiratory: Normal effort with decreased overall aeration and expiratory wheezes appreciated on physical exam. ?Cardiovascular: Regular rate and rhythm, no murmurs / rubs / gallops. No extremity edema.   ?Abdomen: no tenderness, no masses palpated. No hepatosplenomegaly. Bowel sounds  positive.  ?Musculoskeletal: no clubbing / cyanosis. No joint deformity upper and lower extremities. Good ROM, no contractures. Normal muscle tone.  ?Skin: no rashes, lesions, ulcers. No induration ?Neurologic: CN

## 2021-10-28 NOTE — ED Notes (Signed)
This RN attempted to ambulate pt with pulse ox. SpO2 88-90% on room air. Pt placed on 2L via [place].  ?

## 2021-10-28 NOTE — ED Notes (Signed)
Patient transported to MRI 

## 2021-10-28 NOTE — Assessment & Plan Note (Addendum)
Upon admission into the emergency department patient was noted to be hypoxic down to 88% on room air.  Patient was placed on 2 L nasal cannula oxygen with O2 saturations greater than 92%.  Chest x-ray noted no acute abnormality.  Patient was noted to have wheezing on physical exam with decreased aeration. ?-Admit to a telemetry bed ?-Continue nasal cannula oxygen to maintain O2 saturations greater than 92% ?-Albuterol nebs 4 times daily and as needed ?-Continue Breztri inhaler ?-Prednisone ?

## 2021-10-28 NOTE — Assessment & Plan Note (Signed)
Uncontrolled on admission blood pressure elevated up to 176/120.  Previously had been on amlodipine 5 mg nightly, but appears not to be taking this medication any longer.   ?-Resume amlodipine ?-Hydralazine IV as needed ?

## 2021-10-29 DIAGNOSIS — Z96653 Presence of artificial knee joint, bilateral: Secondary | ICD-10-CM | POA: Diagnosis not present

## 2021-10-29 DIAGNOSIS — J9601 Acute respiratory failure with hypoxia: Secondary | ICD-10-CM | POA: Diagnosis not present

## 2021-10-29 DIAGNOSIS — R778 Other specified abnormalities of plasma proteins: Secondary | ICD-10-CM | POA: Diagnosis not present

## 2021-10-29 DIAGNOSIS — Z87891 Personal history of nicotine dependence: Secondary | ICD-10-CM | POA: Diagnosis not present

## 2021-10-29 DIAGNOSIS — Z79899 Other long term (current) drug therapy: Secondary | ICD-10-CM | POA: Diagnosis not present

## 2021-10-29 DIAGNOSIS — Z20822 Contact with and (suspected) exposure to covid-19: Secondary | ICD-10-CM | POA: Diagnosis not present

## 2021-10-29 DIAGNOSIS — Z96643 Presence of artificial hip joint, bilateral: Secondary | ICD-10-CM | POA: Diagnosis not present

## 2021-10-29 DIAGNOSIS — J441 Chronic obstructive pulmonary disease with (acute) exacerbation: Secondary | ICD-10-CM | POA: Diagnosis not present

## 2021-10-29 DIAGNOSIS — G9340 Encephalopathy, unspecified: Secondary | ICD-10-CM | POA: Diagnosis not present

## 2021-10-29 DIAGNOSIS — I10 Essential (primary) hypertension: Secondary | ICD-10-CM | POA: Diagnosis not present

## 2021-10-29 LAB — BASIC METABOLIC PANEL
Anion gap: 9 (ref 5–15)
BUN: 17 mg/dL (ref 8–23)
CO2: 23 mmol/L (ref 22–32)
Calcium: 9 mg/dL (ref 8.9–10.3)
Chloride: 105 mmol/L (ref 98–111)
Creatinine, Ser: 1.07 mg/dL — ABNORMAL HIGH (ref 0.44–1.00)
GFR, Estimated: 53 mL/min — ABNORMAL LOW (ref >60)
Glucose, Bld: 105 mg/dL — ABNORMAL HIGH (ref 70–99)
Potassium: 4 mmol/L (ref 3.5–5.1)
Sodium: 137 mmol/L (ref 135–145)

## 2021-10-29 LAB — BLOOD GAS, VENOUS
Acid-Base Excess: 4.3 mmol/L — ABNORMAL HIGH (ref 0.0–2.0)
Bicarbonate: 29.8 mmol/L — ABNORMAL HIGH (ref 20.0–28.0)
O2 Saturation: 40.9 %
Patient temperature: 37
pCO2, Ven: 47 mmHg (ref 44–60)
pH, Ven: 7.41 (ref 7.25–7.43)
pO2, Ven: <31 mmHg — CL (ref 32–45)

## 2021-10-29 LAB — CBC
HCT: 37.2 % (ref 36.0–46.0)
Hemoglobin: 13 g/dL (ref 12.0–15.0)
MCH: 32.6 pg (ref 26.0–34.0)
MCHC: 34.9 g/dL (ref 30.0–36.0)
MCV: 93.2 fL (ref 80.0–100.0)
Platelets: 156 10*3/uL (ref 150–400)
RBC: 3.99 MIL/uL (ref 3.87–5.11)
RDW: 11.9 % (ref 11.5–15.5)
WBC: 6 10*3/uL (ref 4.0–10.5)
nRBC: 0 % (ref 0.0–0.2)

## 2021-10-29 LAB — TSH: TSH: 1.133 u[IU]/mL (ref 0.350–4.500)

## 2021-10-29 MED ORDER — PREDNISONE 20 MG PO TABS: 40.0000 mg | ORAL_TABLET | Freq: Every day | ORAL | 0 refills | Status: AC

## 2021-10-29 MED ORDER — BUDESONIDE-FORMOTEROL FUMARATE 80-4.5 MCG/ACT IN AERO: 2.0000 | INHALATION_SPRAY | Freq: Every day | RESPIRATORY_TRACT | 12 refills | Status: AC

## 2021-10-29 MED ORDER — GUAIFENESIN ER 600 MG PO TB12: 600.0000 mg | ORAL_TABLET | Freq: Two times a day (BID) | ORAL | 0 refills | Status: AC

## 2021-10-29 NOTE — Progress Notes (Signed)
Patient ambulated approximately 300 feet on room air. Tolerated well. ?Oxygen saturation on room air at rest 95%. ?Oxygen saturation on room air while ambulating 93%. ? ?[name], RN ? ?

## 2021-10-29 NOTE — Discharge Summary (Signed)
Physician Discharge Summary  ?[name] WGN:[id number] DOB: [birth year] DOA: 10/28/2021 ? ?PCP: Caren Macadam, MD ? ?Admit date: 10/28/2021 ? ?Discharge date: 10/29/2021 ? ?Admitted From: Home. ? ?Disposition: Home ? ?Recommendations for Outpatient Follow-up:  ?Follow up with PCP in 1-2 weeks ?Please obtain BMP/CBC in one week. ?Patient reports feeling much better and want to be discharged. ?Advised to take prednisone 40 mg daily for 4 more days to complete 5-day treatment. ? ?Home Health:None ?Equipment/Devices:None ? ?Discharge Condition: Stable ?CODE STATUS:Full code ?Diet recommendation: Heart Healthy  ? ?Brief Summary/ Hospital Course: ?This 81 years old female with PMH significant for hypertension, hyperlipidemia, COPD, anxiety, depression presented in the ED with complaints of shortness of breath.  Patient is primary caregiver for her husband who has dementia.  Patient reports sometimes she is living with her brother.  Patient does not know why she is here.  There were concerns from neighbor that patient has increased confusion.  Patient was admitted for COPD exacerbation.  She was continued on prednisone and nebulized bronchodilators.  Patient has clear lung sounds on exam.  No wheezing noted.  She was hypoxic on arrival SPO2 88% which improved to 100% on 2 L.  CT head was negative for any acute abnormality.  Patient feels better and  want to be discharged.  Spoke with her friend, states she moved to [place] 9 months ago.  Patient does have neighbors who take care of her.  Patient is being discharged home.  She seems alert and oriented x 3.  No signs of any infection.  Urine,  chest x-ray unremarkable.  It appears like patient does have some aspect of dementia, needs to be addressed outpatient. ? ?Discharge Diagnoses:  ?Principal Problem: ?  Acute respiratory failure with hypoxia (HCC) secondary to COPD exacerbation ?Active Problems: ?  Acute encephalopathy ?  Hypertension ?  COPD with acute  exacerbation ([place]) ?  Elevated troponin ? ?Discharge Instructions ? ?Discharge Instructions   ? ? Call MD for:  difficulty breathing, headache or visual disturbances   Complete by: As directed ?  ? Call MD for:  persistant dizziness or light-headedness   Complete by: As directed ?  ? Call MD for:  persistant nausea and vomiting   Complete by: As directed ?  ? Diet - low sodium heart healthy   Complete by: As directed ?  ? Diet Carb Modified   Complete by: As directed ?  ? Discharge instructions   Complete by: As directed ?  ? Advised to follow-up with primary care physician in 1 week. ?Patient reports feeling much better and want to be discharged. ?Advised to take prednisone 40 mg daily for 4 more days to complete 5-day treatment.  ? Increase activity slowly   Complete by: As directed ?  ? ?  ? ?Allergies as of 10/29/2021   ? ?   Reactions  ? Clindamycin/lincomycin Nausea And Vomiting  ? Penicillins Rash  ?   ? Spiriva [tiotropium Bromide Monohydrate]   ? General intolerance  ? Sulfa Antibiotics Hives, Rash  ? Severe Hives with skin reaction  ? ?  ? ?  ?Medication List  ?  ? ?STOP taking these medications   ? ?cholecalciferol 25 MCG (1000 UNIT) tablet ?Commonly known as: VITAMIN D3 ?  ?FLUoxetine 20 MG capsule ?Commonly known as: PROZAC ?  ?rosuvastatin 5 MG tablet ?Commonly known as: CRESTOR ?  ?vitamin B-12 1000 MCG tablet ?Commonly known as: CYANOCOBALAMIN ?  ?vitamin E 180 MG (400 UNITS)  capsule ?  ? ?  ? ?TAKE these medications   ? ?acetaminophen 325 MG tablet ?Commonly known as: TYLENOL ?Take 325 mg by mouth every 6 (six) hours as needed for moderate pain. Alternates with Ibuprofen ?  ?AeroChamber Plus inhaler ?Use as instructed ?  ?amLODipine 5 MG tablet ?Commonly known as: NORVASC ?TAKE 1 TABLET BY MOUTH AT BEDTIME . APPOINTMENT REQUIRED FOR FUTURE REFILLS ?  ?Breztri Aerosphere 160-9-4.8 MCG/ACT Aero ?Generic drug: Budeson-Glycopyrrol-Formoterol ?INHALE 2 PUFFS INTO THE LUNGS TWICE DAILY ?What changed:   ?when to take this ?reasons to take this ?  ?budesonide-formoterol 80-4.5 MCG/ACT inhaler ?Commonly known as: Symbicort ?Inhale 2 puffs into the lungs daily. ?  ?guaiFENesin 600 MG 12 hr tablet ?Commonly known as: [place] ?Take 1 tablet (600 mg total) by mouth 2 (two) times daily. ?  ?predniSONE 20 MG tablet ?Commonly known as: DELTASONE ?Take 2 tablets (40 mg total) by mouth daily with breakfast for 4 days. ?Start taking on: October 30, 2021 ?  ?ProAir HFA 108 (90 Base) MCG/ACT inhaler ?Generic drug: albuterol ?INHALE 2 PUFFS BY MOUTH EVERY 6 HOURS AS NEEDED FOR WHEEZING AND FOR SHORTNESS OF BREATH ?  ? ?  ? ? Follow-up Information   ? ? Caren Macadam, MD Follow up in 1 week(s).   ?Specialty: Family Medicine ?Contact information: ?[address] ?[place] [address] ?[phone] ? ? ?  ?  ? ?  ?  ? ?  ? ?Allergies  ?Allergen Reactions  ? Clindamycin/Lincomycin Nausea And Vomiting  ? Penicillins Rash  ?   ?  ? Spiriva [Tiotropium Bromide Monohydrate]   ?  General intolerance  ? Sulfa Antibiotics Hives and Rash  ?  Severe Hives with skin reaction  ? ? ?Consultations: ?None ? ? ?Procedures/Studies: ?CT Head Wo Contrast ? ?Result Date: 10/28/2021 ?CLINICAL DATA:  Acute delirium, confusion EXAM: CT HEAD WITHOUT CONTRAST TECHNIQUE: Contiguous axial images were obtained from the base of the skull through the vertex without intravenous contrast. RADIATION DOSE REDUCTION: This exam was performed according to the departmental dose-optimization program which includes automated exposure control, adjustment of the mA and/or kV according to patient size and/or use of iterative reconstruction technique. COMPARISON:  07/21/2020 FINDINGS: Brain: Stable moderate generalized brain atrophy and white matter microvascular ischemic changes throughout both cerebral hemispheres. No acute intracranial hemorrhage, new mass lesion, acute infarction, midline shift, herniation, hydrocephalus, or extra-axial fluid collection. No  focal mass effect or edema. Cisterns are patent. Cerebellar atrophy as well. Vascular: Intracranial atherosclerosis noted. No hyperdense vessel. Skull: Normal. Negative for fracture or focal lesion. Sinuses/Orbits: No acute finding. Other: None. IMPRESSION: Stable atrophy and white matter microvascular changes. No acute intracranial abnormality by noncontrast CT. Electronically Signed   By: Jerilynn Mages.  Shick M.D.   On: 10/28/2021 13:27  ? ?MR BRAIN WO CONTRAST ? ?Result Date: 10/28/2021 ?CLINICAL DATA:  Mental status change EXAM: MRI HEAD WITHOUT CONTRAST TECHNIQUE: Multiplanar, multiecho pulse sequences of the brain and surrounding structures were obtained without intravenous contrast. COMPARISON:  CT head 10/28/2021 FINDINGS: Brain: Negative for acute infarct.  Negative for hemorrhage or mass Moderate atrophy. Ventricular enlargement consistent with the degree of atrophy. Mild to moderate periventricular deep white matter hyperintensity bilaterally. Brainstem and cerebellum intact. Vascular: Normal arterial flow voids at the skull base. Tortuosity of the vertebrobasilar. Skull and upper cervical spine: No focal skeletal abnormality. Sinuses/Orbits: Paranasal sinuses clear. Bilateral cataract extraction Other: None IMPRESSION: Negative for acute infarct Moderate atrophy and mild-to-moderate chronic microvascular ischemic changes in the  white matter. No acute abnormality. Electronically Signed   By: Franchot Gallo M.D.   On: 10/28/2021 19:55  ? ?DG Chest Port 1 View ? ?Result Date: 10/28/2021 ?CLINICAL DATA:  Shortness of breath. EXAM: PORTABLE CHEST 1 VIEW COMPARISON:  Chest radiograph 07/20/2020; CT chest 08/06/2020 FINDINGS: Emphysematous changes in the upper lobes. Postoperative changes at the right mid lung. No focal consolidation, pleural effusion, or pneumothorax. Heart is normal in size. Aortic calcifications. IMPRESSION: No acute cardiopulmonary abnormality. Aortic Atherosclerosis (ICD10-I70.0) and Emphysema  (ICD10-J43.9). Electronically Signed   By: Ileana Roup M.D.   On: 10/28/2021 12:21   ? ? ?Subjective: ?Patient was seen and examined at bedside.  Overnight events noted.  Patient reports feeling better and want to be d

## 2021-10-29 NOTE — Discharge Instructions (Signed)
Advised to follow-up with primary care physician in 1 week. ?Patient reports feeling much better and want to be discharged. ?Advised to take prednisone 40 mg daily for 4 more days to complete 5-day treatment. ?

## 2021-10-29 NOTE — Progress Notes (Signed)
?  Transition of Care (TOC) Screening Note ? ? ?Patient Details  ?Name: [name] ?Date of Birth: Jun 23, 1941 ? ? ?Transition of Care (TOC) CM/SW Contact:    ?Cyndi Bender, RN ?Phone Number: ?10/29/2021, 8:38 AM ? ? ? ?Transition of Care Department [facility]) has reviewed patient. We will continue to monitor patient advancement through interdisciplinary progression rounds and therapy recommendations  ? ? ?

## 2021-10-29 NOTE — Care Management CC44 (Signed)
Condition Code 44 Documentation Completed ? ?Patient Details  ?Name: [name] ?MRN: [id number] ?Date of Birth: Aug 26, 1940 ? ? ?Condition Code 44 given:  Yes ?Patient signature on Condition Code 44 notice:  Yes ?Documentation of 2 MD's agreement:  Yes ?Code 44 added to claim:  Yes ? ? ? ?Cyndi Bender, RN ?10/29/2021, 8:57 AM ? ?

## 2021-10-29 NOTE — Progress Notes (Signed)
Patient ID: [name], female   DOB: [birth year], 81 y.o.   MRN: [id number] ? ?An After Visit Summary was printed and given to the patient. ? ?Patient education given on medication changes and follow up appointments.  ? ?The patient verbalizes understanding and acceptance of instructions per MD statement. ? ?[name] 10/29/2021 2:21 PM ? ?

## 2021-10-29 NOTE — Plan of Care (Signed)
?  Problem: Education: ?Goal: Knowledge of disease or condition will improve ?Outcome: Adequate for Discharge ?Goal: Knowledge of the prescribed therapeutic regimen will improve ?Outcome: Adequate for Discharge ?Goal: Individualized Educational Video(s) ?Outcome: Adequate for Discharge ?  ?Problem: Activity: ?Goal: Ability to tolerate increased activity will improve ?Outcome: Adequate for Discharge ?Goal: Will verbalize the importance of balancing activity with adequate rest periods ?Outcome: Adequate for Discharge ?  ?Problem: Respiratory: ?Goal: Ability to maintain a clear airway will improve ?Outcome: Adequate for Discharge ?Goal: Levels of oxygenation will improve ?Outcome: Adequate for Discharge ?Goal: Ability to maintain adequate ventilation will improve ?Outcome: Adequate for Discharge ? Patient ID: [name], female   DOB: [birth year], 81 y.o.   MRN: [id number] ? ? ?[name], RN ? ?

## 2021-10-29 NOTE — TOC Transition Note (Signed)
Transition of Care (TOC) - CM/SW Discharge Note ? ? ?Patient Details  ?Name: [name] ?MRN: [id number] ?Date of Birth: 06-05-1941 ? ?Transition of Care (TOC) CM/SW Contact:  ?Cyndi Bender, RN ?Phone Number: ?10/29/2021, 1:35 PM ? ? ?Clinical Narrative:    ?Spoke to patient and patient is disoriented to situation.   ?Attempted to reach husband and friend. Left VM on [name]'s phone. Husband isn't answering phone number [phone]. Attempted 3x to reach. Call non emergent police to do wellness check. ?Officer Nance called RNCM and husband was working on his truck. Husband is agreeable to pay for taxi for patient to come home. Patient is aware. RN will let RNCM know when patient had been given discharge instuctions. ?Address, Phone number and PCP verified.  ? ?  ?Barriers to Discharge: Continued Medical Work up ? ? ?Patient Goals and CMS Choice ?Patient states their goals for this hospitalization and ongoing recovery are:: go home ?  ?  ? ?Discharge Placement ?  ?           ? home ?  ?  ?  ? ?Discharge Plan and Services ?  ?  ?           ? home ?  ?  ?  ?  ?  ?  ?  ?  ?  ? ?Social Determinants of Health (SDOH) Interventions ?  ? ? ?Readmission Risk Interventions ?   ? View : No data to display.  ?  ?  ?  ? ? ? ? ? ?

## 2021-10-29 NOTE — Progress Notes (Signed)
Assessed discharge readiness and discharge lounge appropriateness. Patient has dementia and lives with husband. Have been unable to contact patient's husband who also has dementia. MD  and SW are both involved. ? ?

## 2021-10-29 NOTE — Care Management Obs Status (Addendum)
MEDICARE OBSERVATION STATUS NOTIFICATION ? ? ?Patient Details  ?Name: [name] ?MRN: [id number] ?Date of Birth: [birth year] ? ? ?Medicare Observation Status Notification Given:  Yes ? ?1252 at the time of this letter given the last documentation of the patient showed the patient was orientated X4. The patient was not oriented to situation. RNCM attempted to call spouse and friend with no answer. VM was left on friends house.  ? ? ?Cyndi Bender, RN ?10/29/2021, 8:57 AM ?

## 2021-10-29 NOTE — Progress Notes (Signed)
Patient admitted for COPD exacerbation. Arrived into the unit at about 0220am. Alert and oriented. Skin assessment done with another RN see flowsheet. Patient was placed on 2LNC  due to hypoxia. Will continue to monitor ?

## 2021-11-11 ENCOUNTER — Encounter (INDEPENDENT_AMBULATORY_CARE_PROVIDER_SITE_OTHER): Payer: Medicare Other | Admitting: Ophthalmology

## 2021-11-25 ENCOUNTER — Emergency Department (HOSPITAL_COMMUNITY): Payer: Medicare Other

## 2021-11-25 ENCOUNTER — Other Ambulatory Visit: Payer: Self-pay

## 2021-11-25 ENCOUNTER — Encounter (HOSPITAL_COMMUNITY): Payer: Self-pay

## 2021-11-25 ENCOUNTER — Emergency Department (HOSPITAL_COMMUNITY)
Admission: EM | Admit: 2021-11-25 | Discharge: 2021-11-25 | Disposition: A | Discharge status: Home / Self Care | Payer: Medicare Other | Attending: Emergency Medicine | Admitting: Emergency Medicine

## 2021-11-25 DIAGNOSIS — R790 Abnormal level of blood mineral: Secondary | ICD-10-CM | POA: Insufficient documentation

## 2021-11-25 DIAGNOSIS — J441 Chronic obstructive pulmonary disease with (acute) exacerbation: Secondary | ICD-10-CM

## 2021-11-25 DIAGNOSIS — Z85118 Personal history of other malignant neoplasm of bronchus and lung: Secondary | ICD-10-CM | POA: Diagnosis not present

## 2021-11-25 DIAGNOSIS — F0390 Unspecified dementia without behavioral disturbance: Secondary | ICD-10-CM | POA: Insufficient documentation

## 2021-11-25 DIAGNOSIS — Z7951 Long term (current) use of inhaled steroids: Secondary | ICD-10-CM | POA: Insufficient documentation

## 2021-11-25 DIAGNOSIS — Z743 Need for continuous supervision: Secondary | ICD-10-CM | POA: Diagnosis not present

## 2021-11-25 DIAGNOSIS — I499 Cardiac arrhythmia, unspecified: Secondary | ICD-10-CM | POA: Diagnosis not present

## 2021-11-25 DIAGNOSIS — R0602 Shortness of breath: Secondary | ICD-10-CM | POA: Diagnosis present

## 2021-11-25 DIAGNOSIS — R000 Tachycardia, unspecified: Secondary | ICD-10-CM | POA: Diagnosis not present

## 2021-11-25 DIAGNOSIS — J90 Pleural effusion, not elsewhere classified: Secondary | ICD-10-CM | POA: Diagnosis not present

## 2021-11-25 DIAGNOSIS — R6889 Other general symptoms and signs: Secondary | ICD-10-CM | POA: Diagnosis not present

## 2021-11-25 DIAGNOSIS — J439 Emphysema, unspecified: Secondary | ICD-10-CM | POA: Diagnosis not present

## 2021-11-25 LAB — CBC WITH DIFFERENTIAL/PLATELET
Abs Immature Granulocytes: 0.01 10*3/uL (ref 0.00–0.07)
Basophils Absolute: 0.1 10*3/uL (ref 0.0–0.1)
Basophils Relative: 2 %
Eosinophils Absolute: 0.3 10*3/uL (ref 0.0–0.5)
Eosinophils Relative: 5 %
HCT: 42.6 % (ref 36.0–46.0)
Hemoglobin: 14.6 g/dL (ref 12.0–15.0)
Immature Granulocytes: 0 %
Lymphocytes Relative: 18 %
Lymphs Abs: 1 10*3/uL (ref 0.7–4.0)
MCH: 33 pg (ref 26.0–34.0)
MCHC: 34.3 g/dL (ref 30.0–36.0)
MCV: 96.4 fL (ref 80.0–100.0)
Monocytes Absolute: 0.5 10*3/uL (ref 0.1–1.0)
Monocytes Relative: 9 %
Neutro Abs: 3.9 10*3/uL (ref 1.7–7.7)
Neutrophils Relative %: 66 %
Platelets: 178 10*3/uL (ref 150–400)
RBC: 4.42 MIL/uL (ref 3.87–5.11)
RDW: 12.1 % (ref 11.5–15.5)
WBC: 5.7 10*3/uL (ref 4.0–10.5)
nRBC: 0 % (ref 0.0–0.2)

## 2021-11-25 LAB — BASIC METABOLIC PANEL
Anion gap: 7 (ref 5–15)
BUN: 11 mg/dL (ref 8–23)
CO2: 27 mmol/L (ref 22–32)
Calcium: 9.4 mg/dL (ref 8.9–10.3)
Chloride: 102 mmol/L (ref 98–111)
Creatinine, Ser: 0.84 mg/dL (ref 0.44–1.00)
GFR, Estimated: >60 mL/min (ref >60)
Glucose, Bld: 87 mg/dL (ref 70–99)
Potassium: 3.9 mmol/L (ref 3.5–5.1)
Sodium: 136 mmol/L (ref 135–145)

## 2021-11-25 LAB — BRAIN NATRIURETIC PEPTIDE: B Natriuretic Peptide: 131.5 pg/mL — ABNORMAL HIGH (ref 0.0–100.0)

## 2021-11-25 LAB — TROPONIN I (HIGH SENSITIVITY): Troponin I (High Sensitivity): 16 ng/L (ref <18)

## 2021-11-25 MED ORDER — IPRATROPIUM-ALBUTEROL 0.5-2.5 (3) MG/3ML IN SOLN
3.0000 mL | Freq: Once | RESPIRATORY_TRACT | Status: AC
Start: 2021-11-25 — End: 2021-11-25
  Administered 2021-11-25: 3 mL via RESPIRATORY_TRACT
  Filled 2021-11-25: qty 3

## 2021-11-25 MED ORDER — ALBUTEROL SULFATE HFA 108 (90 BASE) MCG/ACT IN AERS
1.0000 | INHALATION_SPRAY | Freq: Once | RESPIRATORY_TRACT | Status: AC
  Administered 2021-11-25: 1 via RESPIRATORY_TRACT
  Filled 2021-11-25: qty 6.7

## 2021-11-25 NOTE — ED Provider Notes (Signed)
?[facility] DEPT ?Provider Note ? ? ?CSN: [id number] ?Arrival date & time: 11/25/21  0535 ? ?  ? ?History ? ?Chief Complaint  ?Patient presents with  ? Shortness of Breath  ? ? ?[name] is a 81 y.o. female. ? ?The history is provided by the patient, the EMS personnel and medical records. No language interpreter was used.  ?Shortness of Breath ? ?81 year old female significant history of dementia, COPD, lung cancer, brought here via EMS from home with complaints of shortness of breath.  Nursing notes indicate that patient has dementia and has difficulty giving history.  I was able to obtain history from patient without difficulty.  She mention for the past 2 to 3 days she did notice increased shortness of breath.  Shortness of breath tends to be worse at nighttime as well exertion and at rest she feels a bit better.  She does have risk inhaler at home and she has been using it 2-3 times a day but did not notice any significant improvement.  She denies having any fever chest pain shortness abdominal pain back pain no nausea vomiting diarrhea no worsening cough no hemoptysis.  She has not noticed any fluid retention.  No recent sick contact.  Patient lives at home with her husband.  She use a cane to walk. ? ?Home Medications ?Prior to Admission medications   ?Medication Sig Start Date End Date Taking? Authorizing Provider  ?acetaminophen (TYLENOL) 325 MG tablet Take 325 mg by mouth every 6 (six) hours as needed for moderate pain. Alternates with Ibuprofen    [provider]  ?amLODipine (NORVASC) 5 MG tablet TAKE 1 TABLET BY MOUTH AT BEDTIME . APPOINTMENT REQUIRED FOR FUTURE REFILLS ?Patient not taking: Reported on 10/28/2021 11/19/20   Caren Macadam, MD  ?BREZTRI AEROSPHERE 160-9-4.8 MCG/ACT AERO INHALE 2 PUFFS INTO THE LUNGS TWICE DAILY ?Patient taking differently: Inhale 2 puffs into the lungs daily as needed (For shortness of breath). 12/22/20   Brand Males, MD  ?budesonide-formoterol (SYMBICORT) 80-4.5 MCG/ACT inhaler Inhale 2 puffs into the lungs daily. 10/29/21   Shawna Clamp, MD  ?guaiFENesin (MUCINEX) 600 MG 12 hr tablet Take 1 tablet (600 mg total) by mouth 2 (two) times daily. 10/29/21   Shawna Clamp, MD  ?[facility] HFA 108 [phone] Base) MCG/ACT inhaler INHALE 2 PUFFS BY MOUTH EVERY 6 HOURS AS NEEDED FOR WHEEZING AND FOR SHORTNESS OF BREATH 02/06/21   Brand Males, MD  ?Spacer/Aero-Holding Chambers (AEROCHAMBER PLUS) inhaler Use as instructed 03/20/21   Caren Macadam, MD  ?   ? ?Allergies    ?Clindamycin/lincomycin, Penicillins, Spiriva [tiotropium bromide monohydrate], and Sulfa antibiotics   ? ?Review of Systems   ?Review of Systems  ?Respiratory:  Positive for shortness of breath.   ?All other systems reviewed and are negative. ? ?Physical Exam ?Updated Vital Signs ?BP (!) 178/115 (BP Location: Right Arm)   Pulse 73   Temp 97.6 ?F (36.4 ?C) (Oral)   Resp 18   Ht 5\' 2"  (1.575 m)   Wt 53 kg   SpO2 95%   BMI 21.37 kg/m?  ?Physical Exam ?Vitals and nursing note reviewed.  ?Constitutional:   ?   General: She is not in acute distress. ?   Appearance: She is well-developed.  ?   Comments: Patient laying in bed resting comfortably appears to be in no acute respiratory discomfort  ?HENT:  ?   Head: Atraumatic.  ?Eyes:  ?   Conjunctiva/sclera: Conjunctivae normal.  ?Cardiovascular:  ?  Rate and Rhythm: Normal rate and regular rhythm.  ?Pulmonary:  ?   Effort: Pulmonary effort is normal.  ?   Breath sounds: Wheezing (Minimal wheezes heard) present. No decreased breath sounds, rhonchi or rales.  ?Chest:  ?   Chest wall: No tenderness.  ?Musculoskeletal:  ?   Cervical back: Neck supple.  ?   Right lower leg: No edema.  ?   Left lower leg: No edema.  ?   Comments: Moving all 4 extremities  ?Skin: ?   Findings: No rash.  ?Neurological:  ?   Mental Status: She is alert and oriented to person, place, and time.  ?Psychiatric:     ?   Mood and Affect: Mood  normal.  ? ? ?ED Results / Procedures / Treatments   ?Labs ?(all labs ordered are listed, but only abnormal results are displayed) ?Labs Reviewed  ?BRAIN NATRIURETIC PEPTIDE - Abnormal; Notable for the following components:  ?    Result Value  ? B Natriuretic Peptide 131.5 (*)   ? All other components within normal limits  ?CBC WITH DIFFERENTIAL/PLATELET  ?BASIC METABOLIC PANEL  ?TROPONIN I (HIGH SENSITIVITY)  ?TROPONIN I (HIGH SENSITIVITY)  ? ? ?EKG ?EKG Interpretation ? ?Date/Time:  Wednesday November 25 2021 05:46:23 EDT ?Ventricular Rate:  82 ?PR Interval:    ?QRS Duration: 91 ?QT Interval:  368 ?QTC Calculation: 425 ?R Axis:   96 ?Text Interpretation: poor data quality unclear underlying? rhythm but suspect most likely sinus Confirmed by Madalyn Rob [phone]) on 11/25/2021 9:54:18 AM ? ?Radiology ?DG Chest 2 View ? ?Result Date: 11/25/2021 ?CLINICAL DATA:  81 year old female with history of shortness of breath. EXAM: CHEST - 2 VIEW COMPARISON:  Chest x-ray 10/28/2021. FINDINGS: Lungs are hyperexpanded with extensive emphysematous changes. Suture line in the right mid lung, likely from prior wedge resection. No acute consolidative airspace disease. Blunting of both costophrenic sulci, suggesting trace bilateral pleural effusions. No pneumothorax. No evidence of pulmonary edema. Heart size is borderline enlarged. Upper mediastinal contours are within normal limits allowing for slight patient rotation to the right. Atherosclerotic calcifications in the thoracic aorta. IMPRESSION: 1. Trace bilateral pleural effusions. 2. Aortic atherosclerosis. 3. Emphysema. Electronically Signed   By: Vinnie Langton M.D.   On: 11/25/2021 07:05   ? ?Procedures ?Procedures  ? ? ?Medications Ordered in ED ?Medications  ?albuterol (VENTOLIN HFA) 108 (90 Base) MCG/ACT inhaler 1-2 puff (has no administration in time range)  ?ipratropium-albuterol (DUONEB) 0.5-2.5 (3) MG/3ML nebulizer solution 3 mL (3 mLs Nebulization Given 11/25/21 0601)   ? ? ?ED Course/ Medical Decision Making/ A&P ?  ?                        ?Medical Decision Making ?Amount and/or Complexity of Data Reviewed ?Labs: ordered. ?Radiology: ordered. ? ?Risk ?Prescription drug management. ? ? ?BP (!) 162/107   Pulse 79   Temp 97.6 ?F (36.4 ?C) (Oral)   Resp 17   Ht 5\' 2"  (1.575 m)   Wt 53 kg   SpO2 94%   BMI 21.37 kg/m?  ? ?7:24 AM ?This is a 81 year old female with significant history of COPD presenting with complaints of shortness of breath.  Symptoms ongoing for the past 2 to 3 days worse when she exerts herself or when she lays down.  She was initially brought here via EMS and did receive a DuoNeb and report improvement of her symptoms.  At this time she is resting comfortably, appears to be  in no acute respiratory discomfort, answering questions appropriately and is alert and oriented x3.  Vital sign reveals a blood pressure of 162/107.  O2 sat is 94% on room air.  Plan to have patient ambulate and check O2.  Suspect COPD exacerbation. care discussed with Dr. Roslynn Amble ? ?9:53 AM ?When ambulatory O2 sat is above 93% on room air.  Labs, EKG, imaging obtained independently viewed interpreted by me and are reassuring.  Mildly elevated BNP however improved from prior.  Normal troponin.  EKG without concerning ischemic changes, chest x-ray shows minimal pleural effusion with no evidence of pneumonia. ? ?At this time patient stable for discharge.  Patient will go home with albuterol inhaler to use as needed and outpatient follow-up with PCP.  Return precaution given. ? ? ?This patient presents to the ED for concern of SOB, this involves an extensive number of treatment options, and is a complaint that carries with it a high risk of complications and morbidity.  The differential diagnosis includes COPD exacerbation, CHF, PE, PNA, OSA, ACS, reactive airway disease, asthma ? ?Co morbidities that complicate the patient evaluation ?COPD ? Lung CA ?Additional history  obtained: ? ?Additional history obtained from EMS ?External records from outside source obtained and reviewed including previous hospital admission on 10/28/21 for COPD exacerbation.  ? ?Lab Tests: ? ?I Ordered, and personally interpreted lab

## 2021-11-25 NOTE — ED Notes (Signed)
Patient maintained O2 sats between 93-95% while ambulating, did not report feeling any dizziness or unsteadiness. Patient desatted to 89% upon returning to bed, however returned to 93%, RN aware. ?

## 2021-11-25 NOTE — ED Notes (Signed)
Blue and gold tops sent down to lab. ?

## 2021-11-25 NOTE — ED Notes (Signed)
Patient transported to X-ray 

## 2021-11-25 NOTE — ED Provider Triage Note (Signed)
Emergency Medicine Provider Triage Evaluation Note ? ?[name] , a 81 y.o. female  was evaluated in triage.  Pt complains of SHOB, onset this morning, brought in by EMS from home. Does not use nebulizer at home. ? ?Review of Systems  ?Positive: SHOB, wheezing ?Negative: CP, lower ext edema ? ?Physical Exam  ?BP (!) 178/115 (BP Location: Right Arm)   Pulse 73   Temp 97.6 ?F (36.4 ?C) (Oral)   Resp 18   Ht 5\' 2"  (1.575 m)   Wt 53 kg   SpO2 95%   BMI 21.37 kg/m?  ?Gen:   Awake, no distress   ?Resp:  Normal effort  ?MSK:   Moves extremities without difficulty  ?Other:  Mild exp wheezing ? ?Medical Decision Making  ?Medically screening exam initiated at 5:55 AM.  Appropriate orders placed.  Clemie General was informed that the remainder of the evaluation will be completed by another provider, this initial triage assessment does not replace that evaluation, and the importance of remaining in the ED until their evaluation is complete. ? ?Mild exp wheezing, will order duoneb, CXR, basic labs.  ?  ?Tacy Learn, PA-C ?11/25/21 0556 ? ?

## 2021-11-25 NOTE — ED Notes (Signed)
[facility] called for transport back home ?

## 2021-11-25 NOTE — Discharge Instructions (Addendum)
You may use your albuterol inhaler 2 puffs every 4 hours as needed for shortness of breath.  Follow-up with your doctor for further care.  Return if your conditions worsen. ?

## 2021-11-25 NOTE — ED Notes (Signed)
Spoke with Eliz Nigg who is patient's daughter and POA. She stated that patient should be transported via [place].  ?

## 2021-11-25 NOTE — ED Triage Notes (Signed)
Patient BIB [facility] from home. Called out because shortness of breath for 2 days. She does not know what medications she takes. She has dementia. Oxygen sats have been 98% en route. BP 160/110. History of COPD.  ?

## 2021-12-02 ENCOUNTER — Encounter (INDEPENDENT_AMBULATORY_CARE_PROVIDER_SITE_OTHER): Payer: Medicare Other | Admitting: Ophthalmology

## 2021-12-07 ENCOUNTER — Encounter (INDEPENDENT_AMBULATORY_CARE_PROVIDER_SITE_OTHER): Payer: Medicare Other | Admitting: Ophthalmology

## 2021-12-11 DIAGNOSIS — Z139 Encounter for screening, unspecified: Secondary | ICD-10-CM | POA: Diagnosis not present

## 2021-12-14 ENCOUNTER — Encounter (INDEPENDENT_AMBULATORY_CARE_PROVIDER_SITE_OTHER): Payer: Medicare Other | Admitting: Ophthalmology

## 2021-12-25 ENCOUNTER — Emergency Department (HOSPITAL_BASED_OUTPATIENT_CLINIC_OR_DEPARTMENT_OTHER): Payer: Medicare Other

## 2021-12-25 ENCOUNTER — Emergency Department (HOSPITAL_COMMUNITY): Payer: Medicare Other

## 2021-12-25 ENCOUNTER — Emergency Department (HOSPITAL_COMMUNITY)
Admission: EM | Admit: 2021-12-25 | Discharge: 2021-12-25 | Disposition: A | Discharge status: Home / Self Care | Payer: Medicare Other | Attending: Emergency Medicine | Admitting: Emergency Medicine

## 2021-12-25 ENCOUNTER — Other Ambulatory Visit: Payer: Self-pay

## 2021-12-25 DIAGNOSIS — S8992XA Unspecified injury of left lower leg, initial encounter: Secondary | ICD-10-CM | POA: Diagnosis present

## 2021-12-25 DIAGNOSIS — Z79899 Other long term (current) drug therapy: Secondary | ICD-10-CM | POA: Insufficient documentation

## 2021-12-25 DIAGNOSIS — Z743 Need for continuous supervision: Secondary | ICD-10-CM | POA: Diagnosis not present

## 2021-12-25 DIAGNOSIS — R233 Spontaneous ecchymoses: Secondary | ICD-10-CM

## 2021-12-25 DIAGNOSIS — M7989 Other specified soft tissue disorders: Secondary | ICD-10-CM | POA: Insufficient documentation

## 2021-12-25 DIAGNOSIS — R6889 Other general symptoms and signs: Secondary | ICD-10-CM | POA: Diagnosis not present

## 2021-12-25 DIAGNOSIS — I10 Essential (primary) hypertension: Secondary | ICD-10-CM | POA: Insufficient documentation

## 2021-12-25 DIAGNOSIS — X58XXXA Exposure to other specified factors, initial encounter: Secondary | ICD-10-CM | POA: Diagnosis not present

## 2021-12-25 DIAGNOSIS — R0602 Shortness of breath: Secondary | ICD-10-CM | POA: Diagnosis not present

## 2021-12-25 DIAGNOSIS — R062 Wheezing: Secondary | ICD-10-CM | POA: Diagnosis not present

## 2021-12-25 DIAGNOSIS — S8012XA Contusion of left lower leg, initial encounter: Secondary | ICD-10-CM | POA: Diagnosis not present

## 2021-12-25 DIAGNOSIS — J441 Chronic obstructive pulmonary disease with (acute) exacerbation: Secondary | ICD-10-CM

## 2021-12-25 DIAGNOSIS — I499 Cardiac arrhythmia, unspecified: Secondary | ICD-10-CM | POA: Diagnosis not present

## 2021-12-25 DIAGNOSIS — G8929 Other chronic pain: Secondary | ICD-10-CM | POA: Diagnosis not present

## 2021-12-25 LAB — CBC WITH DIFFERENTIAL/PLATELET
Abs Immature Granulocytes: 0.01 10*3/uL (ref 0.00–0.07)
Basophils Absolute: 0.1 10*3/uL (ref 0.0–0.1)
Basophils Relative: 1 %
Eosinophils Absolute: 0.1 10*3/uL (ref 0.0–0.5)
Eosinophils Relative: 1 %
HCT: 42.2 % (ref 36.0–46.0)
Hemoglobin: 13.7 g/dL (ref 12.0–15.0)
Immature Granulocytes: 0 %
Lymphocytes Relative: 19 %
Lymphs Abs: 1.2 10*3/uL (ref 0.7–4.0)
MCH: 31.3 pg (ref 26.0–34.0)
MCHC: 32.5 g/dL (ref 30.0–36.0)
MCV: 96.3 fL (ref 80.0–100.0)
Monocytes Absolute: 0.3 10*3/uL (ref 0.1–1.0)
Monocytes Relative: 5 %
Neutro Abs: 4.6 10*3/uL (ref 1.7–7.7)
Neutrophils Relative %: 74 %
Platelets: 162 10*3/uL (ref 150–400)
RBC: 4.38 MIL/uL (ref 3.87–5.11)
RDW: 12.1 % (ref 11.5–15.5)
WBC: 6.2 10*3/uL (ref 4.0–10.5)
nRBC: 0 % (ref 0.0–0.2)

## 2021-12-25 LAB — COMPREHENSIVE METABOLIC PANEL
ALT: 21 U/L (ref 0–44)
AST: 25 U/L (ref 15–41)
Albumin: 4 g/dL (ref 3.5–5.0)
Alkaline Phosphatase: 60 U/L (ref 38–126)
Anion gap: 9 (ref 5–15)
BUN: 14 mg/dL (ref 8–23)
CO2: 24 mmol/L (ref 22–32)
Calcium: 9.2 mg/dL (ref 8.9–10.3)
Chloride: 104 mmol/L (ref 98–111)
Creatinine, Ser: 1.02 mg/dL — ABNORMAL HIGH (ref 0.44–1.00)
GFR, Estimated: 56 mL/min — ABNORMAL LOW (ref >60)
Glucose, Bld: 118 mg/dL — ABNORMAL HIGH (ref 70–99)
Potassium: 3.8 mmol/L (ref 3.5–5.1)
Sodium: 137 mmol/L (ref 135–145)
Total Bilirubin: 1.1 mg/dL (ref 0.3–1.2)
Total Protein: 6.3 g/dL — ABNORMAL LOW (ref 6.5–8.1)

## 2021-12-25 LAB — TROPONIN I (HIGH SENSITIVITY)
Troponin I (High Sensitivity): 23 ng/L — ABNORMAL HIGH (ref <18)
Troponin I (High Sensitivity): 23 ng/L — ABNORMAL HIGH (ref <18)

## 2021-12-25 LAB — D-DIMER, QUANTITATIVE: D-Dimer, Quant: 0.43 ug/mL-FEU (ref 0.00–0.50)

## 2021-12-25 LAB — BRAIN NATRIURETIC PEPTIDE: B Natriuretic Peptide: 178.8 pg/mL — ABNORMAL HIGH (ref 0.0–100.0)

## 2021-12-25 MED ORDER — ALBUTEROL SULFATE HFA 108 (90 BASE) MCG/ACT IN AERS
2.0000 | INHALATION_SPRAY | Freq: Once | RESPIRATORY_TRACT | Status: AC
  Administered 2021-12-25: 2 via RESPIRATORY_TRACT
  Filled 2021-12-25: qty 6.7

## 2021-12-25 MED ORDER — PREDNISONE 20 MG PO TABS: 40.0000 mg | ORAL_TABLET | Freq: Every day | ORAL | 0 refills | Status: AC

## 2021-12-25 MED ORDER — PROAIR HFA 108 (90 BASE) MCG/ACT IN AERS: INHALATION_SPRAY | RESPIRATORY_TRACT | 0 refills | Status: AC

## 2021-12-25 MED ORDER — BREZTRI AEROSPHERE 160-9-4.8 MCG/ACT IN AERO: 2.0000 | INHALATION_SPRAY | Freq: Two times a day (BID) | RESPIRATORY_TRACT | 0 refills | Status: DC

## 2021-12-25 NOTE — ED Triage Notes (Signed)
Pt here via [facility] from home for shob that started this morning. A neighbor came over and put pt on O2, she uses home O2 "sometimes". Per EMS pt was diminished in all lung fields, they gave 15mg  albuterol neb, 125mg  solumedrol and pt started having wheezing, then gave 1.5 mg atrovent neb. Hx COPD, pt is awaiting placement at assisted living. GCS 15, 163/103, 84HR, 98% on 2L O2 via nasal cannula. 20g LAC.

## 2021-12-25 NOTE — Discharge Instructions (Addendum)
If you develop new or worsening shortness of breath, fever, chest pain, coughing up blood, or any other new/concerning symptoms then return to the ER for evaluation.  Use the ProAir/albuterol inhaler 2 puffs every 4-6 hours as needed for cough or shortness of breath or wheezing.  Start the prednisone tomorrow.

## 2021-12-25 NOTE — ED Provider Notes (Signed)
[facility] EMERGENCY DEPARTMENT Provider Note   CSN: [id number] Arrival date & time: 12/25/21  1528     History  Chief Complaint  Patient presents with   Shortness of Breath    [name] is a 81 y.o. female.  HPI 81 year old female with a history of COPD not on home oxygen, hypertension, hyperlipidemia and depression presents with shortness of breath.  Acutely started a few hours ago.  Was brought in by EMS and was given 15 mg albuterol, 125 mg Solu-Medrol and 1.5 mg Atrovent.  She is feeling a lot better after these treatments.  She states she had a little bit of a cough when this started but no chest pain, chest tightness or leg swelling.  However when she woke up her left lower extremity was purple and she does remember any type of injury.  She is not on blood thinners.  No recent travel or history of blood clot.  Home Medications Prior to Admission medications   Medication Sig Start Date End Date Taking? Authorizing Provider  acetaminophen (TYLENOL) 325 MG tablet Take 325 mg by mouth every 6 (six) hours as needed for moderate pain. Alternates with Ibuprofen    [provider]  amLODipine (NORVASC) 5 MG tablet TAKE 1 TABLET BY MOUTH AT BEDTIME . APPOINTMENT REQUIRED FOR FUTURE REFILLS Patient not taking: Reported on 10/28/2021 11/19/20   Caren Macadam, MD  BREZTRI AEROSPHERE 160-9-4.8 MCG/ACT AERO INHALE 2 PUFFS INTO THE LUNGS TWICE DAILY Patient taking differently: Inhale 2 puffs into the lungs daily as needed (For shortness of breath). 12/22/20   Brand Males, MD  budesonide-formoterol (SYMBICORT) 80-4.5 MCG/ACT inhaler Inhale 2 puffs into the lungs daily. 10/29/21   Shawna Clamp, MD  guaiFENesin (MUCINEX) 600 MG 12 hr tablet Take 1 tablet (600 mg total) by mouth 2 (two) times daily. 10/29/21   Shawna Clamp, MD  PROAIR HFA 108 [phone] Base) MCG/ACT inhaler INHALE 2 PUFFS BY MOUTH EVERY 6 HOURS AS NEEDED FOR WHEEZING AND FOR SHORTNESS OF  BREATH 02/06/21   Brand Males, MD  Spacer/Aero-Holding Chambers (AEROCHAMBER PLUS) inhaler Use as instructed 03/20/21   Caren Macadam, MD      Allergies    Clindamycin/lincomycin, Penicillins, Spiriva [tiotropium bromide monohydrate], and Sulfa antibiotics    Review of Systems   Review of Systems  Constitutional:  Negative for fever.  Respiratory:  Positive for cough and shortness of breath. Negative for chest tightness.   Cardiovascular:  Negative for chest pain.  Skin:  Positive for color change.   Physical Exam Updated Vital Signs BP (!) 171/97   Pulse 94   Temp 98.4 F (36.9 C) (Axillary)   Resp (!) 23   SpO2 100%  Physical Exam Vitals and nursing note reviewed.  Constitutional:      Appearance: She is well-developed.     Comments: Currently finishing up a breathing treatment  HENT:     Head: Normocephalic and atraumatic.  Cardiovascular:     Rate and Rhythm: Regular rhythm. Tachycardia present.     Pulses:          Dorsalis pedis pulses are 2+ on the right side and 2+ on the left side.     Heart sounds: Normal heart sounds.     Comments: HR ~ 100 Pulmonary:     Effort: Pulmonary effort is normal. No tachypnea, accessory muscle usage or respiratory distress.     Breath sounds: Normal breath sounds.     Comments: Perhaps mild  rhonchi but for the most part lungs sound pretty clear diffusely Abdominal:     Palpations: Abdomen is soft.     Tenderness: There is no abdominal tenderness.  Musculoskeletal:     Comments: No edema in BLE. However her left lower leg has a sizable area of ecchymosis. It is not tender. No calf tenderness.   Skin:    General: Skin is warm and dry.  Neurological:     Mental Status: She is alert.    ED Results / Procedures / Treatments   Labs (all labs ordered are listed, but only abnormal results are displayed) Labs Reviewed  COMPREHENSIVE METABOLIC PANEL - Abnormal; Notable for the following components:      Result Value    Glucose, Bld 118 (*)    Creatinine, Ser 1.02 (*)    Total Protein 6.3 (*)    GFR, Estimated 56 (*)    All other components within normal limits  BRAIN NATRIURETIC PEPTIDE - Abnormal; Notable for the following components:   B Natriuretic Peptide 178.8 (*)    All other components within normal limits  TROPONIN I (HIGH SENSITIVITY) - Abnormal; Notable for the following components:   Troponin I (High Sensitivity) 23 (*)    All other components within normal limits  TROPONIN I (HIGH SENSITIVITY) - Abnormal; Notable for the following components:   Troponin I (High Sensitivity) 23 (*)    All other components within normal limits  CBC WITH DIFFERENTIAL/PLATELET  D-DIMER, QUANTITATIVE    EKG EKG Interpretation  Date/Time:  Friday Dec 25 2021 15:30:12 EDT Ventricular Rate:  107 PR Interval:    QRS Duration: 98 QT Interval:  345 QTC Calculation: 432 R Axis:   -88 Text Interpretation: likely sinus rhythm with premature beats Left anterior fascicular block  overall interpretation limited by poor data quality Confirmed by Sherwood Gambler [phone]) on 12/25/2021 3:44:27 PM  Radiology DG Chest Portable 1 View  Result Date: 12/25/2021 CLINICAL DATA:  Shortness of breath. EXAM: PORTABLE CHEST 1 VIEW COMPARISON:  11/25/2021; 10/28/2021; 07/20/2020; 05/21/2016; chest CT-08/06/2020 FINDINGS: Grossly unchanged enlarged cardiac silhouette and mediastinal contours with atherosclerotic plaque within the thoracic aorta. Stable prominence of the central pulmonary vasculature. The lungs remain hyperexpanded with flattening the bilaterally diaphragms. Stable postoperative change of the peripheral aspect the right upper lung. No pleural effusion or pneumothorax. No evidence of edema. No acute osseous abnormalities. Degenerative change of the bilateral glenohumeral joints is suspected though incompletely evaluated. IMPRESSION: 1. Similar findings of cardiomegaly and lung hyperexpansion without superimposed acute  cardiopulmonary disease. 2. Stable postoperative change of the right upper lung. Electronically Signed   By: Sandi Mariscal M.D.   On: 12/25/2021 17:15   VAS [place] LOWER EXTREMITY VENOUS (DVT) (7a-7p)  Result Date: 12/25/2021  Lower Venous DVT Study Patient Name:  EDNAH HAMMOCK [facility]  Date of Exam:   12/25/2021 Medical Rec #: [id number]             Accession #:    [id number] Date of Birth: November 24, 1940            Patient Gender: F Patient Age:   15 years Exam Location:  [facility] Procedure:      VAS [place] LOWER EXTREMITY VENOUS (DVT) Referring Phys: Nicki Reaper Erdem Naas --------------------------------------------------------------------------------  Indications: Swelling, ecchymosis left leg, patient endorses no trauma.  Comparison Study: No prior studies. Performing Technologist: Darlin Coco RDMS, RVT  Examination Guidelines: A complete evaluation includes B-mode imaging, spectral Doppler, color Doppler, and power Doppler as needed of all  accessible portions of each vessel. Bilateral testing is considered an integral part of a complete examination. Limited examinations for reoccurring indications may be performed as noted. The reflux portion of the exam is performed with the patient in reverse Trendelenburg.  +-----+---------------+---------+-----------+----------+--------------+ RIGHTCompressibilityPhasicitySpontaneityPropertiesThrombus Aging +-----+---------------+---------+-----------+----------+--------------+ CFV  Full           Yes      Yes                                 +-----+---------------+---------+-----------+----------+--------------+   +---------+---------------+---------+-----------+----------+--------------+ LEFT     CompressibilityPhasicitySpontaneityPropertiesThrombus Aging +---------+---------------+---------+-----------+----------+--------------+ CFV      Full           Yes      Yes                                  +---------+---------------+---------+-----------+----------+--------------+ SFJ      Full                                                        +---------+---------------+---------+-----------+----------+--------------+ FV Prox  Full                                                        +---------+---------------+---------+-----------+----------+--------------+ FV Mid   Full                                                        +---------+---------------+---------+-----------+----------+--------------+ FV DistalFull                                                        +---------+---------------+---------+-----------+----------+--------------+ PFV      Full                                                        +---------+---------------+---------+-----------+----------+--------------+ POP      Full           Yes      Yes                                 +---------+---------------+---------+-----------+----------+--------------+ PTV      Full                                                        +---------+---------------+---------+-----------+----------+--------------+ PERO     Full                                                        +---------+---------------+---------+-----------+----------+--------------+  Gastroc  Full                                                        +---------+---------------+---------+-----------+----------+--------------+     Summary: RIGHT: - No evidence of common femoral vein obstruction.  LEFT: - There is no evidence of deep vein thrombosis in the lower extremity.  - No cystic structure found in the popliteal fossa.  *See table(s) above for measurements and observations. Electronically signed by Jamelle Haring on 12/25/2021 at 5:18:56 PM.    Final     Procedures Procedures    Medications Ordered in ED Medications  albuterol (VENTOLIN HFA) 108 (90 Base) MCG/ACT inhaler 2 puff (2 puffs Inhalation Given 12/25/21  2029)    ED Course/ Medical Decision Making/ A&P                           Medical Decision Making Problems Addressed: COPD exacerbation [facility]): acute illness or injury that poses a threat to life or bodily functions  Amount and/or Complexity of Data Reviewed External Data Reviewed: notes. Labs: ordered. Radiology: ordered and independent interpretation performed. ECG/medicine tests: ordered and independent interpretation performed.  Risk Prescription drug management.   Patient presents with recurrent COPD exacerbation.  Seems to be much better after the EMS treatments.  Here she has not had any recurrent symptoms.  Given the seemingly spontaneous bruising to her left lower extremity, DVT ultrasound was obtained.  This is negative.  D-dimer is also negative.  Given this I highly doubt PE.  X-ray was viewed by myself and I do not see any pneumonia.  Labs show normal WBC but her troponin is slightly elevated but flat at 23.  This is likely not from ACS, especially as she has had mildly elevated troponins in the past.  I doubt this is a CHF.  Has a nonspecific BNP.  EKG does not show any obvious ischemia on my interpretation.  She has no increased work of breathing currently.  She was able to ambulate and her sats stayed above 88% which is reasonable for someone with chronic COPD.  At this point, I think she is stable for discharge.  I did briefly talk to her stepdaughter and she notes that the patient has been inadvertently taking her Judithann Sauger and has run out and would like a repeat prescription.  This will be sent as well as an albuterol prescription just in case she do not have that.  We will give a short course of prednisone.  I do not think she needs antibiotics.  Will discharge home with return precautions.        Final Clinical Impression(s) / ED Diagnoses Final diagnoses:  COPD exacerbation [facility])    Rx / DC Orders ED Discharge Orders     None         Sherwood Gambler,  MD 12/25/21 2039

## 2021-12-25 NOTE — ED Notes (Addendum)
Assisted pt to ambulate in room with pulse oximetry. Pt sats 88% during this activity and is dyspneic with exertion. Quickly improved with rest to 93%. States that she feels at her baseline throughout.   Pt neighbor calls and states that there are memory concerns than would make it unsafe for this patient to take a cab home. Pt neighbor is in [place] and cannot help. Her emergency contact is also out of town.

## 2021-12-25 NOTE — ED Notes (Signed)
[facility] contacted, ride scheduled back to her home

## 2021-12-25 NOTE — Progress Notes (Signed)
Lower extremity venous left study completed.  Preliminary results relayed to Regenia Skeeter, MD.  See CV Proc for preliminary results report.   Darlin Coco, RDMS, RVT

## 2021-12-29 ENCOUNTER — Telehealth: Payer: Self-pay | Admitting: *Deleted

## 2021-12-29 NOTE — Telephone Encounter (Signed)
Pharmacy called related to Rx: Proair HFA not being covered by insurance; asked to change to name brand .Marland KitchenMarland KitchenEDCM clarified with EDP to change Rx to: name brand as suggested by Pharm D.

## 2021-12-31 ENCOUNTER — Encounter (HOSPITAL_COMMUNITY): Payer: Self-pay

## 2021-12-31 ENCOUNTER — Emergency Department (HOSPITAL_COMMUNITY): Payer: Medicare Other

## 2021-12-31 ENCOUNTER — Emergency Department (HOSPITAL_COMMUNITY)
Admission: EM | Admit: 2021-12-31 | Discharge: 2022-01-01 | Disposition: A | Discharge status: Home / Self Care | Payer: Medicare Other | Attending: Emergency Medicine | Admitting: Emergency Medicine

## 2021-12-31 ENCOUNTER — Other Ambulatory Visit: Payer: Self-pay

## 2021-12-31 DIAGNOSIS — R799 Abnormal finding of blood chemistry, unspecified: Secondary | ICD-10-CM | POA: Insufficient documentation

## 2021-12-31 DIAGNOSIS — F0390 Unspecified dementia without behavioral disturbance: Secondary | ICD-10-CM | POA: Insufficient documentation

## 2021-12-31 DIAGNOSIS — Z20822 Contact with and (suspected) exposure to covid-19: Secondary | ICD-10-CM | POA: Diagnosis not present

## 2021-12-31 DIAGNOSIS — J449 Chronic obstructive pulmonary disease, unspecified: Secondary | ICD-10-CM | POA: Diagnosis not present

## 2021-12-31 DIAGNOSIS — R0602 Shortness of breath: Secondary | ICD-10-CM | POA: Diagnosis not present

## 2021-12-31 DIAGNOSIS — R6889 Other general symptoms and signs: Secondary | ICD-10-CM | POA: Diagnosis not present

## 2021-12-31 DIAGNOSIS — R519 Headache, unspecified: Secondary | ICD-10-CM | POA: Insufficient documentation

## 2021-12-31 DIAGNOSIS — R202 Paresthesia of skin: Secondary | ICD-10-CM | POA: Diagnosis not present

## 2021-12-31 DIAGNOSIS — R410 Disorientation, unspecified: Secondary | ICD-10-CM | POA: Diagnosis not present

## 2021-12-31 DIAGNOSIS — R5383 Other fatigue: Secondary | ICD-10-CM | POA: Diagnosis not present

## 2021-12-31 DIAGNOSIS — Z743 Need for continuous supervision: Secondary | ICD-10-CM | POA: Diagnosis not present

## 2021-12-31 DIAGNOSIS — R079 Chest pain, unspecified: Secondary | ICD-10-CM | POA: Diagnosis not present

## 2021-12-31 DIAGNOSIS — R0789 Other chest pain: Secondary | ICD-10-CM | POA: Diagnosis not present

## 2021-12-31 LAB — COMPREHENSIVE METABOLIC PANEL
ALT: 24 U/L (ref 0–44)
AST: 22 U/L (ref 15–41)
Albumin: 4.1 g/dL (ref 3.5–5.0)
Alkaline Phosphatase: 66 U/L (ref 38–126)
Anion gap: 10 (ref 5–15)
BUN: 9 mg/dL (ref 8–23)
CO2: 25 mmol/L (ref 22–32)
Calcium: 9.5 mg/dL (ref 8.9–10.3)
Chloride: 102 mmol/L (ref 98–111)
Creatinine, Ser: 1.1 mg/dL — ABNORMAL HIGH (ref 0.44–1.00)
GFR, Estimated: 51 mL/min — ABNORMAL LOW (ref >60)
Glucose, Bld: 112 mg/dL — ABNORMAL HIGH (ref 70–99)
Potassium: 4.8 mmol/L (ref 3.5–5.1)
Sodium: 137 mmol/L (ref 135–145)
Total Bilirubin: 1 mg/dL (ref 0.3–1.2)
Total Protein: 6.7 g/dL (ref 6.5–8.1)

## 2021-12-31 LAB — TROPONIN I (HIGH SENSITIVITY)
Troponin I (High Sensitivity): 22 ng/L — ABNORMAL HIGH (ref <18)
Troponin I (High Sensitivity): 20 ng/L — ABNORMAL HIGH (ref <18)

## 2021-12-31 LAB — CBC WITH DIFFERENTIAL/PLATELET
Abs Immature Granulocytes: 0.03 10*3/uL (ref 0.00–0.07)
Basophils Absolute: 0.1 10*3/uL (ref 0.0–0.1)
Basophils Relative: 2 %
Eosinophils Absolute: 0.1 10*3/uL (ref 0.0–0.5)
Eosinophils Relative: 1 %
HCT: 46.6 % — ABNORMAL HIGH (ref 36.0–46.0)
Hemoglobin: 15.3 g/dL — ABNORMAL HIGH (ref 12.0–15.0)
Immature Granulocytes: 0 %
Lymphocytes Relative: 18 %
Lymphs Abs: 1.3 10*3/uL (ref 0.7–4.0)
MCH: 31.6 pg (ref 26.0–34.0)
MCHC: 32.8 g/dL (ref 30.0–36.0)
MCV: 96.3 fL (ref 80.0–100.0)
Monocytes Absolute: 0.5 10*3/uL (ref 0.1–1.0)
Monocytes Relative: 7 %
Neutro Abs: 5.4 10*3/uL (ref 1.7–7.7)
Neutrophils Relative %: 72 %
Platelets: 186 10*3/uL (ref 150–400)
RBC: 4.84 MIL/uL (ref 3.87–5.11)
RDW: 12 % (ref 11.5–15.5)
WBC: 7.3 10*3/uL (ref 4.0–10.5)
nRBC: 0 % (ref 0.0–0.2)

## 2021-12-31 LAB — I-STAT ARTERIAL BLOOD GAS, ED
Acid-base deficit: 2 mmol/L (ref 0.0–2.0)
Bicarbonate: 21.3 mmol/L (ref 20.0–28.0)
Calcium, Ion: 1.22 mmol/L (ref 1.15–1.40)
HCT: 43 % (ref 36.0–46.0)
Hemoglobin: 14.6 g/dL (ref 12.0–15.0)
O2 Saturation: 97 %
Patient temperature: 98.3
Potassium: 3.7 mmol/L (ref 3.5–5.1)
Sodium: 135 mmol/L (ref 135–145)
TCO2: 22 mmol/L (ref 22–32)
pCO2 arterial: 33.3 mmHg (ref 32–48)
pH, Arterial: 7.414 (ref 7.35–7.45)
pO2, Arterial: 83 mmHg (ref 83–108)

## 2021-12-31 LAB — RESP PANEL BY RT-PCR (FLU A&B, COVID) ARPGX2
Influenza A by PCR: NEGATIVE
Influenza B by PCR: NEGATIVE
SARS Coronavirus 2 by RT PCR: NEGATIVE

## 2021-12-31 LAB — BRAIN NATRIURETIC PEPTIDE: B Natriuretic Peptide: 129 pg/mL — ABNORMAL HIGH (ref 0.0–100.0)

## 2021-12-31 LAB — D-DIMER, QUANTITATIVE: D-Dimer, Quant: 0.69 ug/mL-FEU — ABNORMAL HIGH (ref 0.00–0.50)

## 2021-12-31 LAB — CBG MONITORING, ED: Glucose-Capillary: 119 mg/dL — ABNORMAL HIGH (ref 70–99)

## 2021-12-31 MED ORDER — ACETAMINOPHEN 500 MG PO TABS
1000.0000 mg | ORAL_TABLET | Freq: Once | ORAL | Status: AC
Start: 2021-12-31 — End: 2021-12-31
  Administered 2021-12-31: 1000 mg via ORAL
  Filled 2021-12-31: qty 2

## 2021-12-31 MED ORDER — BREZTRI AEROSPHERE 160-9-4.8 MCG/ACT IN AERO: 2.0000 | INHALATION_SPRAY | Freq: Two times a day (BID) | RESPIRATORY_TRACT | 0 refills | Status: AC

## 2021-12-31 NOTE — ED Provider Notes (Signed)
[facility] EMERGENCY DEPARTMENT Provider Note   CSN: [id number] Arrival date & time: 12/31/21  1814     History  Chief Complaint  Patient presents with   SOB   Fatigue   Chills   Headache    [name] is a 81 y.o. female.  HPI 81 year old female presents with shortness of breath.  History is primarily from the nurses spoke to EMS but more from the daughter-in-law over the phone who is also her POA [name]).  Patient has significant and worsening dementia.  She has been having episodes of shortness of breath which required her to go to the hospital and then she gets discharged.  Sound like she had another episode where she told her husband (who also has dementia) she was not doing well and he told the neighbor who called EMS.  It seems like the primary concern was shortness of breath.  She is supposed to be on oxygen but does not wear due to her memory issues.  She has not been on her meds in a while.  She is actually getting ready to go to a facility with her husband tomorrow.  Daughter is hoping she will get more stable care through the physician there.  Otherwise, the patient denies any complaints currently.  Home Medications Prior to Admission medications   Medication Sig Start Date End Date Taking? Authorizing Provider  acetaminophen (TYLENOL) 325 MG tablet Take 325 mg by mouth every 6 (six) hours as needed for moderate pain. Alternates with Ibuprofen    [provider]  amLODipine (NORVASC) 5 MG tablet TAKE 1 TABLET BY MOUTH AT BEDTIME . APPOINTMENT REQUIRED FOR FUTURE REFILLS Patient not taking: Reported on 10/28/2021 11/19/20   Caren Macadam, MD  Budeson-Glycopyrrol-Formoterol (BREZTRI AEROSPHERE) 160-9-4.8 MCG/ACT AERO Inhale 2 puffs into the lungs 2 (two) times daily. 12/31/21   Sherwood Gambler, MD  budesonide-formoterol [facility]) 80-4.5 MCG/ACT inhaler Inhale 2 puffs into the lungs daily. 10/29/21   Shawna Clamp, MD   guaiFENesin (MUCINEX) 600 MG 12 hr tablet Take 1 tablet (600 mg total) by mouth 2 (two) times daily. 10/29/21   Shawna Clamp, MD  PROAIR HFA 108 [phone] Base) MCG/ACT inhaler INHALE 2 PUFFS BY MOUTH EVERY 6 HOURS AS NEEDED FOR WHEEZING AND FOR SHORTNESS OF BREATH 12/25/21   Sherwood Gambler, MD  Spacer/Aero-Holding Chambers (AEROCHAMBER PLUS) inhaler Use as instructed 03/20/21   Caren Macadam, MD      Allergies    Clindamycin/lincomycin, Penicillins, Spiriva [tiotropium bromide monohydrate], and Sulfa antibiotics    Review of Systems   Review of Systems  Unable to perform ROS: Dementia   Physical Exam Updated Vital Signs BP (!) 140/111   Pulse (!) 59   Temp 98.3 F (36.8 C) (Oral)   Resp 18   SpO2 91%  Physical Exam Vitals and nursing note reviewed.  Constitutional:      Appearance: She is well-developed.  HENT:     Head: Normocephalic and atraumatic.  Cardiovascular:     Rate and Rhythm: Normal rate and regular rhythm.     Heart sounds: Normal heart sounds.  Pulmonary:     Effort: Pulmonary effort is normal.     Breath sounds: Normal breath sounds. No wheezing.  Abdominal:     Palpations: Abdomen is soft.     Tenderness: There is no abdominal tenderness.  Musculoskeletal:     Cervical back: No rigidity.     Right lower leg: No edema.  Left lower leg: No edema.  Skin:    General: Skin is warm and dry.  Neurological:     Mental Status: She is alert. She is disoriented.     Comments: Awake, alert, oriented to person and place but disoriented to time. CN 3-12 grossly intact. 5/5 strength in all 4 extremities. Grossly normal sensation.  Normal finger to nose/    ED Results / Procedures / Treatments   Labs (all labs ordered are listed, but only abnormal results are displayed) Labs Reviewed  COMPREHENSIVE METABOLIC PANEL - Abnormal; Notable for the following components:      Result Value   Glucose, Bld 112 (*)    Creatinine, Ser 1.10 (*)    GFR, Estimated 51 (*)     All other components within normal limits  BRAIN NATRIURETIC PEPTIDE - Abnormal; Notable for the following components:   B Natriuretic Peptide 129.0 (*)    All other components within normal limits  CBC WITH DIFFERENTIAL/PLATELET - Abnormal; Notable for the following components:   Hemoglobin 15.3 (*)    HCT 46.6 (*)    All other components within normal limits  D-DIMER, QUANTITATIVE - Abnormal; Notable for the following components:   D-Dimer, Quant 0.69 (*)    All other components within normal limits  CBG MONITORING, ED - Abnormal; Notable for the following components:   Glucose-Capillary 119 (*)    All other components within normal limits  TROPONIN I (HIGH SENSITIVITY) - Abnormal; Notable for the following components:   Troponin I (High Sensitivity) 22 (*)    All other components within normal limits  TROPONIN I (HIGH SENSITIVITY) - Abnormal; Notable for the following components:   Troponin I (High Sensitivity) 20 (*)    All other components within normal limits  RESP PANEL BY RT-PCR (FLU A&B, COVID) [id number]  URINALYSIS, ROUTINE W REFLEX MICROSCOPIC  I-STAT ARTERIAL BLOOD GAS, ED    EKG EKG Interpretation  Date/Time:  Thursday Dec 31 2021 20:43:36 EDT Ventricular Rate:  111 PR Interval:  152 QRS Duration: 92 QT Interval:  357 QTC Calculation: 437 R Axis:   133 Text Interpretation: Sinus tachycardia Multiform ventricular premature complexes Right axis deviation Probable anteroseptal infarct, old Confirmed by Sherwood Gambler [phone]) on 12/31/2021 8:58:23 PM  Radiology CT Head Wo Contrast  Result Date: 12/31/2021 CLINICAL DATA:  Altered mental status EXAM: CT HEAD WITHOUT CONTRAST TECHNIQUE: Contiguous axial images were obtained from the base of the skull through the vertex without intravenous contrast. RADIATION DOSE REDUCTION: This exam was performed according to the departmental dose-optimization program which includes automated exposure control, adjustment of the mA  and/or kV according to patient size and/or use of iterative reconstruction technique. COMPARISON:  CT brain and MRI brain 10/28/2021, CT brain 07/21/2020 FINDINGS: Brain: No acute territorial infarction, hemorrhage or intracranial mass. Moderate atrophy. Mild chronic small vessel ischemic changes of the white matter. Stable ventricle size Vascular: No hyperdense vessels. 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 Carotid vascular calcification Skull: Normal. Negative for fracture or focal lesion. Sinuses/Orbits: No acute finding. Other: None IMPRESSION: 1. No CT evidence for acute intracranial abnormality. 2. Atrophy and chronic small vessel ischemic changes of the white matter Electronically Signed   By: Donavan Foil M.D.   On: 12/31/2021 22:54   DG Chest Portable 1 View  Result Date: 12/31/2021 CLINICAL DATA:  Shortness of breath, fatigue EXAM: PORTABLE CHEST 1 VIEW COMPARISON:  12/25/2021 FINDINGS: There is hyperinflation of the lungs compatible with COPD. Heart and mediastinal contours are  within normal limits. No focal opacities or effusions. No acute bony abnormality. IMPRESSION: COPD.  No active disease. Electronically Signed   By: Rolm Baptise M.D.   On: 12/31/2021 19:12    Procedures Procedures    Medications Ordered in ED Medications  acetaminophen (TYLENOL) tablet 1,000 mg (1,000 mg Oral Given 12/31/21 2045)    ED Course/ Medical Decision Making/ A&P                           Medical Decision Making Amount and/or Complexity of Data Reviewed Independent Historian:     Details: Daughter Labs: ordered. Radiology: ordered and independent interpretation performed. ECG/medicine tests: ordered and independent interpretation performed.  Risk OTC drugs. Prescription drug management.   Patient presents with acute shortness of breath though she is not short of breath currently.  Seems like she had multiple similar visits.  No wheezing noted on multiple exams.  Chest x-ray  image reviewed by myself and no pneumonia or pneumothorax.  She complained of a headache after being here and states that acutely started so a head CT was obtained though her neuro exam remained normal besides disorientation.  CT head images viewed by myself and there is no head bleed or swelling.  Labs overall unremarkable besides minimally elevated troponin that is flat for her.  D-dimer is elevated but age-adjusted negative.  At this point, unclear what caused her transient dyspnea but it is now gone.  Discussed this again with the daughter and let her know about the negative results.  Stable for discharge home to follow-up with PCP and she is going into the facility tomorrow.  Is not currently requiring oxygen.        Final Clinical Impression(s) / ED Diagnoses Final diagnoses:  Shortness of breath    Rx / DC Orders ED Discharge Orders          Ordered    Budeson-Glycopyrrol-Formoterol (BREZTRI AEROSPHERE) 160-9-4.8 MCG/ACT AERO  2 times daily        12/31/21 1938              Sherwood Gambler, MD 12/31/21 2308

## 2021-12-31 NOTE — ED Triage Notes (Signed)
Pt BIB [facility] from her home with c/o SOB, weakness, chills & fatigue. Upon arrival to ED she was GCS of 15 with moments of confusion. Report she feels worm but her temp upon arrival was 98.3 oral. 170/108.

## 2021-12-31 NOTE — Discharge Instructions (Addendum)
If you develop fever, trouble breathing, cough, chest pain, or any other new/concerning symptoms then return to the ER for evaluation.

## 2022-01-01 NOTE — ED Notes (Signed)
Daughter transporting patient out of facility at this time. Discharge instructions reviewed, denies questions

## 2022-01-01 NOTE — ED Notes (Addendum)
[facility] arrives to transport patient, states that they are not able to transport a dementia patient home to another dementia patient and declines to transport. Emergency contact lives in [place]. Per contact, pt has placement scheduled today for both herself and husband with an ALF.  [facility] transport canceled and TOC consult placed.

## 2022-01-01 NOTE — ED Notes (Signed)
Pt daughter reached via phone, states she is driving from 4 hrs away to move her parents to an ALF. Informed her that [facility] declined to transport a confused patient home to be alone with another confused patient. Pt daughter states she will try to coordinate either herself or the gentleman helping move her parents to come pick [name] up from the hospital.

## 2022-01-01 NOTE — ED Notes (Signed)
Pt's daughter called and informed this RN that she'll be here to pick up the pt around noon. Breakfast tray ordered.

## 2022-01-07 DIAGNOSIS — E785 Hyperlipidemia, unspecified: Secondary | ICD-10-CM | POA: Diagnosis not present

## 2022-01-07 DIAGNOSIS — M8580 Other specified disorders of bone density and structure, unspecified site: Secondary | ICD-10-CM | POA: Diagnosis not present

## 2022-01-07 DIAGNOSIS — D649 Anemia, unspecified: Secondary | ICD-10-CM | POA: Diagnosis not present

## 2022-01-07 DIAGNOSIS — M1990 Unspecified osteoarthritis, unspecified site: Secondary | ICD-10-CM | POA: Diagnosis not present

## 2022-01-07 DIAGNOSIS — J449 Chronic obstructive pulmonary disease, unspecified: Secondary | ICD-10-CM | POA: Diagnosis not present

## 2022-01-07 DIAGNOSIS — I10 Essential (primary) hypertension: Secondary | ICD-10-CM | POA: Diagnosis not present

## 2022-01-07 DIAGNOSIS — F32A Depression, unspecified: Secondary | ICD-10-CM | POA: Diagnosis not present

## 2022-01-08 DIAGNOSIS — F03B0 Unspecified dementia, moderate, without behavioral disturbance, psychotic disturbance, mood disturbance, and anxiety: Secondary | ICD-10-CM | POA: Diagnosis not present

## 2022-01-08 DIAGNOSIS — D649 Anemia, unspecified: Secondary | ICD-10-CM | POA: Diagnosis not present

## 2022-01-08 DIAGNOSIS — I10 Essential (primary) hypertension: Secondary | ICD-10-CM | POA: Diagnosis not present

## 2022-01-08 DIAGNOSIS — E119 Type 2 diabetes mellitus without complications: Secondary | ICD-10-CM | POA: Diagnosis not present

## 2022-01-08 DIAGNOSIS — J449 Chronic obstructive pulmonary disease, unspecified: Secondary | ICD-10-CM | POA: Diagnosis not present

## 2022-01-10 ENCOUNTER — Other Ambulatory Visit: Payer: Self-pay

## 2022-01-10 ENCOUNTER — Emergency Department (HOSPITAL_COMMUNITY): Payer: Medicare Other

## 2022-01-10 ENCOUNTER — Encounter (HOSPITAL_COMMUNITY): Payer: Self-pay

## 2022-01-10 ENCOUNTER — Emergency Department (HOSPITAL_COMMUNITY)
Admission: EM | Admit: 2022-01-10 | Discharge: 2022-01-11 | Disposition: A | Discharge status: Skilled Nursing Facility | Payer: Medicare Other | Attending: Emergency Medicine | Admitting: Emergency Medicine

## 2022-01-10 DIAGNOSIS — I10 Essential (primary) hypertension: Secondary | ICD-10-CM | POA: Diagnosis not present

## 2022-01-10 DIAGNOSIS — E876 Hypokalemia: Secondary | ICD-10-CM | POA: Insufficient documentation

## 2022-01-10 DIAGNOSIS — J449 Chronic obstructive pulmonary disease, unspecified: Secondary | ICD-10-CM | POA: Diagnosis not present

## 2022-01-10 DIAGNOSIS — R000 Tachycardia, unspecified: Secondary | ICD-10-CM | POA: Insufficient documentation

## 2022-01-10 DIAGNOSIS — I499 Cardiac arrhythmia, unspecified: Secondary | ICD-10-CM | POA: Diagnosis not present

## 2022-01-10 DIAGNOSIS — Z7951 Long term (current) use of inhaled steroids: Secondary | ICD-10-CM | POA: Insufficient documentation

## 2022-01-10 DIAGNOSIS — R0789 Other chest pain: Secondary | ICD-10-CM | POA: Diagnosis not present

## 2022-01-10 DIAGNOSIS — Z743 Need for continuous supervision: Secondary | ICD-10-CM | POA: Diagnosis not present

## 2022-01-10 DIAGNOSIS — F0390 Unspecified dementia without behavioral disturbance: Secondary | ICD-10-CM | POA: Diagnosis not present

## 2022-01-10 DIAGNOSIS — R0602 Shortness of breath: Secondary | ICD-10-CM

## 2022-01-10 DIAGNOSIS — R079 Chest pain, unspecified: Secondary | ICD-10-CM

## 2022-01-10 DIAGNOSIS — R6889 Other general symptoms and signs: Secondary | ICD-10-CM | POA: Diagnosis not present

## 2022-01-10 DIAGNOSIS — I4891 Unspecified atrial fibrillation: Secondary | ICD-10-CM | POA: Diagnosis not present

## 2022-01-10 LAB — CBC
HCT: 41.1 % (ref 36.0–46.0)
Hemoglobin: 14 g/dL (ref 12.0–15.0)
MCH: 32.3 pg (ref 26.0–34.0)
MCHC: 34.1 g/dL (ref 30.0–36.0)
MCV: 94.7 fL (ref 80.0–100.0)
Platelets: 212 10*3/uL (ref 150–400)
RBC: 4.34 MIL/uL (ref 3.87–5.11)
RDW: 11.4 % — ABNORMAL LOW (ref 11.5–15.5)
WBC: 7 10*3/uL (ref 4.0–10.5)
nRBC: 0 % (ref 0.0–0.2)

## 2022-01-10 LAB — BASIC METABOLIC PANEL
Anion gap: 9 (ref 5–15)
BUN: 10 mg/dL (ref 8–23)
CO2: 27 mmol/L (ref 22–32)
Calcium: 8.9 mg/dL (ref 8.9–10.3)
Chloride: 101 mmol/L (ref 98–111)
Creatinine, Ser: 0.79 mg/dL (ref 0.44–1.00)
GFR, Estimated: >60 mL/min (ref >60)
Glucose, Bld: 119 mg/dL — ABNORMAL HIGH (ref 70–99)
Potassium: 3.4 mmol/L — ABNORMAL LOW (ref 3.5–5.1)
Sodium: 137 mmol/L (ref 135–145)

## 2022-01-10 LAB — TROPONIN I (HIGH SENSITIVITY): Troponin I (High Sensitivity): 18 ng/L — ABNORMAL HIGH (ref <18)

## 2022-01-10 MED ORDER — ACETAMINOPHEN 500 MG PO TABS
1000.0000 mg | ORAL_TABLET | Freq: Once | ORAL | Status: AC
  Administered 2022-01-11: 1000 mg via ORAL
  Filled 2022-01-10: qty 2

## 2022-01-10 MED ORDER — POTASSIUM CHLORIDE CRYS ER 20 MEQ PO TBCR
20.0000 meq | EXTENDED_RELEASE_TABLET | Freq: Once | ORAL | Status: AC
Start: 2022-01-10 — End: 2022-01-10
  Administered 2022-01-10: 20 meq via ORAL
  Filled 2022-01-10: qty 1

## 2022-01-10 NOTE — ED Triage Notes (Signed)
Patient brought in from [place] complaining of chest pain and sob.  Patient has advanced dementia. Sats initially 90% on 3L [place] came up to 98%

## 2022-01-10 NOTE — ED Notes (Signed)
The pt  denies any pain or sob alert co-operative so far hx of dementia

## 2022-01-10 NOTE — ED Notes (Signed)
Received verbal report from Chris C RN at this time ?

## 2022-01-10 NOTE — ED Notes (Signed)
Educated pt on using the call bell and to not get out of bed without someone in there to help her. Also placed pt on bed alarm at this time

## 2022-01-10 NOTE — Discharge Instructions (Signed)
Your chest x-ray and lab work are reassuring today.  Your EKG also looked unchanged from prior. Please follow-up with your primary care doctor soon as possible.

## 2022-01-10 NOTE — ED Notes (Signed)
Report called to [facility] to a christina. [facility] is on the way to transport

## 2022-01-10 NOTE — ED Provider Notes (Signed)
[facility] EMERGENCY DEPARTMENT Provider Note   CSN: [id number] Arrival date & time: 01/10/22  1857     History  Chief Complaint  Patient presents with   Chest Pain    [name] is a 81 y.o. female with a history of HTN, COPD, HLD, dementia presenting to the ED with reported chest pain and shortness of breath.  On my evaluation of the patient, patient denies any symptoms and states that she feels fine and does not know why she is here.  She states that she always feels short of breath due to her COPD, but currently she feels fine.  Per the triage note, EMS had reported that she was complaining of chest pain and shortness of breath.  Her oxygen saturations were initially 90% on room air and on 3 L increased to 98%.  She currently lives at [facility].  Patient denies any cough, fever, current chest pain, current shortness of breath, abdominal pain, or any other symptoms.   Chest Pain Associated symptoms: shortness of breath (resolved)   Associated symptoms: no abdominal pain, no cough, no fever, no nausea and no vomiting       Home Medications Prior to Admission medications   Medication Sig Start Date End Date Taking? Authorizing Provider  acetaminophen (TYLENOL) 325 MG tablet Take 325 mg by mouth every 6 (six) hours as needed for moderate pain. Alternates with Ibuprofen    [provider]  amLODipine (NORVASC) 5 MG tablet TAKE 1 TABLET BY MOUTH AT BEDTIME . APPOINTMENT REQUIRED FOR FUTURE REFILLS Patient not taking: Reported on 10/28/2021 11/19/20   Caren Macadam, MD  Budeson-Glycopyrrol-Formoterol (BREZTRI AEROSPHERE) 160-9-4.8 MCG/ACT AERO Inhale 2 puffs into the lungs 2 (two) times daily. 12/31/21   Sherwood Gambler, MD  budesonide-formoterol [facility]) 80-4.5 MCG/ACT inhaler Inhale 2 puffs into the lungs daily. 10/29/21   Shawna Clamp, MD  guaiFENesin (MUCINEX) 600 MG 12 hr tablet Take 1 tablet (600 mg total) by mouth 2 (two)  times daily. 10/29/21   Shawna Clamp, MD  PROAIR HFA 108 [phone] Base) MCG/ACT inhaler INHALE 2 PUFFS BY MOUTH EVERY 6 HOURS AS NEEDED FOR WHEEZING AND FOR SHORTNESS OF BREATH 12/25/21   Sherwood Gambler, MD  Spacer/Aero-Holding Chambers (AEROCHAMBER PLUS) inhaler Use as instructed 03/20/21   Caren Macadam, MD      Allergies    Clindamycin/lincomycin, Penicillins, Spiriva [tiotropium bromide monohydrate], and Sulfa antibiotics    Review of Systems   Review of Systems  Constitutional:  Negative for fever.  Respiratory:  Positive for shortness of breath (resolved). Negative for cough.   Cardiovascular:  Positive for chest pain (resolved).  Gastrointestinal:  Negative for abdominal pain, nausea and vomiting.   Physical Exam Updated Vital Signs BP (!) 150/99   Pulse (!) 102   Temp 98.7 F (37.1 C) (Oral)   Resp 18   SpO2 93%  Physical Exam Constitutional:      General: She is not in acute distress.    Appearance: She is not ill-appearing, toxic-appearing or diaphoretic.     Comments: Elderly chronically ill-appearing.  HENT:     Head: Normocephalic and atraumatic.  Cardiovascular:     Rate and Rhythm: Tachycardia present. Rhythm irregular.     Pulses:          Radial pulses are 2+ on the right side and 2+ on the left side.     Heart sounds: Normal heart sounds. No murmur heard.   No friction rub. No  gallop.  Pulmonary:     Effort: Pulmonary effort is normal. No tachypnea or accessory muscle usage.     Breath sounds: Normal breath sounds. No stridor. No decreased breath sounds, wheezing, rhonchi or rales.  Abdominal:     Palpations: Abdomen is soft.     Tenderness: There is no abdominal tenderness. There is no guarding or rebound.  Musculoskeletal:     Cervical back: Neck supple.     Right lower leg: No tenderness. No edema.     Left lower leg: No tenderness. No edema.  Skin:    General: Skin is warm and dry.  Neurological:     Mental Status: She is alert.     Comments:  Is at her baseline.  She does have advanced dementia so she is a poor historian.    ED Results / Procedures / Treatments   Labs (all labs ordered are listed, but only abnormal results are displayed) Labs Reviewed  BASIC METABOLIC PANEL - Abnormal; Notable for the following components:      Result Value   Potassium 3.4 (*)    Glucose, Bld 119 (*)    All other components within normal limits  CBC - Abnormal; Notable for the following components:   RDW 11.4 (*)    All other components within normal limits  TROPONIN I (HIGH SENSITIVITY) - Abnormal; Notable for the following components:   Troponin I (High Sensitivity) 18 (*)    All other components within normal limits    EKG EKG Interpretation  Date/Time:  Sunday January 10 2022 19:08:59 EDT Ventricular Rate:  100 PR Interval:  140 QRS Duration: 76 QT Interval:  314 QTC Calculation: 405 R Axis:   82 Text Interpretation: Sinus rhythm with occasional Premature ventricular complexes Anterior infarct , age undetermined Abnormal ECG When compared with ECG of 31-Dec-2021 20:43, PREVIOUS ECG IS PRESENT since last tracing no significant change Confirmed by Noemi Chapel [phone]) on 01/10/2022 7:19:23 PM  Radiology DG Chest 2 View  Result Date: 01/10/2022 CLINICAL DATA:  Chest pain. EXAM: CHEST - 2 VIEW COMPARISON:  Dec 31, 2021 FINDINGS: The heart size and mediastinal contours are within normal limits. The lungs are hyperinflated. Multiple surgical sutures are seen overlying the lateral aspect of the mid right lung. There is no evidence of acute infiltrate, pleural effusion or pneumothorax. No acute osseous abnormalities are identified. IMPRESSION: COPD without active cardiopulmonary disease. Electronically Signed   By: Virgina Norfolk M.D.   On: 01/10/2022 20:38    Procedures Procedures    Medications Ordered in ED Medications  potassium chloride SA (KLOR-CON M) CR tablet 20 mEq (has no administration in time range)    ED Course/  Medical Decision Making/ A&P                           Medical Decision Making Amount and/or Complexity of Data Reviewed Labs: ordered. Radiology: ordered.  Risk Prescription drug management.   [name] is a 81 y.o. female with a history of HTN, COPD, HLD, dementia presenting to the ED with reported chest pain and shortness of breath.  On exam, the patient is afebrile, mildly tachycardic into the low 100s. Her oxygen saturations are 90-93% on room air.  She is a comfortable appearing and has clear lung sounds in all fields.  She currently denies any symptoms.  Patient does have advanced dementia and so is a very poor historian.  I did attempt to call [facility]  at [facility] x2 without any answer so I am unable to obtain further history from her facility.  Differential diagnosis includes COPD exacerbation, pneumonia, ACS, musculoskeletal chest pain.  ECG was obtained and showed a sinus rhythm with occasional PVCs, no acute ischemic changes, largely unchanged from prior.  Doubt COPD exacerbation given patient has not had a productive cough and has clear breath sounds on my examination today.  We will obtain baseline labs including CBC, BMP, and a single troponin.  Chest x-ray was obtained and did not show any acute abnormalities.  CBC is unremarkable.  BMP with mild hypokalemia to 3.4 so patient was given p.o. repletion.  Her troponin is very mildly elevated to 18.  However on chart review, it appears her troponin is always mildly elevated around 20.  Given that she has been asymptomatic since arrival and has no acute changes in her ECG, I do not think that a repeat troponin is required I feel she is safe for discharge.  I did speak with the patient's POA, Almyra Free, and discussed the findings of the patient's work-up today.  She is comfortable with discharge back to [facility].  Strict return precautions were discussed and the patient was discharged home in stable  condition.        Final Clinical Impression(s) / ED Diagnoses Final diagnoses:  Chest pain, unspecified type  Shortness of breath    Rx / DC Orders ED Discharge Orders     None         Sondra Come, MD 01/10/22 2218    Noemi Chapel, MD 01/11/22 1242

## 2022-01-10 NOTE — ED Notes (Signed)
Humberto Seals (daughter) called and would like an update on the patient whenever there is a chance. Phone is [phone].

## 2022-01-11 DIAGNOSIS — Z7401 Bed confinement status: Secondary | ICD-10-CM | POA: Diagnosis not present

## 2022-01-11 DIAGNOSIS — R0789 Other chest pain: Secondary | ICD-10-CM | POA: Diagnosis not present

## 2022-01-11 DIAGNOSIS — R404 Transient alteration of awareness: Secondary | ICD-10-CM | POA: Diagnosis not present

## 2022-01-11 DIAGNOSIS — R079 Chest pain, unspecified: Secondary | ICD-10-CM | POA: Diagnosis not present

## 2022-01-11 DIAGNOSIS — Z743 Need for continuous supervision: Secondary | ICD-10-CM | POA: Diagnosis not present

## 2022-01-11 NOTE — ED Notes (Signed)
Pt c/o severe headache spoke with ED provider reference same and received order for tylenol and admin at this time

## 2022-01-11 NOTE — ED Notes (Signed)
[facility] arrived for transport back to [facility]

## 2022-01-12 DIAGNOSIS — J449 Chronic obstructive pulmonary disease, unspecified: Secondary | ICD-10-CM | POA: Diagnosis not present

## 2022-01-12 DIAGNOSIS — E119 Type 2 diabetes mellitus without complications: Secondary | ICD-10-CM | POA: Diagnosis not present

## 2022-01-12 DIAGNOSIS — D649 Anemia, unspecified: Secondary | ICD-10-CM | POA: Diagnosis not present

## 2022-01-12 DIAGNOSIS — I10 Essential (primary) hypertension: Secondary | ICD-10-CM | POA: Diagnosis not present

## 2022-01-12 DIAGNOSIS — F03B0 Unspecified dementia, moderate, without behavioral disturbance, psychotic disturbance, mood disturbance, and anxiety: Secondary | ICD-10-CM | POA: Diagnosis not present

## 2022-01-14 DIAGNOSIS — E119 Type 2 diabetes mellitus without complications: Secondary | ICD-10-CM | POA: Diagnosis not present

## 2022-01-14 DIAGNOSIS — I10 Essential (primary) hypertension: Secondary | ICD-10-CM | POA: Diagnosis not present

## 2022-01-14 DIAGNOSIS — F03B0 Unspecified dementia, moderate, without behavioral disturbance, psychotic disturbance, mood disturbance, and anxiety: Secondary | ICD-10-CM | POA: Diagnosis not present

## 2022-01-14 DIAGNOSIS — D649 Anemia, unspecified: Secondary | ICD-10-CM | POA: Diagnosis not present

## 2022-01-14 DIAGNOSIS — J449 Chronic obstructive pulmonary disease, unspecified: Secondary | ICD-10-CM | POA: Diagnosis not present

## 2022-01-15 ENCOUNTER — Other Ambulatory Visit: Payer: Self-pay

## 2022-01-15 ENCOUNTER — Encounter (HOSPITAL_COMMUNITY): Payer: Self-pay

## 2022-01-15 ENCOUNTER — Emergency Department (HOSPITAL_COMMUNITY)
Admission: EM | Admit: 2022-01-15 | Discharge: 2022-01-15 | Disposition: A | Discharge status: Skilled Nursing Facility | Payer: Medicare Other | Attending: Emergency Medicine | Admitting: Emergency Medicine

## 2022-01-15 ENCOUNTER — Emergency Department (HOSPITAL_COMMUNITY): Payer: Medicare Other

## 2022-01-15 DIAGNOSIS — Z96653 Presence of artificial knee joint, bilateral: Secondary | ICD-10-CM | POA: Insufficient documentation

## 2022-01-15 DIAGNOSIS — R0902 Hypoxemia: Secondary | ICD-10-CM | POA: Diagnosis not present

## 2022-01-15 DIAGNOSIS — J449 Chronic obstructive pulmonary disease, unspecified: Secondary | ICD-10-CM | POA: Insufficient documentation

## 2022-01-15 DIAGNOSIS — Z85118 Personal history of other malignant neoplasm of bronchus and lung: Secondary | ICD-10-CM | POA: Insufficient documentation

## 2022-01-15 DIAGNOSIS — Z96643 Presence of artificial hip joint, bilateral: Secondary | ICD-10-CM | POA: Diagnosis not present

## 2022-01-15 DIAGNOSIS — R0602 Shortness of breath: Secondary | ICD-10-CM | POA: Insufficient documentation

## 2022-01-15 DIAGNOSIS — F0390 Unspecified dementia without behavioral disturbance: Secondary | ICD-10-CM | POA: Insufficient documentation

## 2022-01-15 DIAGNOSIS — Z743 Need for continuous supervision: Secondary | ICD-10-CM | POA: Diagnosis not present

## 2022-01-15 DIAGNOSIS — I10 Essential (primary) hypertension: Secondary | ICD-10-CM | POA: Insufficient documentation

## 2022-01-15 DIAGNOSIS — Z7951 Long term (current) use of inhaled steroids: Secondary | ICD-10-CM | POA: Diagnosis not present

## 2022-01-15 DIAGNOSIS — R069 Unspecified abnormalities of breathing: Secondary | ICD-10-CM | POA: Diagnosis not present

## 2022-01-15 DIAGNOSIS — R404 Transient alteration of awareness: Secondary | ICD-10-CM | POA: Diagnosis not present

## 2022-01-15 DIAGNOSIS — J439 Emphysema, unspecified: Secondary | ICD-10-CM | POA: Diagnosis not present

## 2022-01-15 DIAGNOSIS — R531 Weakness: Secondary | ICD-10-CM | POA: Diagnosis not present

## 2022-01-15 LAB — BASIC METABOLIC PANEL
Anion gap: 9 (ref 5–15)
BUN: 13 mg/dL (ref 8–23)
CO2: 26 mmol/L (ref 22–32)
Calcium: 8.6 mg/dL — ABNORMAL LOW (ref 8.9–10.3)
Chloride: 106 mmol/L (ref 98–111)
Creatinine, Ser: 0.78 mg/dL (ref 0.44–1.00)
GFR, Estimated: >60 mL/min (ref >60)
Glucose, Bld: 105 mg/dL — ABNORMAL HIGH (ref 70–99)
Potassium: 3.2 mmol/L — ABNORMAL LOW (ref 3.5–5.1)
Sodium: 141 mmol/L (ref 135–145)

## 2022-01-15 LAB — CBC
HCT: 38.4 % (ref 36.0–46.0)
Hemoglobin: 12.9 g/dL (ref 12.0–15.0)
MCH: 31.8 pg (ref 26.0–34.0)
MCHC: 33.6 g/dL (ref 30.0–36.0)
MCV: 94.6 fL (ref 80.0–100.0)
Platelets: 276 10*3/uL (ref 150–400)
RBC: 4.06 MIL/uL (ref 3.87–5.11)
RDW: 11.6 % (ref 11.5–15.5)
WBC: 7.1 10*3/uL (ref 4.0–10.5)
nRBC: 0 % (ref 0.0–0.2)

## 2022-01-15 LAB — TROPONIN I (HIGH SENSITIVITY): Troponin I (High Sensitivity): 14 ng/L (ref <18)

## 2022-01-15 MED ORDER — ALBUTEROL SULFATE (2.5 MG/3ML) 0.083% IN NEBU
2.5000 mg | INHALATION_SOLUTION | Freq: Once | RESPIRATORY_TRACT | Status: AC
  Administered 2022-01-15: 2.5 mg via RESPIRATORY_TRACT
  Filled 2022-01-15: qty 3

## 2022-01-15 MED ORDER — POTASSIUM CHLORIDE CRYS ER 20 MEQ PO TBCR
40.0000 meq | EXTENDED_RELEASE_TABLET | Freq: Once | ORAL | Status: AC
  Administered 2022-01-15: 40 meq via ORAL
  Filled 2022-01-15: qty 2

## 2022-01-15 NOTE — ED Triage Notes (Signed)
Patient BIB EMS from [place] and generealized weakness for 2 days. Patient  has hx of COPD and lung cancer. Patient o2 was 92% on room air. EMS placed patient on 2L for comfort.

## 2022-01-15 NOTE — ED Notes (Signed)
[facility] called to Transport patient back to [facility].

## 2022-01-15 NOTE — ED Provider Notes (Signed)
[facility] DEPT Provider Note   CSN: [id number] Arrival date & time: 01/15/22  0820     History  No chief complaint on file.   [name] is a 81 y.o. female.  HPI  81 year old female presents emergency department with complaints of shortness of breath.  EMS reports she has been short of breath and generalized weakness for 2 days.  Symptoms have been present for the past hour or so.  She is relatively poor historian stating she does not know who called EMS.  She does not currently feel short of breath or generally weak.  She states she is noncompliant with her medicines at home including her inhalers for COPD as well as oxygen she supposed to wear at baseline.  She lives at [place] [facility]. Denies fever, chills, night sweats, chest pain, cough, congestion, abdominal pain, N/V/D, urinary/vaginal symptoms, change in bowel habits.  Past Medical History:  Diagnosis Date   Acute delirium 07/21/2020   Allergic rhinitis    Anemia    Anxiety    Arthritis    all over   Bilateral cataracts    Chicken pox    Cholelithiasis 05/21/2014   On CT scan 2015    COPD (chronic obstructive pulmonary disease) (HCC)    Depression    hx ocd and anxiety in record as well   Gout    last time 6-57yrs ago    History of blood transfusion    Hot flashes    takes Prozac daily   Hx of migraines 90's   after menopause HA stopped   Hyperlipidemia    taking Flax Seed Oil and Fish Oil   Hypertension    takes Amlodipine nightly   IBS (irritable bowel syndrome)    Liver cyst    Macrocytosis    Osteopenia    takes Calcium and Vit D bid   Pneumonia    x 2 ;last time back in 1999   PONV (postoperative nausea and vomiting)    S/P right THA, AA - Dr. 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   Seasonal allergies    Smoking 09/12/2011  ] Past Surgical History:  Procedure Laterality Date   CARPAL TUNNEL RELEASE  80's   right    COLONOSCOPY     cyst  removed  >51yrs ago   from left leg   DILATION AND CURETTAGE OF UTERUS     at age 22   EXCISIONAL TOTAL KNEE ARTHROPLASTY Right 06/03/2014   Procedure: RIGHT KNEE SCAR EXCISION;  Surgeon: [name], MD;  Location: [facility];  Service: Orthopedics;  Laterality: Right;   INGUINAL HERNIA REPAIR  2000   INGUINAL HERNIA REPAIR Left 07/18/2020   Procedure: HERNIA  REPAIR INGUINAL ADULT REPAIR;  Surgeon: Michael Boston, MD;  Location: [facility];  Service: General;  Laterality: Left;   ROTATOR CUFF REPAIR  2001   right   TONSILLECTOMY  as a child   and adenoids   TOTAL ABDOMINAL HYSTERECTOMY  2003   benign tumor-[facility]   TOTAL HIP ARTHROPLASTY  2011   left   TOTAL HIP ARTHROPLASTY  03/07/2012   Procedure: TOTAL HIP ARTHROPLASTY ANTERIOR APPROACH;  Surgeon: [name], MD;  Location: [facility];  Service: Orthopedics;  Laterality: Right;   TOTAL KNEE ARTHROPLASTY Right 01/02/2013   Procedure: RIGHT TOTAL KNEE ARTHROPLASTY;  Surgeon: [name], MD;  Location: [facility];  Service: Orthopedics;  Laterality: Right;   TOTAL KNEE ARTHROPLASTY Left  04/03/2013   Procedure: LEFT TOTAL KNEE ARTHROPLASTY;  Surgeon: [name], MD;  Location: [facility];  Service: Orthopedics;  Laterality: Left;   VENTRAL HERNIA REPAIR N/A 07/18/2020   Procedure: LAPAROSCOPIC BILATERAL SPIGELIABN HERNIAS WITH MESH, UMBILICAL HERNIA;  Surgeon: Michael Boston, MD;  Location: [facility];  Service: General;  Laterality: N/A;   VIDEO BRONCHOSCOPY  10/20/2011   Procedure: VIDEO BRONCHOSCOPY;  Surgeon: Grace Isaac, MD;  Location: [facility];  Service: Thoracic;  Laterality: N/A;   WEDGE RESECTION  10-20-2011   rt lung  - for lung cancer      Home Medications Prior to Admission medications   Medication Sig Start Date End Date Taking? Authorizing Provider  acetaminophen (TYLENOL) 325 MG tablet Take 325 mg by mouth every 6 (six) hours as needed for moderate pain. Alternates with Ibuprofen    [provider]  amLODipine  (NORVASC) 5 MG tablet TAKE 1 TABLET BY MOUTH AT BEDTIME . APPOINTMENT REQUIRED FOR FUTURE REFILLS Patient not taking: Reported on 10/28/2021 11/19/20   Caren Macadam, MD  Budeson-Glycopyrrol-Formoterol (BREZTRI AEROSPHERE) 160-9-4.8 MCG/ACT AERO Inhale 2 puffs into the lungs 2 (two) times daily. 12/31/21   Sherwood Gambler, MD  budesonide-formoterol [facility]) 80-4.5 MCG/ACT inhaler Inhale 2 puffs into the lungs daily. 10/29/21   Shawna Clamp, MD  guaiFENesin (MUCINEX) 600 MG 12 hr tablet Take 1 tablet (600 mg total) by mouth 2 (two) times daily. 10/29/21   Shawna Clamp, MD  PROAIR HFA 108 [phone] Base) MCG/ACT inhaler INHALE 2 PUFFS BY MOUTH EVERY 6 HOURS AS NEEDED FOR WHEEZING AND FOR SHORTNESS OF BREATH 12/25/21   Sherwood Gambler, MD  Spacer/Aero-Holding Chambers (AEROCHAMBER PLUS) inhaler Use as instructed 03/20/21   Caren Macadam, MD      Allergies    Clindamycin/lincomycin, Penicillins, Spiriva [tiotropium bromide monohydrate], and Sulfa antibiotics    Review of Systems   Review of Systems  Constitutional:  Negative for chills and fever.  HENT:  Negative for ear pain and sore throat.   Eyes:  Negative for pain and visual disturbance.  Respiratory:  Positive for shortness of breath. Negative for cough.   Cardiovascular:  Negative for chest pain and palpitations.  Gastrointestinal:  Negative for abdominal pain and vomiting.  Genitourinary:  Negative for dysuria and hematuria.  Musculoskeletal:  Negative for arthralgias and back pain.  Skin:  Negative for color change and rash.  Neurological:  Negative for seizures and syncope.  All other systems reviewed and are negative.   Physical Exam Updated Vital Signs BP (!) 128/99   Pulse 82   Temp 98.7 F (37.1 C) (Oral)   Resp 18   Ht 5\' 4"  (1.626 m)   Wt 56.7 kg   SpO2 91%   BMI 21.46 kg/m  Physical Exam Vitals and nursing note reviewed.  Constitutional:      General: She is not in acute distress.    Appearance: Normal  appearance. She is well-developed. She is not ill-appearing, toxic-appearing or diaphoretic.  HENT:     Head: Normocephalic and atraumatic.     Nose: Nose normal.     Mouth/Throat:     Mouth: Mucous membranes are moist.     Pharynx: Oropharynx is clear.  Eyes:     Extraocular Movements: Extraocular movements intact.     Conjunctiva/sclera: Conjunctivae normal.  Cardiovascular:     Rate and Rhythm: Normal rate. Rhythm irregular.     Heart sounds: No murmur heard. Pulmonary:     Effort: Pulmonary effort  is normal. No tachypnea, bradypnea or respiratory distress.     Breath sounds: Normal breath sounds. No wheezing, rhonchi or rales.  Abdominal:     Palpations: Abdomen is soft.     Tenderness: There is no abdominal tenderness.  Musculoskeletal:        General: No swelling or tenderness.     Cervical back: Neck supple.     Right lower leg: No edema.     Left lower leg: No edema.  Skin:    General: Skin is warm and dry.     Capillary Refill: Capillary refill takes less than 2 seconds.  Neurological:     General: No focal deficit present.     Mental Status: She is alert and oriented to person, place, and time.  Psychiatric:        Mood and Affect: Mood normal.        Behavior: Behavior normal.     ED Results / Procedures / Treatments   Labs (all labs ordered are listed, but only abnormal results are displayed) Labs Reviewed  BASIC METABOLIC PANEL - Abnormal; Notable for the following components:      Result Value   Potassium 3.2 (*)    Glucose, Bld 105 (*)    Calcium 8.6 (*)    All other components within normal limits  CBC  TROPONIN I (HIGH SENSITIVITY)    EKG None  Radiology DG Chest Port 1 View  Result Date: 01/15/2022 CLINICAL DATA:  Shortness of breath and weakness. EXAM: PORTABLE CHEST 1 VIEW COMPARISON:  01/10/2022 FINDINGS: Stable heart size. 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 Lungs are mildly  hyperinflated. There is no evidence of pulmonary edema, consolidation, pneumothorax, nodule or pleural fluid. IMPRESSION: Hyperinflation and emphysema. Electronically Signed   By: Angela Edouard M.D.   On: 01/15/2022 09:26    Procedures Procedures    Medications Ordered in ED Medications  albuterol (PROVENTIL) (2.5 MG/3ML) 0.083% nebulizer solution 2.5 mg (2.5 mg Nebulization Given 01/15/22 0901)  potassium chloride SA (KLOR-CON M) CR tablet 40 mEq (40 mEq Oral Given 01/15/22 1002)    ED Course/ Medical Decision Making/ A&P                           Medical Decision Making Amount and/or Complexity of Data Reviewed Labs: ordered. Radiology: ordered.  Risk Prescription drug management.   This patient presents to the ED for concern of shortness of breath, this involves an extensive number of treatment options, and is a complaint that carries with it a high risk of complications and morbidity.  The differential diagnosis includes The causes for shortness of breath include but are not limited to Cardiac (AHF, pericardial effusion and tamponade, arrhythmias, ischemia, etc) Respiratory (COPD, asthma, pneumonia, pneumothorax, primary pulmonary hypertension, PE/VQ mismatch) Hematological (anemia) Neuromuscular (ALS, Guillain-Barr, etc)    Co morbidities that complicate the patient evaluation  Dementia, frequent visits for shortness of breath/chest pain, medical noncompliance, pretension, hyperlipidemia, history of tobacco use, lung cancer   Additional history obtained:  Additional history obtained from troponin from 01/10/2022 External records from outside source obtained and reviewed including troponin of 18.  Assessing patient's baseline   Lab Tests:  I Ordered, and personally interpreted labs.  The pertinent results include: Potassium 3.2   Imaging Studies ordered:  I ordered imaging studies including chest x-ray I independently visualized and interpreted imaging which showed  chronic lung changes including  emphysema-hyperinflation.  No acute abnormalities noted. I agree with the radiologist interpretation   Consultations Obtained:  N/a   Problem List / ED Course / Critical interventions / Medication management  Shortness of breath I ordered medication including albuterol  Reevaluation of the patient after these medicines showed that the patient improved I have reviewed the patients home medicines and have made adjustments as needed   Social Determinants of Health:  Memory care facility, medical noncompliance   Test / Admission - Considered:  Shortness of breath Vitals signs significant for hypertension with a blood pressure 130/97.  Patient baseline SPO2 94-95 on room air with talking.  With discussion, patient desats to 88 to 92% on room air.. Otherwise within normal range and stable throughout visit. Laboratory/imaging studies significant for: Potassium 3.2.  Patient supplemented in the ED with 40 mg of oral potassium.  Recommended outpatient implant supplement.  Patient also given information regarding Given overall negative work-up, patient's symptoms most likely due to medical noncompliance.  Discussion was had length with the patient regarding proper inhaler use as well as at home oxygen use.  She knowledge understanding.  Close follow-up with PCP regarding further explanation of prescribed therapy for shortness of breath recommended. Worrisome signs and symptoms were discussed with the patient, and the patient acknowledged understanding to return to the ED if noticed. Patient was stable upon discharge.          Final Clinical Impression(s) / ED Diagnoses Final diagnoses:  Shortness of breath    Rx / DC Orders ED Discharge Orders     None         Wilnette Kales, [place] 01/15/22 1019    Lucrezia Starch, MD 01/16/22 0730    Lucrezia Starch, MD 01/16/22 [phone]

## 2022-01-15 NOTE — Discharge Instructions (Addendum)
Please use your inhalers as prescribed.  Your Symbicort you are supposed to take 2 puffs into your lungs daily.  Your ProAir HFA you are supposed to take 2 puffs every 6 hours as needed for wheezing and shortness of breath.  Please use your at home oxygen as prescribed.  All 3 of these methods will help with your shortness of breath.  We noticed on one of your blood studies today that your potassium was slightly low.  I will attach information regarding potassium rich foods.  You may also consider picking up a potassium supplement from your local pharmacy.  Be reassured that your overall work-up today was negative.  Please do not hesitate to return to the emergency department if the worrisome signs and symptoms we discussed become apparent.

## 2022-01-19 DIAGNOSIS — E119 Type 2 diabetes mellitus without complications: Secondary | ICD-10-CM | POA: Diagnosis not present

## 2022-01-19 DIAGNOSIS — J449 Chronic obstructive pulmonary disease, unspecified: Secondary | ICD-10-CM | POA: Diagnosis not present

## 2022-01-19 DIAGNOSIS — I10 Essential (primary) hypertension: Secondary | ICD-10-CM | POA: Diagnosis not present

## 2022-01-19 DIAGNOSIS — F03B0 Unspecified dementia, moderate, without behavioral disturbance, psychotic disturbance, mood disturbance, and anxiety: Secondary | ICD-10-CM | POA: Diagnosis not present

## 2022-01-19 DIAGNOSIS — D649 Anemia, unspecified: Secondary | ICD-10-CM | POA: Diagnosis not present

## 2022-01-20 DIAGNOSIS — J449 Chronic obstructive pulmonary disease, unspecified: Secondary | ICD-10-CM | POA: Diagnosis not present

## 2022-01-20 DIAGNOSIS — I10 Essential (primary) hypertension: Secondary | ICD-10-CM | POA: Diagnosis not present

## 2022-01-20 DIAGNOSIS — F03B0 Unspecified dementia, moderate, without behavioral disturbance, psychotic disturbance, mood disturbance, and anxiety: Secondary | ICD-10-CM | POA: Diagnosis not present

## 2022-01-20 DIAGNOSIS — E119 Type 2 diabetes mellitus without complications: Secondary | ICD-10-CM | POA: Diagnosis not present

## 2022-01-20 DIAGNOSIS — D649 Anemia, unspecified: Secondary | ICD-10-CM | POA: Diagnosis not present

## 2022-01-21 DIAGNOSIS — I10 Essential (primary) hypertension: Secondary | ICD-10-CM | POA: Diagnosis not present

## 2022-01-21 DIAGNOSIS — F03B0 Unspecified dementia, moderate, without behavioral disturbance, psychotic disturbance, mood disturbance, and anxiety: Secondary | ICD-10-CM | POA: Diagnosis not present

## 2022-01-21 DIAGNOSIS — D649 Anemia, unspecified: Secondary | ICD-10-CM | POA: Diagnosis not present

## 2022-01-21 DIAGNOSIS — E119 Type 2 diabetes mellitus without complications: Secondary | ICD-10-CM | POA: Diagnosis not present

## 2022-01-21 DIAGNOSIS — J449 Chronic obstructive pulmonary disease, unspecified: Secondary | ICD-10-CM | POA: Diagnosis not present

## 2022-01-25 DIAGNOSIS — J449 Chronic obstructive pulmonary disease, unspecified: Secondary | ICD-10-CM | POA: Diagnosis not present

## 2022-01-25 DIAGNOSIS — E119 Type 2 diabetes mellitus without complications: Secondary | ICD-10-CM | POA: Diagnosis not present

## 2022-01-25 DIAGNOSIS — I10 Essential (primary) hypertension: Secondary | ICD-10-CM | POA: Diagnosis not present

## 2022-01-25 DIAGNOSIS — F03B0 Unspecified dementia, moderate, without behavioral disturbance, psychotic disturbance, mood disturbance, and anxiety: Secondary | ICD-10-CM | POA: Diagnosis not present

## 2022-01-25 DIAGNOSIS — D649 Anemia, unspecified: Secondary | ICD-10-CM | POA: Diagnosis not present

## 2022-01-26 DIAGNOSIS — F03B0 Unspecified dementia, moderate, without behavioral disturbance, psychotic disturbance, mood disturbance, and anxiety: Secondary | ICD-10-CM | POA: Diagnosis not present

## 2022-01-26 DIAGNOSIS — I10 Essential (primary) hypertension: Secondary | ICD-10-CM | POA: Diagnosis not present

## 2022-01-26 DIAGNOSIS — J449 Chronic obstructive pulmonary disease, unspecified: Secondary | ICD-10-CM | POA: Diagnosis not present

## 2022-01-26 DIAGNOSIS — D649 Anemia, unspecified: Secondary | ICD-10-CM | POA: Diagnosis not present

## 2022-01-26 DIAGNOSIS — E119 Type 2 diabetes mellitus without complications: Secondary | ICD-10-CM | POA: Diagnosis not present

## 2022-01-28 DIAGNOSIS — F03B0 Unspecified dementia, moderate, without behavioral disturbance, psychotic disturbance, mood disturbance, and anxiety: Secondary | ICD-10-CM | POA: Diagnosis not present

## 2022-01-28 DIAGNOSIS — J449 Chronic obstructive pulmonary disease, unspecified: Secondary | ICD-10-CM | POA: Diagnosis not present

## 2022-01-28 DIAGNOSIS — D649 Anemia, unspecified: Secondary | ICD-10-CM | POA: Diagnosis not present

## 2022-01-28 DIAGNOSIS — I10 Essential (primary) hypertension: Secondary | ICD-10-CM | POA: Diagnosis not present

## 2022-01-28 DIAGNOSIS — E119 Type 2 diabetes mellitus without complications: Secondary | ICD-10-CM | POA: Diagnosis not present

## 2022-02-02 DIAGNOSIS — E039 Hypothyroidism, unspecified: Secondary | ICD-10-CM | POA: Diagnosis not present

## 2022-02-02 DIAGNOSIS — R7303 Prediabetes: Secondary | ICD-10-CM | POA: Diagnosis not present

## 2022-02-02 DIAGNOSIS — E559 Vitamin D deficiency, unspecified: Secondary | ICD-10-CM | POA: Diagnosis not present

## 2022-02-02 DIAGNOSIS — D508 Other iron deficiency anemias: Secondary | ICD-10-CM | POA: Diagnosis not present

## 2022-02-02 DIAGNOSIS — D519 Vitamin B12 deficiency anemia, unspecified: Secondary | ICD-10-CM | POA: Diagnosis not present

## 2022-02-02 DIAGNOSIS — E785 Hyperlipidemia, unspecified: Secondary | ICD-10-CM | POA: Diagnosis not present

## 2022-02-04 DIAGNOSIS — E559 Vitamin D deficiency, unspecified: Secondary | ICD-10-CM | POA: Diagnosis not present

## 2022-02-04 DIAGNOSIS — D649 Anemia, unspecified: Secondary | ICD-10-CM | POA: Diagnosis not present

## 2022-02-04 DIAGNOSIS — I10 Essential (primary) hypertension: Secondary | ICD-10-CM | POA: Diagnosis not present

## 2022-02-04 DIAGNOSIS — E785 Hyperlipidemia, unspecified: Secondary | ICD-10-CM | POA: Diagnosis not present

## 2022-02-04 DIAGNOSIS — F32A Depression, unspecified: Secondary | ICD-10-CM | POA: Diagnosis not present

## 2022-02-07 ENCOUNTER — Emergency Department (HOSPITAL_COMMUNITY)
Admission: EM | Admit: 2022-02-07 | Discharge: 2022-02-07 | Disposition: A | Discharge status: Skilled Nursing Facility | Attending: Emergency Medicine | Admitting: Emergency Medicine

## 2022-02-07 ENCOUNTER — Encounter (HOSPITAL_COMMUNITY): Payer: Self-pay

## 2022-02-07 ENCOUNTER — Emergency Department (HOSPITAL_COMMUNITY)

## 2022-02-07 ENCOUNTER — Other Ambulatory Visit: Payer: Self-pay

## 2022-02-07 DIAGNOSIS — R0602 Shortness of breath: Secondary | ICD-10-CM | POA: Insufficient documentation

## 2022-02-07 DIAGNOSIS — R0789 Other chest pain: Secondary | ICD-10-CM | POA: Diagnosis not present

## 2022-02-07 DIAGNOSIS — M549 Dorsalgia, unspecified: Secondary | ICD-10-CM | POA: Diagnosis not present

## 2022-02-07 DIAGNOSIS — I499 Cardiac arrhythmia, unspecified: Secondary | ICD-10-CM | POA: Diagnosis not present

## 2022-02-07 DIAGNOSIS — I10 Essential (primary) hypertension: Secondary | ICD-10-CM | POA: Diagnosis not present

## 2022-02-07 DIAGNOSIS — I4891 Unspecified atrial fibrillation: Secondary | ICD-10-CM | POA: Diagnosis not present

## 2022-02-07 DIAGNOSIS — Z743 Need for continuous supervision: Secondary | ICD-10-CM | POA: Diagnosis not present

## 2022-02-07 DIAGNOSIS — Z79899 Other long term (current) drug therapy: Secondary | ICD-10-CM | POA: Insufficient documentation

## 2022-02-07 DIAGNOSIS — J432 Centrilobular emphysema: Secondary | ICD-10-CM | POA: Diagnosis not present

## 2022-02-07 DIAGNOSIS — Z7401 Bed confinement status: Secondary | ICD-10-CM | POA: Diagnosis not present

## 2022-02-07 DIAGNOSIS — M2550 Pain in unspecified joint: Secondary | ICD-10-CM | POA: Diagnosis not present

## 2022-02-07 LAB — LACTIC ACID, PLASMA
Lactic Acid, Venous: 0.9 mmol/L (ref 0.5–1.9)
Lactic Acid, Venous: 0.9 mmol/L (ref 0.5–1.9)

## 2022-02-07 LAB — CBC WITH DIFFERENTIAL/PLATELET
Abs Immature Granulocytes: 0.04 10*3/uL (ref 0.00–0.07)
Basophils Absolute: 0.1 10*3/uL (ref 0.0–0.1)
Basophils Relative: 1 %
Eosinophils Absolute: 0 10*3/uL (ref 0.0–0.5)
Eosinophils Relative: 0 %
HCT: 43.5 % (ref 36.0–46.0)
Hemoglobin: 14.7 g/dL (ref 12.0–15.0)
Immature Granulocytes: 1 %
Lymphocytes Relative: 8 %
Lymphs Abs: 0.7 10*3/uL (ref 0.7–4.0)
MCH: 31.4 pg (ref 26.0–34.0)
MCHC: 33.8 g/dL (ref 30.0–36.0)
MCV: 92.9 fL (ref 80.0–100.0)
Monocytes Absolute: 0.7 10*3/uL (ref 0.1–1.0)
Monocytes Relative: 8 %
Neutro Abs: 7.1 10*3/uL (ref 1.7–7.7)
Neutrophils Relative %: 82 %
Platelets: 152 10*3/uL (ref 150–400)
RBC: 4.68 MIL/uL (ref 3.87–5.11)
RDW: 12.3 % (ref 11.5–15.5)
WBC: 8.6 10*3/uL (ref 4.0–10.5)
nRBC: 0 % (ref 0.0–0.2)

## 2022-02-07 LAB — COMPREHENSIVE METABOLIC PANEL
ALT: 9 U/L (ref 0–44)
AST: 10 U/L — ABNORMAL LOW (ref 15–41)
Albumin: 3.7 g/dL (ref 3.5–5.0)
Alkaline Phosphatase: 67 U/L (ref 38–126)
Anion gap: 7 (ref 5–15)
BUN: 11 mg/dL (ref 8–23)
CO2: 27 mmol/L (ref 22–32)
Calcium: 9.1 mg/dL (ref 8.9–10.3)
Chloride: 102 mmol/L (ref 98–111)
Creatinine, Ser: 0.73 mg/dL (ref 0.44–1.00)
GFR, Estimated: >60 mL/min (ref >60)
Glucose, Bld: 113 mg/dL — ABNORMAL HIGH (ref 70–99)
Potassium: 3.9 mmol/L (ref 3.5–5.1)
Sodium: 136 mmol/L (ref 135–145)
Total Bilirubin: 1.7 mg/dL — ABNORMAL HIGH (ref 0.3–1.2)
Total Protein: 6.9 g/dL (ref 6.5–8.1)

## 2022-02-07 LAB — TROPONIN I (HIGH SENSITIVITY)
Troponin I (High Sensitivity): 17 ng/L (ref <18)
Troponin I (High Sensitivity): 16 ng/L (ref <18)

## 2022-02-07 MED ORDER — IOHEXOL 350 MG/ML SOLN
75.0000 mL | Freq: Once | INTRAVENOUS | Status: AC | PRN
  Administered 2022-02-07: 75 mL via INTRAVENOUS

## 2022-02-07 MED ORDER — ACETAMINOPHEN 325 MG PO TABS
650.0000 mg | ORAL_TABLET | Freq: Once | ORAL | Status: AC
  Administered 2022-02-07: 650 mg via ORAL
  Filled 2022-02-07: qty 2

## 2022-02-07 NOTE — ED Triage Notes (Signed)
Pt BIB EMS from [place] at [facility] and lower back pain. Hx dementia and copd

## 2022-02-07 NOTE — ED Notes (Signed)
Started at [facility]

## 2022-02-07 NOTE — ED Provider Notes (Incomplete)
[name] is a 81 y.o. female with a past medical history significant for COPD intermittently on home oxygen, hypertension, hyperlipidemia, and history of lung cancer who presents with chest pain and shortness of breath.  Going to patient, for the last few days she has had right-sided chest discomfort that is worse with deep breathing.  She reports no cough but has had some shortness of breath and fatigue.  She denies any fevers, chills, nausea, vomiting, constipation, diarrhea, or urinary changes.  No leg pain or leg swelling.  No trauma reported.  No rashes reported.  Patient otherwise resting.  Per EMS, oxygen saturations were 90% and she was placed on oxygen however now that she is in the exam bed her oxygen saturations are in the low 90s off of oxygen.  On my exam, lungs did have some coarseness on the right side but chest was nontender.  Back nontender.  Abdomen nontender.  Slight murmur but otherwise pulses intact in extremities.  Due to her history of previous cancer with this pleuritic chest discomfort and shortness of breath I do feel need to rule out a pulmonary embolism as etiology of symptoms.  We will get a CT PE study and get chest x-ray initially to look for pneumothorax.  We will get a cardiac work-up as well and monitor her breathing.  If work-up is reassuring, patient may be stable for discharge home

## 2022-02-07 NOTE — Progress Notes (Signed)
[facility] Liaison Note  This patient is active with [facility], ED provider notes reviewed, acknowledge patient returning to facility with hospice service.  Please call with any hospice related questions   Thank you Bea Laura MSN RN [facility] Liaison [phone]

## 2022-02-07 NOTE — ED Notes (Signed)
Patient refused ambulating in room to check Pulse Oximetry.

## 2022-02-07 NOTE — Discharge Instructions (Addendum)
We discussed that your labs and imaging were not concerning for pneumonia, infection, or pulmonary embolism and that we believe you are safe for discharge to your care facility. 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 Please rest and stay hydrated. Please return to the ED if you develop new or worsening symptoms.

## 2022-02-07 NOTE — ED Provider Notes (Signed)
[facility] DEPT Provider Note   CSN: [id number] Arrival date & time: 02/07/22  0900     History  Chief Complaint  Patient presents with   Shortness of Breath    [name] is a 81 y.o. female with history of dementia, a-fib, COPD (not on home O2), and lung cancer that presents from her nursing facility with SOB and right-sided chest pain that began a few days ago. The patient states that her chest pain is 6/10 and describes it as "tightness". The patient states that the SOB and chest pain worsens with standing up. The patient states that she has oxygen at her nursing facility that she does not use regularly unless needed. EMS states that the patient had an O2 saturation >90% on RA upon arrival which improved to 98% on 4L. The patient was not given a breathing treatment in route. The patient is also complaining of chronic lower back pain. No other transforming factors. The patient denies having recent sick contacts, fever, chills, palpitations, abdominal pain, diarrhea, nausea, vomiting. The patient is unsure of the medications she is taking and of any allergies.   The history is provided by the patient and the EMS personnel.  Shortness of Breath Associated symptoms: chest pain   Associated symptoms: no abdominal pain, no fever and no vomiting        Home Medications Prior to Admission medications   Medication Sig Start Date End Date Taking? Authorizing Provider  acetaminophen (TYLENOL) 325 MG tablet Take 325 mg by mouth every 6 (six) hours as needed for moderate pain. Alternates with Ibuprofen    [provider]  amLODipine (NORVASC) 5 MG tablet TAKE 1 TABLET BY MOUTH AT BEDTIME . APPOINTMENT REQUIRED FOR FUTURE REFILLS Patient not taking: Reported on 10/28/2021 11/19/20   Caren Macadam, MD  Budeson-Glycopyrrol-Formoterol (BREZTRI AEROSPHERE) 160-9-4.8 MCG/ACT AERO Inhale 2 puffs into the lungs 2 (two) times daily. 12/31/21    Sherwood Gambler, MD  budesonide-formoterol [facility]) 80-4.5 MCG/ACT inhaler Inhale 2 puffs into the lungs daily. 10/29/21   Shawna Clamp, MD  guaiFENesin (MUCINEX) 600 MG 12 hr tablet Take 1 tablet (600 mg total) by mouth 2 (two) times daily. 10/29/21   Shawna Clamp, MD  PROAIR HFA 108 [phone] Base) MCG/ACT inhaler INHALE 2 PUFFS BY MOUTH EVERY 6 HOURS AS NEEDED FOR WHEEZING AND FOR SHORTNESS OF BREATH 12/25/21   Sherwood Gambler, MD  Spacer/Aero-Holding Chambers (AEROCHAMBER PLUS) inhaler Use as instructed 03/20/21   Caren Macadam, MD      Allergies    Clindamycin/lincomycin, Penicillins, Spiriva [tiotropium bromide monohydrate], and Sulfa antibiotics    Review of Systems   Review of Systems  Constitutional:  Negative for chills and fever.  Respiratory:  Positive for shortness of breath.   Cardiovascular:  Positive for chest pain. Negative for palpitations.  Gastrointestinal:  Negative for abdominal pain, diarrhea, nausea and vomiting.  Musculoskeletal:  Positive for back pain.    Physical Exam Updated Vital Signs BP (!) 147/94 (BP Location: Left Arm)   Pulse 98   Temp 99.5 F (37.5 C) (Oral)   Resp (!) 22   Ht 5\' 4"  (1.626 m)   Wt 56 kg   SpO2 96%   BMI 21.19 kg/m  Physical Exam Constitutional:      General: She is not in acute distress. Cardiovascular:     Rate and Rhythm: Normal rate and regular rhythm.     Pulses: Normal pulses.  Pulmonary:     Effort:  Pulmonary effort is normal.     Breath sounds: No wheezing, rhonchi or rales.     Comments: Diminished breath sounds in the right upper lobe.  Abdominal:     Tenderness: There is no abdominal tenderness.  Musculoskeletal:        General: Normal range of motion.     Comments: No tenderness to palpation of the spine.      ED Results / Procedures / Treatments   Labs (all labs ordered are listed, but only abnormal results are displayed) Labs Reviewed  CBC WITH DIFFERENTIAL/PLATELET  COMPREHENSIVE METABOLIC  PANEL  LACTIC ACID, PLASMA  LACTIC ACID, PLASMA  TROPONIN I (HIGH SENSITIVITY)    EKG EKG Interpretation  Date/Time:  Sunday February 07 2022 09:16:04 EDT Ventricular Rate:  96 PR Interval:  145 QRS Duration: 85 QT Interval:  342 QTC Calculation: 433 R Axis:   -82 Text Interpretation: Sinus tachycardia Multiform ventricular premature complexes LAD, consider left anterior fascicular block Low voltage, extremity leads RSR' in V1 or V2, right VCD or RVH Consider anterior infarct when compared to prior, similar tp prior with more PVC. No STEMI Confirmed by Antony Blackbird [phone]) on 02/07/2022 9:22:20 AM  Radiology No results found.  Procedures Procedures    Medications Ordered in ED Medications - No data to display  ED Course/ Medical Decision Making/ A&P                           Medical Decision Making Amount and/or Complexity of Data Reviewed Labs: ordered. Radiology: ordered.  Risk OTC drugs. Prescription drug management.   The patient is a 81 y.o. female with history of dementia, a-fib, COPD (not on home O2), and lung cancer that presents from her nursing facility with SOB and right-sided chest pain that began a few days ago. Will order CXR and lactic acid to assess for potential pneumonia. Will order CT angio chest to assess for potential pulmonary embolism. Will order troponin, EKG, and cardiac monitoring to assess for potential MI. Will order CBC and CMP to assess for other causes of SOB. Will ambulate with pulse ox to assess for decreasing O2 saturation. The patient agrees with the plan. Patient discussed with Dr. Sherry Ruffing.   11:03 AM  Patient reassessed and is still complaining of chest pain and back pain. Will order tylenol and reassess.   1:02 PM  The patient states that her chest pain and back pain have resolved after receiving tylenol. Patients oxygen saturation is 93% on RA. Discussed with patient the results of her imaging and labs. Discussed with patient plan to  discharge. Patient agrees with the plan.         Final Clinical Impression(s) / ED Diagnoses Final diagnoses:  None    Rx / DC Orders ED Discharge Orders     None         [place], Claudia Desanctis, MD 02/07/22 [place], Gwenyth Allegra, MD 02/11/22 1642

## 2022-02-18 ENCOUNTER — Telehealth: Payer: Self-pay | Admitting: Family Medicine

## 2022-02-18 NOTE — Telephone Encounter (Signed)
Left message for patient to call back and schedule Medicare Annual Wellness Visit (AWV) either virtually or in office. Left  my Herbie Drape number [phone]   Last AWV ;02/19/21  please schedule at anytime with [facility] Nurse Health Advisor 1 or 2

## 2022-03-22 NOTE — Telephone Encounter (Signed)
error 

## 2022-03-24 ENCOUNTER — Telehealth: Payer: Self-pay | Admitting: Family Medicine

## 2022-03-24 NOTE — Telephone Encounter (Signed)
Left message for patient to call back and schedule Medicare Annual Wellness Visit (AWV) either virtually or in office. Left  my Herbie Drape number [phone]   Last AWV ;02/19/21  please schedule at anytime with [facility] Nurse Health Advisor 1 or 2

## 2022-04-09 DEATH — deceased

## 2022-05-06 ENCOUNTER — Telehealth: Payer: Self-pay | Admitting: Family Medicine

## 2022-05-06 NOTE — Telephone Encounter (Signed)
LVM for patient to call and schedule with new provider or let us know if she has changed offices     FYI

## 2022-08-04 IMAGING — CT CT CHEST W/O CM
2 of 4 series · 15 of 36 positions shown, 18 images · non-contrast
Comparison: Multiple exams, including chest CT 06/25/2019

CLINICAL DATA: COPD and hypoxia.

EXAM:
CT CHEST WITHOUT CONTRAST
TECHNIQUE: Multidetector CT imaging of the chest was performed following the
standard protocol without IV contrast.

[Series 2: thorax · axial · 0.61mm/px · z∈[-253,+3]mm · 12 of 152 slices shown, 15 images]
[im 12/152  mediastinal]
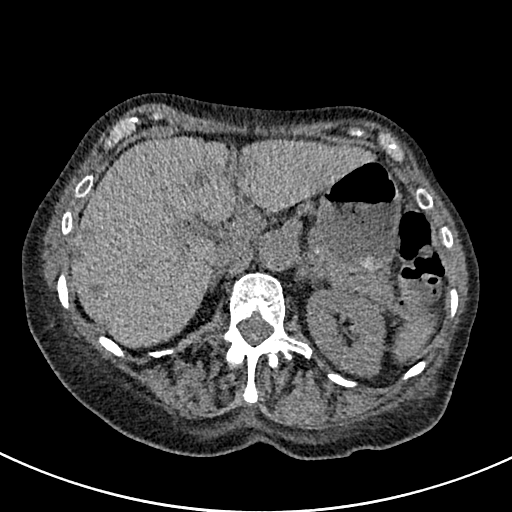
[im 12/152  lung]
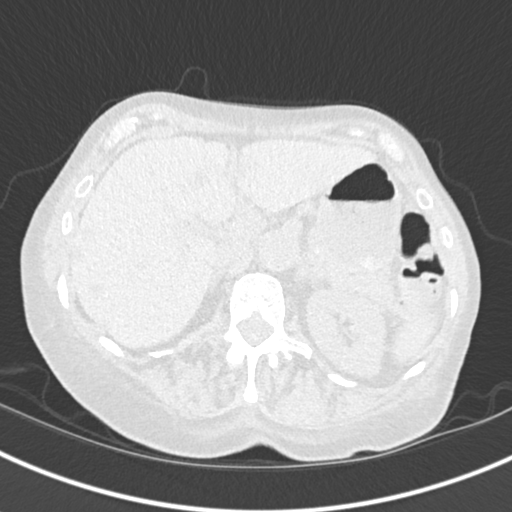
[im 24/152  lung]
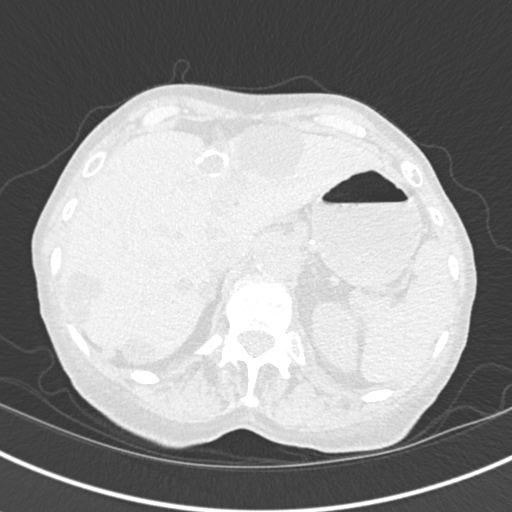
[im 35/152  lung]
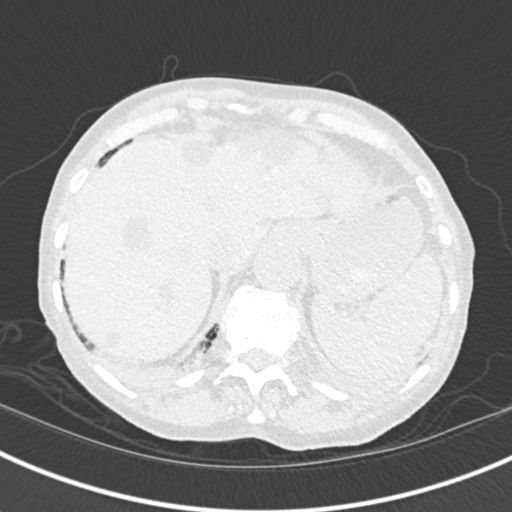
[im 47/152  lung]
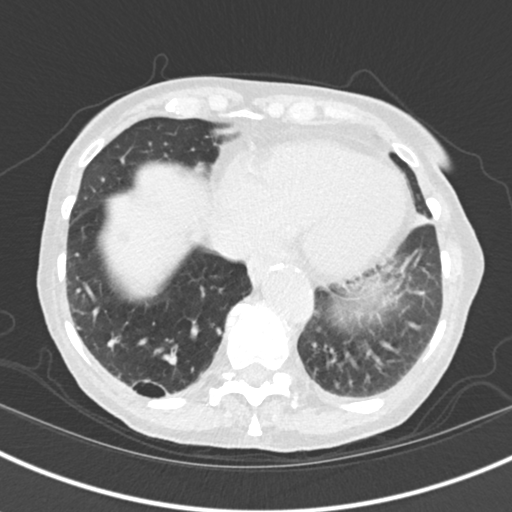
[im 59/152  mediastinal]
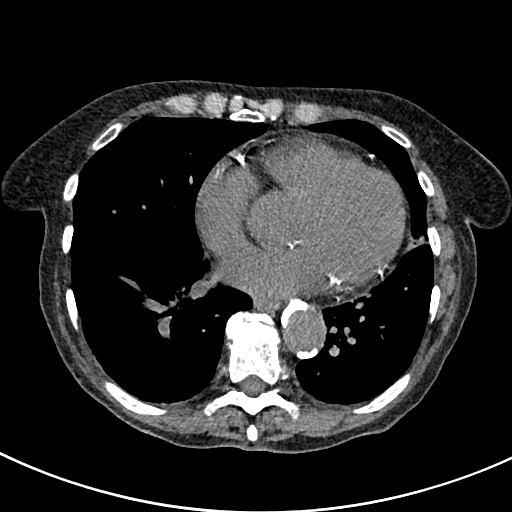
[im 59/152  lung]
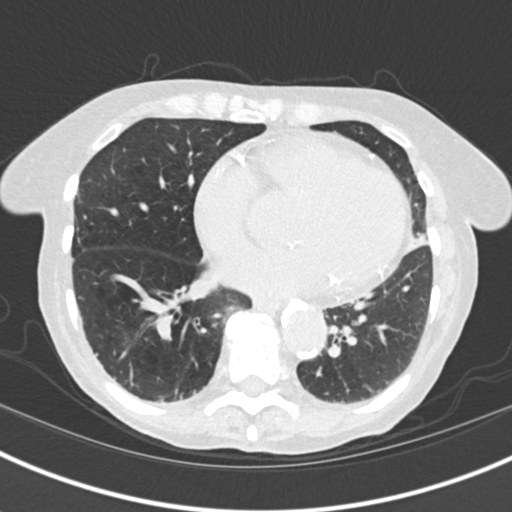
[im 70/152  lung]
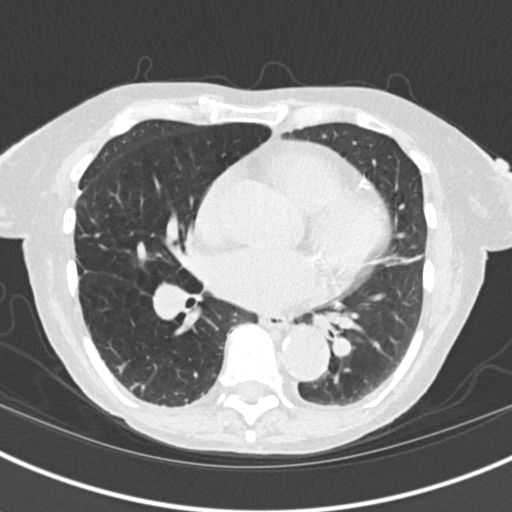
[im 82/152  lung]
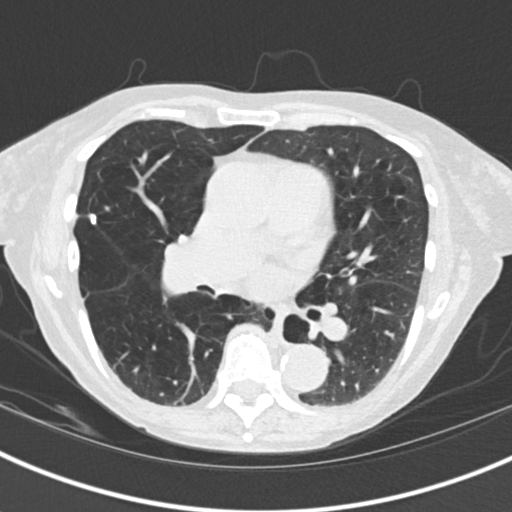
[im 93/152  lung]
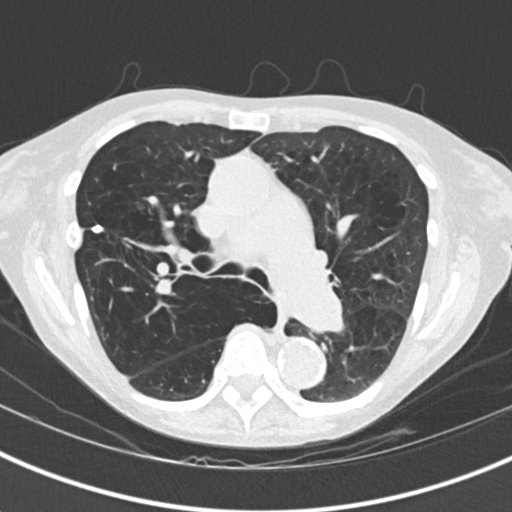
[im 105/152  mediastinal]
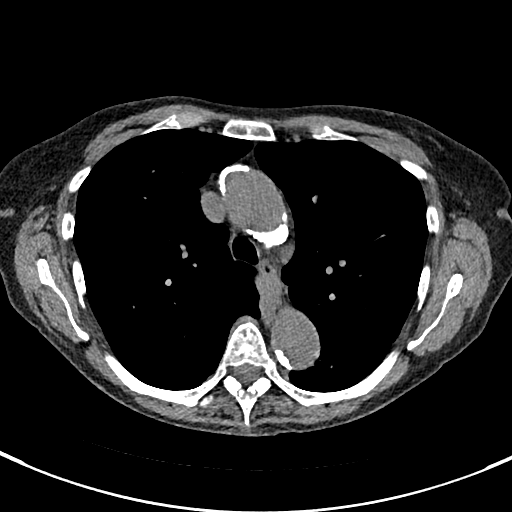
[im 105/152  lung]
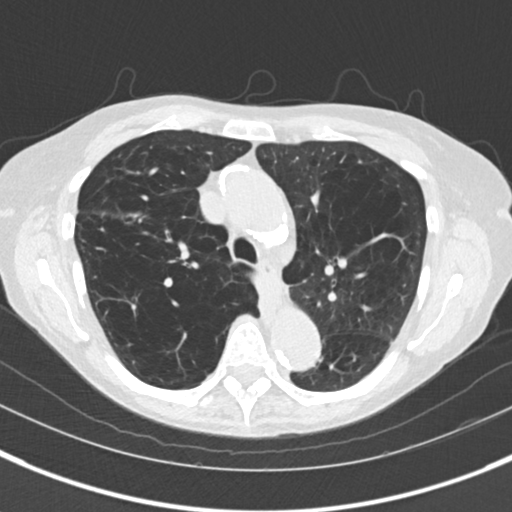
[im 117/152  lung]
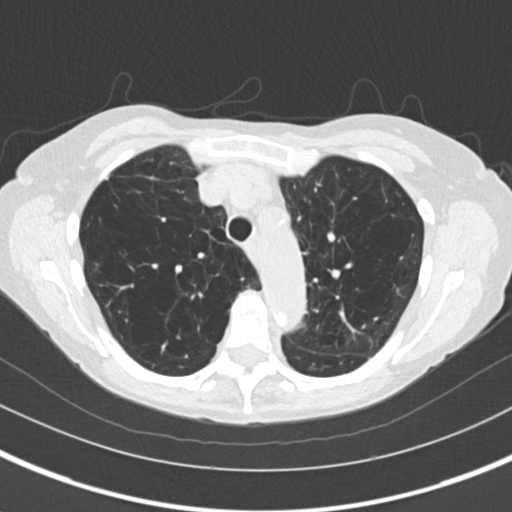
[im 128/152  lung]
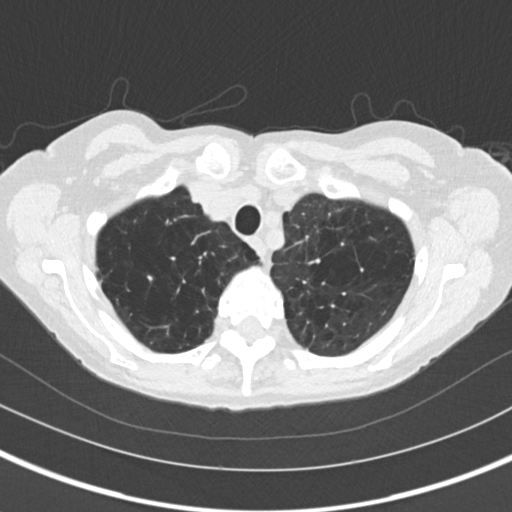
[im 140/152  lung]
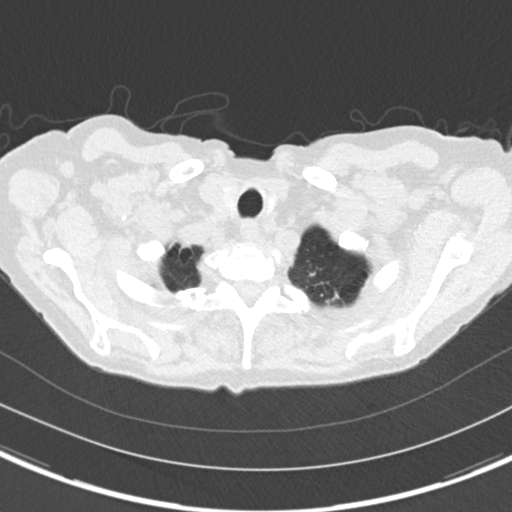

[Series 5: coronal · coronal · 0.54mm/px · 3 of 105 slices shown]
[im 21/105  lung]
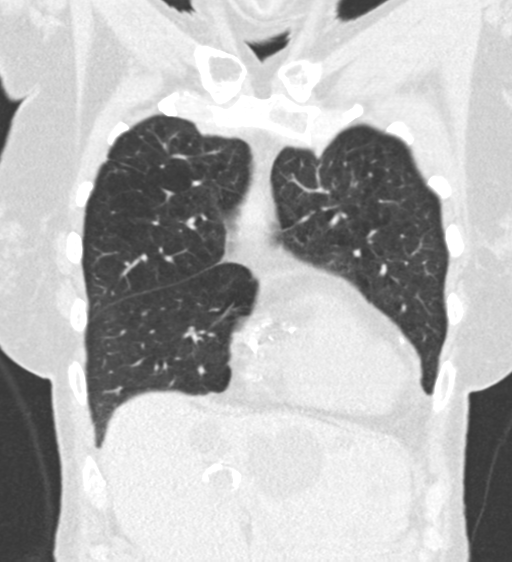
[im 42/105  lung]
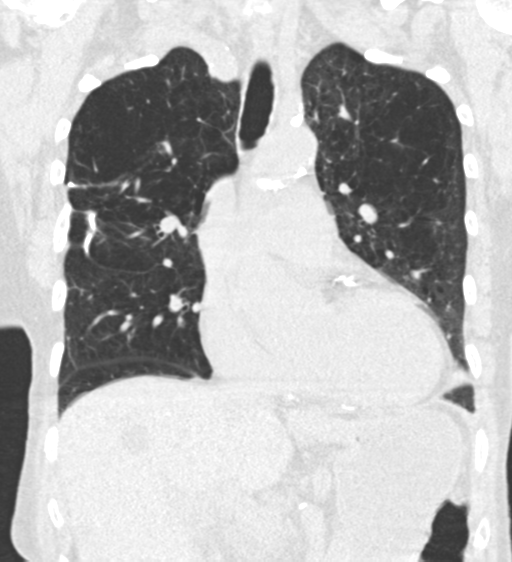
[im 63/105  lung]
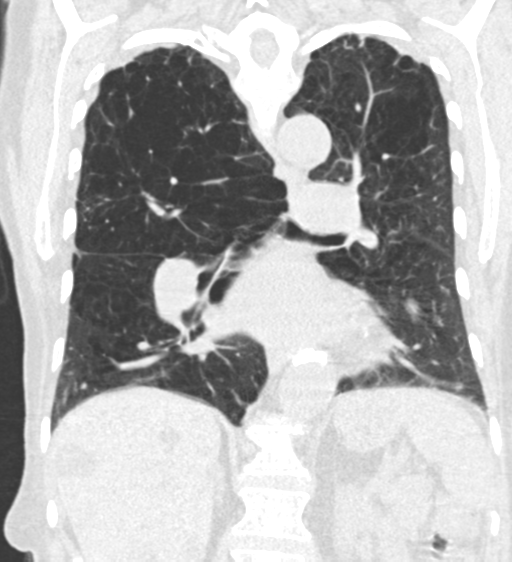

[15 of 36 positions shown; findings below may reference images not displayed]

FINDINGS: Cardiovascular: Coronary, aortic arch, and branch vessel
atherosclerotic vascular disease. Mild cardiomegaly. Mildly
prominent main pulmonary artery as can be seen in pulmonary arterial
hypertension.

Mediastinum/Nodes: No pathologic adenopathy is identified.

Lungs/Pleura: Severe emphysema. Stable appearance of the wedge
resection site anteriorly in the right upper lobe. Mild airway
thickening. Stable mild biapical pleuroparenchymal scarring. Stable
scarring posteriorly in the lingula. Mild airway plugging in the
left lower lobe for example on image 98 of series 3.

Upper Abdomen: Numerous hypodense hepatic lesions favoring cysts. 1
of these in segment 4 demonstrates chronic rim calcification.
Enhancement characteristics of the hepatic lesions are not
interrogated.

Musculoskeletal: Degenerative glenohumeral arthropathy, right
greater than left.

Interval deformity of the right anterior third and fourth ribs near
the costochondral junction compatible with healing rib fractures.
Thoracic spondylosis.
IMPRESSION: 1. Severe emphysema. Airway thickening with mild airway plugging in
the left lower lobe.
2. Stable appearance of the wedge resection site anteriorly in the
right upper lobe.
3. Coronary, aortic arch, and branch vessel atherosclerotic vascular
disease. Mild cardiomegaly.
4. Mildly prominent main pulmonary artery as can be seen in
pulmonary arterial hypertension.
5. Numerous hypodense hepatic lesions favoring cysts. Enhancement
characteristics of the hepatic lesions are not interrogated.
6. Interval deformity of the right anterior third and fourth ribs
near the costochondral junction compatible with healing rib
fractures.
7. Degenerative glenohumeral arthropathy, right greater than left.
8. Emphysema and aortic atherosclerosis.

Aortic Atherosclerosis ([id number].T) and Emphysema ([id number].1).
